# Patient Record
Sex: Female | Born: 1980 | Race: Black or African American | Hispanic: No | Marital: Married | Smoking: Current every day smoker
Health system: Southern US, Community
[De-identification: ages and names within clinical notes are randomized; demographics above are authoritative.]

## PROBLEM LIST (undated history)

## (undated) ENCOUNTER — Inpatient Hospital Stay (HOSPITAL_COMMUNITY): Payer: Self-pay

## (undated) DIAGNOSIS — Z5189 Encounter for other specified aftercare: Secondary | ICD-10-CM

## (undated) DIAGNOSIS — D563 Thalassemia minor: Secondary | ICD-10-CM

## (undated) DIAGNOSIS — O09529 Supervision of elderly multigravida, unspecified trimester: Secondary | ICD-10-CM

## (undated) DIAGNOSIS — J069 Acute upper respiratory infection, unspecified: Secondary | ICD-10-CM

## (undated) DIAGNOSIS — Z789 Other specified health status: Secondary | ICD-10-CM

## (undated) DIAGNOSIS — R002 Palpitations: Secondary | ICD-10-CM

## (undated) DIAGNOSIS — N390 Urinary tract infection, site not specified: Secondary | ICD-10-CM

## (undated) DIAGNOSIS — R000 Tachycardia, unspecified: Secondary | ICD-10-CM

## (undated) DIAGNOSIS — D649 Anemia, unspecified: Secondary | ICD-10-CM

## (undated) HISTORY — DX: Tachycardia, unspecified: R00.0

## (undated) HISTORY — DX: Palpitations: R00.2

## (undated) HISTORY — PX: FOOT SURGERY: SHX648

## (undated) HISTORY — DX: Urinary tract infection, site not specified: N39.0

---
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
## 1998-02-04 ENCOUNTER — Encounter: Admission: RE | Admit: 1998-02-04 | Discharge: 1998-02-04 | Payer: Self-pay | Admitting: Family Medicine

## 1998-02-05 ENCOUNTER — Encounter: Admission: RE | Admit: 1998-02-05 | Discharge: 1998-02-05 | Payer: Self-pay | Admitting: Family Medicine

## 1998-02-09 ENCOUNTER — Encounter: Admission: RE | Admit: 1998-02-09 | Discharge: 1998-02-09 | Payer: Self-pay | Admitting: Family Medicine

## 1998-02-18 ENCOUNTER — Inpatient Hospital Stay (HOSPITAL_COMMUNITY): Admission: AD | Admit: 1998-02-18 | Discharge: 1998-02-27 | Payer: Self-pay | Admitting: Obstetrics

## 1998-02-18 ENCOUNTER — Encounter: Admission: RE | Admit: 1998-02-18 | Discharge: 1998-02-18 | Payer: Self-pay | Admitting: Family Medicine

## 1998-03-04 ENCOUNTER — Encounter: Admission: RE | Admit: 1998-03-04 | Discharge: 1998-03-04 | Payer: Self-pay | Admitting: Family Medicine

## 1998-03-04 ENCOUNTER — Encounter: Admission: RE | Admit: 1998-03-04 | Discharge: 1998-06-02 | Payer: Self-pay | Admitting: Obstetrics & Gynecology

## 1998-04-17 ENCOUNTER — Encounter: Admission: RE | Admit: 1998-04-17 | Discharge: 1998-04-17 | Payer: Self-pay | Admitting: Family Medicine

## 1998-05-13 ENCOUNTER — Encounter: Admission: RE | Admit: 1998-05-13 | Discharge: 1998-05-13 | Payer: Self-pay | Admitting: Family Medicine

## 1998-06-03 ENCOUNTER — Encounter: Admission: RE | Admit: 1998-06-03 | Discharge: 1998-06-03 | Payer: Self-pay | Admitting: Family Medicine

## 1998-06-18 ENCOUNTER — Encounter: Admission: RE | Admit: 1998-06-18 | Discharge: 1998-06-18 | Payer: Self-pay | Admitting: Family Medicine

## 1998-07-20 ENCOUNTER — Encounter: Admission: RE | Admit: 1998-07-20 | Discharge: 1998-07-20 | Payer: Self-pay | Admitting: Family Medicine

## 1998-07-27 ENCOUNTER — Encounter: Admission: RE | Admit: 1998-07-27 | Discharge: 1998-07-27 | Payer: Self-pay | Admitting: Family Medicine

## 1998-07-29 ENCOUNTER — Encounter: Admission: RE | Admit: 1998-07-29 | Discharge: 1998-07-29 | Payer: Self-pay | Admitting: Family Medicine

## 1998-10-12 ENCOUNTER — Encounter: Admission: RE | Admit: 1998-10-12 | Discharge: 1998-10-12 | Payer: Self-pay | Admitting: Family Medicine

## 1998-12-01 ENCOUNTER — Encounter: Admission: RE | Admit: 1998-12-01 | Discharge: 1998-12-01 | Payer: Self-pay | Admitting: Family Medicine

## 1998-12-14 ENCOUNTER — Encounter: Admission: RE | Admit: 1998-12-14 | Discharge: 1998-12-14 | Payer: Self-pay | Admitting: Sports Medicine

## 1998-12-31 ENCOUNTER — Encounter: Admission: RE | Admit: 1998-12-31 | Discharge: 1998-12-31 | Payer: Self-pay | Admitting: Family Medicine

## 1999-01-07 ENCOUNTER — Encounter: Admission: RE | Admit: 1999-01-07 | Discharge: 1999-01-07 | Payer: Self-pay | Admitting: Family Medicine

## 1999-03-12 ENCOUNTER — Encounter: Admission: RE | Admit: 1999-03-12 | Discharge: 1999-03-12 | Payer: Self-pay | Admitting: Family Medicine

## 1999-03-22 ENCOUNTER — Encounter: Admission: RE | Admit: 1999-03-22 | Discharge: 1999-03-22 | Payer: Self-pay | Admitting: Family Medicine

## 1999-06-01 ENCOUNTER — Encounter: Admission: RE | Admit: 1999-06-01 | Discharge: 1999-06-01 | Payer: Self-pay | Admitting: Family Medicine

## 1999-06-11 ENCOUNTER — Encounter: Admission: RE | Admit: 1999-06-11 | Discharge: 1999-06-11 | Payer: Self-pay | Admitting: Family Medicine

## 1999-11-09 ENCOUNTER — Encounter: Admission: RE | Admit: 1999-11-09 | Discharge: 1999-11-09 | Payer: Self-pay | Admitting: Sports Medicine

## 1999-11-17 ENCOUNTER — Emergency Department (HOSPITAL_COMMUNITY): Admission: EM | Admit: 1999-11-17 | Discharge: 1999-11-17 | Payer: Self-pay | Admitting: Emergency Medicine

## 1999-12-06 ENCOUNTER — Emergency Department (HOSPITAL_COMMUNITY): Admission: EM | Admit: 1999-12-06 | Discharge: 1999-12-06 | Payer: Self-pay | Admitting: Emergency Medicine

## 2000-12-24 ENCOUNTER — Emergency Department (HOSPITAL_COMMUNITY): Admission: EM | Admit: 2000-12-24 | Discharge: 2000-12-24 | Payer: Self-pay | Admitting: Emergency Medicine

## 2000-12-25 ENCOUNTER — Encounter: Admission: RE | Admit: 2000-12-25 | Discharge: 2000-12-25 | Payer: Self-pay | Admitting: Family Medicine

## 2003-07-28 ENCOUNTER — Emergency Department (HOSPITAL_COMMUNITY): Admission: EM | Admit: 2003-07-28 | Discharge: 2003-07-28 | Payer: Self-pay | Admitting: Emergency Medicine

## 2004-04-04 ENCOUNTER — Inpatient Hospital Stay (HOSPITAL_COMMUNITY): Admission: AD | Admit: 2004-04-04 | Discharge: 2004-04-04 | Payer: Self-pay | Admitting: *Deleted

## 2004-04-09 ENCOUNTER — Encounter: Admission: RE | Admit: 2004-04-09 | Discharge: 2004-04-09 | Payer: Self-pay | Admitting: Sports Medicine

## 2004-04-12 ENCOUNTER — Encounter: Admission: RE | Admit: 2004-04-12 | Discharge: 2004-04-12 | Payer: Self-pay | Admitting: Family Medicine

## 2004-04-15 ENCOUNTER — Inpatient Hospital Stay (HOSPITAL_COMMUNITY): Admission: AD | Admit: 2004-04-15 | Discharge: 2004-04-15 | Payer: Self-pay | Admitting: Obstetrics & Gynecology

## 2004-04-26 ENCOUNTER — Inpatient Hospital Stay (HOSPITAL_COMMUNITY): Admission: AD | Admit: 2004-04-26 | Discharge: 2004-04-26 | Payer: Self-pay | Admitting: Obstetrics and Gynecology

## 2004-04-27 ENCOUNTER — Encounter: Admission: RE | Admit: 2004-04-27 | Discharge: 2004-04-27 | Payer: Self-pay | Admitting: Family Medicine

## 2004-05-04 ENCOUNTER — Encounter: Admission: RE | Admit: 2004-05-04 | Discharge: 2004-05-04 | Payer: Self-pay | Admitting: Sports Medicine

## 2004-05-04 ENCOUNTER — Encounter (INDEPENDENT_AMBULATORY_CARE_PROVIDER_SITE_OTHER): Payer: Self-pay | Admitting: Specialist

## 2004-06-02 ENCOUNTER — Encounter: Admission: RE | Admit: 2004-06-02 | Discharge: 2004-06-02 | Payer: Self-pay | Admitting: Family Medicine

## 2004-06-16 ENCOUNTER — Ambulatory Visit (HOSPITAL_COMMUNITY): Admission: RE | Admit: 2004-06-16 | Discharge: 2004-06-16 | Payer: Self-pay | Admitting: Family Medicine

## 2004-06-17 ENCOUNTER — Encounter: Admission: RE | Admit: 2004-06-17 | Discharge: 2004-06-17 | Payer: Self-pay | Admitting: Sports Medicine

## 2004-07-10 ENCOUNTER — Observation Stay (HOSPITAL_COMMUNITY): Admission: AD | Admit: 2004-07-10 | Discharge: 2004-07-11 | Payer: Self-pay | Admitting: Obstetrics and Gynecology

## 2004-07-10 ENCOUNTER — Ambulatory Visit: Payer: Self-pay | Admitting: Obstetrics and Gynecology

## 2004-07-14 ENCOUNTER — Ambulatory Visit: Payer: Self-pay | Admitting: Family Medicine

## 2004-08-03 ENCOUNTER — Inpatient Hospital Stay (HOSPITAL_COMMUNITY): Admission: AD | Admit: 2004-08-03 | Discharge: 2004-08-06 | Payer: Self-pay | Admitting: *Deleted

## 2004-08-03 ENCOUNTER — Encounter (INDEPENDENT_AMBULATORY_CARE_PROVIDER_SITE_OTHER): Payer: Self-pay | Admitting: Specialist

## 2004-09-15 ENCOUNTER — Ambulatory Visit: Payer: Self-pay | Admitting: Family Medicine

## 2004-09-16 ENCOUNTER — Ambulatory Visit (HOSPITAL_COMMUNITY): Admission: RE | Admit: 2004-09-16 | Discharge: 2004-09-16 | Payer: Self-pay | Admitting: Family Medicine

## 2004-10-08 ENCOUNTER — Ambulatory Visit: Payer: Self-pay | Admitting: Sports Medicine

## 2005-04-06 ENCOUNTER — Emergency Department (HOSPITAL_COMMUNITY): Admission: EM | Admit: 2005-04-06 | Discharge: 2005-04-06 | Payer: Self-pay | Admitting: Emergency Medicine

## 2005-04-29 ENCOUNTER — Ambulatory Visit: Payer: Self-pay | Admitting: Family Medicine

## 2005-05-10 ENCOUNTER — Emergency Department (HOSPITAL_COMMUNITY): Admission: EM | Admit: 2005-05-10 | Discharge: 2005-05-10 | Payer: Self-pay | Admitting: Emergency Medicine

## 2005-06-03 ENCOUNTER — Ambulatory Visit: Payer: Self-pay | Admitting: Family Medicine

## 2005-08-12 ENCOUNTER — Emergency Department (HOSPITAL_COMMUNITY): Admission: EM | Admit: 2005-08-12 | Discharge: 2005-08-12 | Payer: Self-pay | Admitting: Emergency Medicine

## 2005-08-16 ENCOUNTER — Ambulatory Visit: Payer: Self-pay | Admitting: Family Medicine

## 2006-01-10 ENCOUNTER — Emergency Department (HOSPITAL_COMMUNITY): Admission: EM | Admit: 2006-01-10 | Discharge: 2006-01-10 | Payer: Self-pay | Admitting: Family Medicine

## 2006-04-25 ENCOUNTER — Ambulatory Visit: Payer: Self-pay | Admitting: Family Medicine

## 2006-05-11 ENCOUNTER — Ambulatory Visit: Payer: Self-pay | Admitting: Family Medicine

## 2006-05-11 ENCOUNTER — Other Ambulatory Visit: Admission: RE | Admit: 2006-05-11 | Discharge: 2006-05-11 | Payer: Self-pay | Admitting: Family Medicine

## 2006-09-01 ENCOUNTER — Emergency Department (HOSPITAL_COMMUNITY): Admission: EM | Admit: 2006-09-01 | Discharge: 2006-09-01 | Payer: Self-pay | Admitting: Family Medicine

## 2006-10-27 ENCOUNTER — Emergency Department (HOSPITAL_COMMUNITY): Admission: EM | Admit: 2006-10-27 | Discharge: 2006-10-27 | Payer: Self-pay | Admitting: Emergency Medicine

## 2007-01-29 ENCOUNTER — Telehealth: Payer: Self-pay | Admitting: *Deleted

## 2007-01-29 ENCOUNTER — Emergency Department (HOSPITAL_COMMUNITY): Admission: EM | Admit: 2007-01-29 | Discharge: 2007-01-29 | Payer: Self-pay | Admitting: Emergency Medicine

## 2007-01-31 ENCOUNTER — Telehealth: Payer: Self-pay | Admitting: *Deleted

## 2007-02-04 ENCOUNTER — Emergency Department (HOSPITAL_COMMUNITY): Admission: EM | Admit: 2007-02-04 | Discharge: 2007-02-04 | Payer: Self-pay | Admitting: Family Medicine

## 2007-04-23 ENCOUNTER — Emergency Department (HOSPITAL_COMMUNITY): Admission: EM | Admit: 2007-04-23 | Discharge: 2007-04-23 | Payer: Self-pay | Admitting: Emergency Medicine

## 2007-07-05 ENCOUNTER — Telehealth: Payer: Self-pay | Admitting: *Deleted

## 2007-09-26 ENCOUNTER — Emergency Department (HOSPITAL_COMMUNITY): Admission: EM | Admit: 2007-09-26 | Discharge: 2007-09-27 | Payer: Self-pay | Admitting: Emergency Medicine

## 2007-10-04 ENCOUNTER — Encounter (INDEPENDENT_AMBULATORY_CARE_PROVIDER_SITE_OTHER): Payer: Self-pay | Admitting: Family Medicine

## 2007-10-04 ENCOUNTER — Ambulatory Visit: Payer: Self-pay | Admitting: Family Medicine

## 2007-10-04 LAB — CONVERTED CEMR LAB
Chlamydia, DNA Probe: NEGATIVE
GC Probe Amp, Genital: NEGATIVE
TSH: 0.406 microintl units/mL (ref 0.350–5.50)

## 2007-10-08 ENCOUNTER — Emergency Department (HOSPITAL_COMMUNITY): Admission: EM | Admit: 2007-10-08 | Discharge: 2007-10-08 | Payer: Self-pay | Admitting: Family Medicine

## 2007-10-10 ENCOUNTER — Ambulatory Visit: Payer: Self-pay | Admitting: Family Medicine

## 2007-10-10 ENCOUNTER — Encounter (INDEPENDENT_AMBULATORY_CARE_PROVIDER_SITE_OTHER): Payer: Self-pay | Admitting: Family Medicine

## 2007-10-10 LAB — CONVERTED CEMR LAB: Rapid Strep: NEGATIVE

## 2007-10-11 LAB — CONVERTED CEMR LAB
Cholesterol: 124 mg/dL (ref 0–200)
VLDL: 7 mg/dL (ref 0–40)

## 2007-10-13 ENCOUNTER — Encounter (INDEPENDENT_AMBULATORY_CARE_PROVIDER_SITE_OTHER): Payer: Self-pay | Admitting: Family Medicine

## 2007-10-14 ENCOUNTER — Emergency Department (HOSPITAL_COMMUNITY): Admission: EM | Admit: 2007-10-14 | Discharge: 2007-10-14 | Payer: Self-pay | Admitting: Emergency Medicine

## 2007-10-18 ENCOUNTER — Telehealth: Payer: Self-pay | Admitting: *Deleted

## 2007-10-19 ENCOUNTER — Ambulatory Visit: Payer: Self-pay | Admitting: Family Medicine

## 2007-10-19 DIAGNOSIS — J069 Acute upper respiratory infection, unspecified: Secondary | ICD-10-CM | POA: Insufficient documentation

## 2007-11-05 ENCOUNTER — Ambulatory Visit: Payer: Self-pay | Admitting: Sports Medicine

## 2007-11-05 LAB — CONVERTED CEMR LAB: Beta hcg, urine, semiquantitative: NEGATIVE

## 2008-03-17 ENCOUNTER — Telehealth: Payer: Self-pay | Admitting: *Deleted

## 2008-03-18 ENCOUNTER — Encounter (INDEPENDENT_AMBULATORY_CARE_PROVIDER_SITE_OTHER): Payer: Self-pay | Admitting: *Deleted

## 2008-03-18 ENCOUNTER — Ambulatory Visit: Payer: Self-pay | Admitting: Family Medicine

## 2008-03-18 DIAGNOSIS — R300 Dysuria: Secondary | ICD-10-CM | POA: Insufficient documentation

## 2008-03-18 LAB — CONVERTED CEMR LAB
Bilirubin Urine: NEGATIVE
Blood in Urine, dipstick: NEGATIVE
Glucose, Urine, Semiquant: NEGATIVE
Ketones, urine, test strip: NEGATIVE
Nitrite: NEGATIVE
Protein, U semiquant: NEGATIVE
Specific Gravity, Urine: 1.02
Urobilinogen, UA: 0.2
WBC Urine, dipstick: NEGATIVE
pH: 7

## 2008-03-27 ENCOUNTER — Emergency Department (HOSPITAL_COMMUNITY): Admission: EM | Admit: 2008-03-27 | Discharge: 2008-03-27 | Payer: Self-pay | Admitting: Family Medicine

## 2008-10-03 ENCOUNTER — Telehealth (INDEPENDENT_AMBULATORY_CARE_PROVIDER_SITE_OTHER): Payer: Self-pay | Admitting: *Deleted

## 2008-10-03 ENCOUNTER — Ambulatory Visit: Payer: Self-pay | Admitting: Family Medicine

## 2008-10-03 DIAGNOSIS — N939 Abnormal uterine and vaginal bleeding, unspecified: Secondary | ICD-10-CM

## 2008-10-03 DIAGNOSIS — R053 Chronic cough: Secondary | ICD-10-CM | POA: Insufficient documentation

## 2008-10-03 DIAGNOSIS — M545 Low back pain, unspecified: Secondary | ICD-10-CM | POA: Insufficient documentation

## 2008-10-03 DIAGNOSIS — R05 Cough: Secondary | ICD-10-CM | POA: Insufficient documentation

## 2008-10-03 DIAGNOSIS — N926 Irregular menstruation, unspecified: Secondary | ICD-10-CM | POA: Insufficient documentation

## 2008-10-03 LAB — CONVERTED CEMR LAB
Beta hcg, urine, semiquantitative: NEGATIVE
Bilirubin Urine: NEGATIVE
Blood in Urine, dipstick: NEGATIVE
Glucose, Urine, Semiquant: NEGATIVE
Ketones, urine, test strip: NEGATIVE
Nitrite: NEGATIVE
Protein, U semiquant: NEGATIVE
Specific Gravity, Urine: 1.01
Urobilinogen, UA: 0.2
WBC Urine, dipstick: NEGATIVE
pH: 7.5

## 2008-12-04 ENCOUNTER — Telehealth (INDEPENDENT_AMBULATORY_CARE_PROVIDER_SITE_OTHER): Payer: Self-pay | Admitting: Family Medicine

## 2008-12-08 ENCOUNTER — Ambulatory Visit: Payer: Self-pay | Admitting: Family Medicine

## 2008-12-08 DIAGNOSIS — R0982 Postnasal drip: Secondary | ICD-10-CM | POA: Insufficient documentation

## 2008-12-25 ENCOUNTER — Other Ambulatory Visit: Admission: RE | Admit: 2008-12-25 | Discharge: 2008-12-25 | Payer: Self-pay | Admitting: Family Medicine

## 2008-12-25 ENCOUNTER — Encounter (INDEPENDENT_AMBULATORY_CARE_PROVIDER_SITE_OTHER): Payer: Self-pay | Admitting: Family Medicine

## 2008-12-25 ENCOUNTER — Ambulatory Visit: Payer: Self-pay | Admitting: Family Medicine

## 2008-12-26 LAB — CONVERTED CEMR LAB
BUN: 11 mg/dL (ref 6–23)
CO2: 24 meq/L (ref 19–32)
Calcium: 9.6 mg/dL (ref 8.4–10.5)
Chlamydia, DNA Probe: NEGATIVE
Chloride: 105 meq/L (ref 96–112)
Creatinine, Ser: 0.81 mg/dL (ref 0.40–1.20)
GC Probe Amp, Genital: NEGATIVE
Glucose, Bld: 84 mg/dL (ref 70–99)
HCT: 40 % (ref 36.0–46.0)
Hemoglobin: 13.5 g/dL (ref 12.0–15.0)
MCHC: 33.8 g/dL (ref 30.0–36.0)
MCV: 83.7 fL (ref 78.0–100.0)
Platelets: 214 10*3/uL (ref 150–400)
Potassium: 4 meq/L (ref 3.5–5.3)
RBC: 4.78 M/uL (ref 3.87–5.11)
RDW: 13.6 % (ref 11.5–15.5)
Sodium: 139 meq/L (ref 135–145)
WBC: 4.7 10*3/uL (ref 4.0–10.5)

## 2008-12-29 ENCOUNTER — Encounter (INDEPENDENT_AMBULATORY_CARE_PROVIDER_SITE_OTHER): Payer: Self-pay | Admitting: Family Medicine

## 2009-04-27 ENCOUNTER — Telehealth: Payer: Self-pay | Admitting: Family Medicine

## 2009-04-28 ENCOUNTER — Ambulatory Visit: Payer: Self-pay | Admitting: Family Medicine

## 2009-04-28 DIAGNOSIS — L255 Unspecified contact dermatitis due to plants, except food: Secondary | ICD-10-CM | POA: Insufficient documentation

## 2009-07-27 ENCOUNTER — Ambulatory Visit: Payer: Self-pay | Admitting: Family Medicine

## 2009-07-27 DIAGNOSIS — G43809 Other migraine, not intractable, without status migrainosus: Secondary | ICD-10-CM | POA: Insufficient documentation

## 2009-11-02 ENCOUNTER — Encounter (INDEPENDENT_AMBULATORY_CARE_PROVIDER_SITE_OTHER): Payer: Self-pay | Admitting: *Deleted

## 2009-11-02 DIAGNOSIS — F17200 Nicotine dependence, unspecified, uncomplicated: Secondary | ICD-10-CM

## 2010-03-30 ENCOUNTER — Ambulatory Visit: Payer: Self-pay | Admitting: Family Medicine

## 2010-03-30 DIAGNOSIS — J029 Acute pharyngitis, unspecified: Secondary | ICD-10-CM | POA: Insufficient documentation

## 2010-03-30 LAB — CONVERTED CEMR LAB: Rapid Strep: NEGATIVE

## 2010-11-30 NOTE — Assessment & Plan Note (Signed)
Summary: sore throat/headache,df   Vital Signs:  Patient profile:   30 year old female Height:      64 inches Weight:      110.6 pounds BMI:     19.05 Temp:     98.2 degrees F oral Pulse rate:   83 / minute BP sitting:   102 / 70  (left arm) Cuff size:   regular  Vitals Entered By: Gladstone Pih (Mar 30, 2010 2:25 PM) CC: C/O sore throat and HA X 1 day Is Patient Diabetic? No Pain Assessment Patient in pain? no        Primary Care Provider:  Asher Muir MD  CC:  C/O sore throat and HA X 1 day.  History of Present Illness: here with sore throat, HA, sneezing for 2 days.  no myalgias.  no fever.  no cough  Habits & Providers  Alcohol-Tobacco-Diet     Tobacco Status: current     Tobacco Counseling: to quit use of tobacco products     Cigarette Packs/Day: 0.25  Current Medications (verified): 1)  Triamcinolone Acetonide 0.1 % Oint (Triamcinolone Acetonide) .... Apply To Rash Two Times A Day Until It Clears  Allergies (verified): No Known Drug Allergies  Social History: Packs/Day:  0.25  Review of Systems       see HPI  Physical Exam  General:  Well-developed,well-nourished,in no acute distress; alert,appropriate and cooperative throughout examination Ears:  TMs dull Nose:  clear nasal discharge Lungs:  Normal respiratory effort, chest expands symmetrically. Lungs are clear to auscultation, no crackles or wheezes. Heart:  Normal rate and regular rhythm. S1 and S2 normal without gallop, murmur, click, rub or other extra sounds. Abdomen:  Bowel sounds positive,abdomen soft and non-tender without masses, organomegaly or hernias noted.   Impression & Recommendations:  Problem # 1:  VIRAL URI (ICD-465.9) think this is likely a virus.  recommended OTC cough and cold products, return if not improved, fever develops Orders: [facility]- Est Level  3 (16109)  Complete Medication List: 1)  Triamcinolone Acetonide 0.1 % Oint (Triamcinolone acetonide) .... Apply to  rash two times a day until it clears  Other Orders: Rapid Strep-[facility] (60454)  Patient Instructions: 1)  It was nice to meet you today 2)  I think that you have a virus, which is not able to be treated with antibiotics.  Try over the counter cough and cold medicines to help with this.  Try a nighttime medicine to help with throat symptoms at night.  Be careful not to take extra tylenol (acetominophen) with medicines that already have this in it.   Laboratory Results  Date/Time Received: Mar 30, 2010 2:52 PM  Date/Time Reported: Mar 30, 2010 3:01 PM   Other Tests  Rapid Strep: negative Comments: ...........test performed by...........Marland KitchenTerese Door, CMA

## 2010-11-30 NOTE — Miscellaneous (Signed)
Summary: Tobacco Rochelle Larue  Clinical Lists Changes  Problems: Added new problem of TOBACCO Gavan Nordby (ICD-305.1) 

## 2011-01-07 ENCOUNTER — Encounter: Payer: Self-pay | Admitting: Family Medicine

## 2011-01-07 ENCOUNTER — Ambulatory Visit (INDEPENDENT_AMBULATORY_CARE_PROVIDER_SITE_OTHER): Payer: Medicaid Other | Admitting: Family Medicine

## 2011-01-07 VITALS — BP 109/72 | HR 86 | Temp 97.8°F | Wt 113.4 lb

## 2011-01-07 DIAGNOSIS — R21 Rash and other nonspecific skin eruption: Secondary | ICD-10-CM
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
## 2011-01-16 ENCOUNTER — Encounter: Payer: Self-pay | Admitting: Family Medicine
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
## 2011-03-15 ENCOUNTER — Ambulatory Visit (INDEPENDENT_AMBULATORY_CARE_PROVIDER_SITE_OTHER): Payer: Medicaid Other | Admitting: Family Medicine

## 2011-03-15 ENCOUNTER — Encounter: Payer: Self-pay | Admitting: Family Medicine

## 2011-03-15 VITALS — BP 100/66 | HR 80 | Temp 97.9°F | Ht 64.0 in | Wt 111.0 lb

## 2011-03-15 DIAGNOSIS — Z209 Contact with and (suspected) exposure to unspecified communicable disease: Secondary | ICD-10-CM | POA: Insufficient documentation

## 2011-03-15 DIAGNOSIS — Z01419 Encounter for gynecological examination (general) (routine) without abnormal findings: Secondary | ICD-10-CM

## 2011-03-15 DIAGNOSIS — F17200 Nicotine dependence, unspecified, uncomplicated: Secondary | ICD-10-CM

## 2011-03-15 DIAGNOSIS — Z0000 Encounter for general adult medical examination without abnormal findings: Secondary | ICD-10-CM

## 2011-03-15 DIAGNOSIS — R5381 Other malaise: Secondary | ICD-10-CM

## 2011-03-15 DIAGNOSIS — R5383 Other fatigue: Secondary | ICD-10-CM | POA: Insufficient documentation

## 2011-03-15 LAB — CBC
HCT: 39.6 % (ref 36.0–46.0)
MCH: 27.5 pg (ref 26.0–34.0)
MCHC: 32.8 g/dL (ref 30.0–36.0)
MCV: 83.7 fL (ref 78.0–100.0)
Platelets: 224 10*3/uL (ref 150–400)
RBC: 4.73 MIL/uL (ref 3.87–5.11)
RDW: 13.6 % (ref 11.5–15.5)
WBC: 4.8 10*3/uL (ref 4.0–10.5)

## 2011-03-15 LAB — TSH: TSH: 0.301 u[IU]/mL — ABNORMAL LOW (ref 0.350–4.500)

## 2011-03-15 NOTE — Assessment & Plan Note (Signed)
Patient not willing to discuss cessation today. Will f/u when ready.

## 2011-03-15 NOTE — Patient Instructions (Addendum)
Nice to meet you. I will call you if your labs are not normal. You can try taking ibuprofen up to 600mg  for your back pain.  Heating pads may also help as this is likely muscle pain. Make an appointment for pap in next one year. You may also think about mirena and call if you are interested.  Contraceptive Devices (IUDs) IUD stands for intrauterine device. Intrauterine means inside the womb (uterus). The purpose of the IUD is to prevent pregnancy. IUDs make it more difficult for your partner's sperm to get into your womb and into your fallopian tubes, where the eggs are fertilized. IUDs also alter the secretions of your cervix, which make it a stronger sperm barrier. They also affect the lining of the womb, so it is harder for an egg to implant. The IUD does not cause an abortion. There are 2 types of IUDs available:  Copper IUD gives off a small amount of copper inside the uterus. This prevents the sperm from going through the uterus, up into the fallopian tube, where the egg is fertilized. The copper IUD can also damage or prevent the fertilized egg from attaching on the inside lining of the uterus. It can stay in place for 10 years. The copper IUD can be used as an emergency contraceptive, if inserted within 5 days after having unprotected sexual intercourse.   Hormone IUD contains progestin (synthetic progesterone), and it releases this hormone into the uterus. The hormone thickens the mucus on the cervix and prevents sperm from entering the uterus. It also weakens the sperm that get into the uterus, so that the sperm and egg cannot live in the fallopian tube. It also makes the inside lining of the uterus thinner, which makes it difficult for a fertilized egg to attach to the uterus. The hormone progestin in the IUD decreases the amount of bleeding during a menstrual period and can be helpful in women who have heavy menstrual periods. The hormone IUD can stay in place for 5 years.  SIDE EFFECTS OF  THE IUD  There may be more cramping or pain with periods.   It may cause heavier, longer periods, which can cause lack of red blood cells (anemia) and can interfere with your daily and sexual activities.  This method of birth control is not usually the best choice for a woman with heavy or prolonged periods. The birth control pill may be a better choice. IUDs work best for women who have already had a pregnancy, because the cervix is more open, making the insertion of the device easier and less painful. However, many women without children use the IUD. One of the main goals of patient selection is to prevent unintentionally inserting an IUD into a patient who has an STD (sexually transmitted disease), who is at high risk of exposure to an STD, or who is already pregnant. That is why the IUD is inserted during, or right after, a menstrual period. REASONS NOT TO USE AN IUD  The womb or cervix is not shaped normally.   You have or have had a pelvic infection, such as an STD, in the past 3 months.   You have or suspect cancer in the female organs.   You have an abnormal Pap smear.   You have certain liver diseases.   There is severe infection or inflammation of the cervix (cervicitis).   You have unexplained vaginal bleeding.   You have heart valve problems (unless a heart specialist advises otherwise).  You are allergic to copper (rare).   You previously had a pregnancy outside the uterus (ectopic).   You are pregnant or suspect you are pregnant.   You have prolonged or heavy periods, or heavy pain or cramping with periods (except for the hormone IUD).   You have or suspect pelvic cancer.   You have an STD.   The cervix or uterus has problems (cervical stenosis, fibroids in the uterus) making it difficult to insert the IUD.  IUDs should be removed when a woman becomes menopausal or pregnant. BENEFITS  You are always ready to have sexual intercourse.   The copper IUD does not  interfere with your female hormones.   The copper IUD can be used as emergency contraception.   An IUD can be used while nursing.   It works immediately after insertion, and there is no problem getting pregnant when it is removed.   It does not interfere with foreplay.   The progesterone IUD can make heavy menstrual periods lighter.   The progesterone IUD can be used for 5 years.   The copper IUD can be used for 10 years.  RISKS  Putting the IUD through the uterus, into the pelvis or abdomen (perforation of the uterus).   Losing the IUD (expulsion). This is more common in women who never had children.   When pregnant with an IUD, there is an increased chance of an infection and loss of the pregnancy.   Pregnancy in the fallopian tube (ectopic).   STD, in women who have more than 1 sex partner. The IUD does not protect against STDs.   Other minor side effects may include:   Headaches.   Feeling sick to your stomach (nausea).   Breast tenderness.   Depression.  PROCEDURE  The IUD is inserted during or right after a menstrual period, to make sure you are not pregnant.   You will be given a consent form to read about the IUD, how it works, how it is inserted, and the side effects.   You will lie on an exam table, naked from the waist down.   Your caregiver will do an exam to determine the size and position of your uterus.   Usually, an anesthetic is not needed.   Your caregiver may give you a pain pill to take, 1 or 2 hours before the procedure.   Sometimes, a paracervical block may be used to block and control any discomfort with insertion.   A tool (speculum) is then placed in your vagina (birth canal) so your caregiver can see the cervix.   A sound is sent into the uterus to check the depth of the uterus.   A slim instrument (IUD inserter), which is shaped like a drinking straw, is inserted through the small opening in your cervix and into your uterus.   Then,  the IUD is pushed in with a plunger, much like a syringe, and the inserter is removed. There may be some cramping and pain during the insertion.   Relaxing helps to lessen the discomfort.   Following the procedure, you will usually spot blood. Have some pads with you. Avoid using tampons for 2 weeks. Bleeding after the procedure is normal. It varies from light spotting for a few days to menstrual-like bleeding for up to 3 weeks. It may be like a period if it is near the time for your normal period.   You may also have mild cramping. Only take over-the-counter or prescription medicines  for pain, discomfort, or fever as directed by your caregiver. Do not use aspirin, as this may increase bleeding.   Practice checking the string coming out of the cervix, to make sure the IUD is always in the uterus.  HOME CARE INSTRUCTIONS  Do not drive for 24 hours.   Only take over-the-counter or prescription medicines for pain, discomfort, or fever as directed by your caregiver.   No tampons, douching, or sexual intercourse for 2 weeks, or until your caregiver approves.   Rest at home for 24 hours. You may then resume normal activities, unless told otherwise by your caregiver.   Check your IUD prior to resuming sexual activity, to make sure it is in place. Make sure that you can feel the strings. An IUD can be pushed out and lost without the user even knowing it is gone. Also, if the strings are getting longer, it may mean that the IUD is being forced out of the uterus. This means it is no longer offering full protection from pregnancy.   Take any medications your caregiver has ordered, as directed.   Make sure to keep your recheck appointment, so your caregiver can make sure your IUD has remained in place. After that exam, yearly exams are advised, unless you cannot feel the strings of your IUD.   Check that the IUD is still in place by feeling for the strings after every menstrual period.  SEEK MEDICAL  CARE IF:  Bleeding is heavier than a normal menstrual cycle.   You have an oral temperature above 101.   You have increasing cramps or pains, not relieved with medicine. Or you develop belly (abdominal) pain that does not seem to be related to the same area of earlier cramping and pain.   You are lightheaded, unusually weak, or have fainting episodes.   You develop pain in the tops of your shoulders (shoulder strap areas).   You are having problems or questions, which have not been answered well enough by your caregiver.   You develop abdominal pain.   You have pain during sexual intercourse.   You cannot feel the IUD strings.   You have abnormal vaginal discharge.   You feel the IUD at the opening of the cervix in the vagina.   You think you are pregnant.   You miss your menstrual period.   The IUD string is hurting your sex partner.   The IUD string has gotten longer.  Document Released: 09/20/2004 Document Re-Released: 01/11/2010 [facility] Patient Information 2011 [place], [place].

## 2011-03-15 NOTE — Assessment & Plan Note (Signed)
Normal PAP in 2010. Will start screening q2-3 years. Patient began menses today, will defer until next CPE as patient is low risk. Check GC, Chl, and HIV tests today. Discussed contraception including IUD which may have extra benefit of decreasing menstrual blood loss. Pt to make f/u appt if interested.

## 2011-03-15 NOTE — Progress Notes (Signed)
  Subjective:    Patient ID: Pam Vargas, female    DOB: 07-01-1981, 30 y.o.   MRN: [id number]  HPI 1. Health maintenance. Last had normal pap in 2010, denies ever having abnormal result. Regular menstrual periods but bleeding has been a little heavier lately, lasts 4-5 days. Not using BC. Requests testing for STDs today.   2. Pain in shoulder/neck. Recently started working as Advertising copywriter. Intermittent pain worsened by certain upper ext movements. Worst in right shoulder blade. Aleve does not help. Not currently in pain.  3. Fatigue. Patient states this is new since her new job started. This problem was also noted in 2010 after review of records. Does not interfere with her work of level of functioning.   Review of Systems Denies fever, weight changes, menstrual irregularities.     Objective:   Physical Exam  Vitals reviewed. Constitutional: She is oriented to person, place, and time. She appears well-developed and well-nourished. No distress.  HENT:  Head: Normocephalic and atraumatic.  Eyes: EOM are normal. Pupils are equal, round, and reactive to light.  Cardiovascular: Normal rate, regular rhythm and normal heart sounds.  Exam reveals no gallop.   No murmur heard. Pulmonary/Chest: Effort normal and breath sounds normal. No respiratory distress. She has no wheezes. She has no rales.  Genitourinary:       Breasts have diffuse glandular tissue, no masses palpated. (Note: Menses began today)  Musculoskeletal: She exhibits no edema.       Mild TTP over right rhomboid distribution. No vertebral TTP.   Neurological: She is alert and oriented to person, place, and time.  Psychiatric: She has a normal mood and affect.          Assessment & Plan:

## 2011-03-15 NOTE — Assessment & Plan Note (Signed)
Will check screening CBC and TSH today. Possibly related to increase in menstrual bleeding vs working more hours.

## 2011-03-16 ENCOUNTER — Encounter: Payer: Self-pay | Admitting: Family Medicine

## 2011-03-16 LAB — HIV ANTIBODY (ROUTINE TESTING W REFLEX): HIV: NONREACTIVE

## 2011-03-16 LAB — GC/CHLAMYDIA PROBE AMP, URINE
Chlamydia, Swab/Urine, PCR: NEGATIVE
GC Probe Amp, Urine: NEGATIVE

## 2011-03-18 NOTE — Consult Note (Signed)
NAME:  Pam Vargas, Pam Vargas                         ACCOUNT NO.:  [id number]   MEDICAL RECORD NO.:  [id number]                   PATIENT TYPE:  OBV   LOCATION:  9372                                 FACILITY:  [facility]   PHYSICIAN:  Vida Roller, M.D.                DATE OF BIRTH:  1980-11-24   DATE OF CONSULTATION:  DATE OF DISCHARGE:                                   CONSULTATION   HISTORY OF PRESENT ILLNESS:  The patient is a 30 year old African American  female with a complaint of palpitations this morning at around 9 a.m.  The  patient is a Advertising copywriter at [employer].  She was doing her usual  work when she experienced palpitations associated with shortness of breath,  dizziness and diaphoresis.  The patient felt that she was going to pass out,  but sat down and felt a little better.  She went to [facility].  She  was having episodes of palpitations off and on.  She was transferred to  [facility] for further evaluation.  The patient is [redacted] weeks pregnant with no  complications.  This is her second pregnancy.   ALLERGIES:  No known allergies.   PAST MEDICAL HISTORY:  No significant history.   MEDICATIONS:  The patient is on prenatal vitamins.   FAMILY HISTORY:  Her mother has diabetes mellitus diagnosed recently.  Her  father and sister have heart murmur never worked up.   SOCIAL HISTORY:  The patient smokes three to six cigarettes a day for eight  years.  Quit two months ago.   OBSTETRICAL HISTORY:  Gravida 2, para 1.  The first delivery was a C-section  six years ago, a healthy son.  This is her second pregnancy.   PHYSICAL EXAMINATION:  VITAL SIGNS:  Temperature 98.3 degrees, pulse 111,  respiratory rate 18, blood pressure 94/46, saturating 98% on 2 L.  GENERAL APPEARANCE:  The patient is lying comfortably in bed in no distress.  HEENT:  Normal.  No bruits.  No JVD.  No thyromegaly.  RESPIRATORY:  Clear.  No rales or rhonchi.  CARDIOVASCULAR:  S1 and S2 normal.  No  murmur.  No gallops.  Tachycardia.  ABDOMEN:  Soft and nontender.  Uterus 20 weeks size.  Bowel sounds present.  Fetal heart rate present.  EXTREMITIES:  No edema.  Pulses +2.   LABORATORIES ON ADMISSION:  Hemoglobin 10.9, hematocrit 32.5, WBC 8.4, MCV  77.8.  Sodium 139, potassium 3.3, chloride 105, bicarbonate 24.5, BUN 4,  creatinine 0.7, glucose 19.  EKG showed atrial fibrillation with no P waves  seen in V1, 2 and 3.  The rate was between 140-150.  On presentation, the  patient spontaneously converted to sinus rhythm.  This was thought to be  supraventricular tachycardia mostly AV nodal reentrant or AVV  entrant.  No  definite cause found.  The patient is being investigated with TSH for  thyroid problems, 2-D echocardiogram to rule out any valvular heart disease  and serial cardiac enzymes to rule out any ischemic injury to the heart.  Her potassium is 3.3, which is being repleted.  The hemoglobin is 10.9.   ASSESSMENT:  Mostly seems to be iron deficiency anemia in the picture of  pregnancy.  The patient is being admitted for 24-hour observation at [facility].  If no episode of palpitation, the patient can be discharged  tomorrow morning.  Will get an echocardiogram as an outpatient at [facility] on Monday.     Adrian Blackwater, MD                  Vida Roller, M.D.    IM/MEDQ  D:  07/10/2004  T:  07/11/2004  Job:  [id number]

## 2011-06-02 ENCOUNTER — Encounter: Payer: Self-pay | Admitting: Family Medicine

## 2011-06-02 ENCOUNTER — Other Ambulatory Visit (HOSPITAL_COMMUNITY)
Admission: RE | Admit: 2011-06-02 | Discharge: 2011-06-02 | Disposition: A | Discharge status: Home / Self Care | Payer: Medicaid Other | Source: Ambulatory Visit | Attending: Family Medicine | Admitting: Family Medicine

## 2011-06-02 ENCOUNTER — Ambulatory Visit (INDEPENDENT_AMBULATORY_CARE_PROVIDER_SITE_OTHER): Payer: Medicaid Other | Admitting: Family Medicine

## 2011-06-02 VITALS — BP 104/73 | HR 79 | Temp 97.0°F | Ht 64.0 in

## 2011-06-02 DIAGNOSIS — Z309 Encounter for contraceptive management, unspecified: Secondary | ICD-10-CM

## 2011-06-02 DIAGNOSIS — Z124 Encounter for screening for malignant neoplasm of cervix: Secondary | ICD-10-CM

## 2011-06-02 DIAGNOSIS — Z01419 Encounter for gynecological examination (general) (routine) without abnormal findings: Secondary | ICD-10-CM | POA: Insufficient documentation

## 2011-06-02 DIAGNOSIS — F17200 Nicotine dependence, unspecified, uncomplicated: Secondary | ICD-10-CM

## 2011-06-02 DIAGNOSIS — Z202 Contact with and (suspected) exposure to infections with a predominantly sexual mode of transmission: Secondary | ICD-10-CM

## 2011-06-02 LAB — POCT URINE PREGNANCY: Preg Test, Ur: NEGATIVE

## 2011-06-02 MED ORDER — MEDROXYPROGESTERONE ACETATE 150 MG/ML IM SUSP
150.0000 mg | Freq: Once | INTRAMUSCULAR | Status: AC
  Administered 2011-06-02: 150 mg via INTRAMUSCULAR

## 2011-06-02 NOTE — Patient Instructions (Addendum)
Please come back in November for a lab appt to check your thyroid. You might want to think about the Mirena IUD.   You might want to call the QUITLINE 1800 QUITNOW for help with the smoking.

## 2011-06-03 DIAGNOSIS — Z309 Encounter for contraceptive management, unspecified: Secondary | ICD-10-CM | POA: Insufficient documentation

## 2011-06-03 DIAGNOSIS — Z113 Encounter for screening for infections with a predominantly sexual mode of transmission: Secondary | ICD-10-CM | POA: Insufficient documentation

## 2011-06-03 LAB — GC/CHLAMYDIA PROBE AMP, GENITAL
Chlamydia, DNA Probe: NEGATIVE
GC Probe Amp, Genital: NEGATIVE

## 2011-06-03 NOTE — Assessment & Plan Note (Signed)
Cessation discussed.  Quitline phone number given.  Follow up to discuss further.

## 2011-06-03 NOTE — Assessment & Plan Note (Signed)
Reviewed options.  She is considering the IUD, but is currently sexually active without contraception.  Last intercourse > 5 days ago.  Interested in Depo today and will consider IUD further.

## 2011-06-03 NOTE — Progress Notes (Signed)
  Subjective:    Patient ID: Pam Vargas, female    DOB: Nov 05, 1980, 30 y.o.   MRN: [id number]  HPI  30 you here for just pap.  All paps normal - last pap was in 2010.  Pt unaware of possibility of spacing to q 3 years at this point.  She also requests STI testing.  She is monogamous with her female partner, but just likes to be sure.  No contraception.  Does not desire pregnancy at this time, perhaps in the future. No discharge or pain.  Normal menses. Down to 4-5 cigs per day (from 7 cigs/day) .  Feels stress is trigger for smoking.  Would like to quit if stress can be managed.    Review of Systems See HPI     Objective:   Physical Exam  Nursing note and vitals reviewed. Constitutional: She appears well-developed and well-nourished. No distress.  Genitourinary: Vagina normal and uterus normal. No vaginal discharge found.       Nl external female genitalia without lesions.  Nl vagina, well rugated, no discharge.  No cervical discharge or lesions.  Bimanual: small anterior uterus. No CMT.  No adnexal mass or TPP.  Skin: She is not diaphoretic.          Assessment & Plan:

## 2011-06-27 ENCOUNTER — Encounter: Payer: Self-pay | Admitting: Family Medicine

## 2011-08-08 LAB — POCT URINALYSIS DIP (DEVICE)
Glucose, UA: NEGATIVE
Ketones, ur: 15 — AB
Nitrite: NEGATIVE
Operator id: 235561
Protein, ur: 100 — AB
Specific Gravity, Urine: 1.025
Urobilinogen, UA: 1
pH: 6

## 2011-08-08 LAB — POCT PREGNANCY, URINE
Operator id: 235561
Preg Test, Ur: NEGATIVE

## 2011-08-08 LAB — URINE CULTURE: Colony Count: 60000

## 2011-08-08 LAB — GC/CHLAMYDIA PROBE AMP, GENITAL
Chlamydia, DNA Probe: NEGATIVE
GC Probe Amp, Genital: NEGATIVE

## 2011-08-08 LAB — WET PREP, GENITAL
Clue Cells Wet Prep HPF POC: NONE SEEN
Trich, Wet Prep: NONE SEEN
Yeast Wet Prep HPF POC: NONE SEEN

## 2011-08-08 LAB — POCT RAPID STREP A: Streptococcus, Group A Screen (Direct): NEGATIVE

## 2011-08-09 LAB — RAPID STREP SCREEN (MED CTR MEBANE ONLY): Streptococcus, Group A Screen (Direct): NEGATIVE

## 2011-08-17 LAB — POCT PREGNANCY, URINE
Operator id: 235561
Preg Test, Ur: NEGATIVE

## 2011-08-17 LAB — POCT URINALYSIS DIP (DEVICE)
Hgb urine dipstick: NEGATIVE
Ketones, ur: NEGATIVE
Operator id: 235561
Protein, ur: NEGATIVE
Specific Gravity, Urine: 1.025
pH: 7.5

## 2011-08-17 LAB — WET PREP, GENITAL
Clue Cells Wet Prep HPF POC: NONE SEEN
Trich, Wet Prep: NONE SEEN

## 2011-08-17 LAB — GC/CHLAMYDIA PROBE AMP, GENITAL: GC Probe Amp, Genital: NEGATIVE

## 2011-09-27 ENCOUNTER — Emergency Department (INDEPENDENT_AMBULATORY_CARE_PROVIDER_SITE_OTHER)
Admission: EM | Admit: 2011-09-27 | Discharge: 2011-09-27 | Disposition: A | Discharge status: Home / Self Care | Payer: Medicaid Other | Source: Home / Self Care | Attending: Emergency Medicine | Admitting: Emergency Medicine

## 2011-09-27 ENCOUNTER — Encounter (HOSPITAL_COMMUNITY): Payer: Self-pay | Admitting: Emergency Medicine

## 2011-09-27 DIAGNOSIS — IMO0002 Reserved for concepts with insufficient information to code with codable children: Secondary | ICD-10-CM

## 2011-09-27 MED ORDER — MELOXICAM 7.5 MG PO TABS: 7.5000 mg | ORAL_TABLET | Freq: Every day | ORAL | Status: AC

## 2011-09-27 MED ORDER — CYCLOBENZAPRINE HCL 10 MG PO TABS: 10.0000 mg | ORAL_TABLET | Freq: Three times a day (TID) | ORAL | Status: AC | PRN

## 2011-09-27 NOTE — ED Provider Notes (Signed)
History     CSN: [id number] Arrival date & time: 09/27/2011  1:16 PM   First MD Initiated Contact with Patient 09/27/11 1306      Chief Complaint  Patient presents with  . 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  Patient is a 30 y.o. female presenting with back pain. The history is provided by the patient.  Back Pain  This is a new problem. The problem occurs constantly. The problem has not changed since onset.The pain is associated with twisting. The pain is present in the lumbar spine. The quality of the pain is described as aching. The pain does not radiate. The pain is at a severity of 5/10. Pertinent negatives include no fever, no numbness, no abdominal pain, no abdominal swelling, no bowel incontinence, no bladder incontinence, no dysuria, no pelvic pain, no paresthesias, no paresis, no tingling and no weakness. The treatment provided no relief.    History reviewed. No pertinent past medical history.  Past Surgical History  Procedure Date  . Foot surgery   . Cesarean section   . Foot surgery     Family History  Problem Relation Age of Onset  . Diabetes Mother   . Anemia Mother   . Anemia Sister     History  Substance Use Topics  . Smoking status: Current Everyday Smoker -- 0.3 packs/day    Types: Cigarettes  . Smokeless tobacco: Not on file  . Alcohol Use: Yes     rare    OB History    Grav Para Term Preterm Abortions TAB SAB Ect Mult Living                  Review of Systems  Constitutional: Negative.  Negative for fever and fatigue.  Gastrointestinal: Negative for abdominal pain and bowel incontinence.  Genitourinary:  Negative.  Negative for bladder incontinence, dysuria and pelvic pain.  Musculoskeletal: Positive for back pain.  Neurological: Negative for tingling, weakness, numbness and paresthesias.    Allergies  Review of patient's allergies indicates no known allergies.  Home Medications   Current Outpatient Rx  Name Route Sig Dispense Refill  . ASPIRIN 500 MG PO TABS Oral Take 500 mg by mouth every 6 (six) hours as needed.        BP 111/60  Pulse 79  Temp(Src) 98.6 F (37 C) (Oral)  Resp 16  SpO2 100%  Physical Exam  Nursing note and vitals reviewed. Constitutional: She appears well-developed and well-nourished.  Abdominal: Soft.  Musculoskeletal:       Lumbar back: She exhibits decreased range of motion and tenderness. She exhibits no bony tenderness, no swelling, no edema, no deformity, no laceration, no pain, no spasm and normal pulse.       Back:  Neurological: She is alert. No cranial nerve deficit. She exhibits normal muscle tone. Coordination normal.  Skin: Skin is warm. No rash noted.    ED Course  Procedures (including critical care time)  Labs Reviewed - No data to display No results found.   No diagnosis found.    MDM  Lumbar strain, No neurological symptoms and no direct trauma.        Jimmie Molly, MD 09/27/11 [phone]

## 2011-10-01 ENCOUNTER — Encounter (HOSPITAL_COMMUNITY): Payer: Self-pay | Admitting: Emergency Medicine

## 2011-12-17 ENCOUNTER — Telehealth: Payer: Self-pay | Admitting: Family Medicine

## 2011-12-17 NOTE — Telephone Encounter (Signed)
Patient complains of HA, runny nose, sore throat.  No cough or fever.  Started this morning - both kids are sick with colds.  Advised patient to take Tylenol for cold, OTC decongestants and/or nasal spray, and cough drops to relieve symptoms.  Recommended plenty of sleep and fluids, chamomille tea with honey at bedtime.  If she develops fever, vomiting, decreased appetite, advised patient to call back or go to Urgent Care.

## 2011-12-18 ENCOUNTER — Emergency Department (HOSPITAL_COMMUNITY)
Admission: EM | Admit: 2011-12-18 | Discharge: 2011-12-18 | Disposition: A | Discharge status: Home / Self Care | Payer: Medicaid Other | Source: Home / Self Care

## 2011-12-18 ENCOUNTER — Encounter (HOSPITAL_COMMUNITY): Payer: Self-pay | Admitting: *Deleted

## 2011-12-18 DIAGNOSIS — T7840XA Allergy, unspecified, initial encounter: Secondary | ICD-10-CM

## 2011-12-18 DIAGNOSIS — J329 Chronic sinusitis, unspecified: Secondary | ICD-10-CM

## 2011-12-18 MED ORDER — DEXAMETHASONE SODIUM PHOSPHATE 10 MG/ML IJ SOLN
10.0000 mg | Freq: Once | INTRAMUSCULAR | Status: AC
  Administered 2011-12-18: 10 mg via INTRAMUSCULAR

## 2011-12-18 MED ORDER — PREDNISONE 20 MG PO TABS: 60.0000 mg | ORAL_TABLET | Freq: Every day | ORAL | Status: AC

## 2011-12-18 MED ORDER — DEXAMETHASONE SODIUM PHOSPHATE 10 MG/ML IJ SOLN
INTRAMUSCULAR | Status: AC
  Filled 2011-12-18: qty 1

## 2011-12-18 MED ORDER — FLUTICASONE PROPIONATE 50 MCG/ACT NA SUSP: 2.0000 | Freq: Every day | NASAL | Status: DC

## 2011-12-18 NOTE — ED Notes (Addendum)
Pt with onset of cold symptoms Thursday - took tylenol cold and flu last night woke this morning with bilateral ears swollen and rash face - headache/cough/congestion/facial swelling and rash - pt had been taking amoxicillin 875/125tid finished on the 13th

## 2011-12-18 NOTE — ED Provider Notes (Signed)
Medical screening examination/treatment/procedure(s) were performed by non-physician practitioner and as supervising physician I was immediately available for consultation/collaboration.  Corrie Mckusick, MD 12/18/11 2015

## 2011-12-18 NOTE — Discharge Instructions (Signed)
Please take benadryl for your allergic reaction. Return for worsening symptoms such as difficult breathing or throat swelling.   How to clear your sinuses:  Use Afrin (and over-the-counter nasal decongestant spray): 1-2 squirts in each nostril. Wait 10 minutes (while using either warm compresses to face, a humidifier, or taking a hot shower) Then do 2 squirts of fluticasone (prescription)  in each nostril. Do this for 1-2 weeks and that should help significantly with sinus congestion.   Allergic Reaction, Mild to Moderate Allergies may happen from anything your body is sensitive to. This may be food, medications, pollens, chemicals, and nearly anything around you in everyday life that produces allergens. An allergen is anything that causes an allergy producing substance. Allergens cause your body to release allergic antibodies. Through a chain of events, they cause a release of histamine into the blood stream. Histamines are meant to protect you, but they also cause your discomfort. This is why antihistamines are often used for allergies. Heredity is often a factor in causing allergic reactions. This means you may have some of the same allergies as your parents. Allergies happen in all age groups. You may have some idea of what caused your reaction. There are many allergens around [place]. It may be difficult to know what caused your reaction. If this is a first time event, it may never happen again. Allergies cannot be cured but can be controlled with medications. SYMPTOMS  You may get some or all of the following problems from allergies.  Swelling and itching in and around the mouth.   Tearing, itchy eyes.   Nasal congestion and runny nose.   Sneezing and coughing.   An itchy red rash or hives.   Vomiting or diarrhea.   Difficulty breathing.  Seasonal allergies occur in all age groups. They are seasonal because they usually occur during the same season every year. They may be a reaction to  molds, grass pollens, or tree pollens. Other causes of allergies are house dust mite allergens, pet dander and mold spores. These are just a common few of the thousands of allergens around [place]. All of the symptoms listed above happen when you come in contact with pollens and other allergens. Seasonal allergies are usually not life threatening. They are generally more of a nuisance that can often be handled using medications. Hay fever is a combination of all or some of the above listed allergy problems. It may often be treated with simple over-the-counter medications such as diphenhydramine. Take medication as directed. Check with your caregiver or package insert for child dosages. TREATMENT AND HOME CARE INSTRUCTIONS If hives or rash are present:  Take medications as directed.   You may use an over-the-counter antihistamine (diphenhydramine) for hives and itching as needed. Do not drive or drink alcohol until medications used to treat the reaction have worn off. Antihistamines tend to make people sleepy.   Apply cold cloths (compresses) to the skin or take baths in cool water. This will help itching. Avoid hot baths or showers. Heat will make a rash and itching worse.   If your allergies persist and become more severe, and over the counter medications are not effective, there are many new medications your caretaker can prescribe. Immunotherapy or desensitizing injections can be used if all else fails. Follow up with your caregiver if problems continue.  SEEK MEDICAL CARE IF:   Your allergies are becoming progressively more troublesome.   You suspect a food allergy. Symptoms generally happen within 30 minutes of  eating a food.   Your symptoms have not gone away within 2 days or are getting worse.   You develop new symptoms.   You want to retest yourself or your child with a food or drink you think causes an allergic reaction. Never test yourself or your child of a suspected allergy without being  under the watchful eye of your caregivers. A second exposure to an allergen may be life-threatening.  SEEK IMMEDIATE MEDICAL CARE IF:  You develop difficulty breathing or wheezing, or have a tight feeling in your chest or throat.   You develop a swollen mouth, hives, swelling, or itching all over your body.  A severe reaction with any of the above problems should be considered life-threatening. If you suddenly develop difficulty breathing call for local emergency medical help. THIS IS AN EMERGENCY. MAKE SURE YOU:   Understand these instructions.   Will watch your condition.   Will get help right away if you are not doing well or get worse.  Document Released: 08/14/2007 Document Revised: 06/29/2011 Document Reviewed: 08/14/2007 [facility] Patient Information 2012 [place], [place].  Sinusitis Sinuses are air pockets within the bones of your face. The growth of bacteria within a sinus leads to infection. The infection prevents the sinuses from draining. This infection is called sinusitis. SYMPTOMS  There will be different areas of pain depending on which sinuses have become infected.  The maxillary sinuses often produce pain beneath the eyes.   Frontal sinusitis may cause pain in the middle of the forehead and above the eyes.  Other problems (symptoms) include:  Toothaches.   Colored, pus-like (purulent) drainage from the nose.   Swelling, warmth, and tenderness over the sinus areas may be signs of infection.  TREATMENT  Sinusitis is most often determined by an exam.X-rays may be taken. If x-rays have been taken, make sure you obtain your results or find out how you are to obtain them. Your caregiver may give you medications (antibiotics). These are medications that will help kill the bacteria causing the infection. You may also be given a medication (decongestant) that helps to reduce sinus swelling.  HOME CARE INSTRUCTIONS   Only take over-the-counter or prescription medicines for  pain, discomfort, or fever as directed by your caregiver.   Drink extra fluids. Fluids help thin the mucus so your sinuses can drain more easily.   Applying either moist heat or ice packs to the sinus areas may help relieve discomfort.   Use saline nasal sprays to help moisten your sinuses. The sprays can be found at your local drugstore.  SEEK IMMEDIATE MEDICAL CARE IF:  You have a fever.   You have increasing pain, severe headaches, or toothache.   You have nausea, vomiting, or drowsiness.   You develop unusual swelling around the face or trouble seeing.  MAKE SURE YOU:   Understand these instructions.   Will watch your condition.   Will get help right away if you are not doing well or get worse.  Document Released: 10/17/2005 Document Revised: 06/29/2011 Document Reviewed: 05/16/2007 [facility] Patient Information 2012 [place], [place].

## 2011-12-18 NOTE — ED Provider Notes (Signed)
History     CSN: [id number]  Arrival date & time 12/18/11  0940  10:34 AM HPI Pt reports URI symptoms for 2 days. Symptoms included nasal congestion, rhinorrhea, sinus headache, sore throat, mild cough. Reports she called her primary care provider yesterday and was advised to take Tylenol cold and flu. States that this morning she woke up with bilateral ear swelling in generalized maculopapular rash. Reports her ears are pruritic. Denies shortness of breath, wheezing, throat irritation, tongue swelling, angioedema. Denies fever, neck pain, chest pain, shortness of breath.  Patient is a 31 y.o. female presenting with allergic reaction and URI. The history is provided by the patient.  Allergic Reaction The primary symptoms are  cough and rash. The primary symptoms do not include wheezing, shortness of breath, abdominal pain, nausea, vomiting, angioedema or urticaria. The current episode started 3 to 5 hours ago. The problem has not changed since onset.This is a new problem.  The rash began today. The rash appears on the abdomen, torso, back, left arm, right arm and face. The pain associated with the rash is mild. The rash is associated with itching.  The onset of the reaction was associated with a new medication. Significant symptoms also include rhinorrhea and itching.  URI The primary symptoms include headaches, sore throat, cough and rash. Primary symptoms do not include fever, wheezing, abdominal pain, nausea, vomiting or myalgias. The current episode started 2 days ago. This is a new problem. The problem has not changed since onset. The headache began 2 days ago. The headache developed gradually. Headache is a new problem. The headache is not associated with photophobia, double vision, eye pain, stiff neck or weakness.  The rash is associated with itching.  Symptoms associated with the illness include facial pain, sinus pressure, congestion and rhinorrhea. The illness is not associated with  chills.    History reviewed. No pertinent past medical history.  Past Surgical History  Procedure Date  . Foot surgery   . Cesarean section   . Foot surgery     Family History  Problem Relation Age of Onset  . Diabetes Mother   . Anemia Mother   . Anemia Sister     History  Substance Use Topics  . Smoking status: Current Everyday Smoker -- 0.3 packs/day    Types: Cigarettes  . Smokeless tobacco: Not on file  . Alcohol Use: Yes     rare    OB History    Grav Para Term Preterm Abortions TAB SAB Ect Mult Living                  Review of Systems  Constitutional: Negative for fever and chills.  HENT: Positive for congestion, sore throat, rhinorrhea and sinus pressure. Negative for neck pain.        Ear swelling  Eyes: Negative for double vision, photophobia and pain.  Respiratory: Positive for cough. Negative for shortness of breath and wheezing.   Gastrointestinal: Negative for nausea, vomiting and abdominal pain.  Musculoskeletal: Negative for myalgias.  Skin: Positive for itching and rash.  Neurological: Positive for headaches. Negative for weakness.  All other systems reviewed and are negative.    Allergies  Review of patient's allergies indicates no known allergies.  Home Medications   Current Outpatient Rx  Name Route Sig Dispense Refill  . TYLENOL COLD/FLU SEVERE PO Oral Take by mouth.    . ASPIRIN 500 MG PO TABS Oral Take 500 mg by mouth every 6 (six) hours as needed.      Marland Kitchen  MELOXICAM 7.5 MG PO TABS Oral Take 1 tablet (7.5 mg total) by mouth daily. 14 tablet 0    BP 109/70  Pulse 94  Temp(Src) 98.6 F (37 C) (Oral)  Resp 20  SpO2 100%  LMP 12/16/2011  Physical Exam  Vitals reviewed. Constitutional: She appears well-developed and well-nourished.  HENT:  Head: Normocephalic and atraumatic.  Right Ear: Tympanic membrane and ear canal normal. There is swelling (pinna). No tenderness.  Left Ear: Tympanic membrane and ear canal normal. There is  swelling (pinna). No tenderness.  Nose: Rhinorrhea present. Right sinus exhibits maxillary sinus tenderness. Right sinus exhibits no frontal sinus tenderness. Left sinus exhibits maxillary sinus tenderness. Left sinus exhibits no frontal sinus tenderness.  Mouth/Throat: Uvula is midline and mucous membranes are normal.       Post nasal drip  Eyes: Conjunctivae and EOM are normal. Pupils are equal, round, and reactive to light.  Neck: Normal range of motion. Neck supple. No spinous process tenderness and no muscular tenderness present. No edema, no erythema and normal range of motion present.  Cardiovascular: Normal rate, regular rhythm and normal heart sounds.  Exam reveals no friction rub.   No murmur heard. Pulmonary/Chest: Effort normal and breath sounds normal. She has no wheezes. She has no rales. She exhibits no tenderness.  Abdominal: Soft. Bowel sounds are normal.  Musculoskeletal: Normal range of motion.  Lymphadenopathy:    She has no cervical adenopathy.  Neurological: She has normal strength.  Skin: Skin is warm and dry. No rash noted. No erythema. No pallor.    ED Course  Procedures   MDM          Thomasene Lot, PA-C 12/18/11 1102

## 2013-08-22 ENCOUNTER — Telehealth: Payer: Self-pay | Admitting: Sports Medicine

## 2013-08-22 NOTE — Telephone Encounter (Signed)
Has been using "pink solution" for rash.  Alkaline bathroom cleaner/disinfectant.  C/o right hand is "dry and itchy."  Has few bumps on it.  Denies any blisters, open skin, and oozing.  Informed patient that she can try Benadryl cream.  Keep area dry, avoid scratching, and do not expose to irritants.  Will call back if site not better.  Gaylene Brooks, RN

## 2013-08-22 NOTE — Telephone Encounter (Signed)
Right hand has broke out with rash and bumps from a chemical at work. She wanted to know what she can put on it. She only has family planning medicaid and this would not be covered if she can come in. [place]

## 2014-07-31 ENCOUNTER — Encounter (HOSPITAL_COMMUNITY): Payer: Self-pay | Admitting: Emergency Medicine

## 2014-07-31 ENCOUNTER — Emergency Department (INDEPENDENT_AMBULATORY_CARE_PROVIDER_SITE_OTHER)
Admission: EM | Admit: 2014-07-31 | Discharge: 2014-07-31 | Disposition: A | Discharge status: Home / Self Care | Payer: Self-pay | Source: Home / Self Care | Attending: Family Medicine | Admitting: Family Medicine

## 2014-07-31 DIAGNOSIS — R509 Fever, unspecified: Secondary | ICD-10-CM

## 2014-07-31 DIAGNOSIS — R1011 Right upper quadrant pain: Secondary | ICD-10-CM

## 2014-07-31 DIAGNOSIS — J029 Acute pharyngitis, unspecified: Secondary | ICD-10-CM

## 2014-07-31 LAB — COMPREHENSIVE METABOLIC PANEL
ALBUMIN: 4.2 g/dL (ref 3.5–5.2)
ALK PHOS: 53 U/L (ref 39–117)
ALT: 8 U/L (ref 0–35)
ANION GAP: 15 (ref 5–15)
AST: 17 U/L (ref 0–37)
BILIRUBIN TOTAL: 0.5 mg/dL (ref 0.3–1.2)
BUN: 6 mg/dL (ref 6–23)
CHLORIDE: 101 meq/L (ref 96–112)
CO2: 20 mEq/L (ref 19–32)
CREATININE: 0.76 mg/dL (ref 0.50–1.10)
Calcium: 9.5 mg/dL (ref 8.4–10.5)
GLUCOSE: 76 mg/dL (ref 70–99)
POTASSIUM: 3.6 meq/L — AB (ref 3.7–5.3)
Sodium: 136 mEq/L — ABNORMAL LOW (ref 137–147)
Total Protein: 7.6 g/dL (ref 6.0–8.3)

## 2014-07-31 LAB — CBC WITH DIFFERENTIAL/PLATELET
BASOS PCT: 0 % (ref 0–1)
Basophils Absolute: 0 10*3/uL (ref 0.0–0.1)
Eosinophils Absolute: 0 10*3/uL (ref 0.0–0.7)
Eosinophils Relative: 0 % (ref 0–5)
HEMATOCRIT: 38.6 % (ref 36.0–46.0)
HEMOGLOBIN: 13.2 g/dL (ref 12.0–15.0)
LYMPHS ABS: 1.8 10*3/uL (ref 0.7–4.0)
Lymphocytes Relative: 16 % (ref 12–46)
MCH: 27.6 pg (ref 26.0–34.0)
MCHC: 34.2 g/dL (ref 30.0–36.0)
MCV: 80.6 fL (ref 78.0–100.0)
MONO ABS: 1.1 10*3/uL — AB (ref 0.1–1.0)
MONOS PCT: 10 % (ref 3–12)
NEUTROS ABS: 8.3 10*3/uL — AB (ref 1.7–7.7)
NEUTROS PCT: 74 % (ref 43–77)
Platelets: 198 10*3/uL (ref 150–400)
RBC: 4.79 MIL/uL (ref 3.87–5.11)
RDW: 12.5 % (ref 11.5–15.5)
WBC: 11.1 10*3/uL — ABNORMAL HIGH (ref 4.0–10.5)

## 2014-07-31 LAB — POCT URINALYSIS DIP (DEVICE)
Bilirubin Urine: NEGATIVE
Glucose, UA: NEGATIVE mg/dL
Hgb urine dipstick: NEGATIVE
Ketones, ur: NEGATIVE mg/dL
LEUKOCYTES UA: NEGATIVE
NITRITE: NEGATIVE
PH: 8.5 — AB (ref 5.0–8.0)
PROTEIN: NEGATIVE mg/dL
Specific Gravity, Urine: 1.015 (ref 1.005–1.030)
UROBILINOGEN UA: 0.2 mg/dL (ref 0.0–1.0)

## 2014-07-31 LAB — POCT PREGNANCY, URINE: PREG TEST UR: NEGATIVE

## 2014-07-31 LAB — POCT RAPID STREP A: STREPTOCOCCUS, GROUP A SCREEN (DIRECT): NEGATIVE

## 2014-07-31 LAB — LIPASE, BLOOD: LIPASE: 28 U/L (ref 11–59)

## 2014-07-31 MED ORDER — ACETAMINOPHEN 325 MG PO TABS
650.0000 mg | ORAL_TABLET | Freq: Once | ORAL | Status: AC
  Administered 2014-07-31: 650 mg via ORAL

## 2014-07-31 MED ORDER — HYDROCODONE-ACETAMINOPHEN 5-325 MG PO TABS: 1.0000 | ORAL_TABLET | Freq: Four times a day (QID) | ORAL | Status: DC | PRN

## 2014-07-31 MED ORDER — ACETAMINOPHEN 325 MG PO TABS
ORAL_TABLET | ORAL | Status: AC
  Filled 2014-07-31: qty 2

## 2014-07-31 MED ORDER — IBUPROFEN 800 MG PO TABS: 800.0000 mg | ORAL_TABLET | Freq: Three times a day (TID) | ORAL | Status: DC | PRN

## 2014-07-31 NOTE — ED Provider Notes (Signed)
Medical screening examination/treatment/procedure(s) were performed by resident physician or non-physician practitioner and as supervising physician I was immediately available for consultation/collaboration.   Solon Alban DOUGLAS MD.   Suha Schoenbeck D Jaquavious Mercer, MD 07/31/14 2110 

## 2014-07-31 NOTE — ED Provider Notes (Signed)
CSN: [id number]     Arrival date & time 07/31/14  1657 History   None    Chief Complaint  Patient presents with  . Fever   (Consider location/radiation/quality/duration/timing/severity/associated sxs/prior Treatment) HPI   33 year old female presents complaining of sore throat, congestion, myalgias, arthralgias, chills, abdominal pain. Her symptoms began this morning. She woke up with sore throat and the rest of the symptoms have been getting worse since then. The abdominal pain started to get better in the past hour. She has nausea but no vomiting. Her son has a cold right now, no other sick contacts. No medications taken for treatment.  History reviewed. No pertinent past medical history. Past Surgical History  Procedure Laterality Date  . Foot surgery    . Cesarean section    . Foot surgery     Family History  Problem Relation Age of Onset  . Diabetes Mother   . Anemia Mother   . Anemia Sister    History  Substance Use Topics  . Smoking status: Current Every Day Smoker -- 0.30 packs/day    Types: Cigarettes  . Smokeless tobacco: Not on file  . Alcohol Use: Yes     Comment: rare   OB History   Grav Para Term Preterm Abortions TAB SAB Ect Mult Living                 Review of Systems  Constitutional: Positive for fever and chills.  HENT: Positive for congestion, rhinorrhea and sore throat. Negative for ear pain, postnasal drip and sinus pressure.   Respiratory: Negative for chest tightness and shortness of breath.   Cardiovascular: Negative for chest pain.  Gastrointestinal: Positive for nausea and abdominal pain. Negative for vomiting and diarrhea.  Musculoskeletal: Positive for arthralgias and myalgias.  All other systems reviewed and are negative.   Allergies  Review of patient's allergies indicates no known allergies.  Home Medications   Prior to Admission medications   Medication Sig Start Date End Date Taking? Authorizing Provider  aspirin 500 MG tablet  Take 500 mg by mouth every 6 (six) hours as needed.      Historical Provider, MD  fluticasone (FLONASE) 50 MCG/ACT nasal spray Place 2 sprays into the nose daily. 12/18/11 12/17/12  Sheliah Mends, PA-C  HYDROcodone-acetaminophen (NORCO) 5-325 MG per tablet Take 1 tablet by mouth every 6 (six) hours as needed for moderate pain. 07/31/14   Liam Graham, PA-C  ibuprofen (ADVIL,MOTRIN) 800 MG tablet Take 1 tablet (800 mg total) by mouth every 8 (eight) hours as needed for fever or moderate pain. 07/31/14   Freeman Caldron Jeffie Widdowson, PA-C  Phenylephrine-DM-GG-APAP (TYLENOL COLD/FLU SEVERE PO) Take by mouth.    Historical Provider, MD   BP 111/69  Pulse 112  Temp(Src) 100.2 F (37.9 C) (Oral)  Resp 20  SpO2 100%  LMP 07/14/2014 Physical Exam  Nursing note and vitals reviewed. Constitutional: She is oriented to person, place, and time. Vital signs are normal. She appears well-developed and well-nourished. No distress.  HENT:  Head: Normocephalic and atraumatic.  Right Ear: Tympanic membrane, external ear and ear canal normal.  Left Ear: Tympanic membrane, external ear and ear canal normal.  Nose: Nose normal.  Mouth/Throat: Uvula is midline and mucous membranes are normal. No oropharyngeal exudate or posterior oropharyngeal erythema.  Eyes: Conjunctivae are normal. Right eye exhibits no discharge. Left eye exhibits no discharge.  Neck: Normal range of motion. Neck supple.  Cardiovascular: Regular rhythm and normal heart sounds.  Tachycardia present.  Pulmonary/Chest: Effort normal and breath sounds normal. No accessory muscle usage. Not tachypneic. No respiratory distress. She has no wheezes. She has no rhonchi. She has no rales.  Abdominal: Normal appearance and bowel sounds are normal. She exhibits no ascites and no mass. There is no hepatosplenomegaly. There is tenderness in the right upper quadrant. There is no rigidity, no rebound, no guarding, no CVA tenderness, no tenderness at McBurney's point and  negative Murphy's sign.  Lymphadenopathy:    She has no cervical adenopathy.  Neurological: She is alert and oriented to person, place, and time. She has normal strength. Coordination normal.  Skin: Skin is warm and dry. No rash noted. She is not diaphoretic.  Psychiatric: She has a normal mood and affect. Judgment normal.    ED Course  Procedures (including critical care time) Labs Review Labs Reviewed  CBC WITH DIFFERENTIAL - Abnormal; Notable for the following:    WBC 11.1 (*)    Neutro Abs 8.3 (*)    Monocytes Absolute 1.1 (*)    All other components within normal limits  COMPREHENSIVE METABOLIC PANEL - Abnormal; Notable for the following:    Sodium 136 (*)    Potassium 3.6 (*)    All other components within normal limits  POCT URINALYSIS DIP (DEVICE) - Abnormal; Notable for the following:    pH 8.5 (*)    All other components within normal limits  CULTURE, GROUP A STREP  LIPASE, BLOOD  POCT RAPID STREP A ([facility] CARE ONLY)  POCT PREGNANCY, URINE    Imaging Review No results found.   MDM   1. Other specified fever   2. Pharyngitis   3. Abdominal pain, RUQ    High fever with normal exam, minimal leukocytosis.  Most likely viral infection.  Treat symptomatically.  F/U if worsening     Meds ordered this encounter  Medications  . acetaminophen (TYLENOL) tablet 650 mg    Sig:   . HYDROcodone-acetaminophen (NORCO) 5-325 MG per tablet    Sig: Take 1 tablet by mouth every 6 (six) hours as needed for moderate pain.    Dispense:  15 tablet    Refill:  0    Order Specific Question:  Supervising Provider    Answer:  Jake Michaelis, DAVID C [id number]  . ibuprofen (ADVIL,MOTRIN) 800 MG tablet    Sig: Take 1 tablet (800 mg total) by mouth every 8 (eight) hours as needed for fever or moderate pain.    Dispense:  30 tablet    Refill:  0    Order Specific Question:  Supervising Provider    Answer:  Jake Michaelis, DAVID C [[id number]]       Liam Graham, PA-C 07/31/14 [phone]

## 2014-07-31 NOTE — Discharge Instructions (Signed)
Fever, Adult °A fever is a higher than normal body temperature. In an adult, an oral temperature around 98.6° F (37° C) is considered normal. A temperature of 100.4° F (38° C) or higher is generally considered a fever. Mild or moderate fevers generally have no long-term effects and often do not require treatment. Extreme fever (greater than or equal to 106° F or 41.1° C) can cause seizures. The sweating that may occur with repeated or prolonged fever may cause dehydration. Elderly people can develop confusion during a fever. °A measured temperature can vary with: °· Age. °· Time of day. °· Method of measurement (mouth, underarm, rectal, or ear). °The fever is confirmed by taking a temperature with a thermometer. Temperatures can be taken different ways. Some methods are accurate and some are not. °· An oral temperature is used most commonly. Electronic thermometers are fast and accurate. °· An ear temperature will only be accurate if the thermometer is positioned as recommended by the manufacturer. °· A rectal temperature is accurate and done for those adults who have a condition where an oral temperature cannot be taken. °· An underarm (axillary) temperature is not accurate and not recommended. °Fever is a symptom, not a disease.  °CAUSES  °· Infections commonly cause fever. °· Some noninfectious causes for fever include: °¨ Some arthritis conditions. °¨ Some thyroid or adrenal gland conditions. °¨ Some immune system conditions. °¨ Some types of cancer. °¨ A medicine reaction. °¨ High doses of certain street drugs such as methamphetamine. °¨ Dehydration. °¨ Exposure to high outside or room temperatures. °· Occasionally, the source of a fever cannot be determined. This is sometimes called a "fever of unknown origin" (FUO). °· Some situations may lead to a temporary rise in body temperature that may go away on its own. Examples are: °¨ Childbirth. °¨ Surgery. °¨ Intense exercise. °HOME CARE INSTRUCTIONS  °· Take  appropriate medicines for fever. Follow dosing instructions carefully. If you use acetaminophen to reduce the fever, be careful to avoid taking other medicines that also contain acetaminophen. Do not take aspirin for a fever if you are younger than age 19. There is an association with Reye's syndrome. Reye's syndrome is a rare but potentially deadly disease. °· If an infection is present and antibiotics have been prescribed, take them as directed. Finish them even if you start to feel better. °· Rest as needed. °· Maintain an adequate fluid intake. To prevent dehydration during an illness with prolonged or recurrent fever, you may need to drink extra fluid. Drink enough fluids to keep your urine clear or pale yellow. °· Sponging or bathing with room temperature water may help reduce body temperature. Do not use ice water or alcohol sponge baths. °· Dress comfortably, but do not over-bundle. °SEEK MEDICAL CARE IF:  °· You are unable to keep fluids down. °· You develop vomiting or diarrhea. °· You are not feeling at least partly better after 3 days. °· You develop new symptoms or problems. °SEEK IMMEDIATE MEDICAL CARE IF:  °· You have shortness of breath or trouble breathing. °· You develop excessive weakness. °· You are dizzy or you faint. °· You are extremely thirsty or you are making little or no urine. °· You develop new pain that was not there before (such as in the head, neck, chest, back, or abdomen). °· You have persistent vomiting and diarrhea for more than 1 to 2 days. °· You develop a stiff neck or your eyes become sensitive to light. °· You develop a   skin rash.  You have a fever or persistent symptoms for more than 2 to 3 days.  You have a fever and your symptoms suddenly get worse. MAKE SURE YOU:   Understand these instructions.  Will watch your condition.  Will get help right away if you are not doing well or get worse. Document Released: 04/12/2001 Document Revised: 03/03/2014 Document  Reviewed: 08/18/2011 [facility] Patient Information 2015 [place], [place]. This information is not intended to replace advice given to you by your health care provider. Make sure you discuss any questions you have with your health care provider.  Pharyngitis Pharyngitis is a sore throat (pharynx). There is redness, pain, and swelling of your throat. HOME CARE   Drink enough fluids to keep your pee (urine) clear or pale yellow.  Only take medicine as told by your doctor.  You may get sick again if you do not take medicine as told. Finish your medicines, even if you start to feel better.  Do not take aspirin.  Rest.  Rinse your mouth (gargle) with salt water ( tsp of salt per 1 qt of water) every 1-2 hours. This will help the pain.  If you are not at risk for choking, you can suck on hard candy or sore throat lozenges. GET HELP IF:  You have large, tender lumps on your neck.  You have a rash.  You cough up green, yellow-brown, or bloody spit. GET HELP RIGHT AWAY IF:   You have a stiff neck.  You drool or cannot swallow liquids.  You throw up (vomit) or are not able to keep medicine or liquids down.  You have very bad pain that does not go away with medicine.  You have problems breathing (not from a stuffy nose). MAKE SURE YOU:   Understand these instructions.  Will watch your condition.  Will get help right away if you are not doing well or get worse. Document Released: 04/04/2008 Document Revised: 08/07/2013 Document Reviewed: 06/24/2013 [facility] Patient Information 2015 [place], [place]. This information is not intended to replace advice given to you by your health care provider. Make sure you discuss any questions you have with your health care provider.  Abdominal Pain, Women Abdominal (stomach, pelvic, or belly) pain can be caused by many things. It is important to tell your doctor:  The location of the pain.  Does it come and go or is it present all the  time?  Are there things that start the pain (eating certain foods, exercise)?  Are there other symptoms associated with the pain (fever, nausea, vomiting, diarrhea)? All of this is helpful to know when trying to find the cause of the pain. CAUSES   Stomach: virus or bacteria infection, or ulcer.  Intestine: appendicitis (inflamed appendix), regional ileitis (Crohn's disease), ulcerative colitis (inflamed colon), irritable bowel syndrome, diverticulitis (inflamed diverticulum of the colon), or cancer of the stomach or intestine.  Gallbladder disease or stones in the gallbladder.  Kidney disease, kidney stones, or infection.  Pancreas infection or cancer.  Fibromyalgia (pain disorder).  Diseases of the female organs:  Uterus: fibroid (non-cancerous) tumors or infection.  Fallopian tubes: infection or tubal pregnancy.  Ovary: cysts or tumors.  Pelvic adhesions (scar tissue).  Endometriosis (uterus lining tissue growing in the pelvis and on the pelvic organs).  Pelvic congestion syndrome (female organs filling up with blood just before the menstrual period).  Pain with the menstrual period.  Pain with ovulation (producing an egg).  Pain with an IUD (intrauterine device, birth  control) in the uterus.  Cancer of the female organs.  Functional pain (pain not caused by a disease, may improve without treatment).  Psychological pain.  Depression. DIAGNOSIS  Your doctor will decide the seriousness of your pain by doing an examination.  Blood tests.  X-rays.  Ultrasound.  CT scan (computed tomography, special type of X-ray).  MRI (magnetic resonance imaging).  Cultures, for infection.  Barium enema (dye inserted in the large intestine, to better view it with X-rays).  Colonoscopy (looking in intestine with a lighted tube).  Laparoscopy (minor surgery, looking in abdomen with a lighted tube).  Major abdominal exploratory surgery (looking in abdomen with a large  incision). TREATMENT  The treatment will depend on the cause of the pain.   Many cases can be observed and treated at home.  Over-the-counter medicines recommended by your caregiver.  Prescription medicine.  Antibiotics, for infection.  Birth control pills, for painful periods or for ovulation pain.  Hormone treatment, for endometriosis.  Nerve blocking injections.  Physical therapy.  Antidepressants.  Counseling with a psychologist or psychiatrist.  Minor or major surgery. HOME CARE INSTRUCTIONS   Do not take laxatives, unless directed by your caregiver.  Take over-the-counter pain medicine only if ordered by your caregiver. Do not take aspirin because it can cause an upset stomach or bleeding.  Try a clear liquid diet (broth or water) as ordered by your caregiver. Slowly move to a bland diet, as tolerated, if the pain is related to the stomach or intestine.  Have a thermometer and take your temperature several times a day, and record it.  Bed rest and sleep, if it helps the pain.  Avoid sexual intercourse, if it causes pain.  Avoid stressful situations.  Keep your follow-up appointments and tests, as your caregiver orders.  If the pain does not go away with medicine or surgery, you may try:  Acupuncture.  Relaxation exercises (yoga, meditation).  Group therapy.  Counseling. SEEK MEDICAL CARE IF:   You notice certain foods cause stomach pain.  Your home care treatment is not helping your pain.  You need stronger pain medicine.  You want your IUD removed.  You feel faint or lightheaded.  You develop nausea and vomiting.  You develop a rash.  You are having side effects or an allergy to your medicine. SEEK IMMEDIATE MEDICAL CARE IF:   Your pain does not go away or gets worse.  You have a fever.  Your pain is felt only in portions of the abdomen. The right side could possibly be appendicitis. The left lower portion of the abdomen could be  colitis or diverticulitis.  You are passing blood in your stools (bright red or black tarry stools, with or without vomiting).  You have blood in your urine.  You develop chills, with or without a fever.  You pass out. MAKE SURE YOU:   Understand these instructions.  Will watch your condition.  Will get help right away if you are not doing well or get worse. Document Released: 08/14/2007 Document Revised: 03/03/2014 Document Reviewed: 09/03/2009 [facility] Patient Information 2015 [place], [place]. This information is not intended to replace advice given to you by your health care provider. Make sure you discuss any questions you have with your health care provider.

## 2014-07-31 NOTE — ED Notes (Signed)
Patient is unable to void at this time 

## 2014-07-31 NOTE — ED Notes (Signed)
Pt  Reports  Symptoms   Of  sorethroat         Body   Aches   Chills          And  Fever  With  Onset    Today     -      No     antipretics

## 2014-08-01 LAB — CULTURE, GROUP A STREP

## 2014-08-02 ENCOUNTER — Telehealth (HOSPITAL_COMMUNITY): Payer: Self-pay | Admitting: *Deleted

## 2014-08-02 ENCOUNTER — Telehealth (HOSPITAL_COMMUNITY): Payer: Self-pay | Admitting: Emergency Medicine

## 2014-08-02 MED ORDER — AMOXICILLIN 500 MG PO CAPS: 500.0000 mg | ORAL_CAPSULE | Freq: Three times a day (TID) | ORAL | Status: DC

## 2014-08-02 NOTE — ED Notes (Signed)
Culture came back positive for group A strep and she was not treated. I will order a prescription for amoxicillin 500 mg, #30, one 3 times a day for 10 days for her. She does not have pharmacy selected, so we will need to call her, find out what pharmacy she uses, then call the prescription in to her drugstore.  Harden Mo, MD 08/02/14 [phone]

## 2014-08-02 NOTE — ED Notes (Signed)
I called pt.  Pt. verified x 2 and given results.  Pt. told she needs Amoxicillin for Strep.  Pt. wants Rx. called to the [facility] on [address].  Pt. told to finish all of medication and get rechecked if not better.  If anyone she exposed gets the same symptoms, should get checked for strep as well.  Pt. voiced understanding.  Rx. called to pharmacist at [phone]. Roselyn Meier 08/02/2014

## 2016-05-31 ENCOUNTER — Emergency Department (HOSPITAL_COMMUNITY): Payer: No Typology Code available for payment source

## 2016-05-31 ENCOUNTER — Emergency Department (HOSPITAL_COMMUNITY)
Admission: EM | Admit: 2016-05-31 | Discharge: 2016-05-31 | Disposition: A | Discharge status: Home / Self Care | Payer: No Typology Code available for payment source | Attending: Emergency Medicine | Admitting: Emergency Medicine

## 2016-05-31 ENCOUNTER — Encounter (HOSPITAL_COMMUNITY): Payer: Self-pay | Admitting: *Deleted

## 2016-05-31 DIAGNOSIS — M545 Low back pain, unspecified: Secondary | ICD-10-CM

## 2016-05-31 DIAGNOSIS — Z791 Long term (current) use of non-steroidal anti-inflammatories (NSAID): Secondary | ICD-10-CM | POA: Diagnosis not present

## 2016-05-31 DIAGNOSIS — R519 Headache, unspecified: Secondary | ICD-10-CM

## 2016-05-31 DIAGNOSIS — R51 Headache: Secondary | ICD-10-CM | POA: Diagnosis not present

## 2016-05-31 DIAGNOSIS — Y999 Unspecified external cause status: Secondary | ICD-10-CM | POA: Diagnosis not present

## 2016-05-31 DIAGNOSIS — Y939 Activity, unspecified: Secondary | ICD-10-CM | POA: Insufficient documentation

## 2016-05-31 DIAGNOSIS — M542 Cervicalgia: Secondary | ICD-10-CM | POA: Diagnosis present

## 2016-05-31 DIAGNOSIS — Z7982 Long term (current) use of aspirin: Secondary | ICD-10-CM | POA: Diagnosis not present

## 2016-05-31 DIAGNOSIS — S161XXA Strain of muscle, fascia and tendon at neck level, initial encounter: Secondary | ICD-10-CM | POA: Diagnosis not present

## 2016-05-31 DIAGNOSIS — F17210 Nicotine dependence, cigarettes, uncomplicated: Secondary | ICD-10-CM | POA: Insufficient documentation

## 2016-05-31 DIAGNOSIS — Y92410 Unspecified street and highway as the place of occurrence of the external cause: Secondary | ICD-10-CM | POA: Diagnosis not present

## 2016-05-31 LAB — POC URINE PREG, ED: PREG TEST UR: NEGATIVE

## 2016-05-31 MED ORDER — CYCLOBENZAPRINE HCL 10 MG PO TABS: 10.0000 mg | ORAL_TABLET | Freq: Two times a day (BID) | ORAL | 0 refills | Status: DC | PRN

## 2016-05-31 MED ORDER — NAPROXEN 500 MG PO TABS: 500.0000 mg | ORAL_TABLET | Freq: Two times a day (BID) | ORAL | 0 refills | Status: DC

## 2016-05-31 MED ORDER — NAPROXEN 500 MG PO TABS
500.0000 mg | ORAL_TABLET | Freq: Once | ORAL | Status: AC
  Administered 2016-05-31: 500 mg via ORAL
  Filled 2016-05-31: qty 1

## 2016-05-31 MED ORDER — ACETAMINOPHEN 325 MG PO TABS
650.0000 mg | ORAL_TABLET | Freq: Once | ORAL | Status: AC
  Administered 2016-05-31: 650 mg via ORAL
  Filled 2016-05-31: qty 2
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
## 2016-08-15 ENCOUNTER — Inpatient Hospital Stay (HOSPITAL_COMMUNITY): Payer: Medicaid Other

## 2016-08-15 ENCOUNTER — Encounter (HOSPITAL_COMMUNITY): Payer: Self-pay

## 2016-08-15 ENCOUNTER — Inpatient Hospital Stay (HOSPITAL_COMMUNITY)
Admission: AD | Admit: 2016-08-15 | Discharge: 2016-08-15 | Disposition: A | Discharge status: Home / Self Care | Payer: Medicaid Other | Source: Ambulatory Visit | Attending: Family Medicine | Admitting: Family Medicine

## 2016-08-15 DIAGNOSIS — R102 Pelvic and perineal pain: Secondary | ICD-10-CM

## 2016-08-15 DIAGNOSIS — Z3A01 Less than 8 weeks gestation of pregnancy: Secondary | ICD-10-CM

## 2016-08-15 DIAGNOSIS — O26891 Other specified pregnancy related conditions, first trimester: Secondary | ICD-10-CM | POA: Diagnosis not present

## 2016-08-15 DIAGNOSIS — F17210 Nicotine dependence, cigarettes, uncomplicated: Secondary | ICD-10-CM | POA: Insufficient documentation

## 2016-08-15 DIAGNOSIS — O99331 Smoking (tobacco) complicating pregnancy, first trimester: Secondary | ICD-10-CM | POA: Diagnosis not present

## 2016-08-15 LAB — URINALYSIS, ROUTINE W REFLEX MICROSCOPIC
Bilirubin Urine: NEGATIVE
GLUCOSE, UA: NEGATIVE mg/dL
Hgb urine dipstick: NEGATIVE
Ketones, ur: 15 mg/dL — AB
LEUKOCYTES UA: NEGATIVE
Nitrite: NEGATIVE
PH: 7.5 (ref 5.0–8.0)
Protein, ur: NEGATIVE mg/dL
Specific Gravity, Urine: 1.015 (ref 1.005–1.030)

## 2016-08-15 LAB — CBC
HEMATOCRIT: 36.4 % (ref 36.0–46.0)
Hemoglobin: 12.7 g/dL (ref 12.0–15.0)
MCH: 28.7 pg (ref 26.0–34.0)
MCHC: 34.9 g/dL (ref 30.0–36.0)
MCV: 82.2 fL (ref 78.0–100.0)
Platelets: 212 10*3/uL (ref 150–400)
RBC: 4.43 MIL/uL (ref 3.87–5.11)
RDW: 13.1 % (ref 11.5–15.5)
WBC: 7.5 10*3/uL (ref 4.0–10.5)

## 2016-08-15 LAB — WET PREP, GENITAL
SPERM: NONE SEEN
TRICH WET PREP: NONE SEEN
Yeast Wet Prep HPF POC: NONE SEEN

## 2016-08-15 LAB — POCT PREGNANCY, URINE: Preg Test, Ur: POSITIVE — AB

## 2016-08-15 LAB — HCG, QUANTITATIVE, PREGNANCY: HCG, BETA CHAIN, QUANT, S: 102047 m[IU]/mL — AB (ref <5)

## 2016-08-15 NOTE — Discharge Instructions (Signed)
Prenatal Care Providers °[facility]    [facility] ° Phone- [phone]     Phone: [phone] °         °[facility]                      [facility] ° @[place]     Phone: [phone] ° Phone: [phone] °        [facility] °[facility]     Phone: [phone] ° Phone: [phone]   °        [facility] °[facility] [place]                hone: [phone] ° Phone: [phone] °        [facility] °Dr. Bernard Marshall      Phone: [phone] ° Phone: [phone] °        [facility] °[facility]                Phone: [phone] ° Women’s Health  ° Phone:[phone]    [facility] °         Phone: [phone] °[facility] °  Phone: [phone] °Safe Medications in Pregnancy  ° °Acne: °Benzoyl Peroxide °Salicylic Acid ° °Backache/Headache: °Tylenol: 2 regular strength every 4 hours OR °             2 Extra strength every 6 hours ° °Colds/Coughs/Allergies: °Benadryl (alcohol free) 25 mg every 6 hours as needed °Breath right strips °Claritin °Cepacol throat lozenges °Chloraseptic throat spray °Cold-Eeze- up to three times per day °Cough drops, alcohol free °Flonase (by prescription only) °Guaifenesin °Mucinex °Robitussin DM (plain only, alcohol free) °Saline nasal spray/drops °Sudafed (pseudoephedrine) & Actifed ** use only after [redacted] weeks gestation and if you do not have high blood pressure °Tylenol °Vicks Vaporub °Zinc lozenges °Zyrtec  ° °Constipation: °Colace °Ducolax suppositories °Fleet enema °Glycerin suppositories °Metamucil °Milk of magnesia °Miralax °Senokot °Smooth move tea ° °Diarrhea: °Kaopectate °Imodium A-D ° °*NO pepto Bismol ° °Hemorrhoids: °Anusol °Anusol HC °Preparation H °Tucks ° °Indigestion: °Tums °Maalox °Mylanta °Zantac  °Pepcid ° °Insomnia: °Benadryl (alcohol free) 25mg every 6 hours as needed °Tylenol  PM °Unisom, no Gelcaps ° °Leg Cramps: °Tums °MagGel ° °Nausea/Vomiting:  °Bonine °Dramamine °Emetrol °Ginger extract °Sea bands °Meclizine  °Nausea medication to take during pregnancy:  °Unisom (doxylamine succinate 25 mg tablets) Take one tablet daily at bedtime. If symptoms are not adequately controlled, the dose can be increased to a maximum recommended dose of two tablets daily (1/2 tablet in the morning, 1/2 tablet mid-afternoon and one at bedtime). °Vitamin B6 100mg tablets. Take one tablet twice a day (up to 200 mg per day). ° °Skin Rashes: °Aveeno products °Benadryl cream or 25mg every 6 hours as needed °Calamine Lotion °1% cortisone cream ° °Yeast infection: °Gyne-lotrimin 7 °Monistat 7 ° ° °**If taking multiple medications, please check labels to avoid duplicating the same active ingredients °**take medication as directed on the label °** Do not exceed 4000 mg of tylenol in 24 hours °**Do not take medications that contain aspirin or ibuprofen ° ° °First Trimester of Pregnancy °The first trimester of pregnancy is from week 1 until the end of week 12 (months 1 through 3). A week after a sperm fertilizes an egg, the egg will implant on the wall of the uterus. This embryo will   begin to develop into a baby. Genes from you and your partner are forming the baby. The female genes determine whether the baby is a boy or a girl. At 6-8 weeks, the eyes and face are formed, and the heartbeat can be seen on ultrasound. At the end of 12 weeks, all the baby's organs are formed.  Now that you are pregnant, you will want to do everything you can to have a healthy baby. Two of the most important things are to get good prenatal care and to follow your health care provider's instructions. Prenatal care is all the medical care you receive before the baby's birth. This care will help prevent, find, and treat any problems during the pregnancy and childbirth. BODY CHANGES Your body goes through many changes during pregnancy. The  changes vary from woman to woman.   You may gain or lose a couple of pounds at first.  You may feel sick to your stomach (nauseous) and throw up (vomit). If the vomiting is uncontrollable, call your health care provider.  You may tire easily.  You may develop headaches that can be relieved by medicines approved by your health care provider.  You may urinate more often. Painful urination may mean you have a bladder infection.  You may develop heartburn as a result of your pregnancy.  You may develop constipation because certain hormones are causing the muscles that push waste through your intestines to slow down.  You may develop hemorrhoids or swollen, bulging veins (varicose veins).  Your breasts may begin to grow larger and become tender. Your nipples may stick out more, and the tissue that surrounds them (areola) may become darker.  Your gums may bleed and may be sensitive to brushing and flossing.  Dark spots or blotches (chloasma, mask of pregnancy) may develop on your face. This will likely fade after the baby is born.  Your menstrual periods will stop.  You may have a loss of appetite.  You may develop cravings for certain kinds of food.  You may have changes in your emotions from day to day, such as being excited to be pregnant or being concerned that something may go wrong with the pregnancy and baby.  You may have more vivid and strange dreams.  You may have changes in your hair. These can include thickening of your hair, rapid growth, and changes in texture. Some women also have hair loss during or after pregnancy, or hair that feels dry or thin. Your hair will most likely return to normal after your baby is born. WHAT TO EXPECT AT YOUR PRENATAL VISITS During a routine prenatal visit:  You will be weighed to make sure you and the baby are growing normally.  Your blood pressure will be taken.  Your abdomen will be measured to track your baby's growth.  The fetal  heartbeat will be listened to starting around week 10 or 12 of your pregnancy.  Test results from any previous visits will be discussed. Your health care provider may ask you:  How you are feeling.  If you are feeling the baby move.  If you have had any abnormal symptoms, such as leaking fluid, bleeding, severe headaches, or abdominal cramping.  If you are using any tobacco products, including cigarettes, chewing tobacco, and electronic cigarettes.  If you have any questions. Other tests that may be performed during your first trimester include:  Blood tests to find your blood type and to check for the presence of any previous infections. They  will also be used to check for low iron levels (anemia) and Rh antibodies. Later in the pregnancy, blood tests for diabetes will be done along with other tests if problems develop.  Urine tests to check for infections, diabetes, or protein in the urine.  An ultrasound to confirm the proper growth and development of the baby.  An amniocentesis to check for possible genetic problems.  Fetal screens for spina bifida and Down syndrome.  You may need other tests to make sure you and the baby are doing well.  HIV (human immunodeficiency virus) testing. Routine prenatal testing includes screening for HIV, unless you choose not to have this test. HOME CARE INSTRUCTIONS  Medicines  Follow your health care provider's instructions regarding medicine use. Specific medicines may be either safe or unsafe to take during pregnancy.  Take your prenatal vitamins as directed.  If you develop constipation, try taking a stool softener if your health care provider approves. Diet  Eat regular, well-balanced meals. Choose a variety of foods, such as meat or vegetable-based protein, fish, milk and low-fat dairy products, vegetables, fruits, and whole grain breads and cereals. Your health care provider will help you determine the amount of weight gain that is  right for you.  Avoid raw meat and uncooked cheese. These carry germs that can cause birth defects in the baby.  Eating four or five small meals rather than three large meals a day may help relieve nausea and vomiting. If you start to feel nauseous, eating a few soda crackers can be helpful. Drinking liquids between meals instead of during meals also seems to help nausea and vomiting.  If you develop constipation, eat more high-fiber foods, such as fresh vegetables or fruit and whole grains. Drink enough fluids to keep your urine clear or pale yellow. Activity and Exercise  Exercise only as directed by your health care provider. Exercising will help you:  Control your weight.  Stay in shape.  Be prepared for labor and delivery.  Experiencing pain or cramping in the lower abdomen or low back is a good sign that you should stop exercising. Check with your health care provider before continuing normal exercises.  Try to avoid standing for long periods of time. Move your legs often if you must stand in one place for a long time.  Avoid heavy lifting.  Wear low-heeled shoes, and practice good posture.  You may continue to have sex unless your health care provider directs you otherwise. Relief of Pain or Discomfort  Wear a good support bra for breast tenderness.   Take warm sitz baths to soothe any pain or discomfort caused by hemorrhoids. Use hemorrhoid cream if your health care provider approves.   Rest with your legs elevated if you have leg cramps or low back pain.  If you develop varicose veins in your legs, wear support hose. Elevate your feet for 15 minutes, 3-4 times a day. Limit salt in your diet. Prenatal Care  Schedule your prenatal visits by the twelfth week of pregnancy. They are usually scheduled monthly at first, then more often in the last 2 months before delivery.  Write down your questions. Take them to your prenatal visits.  Keep all your prenatal visits as  directed by your health care provider. Safety  Wear your seat belt at all times when driving.  Make a list of emergency phone numbers, including numbers for family, friends, the hospital, and police and fire departments. General Tips  Ask your health care provider for  a referral to a local prenatal education class. Begin classes no later than at the beginning of month 6 of your pregnancy.  Ask for help if you have counseling or nutritional needs during pregnancy. Your health care provider can offer advice or refer you to specialists for help with various needs.  Do not use hot tubs, steam rooms, or saunas.  Do not douche or use tampons or scented sanitary pads.  Do not cross your legs for long periods of time.  Avoid cat litter boxes and soil used by cats. These carry germs that can cause birth defects in the baby and possibly loss of the fetus by miscarriage or stillbirth.  Avoid all smoking, herbs, alcohol, and medicines not prescribed by your health care provider. Chemicals in these affect the formation and growth of the baby.  Do not use any tobacco products, including cigarettes, chewing tobacco, and electronic cigarettes. If you need help quitting, ask your health care provider. You may receive counseling support and other resources to help you quit.  Schedule a dentist appointment. At home, brush your teeth with a soft toothbrush and be gentle when you floss. SEEK MEDICAL CARE IF:   You have dizziness.  You have mild pelvic cramps, pelvic pressure, or nagging pain in the abdominal area.  You have persistent nausea, vomiting, or diarrhea.  You have a bad smelling vaginal discharge.  You have pain with urination.  You notice increased swelling in your face, hands, legs, or ankles. SEEK IMMEDIATE MEDICAL CARE IF:   You have a fever.  You are leaking fluid from your vagina.  You have spotting or bleeding from your vagina.  You have severe abdominal cramping or  pain.  You have rapid weight gain or loss.  You vomit blood or material that looks like coffee grounds.  You are exposed to [place] measles and have never had them.  You are exposed to fifth disease or chickenpox.  You develop a severe headache.  You have shortness of breath.  You have any kind of trauma, such as from a fall or a car accident.   This information is not intended to replace advice given to you by your health care provider. Make sure you discuss any questions you have with your health care provider.   Document Released: 10/11/2001 Document Revised: 11/07/2014 Document Reviewed: 08/27/2013 Elsevier Interactive Patient Education [employer].

## 2016-08-15 NOTE — MAU Provider Note (Signed)
History     CSN: [id number]  Arrival date and time: 08/15/16 2104   First Provider Initiated Contact with Patient 08/15/16 2151      Chief Complaint  Patient presents with  . Pelvic Pain   Pelvic Pain  The patient's primary symptoms include pelvic pain. This is a new problem. The current episode started yesterday. The problem occurs intermittently. The problem has been unchanged. Pain severity now: 5/10  The problem affects both sides. She is pregnant. Associated symptoms include abdominal pain. Pertinent negatives include no chills, constipation, diarrhea, dysuria, fever, frequency, nausea, urgency or vomiting. The vaginal bleeding is spotting. She has not been passing clots. She has not been passing tissue. Nothing aggravates the symptoms. She has tried nothing for the symptoms. She uses nothing for contraception. Her menstrual history has been regular (LMP 07/01/16 ).    History reviewed. No pertinent past medical history.  Past Surgical History:  Procedure Laterality Date  . CESAREAN SECTION    . FOOT SURGERY    . FOOT SURGERY      Family History  Problem Relation Age of Onset  . Diabetes Mother   . Anemia Mother   . Anemia Sister     Social History  Substance Use Topics  . Smoking status: Current Every Day Smoker    Packs/day: 0.30    Types: Cigarettes  . Smokeless tobacco: Never Used  . Alcohol use Yes     Comment: rare    Allergies: No Known Allergies  Prescriptions Prior to Admission  Medication Sig Dispense Refill Last Dose  . Multiple Vitamin (MULTIVITAMIN WITH MINERALS) TABS tablet Take 1 tablet by mouth daily.   08/15/2016 at Unknown time  . Multiple Vitamins-Minerals (MULTIVITAMIN GUMMIES ADULT PO) Take 2 each by mouth daily.   08/15/2016 at Unknown time    Review of Systems  Constitutional: Negative for chills and fever.  Gastrointestinal: Positive for abdominal pain. Negative for constipation, diarrhea, nausea and vomiting.  Genitourinary: Positive  for pelvic pain. Negative for dysuria, frequency and urgency.   Physical Exam   Last menstrual period 07/01/2016.  Physical Exam  Nursing note and vitals reviewed. Constitutional: She is oriented to person, place, and time. She appears well-developed and well-nourished. No distress.  HENT:  Head: Normocephalic.  Cardiovascular: Normal rate.   Respiratory: Effort normal.  GI: Soft. There is no tenderness. There is no rebound.  Genitourinary:  Genitourinary Comments:  External: no lesion Vagina: small amount of brown discharge Cervix: pink, smooth, no CMT Uterus: about 7 weeks size  Adnexa: NT   Neurological: She is alert and oriented to person, place, and time.  Skin: Skin is warm and dry.  Psychiatric: She has a normal mood and affect.    Results for orders placed or performed during the hospital encounter of 08/15/16 (from the past 24 hour(s))  Urinalysis, Routine w reflex microscopic (not at [facility])     Status: Abnormal   Collection Time: 08/15/16  9:20 PM  Result Value Ref Range   Color, Urine YELLOW YELLOW   APPearance CLEAR CLEAR   Specific Gravity, Urine 1.015 1.005 - 1.030   pH 7.5 5.0 - 8.0   Glucose, UA NEGATIVE NEGATIVE mg/dL   Hgb urine dipstick NEGATIVE NEGATIVE   Bilirubin Urine NEGATIVE NEGATIVE   Ketones, ur 15 (A) NEGATIVE mg/dL   Protein, ur NEGATIVE NEGATIVE mg/dL   Nitrite NEGATIVE NEGATIVE   Leukocytes, UA NEGATIVE NEGATIVE  Pregnancy, urine POC     Status: Abnormal   Collection  Time: 08/15/16  9:35 PM  Result Value Ref Range   Preg Test, Ur POSITIVE (A) NEGATIVE  Wet prep, genital     Status: Abnormal   Collection Time: 08/15/16 10:00 PM  Result Value Ref Range   Yeast Wet Prep HPF POC NONE SEEN NONE SEEN   Trich, Wet Prep NONE SEEN NONE SEEN   Clue Cells Wet Prep HPF POC PRESENT (A) NONE SEEN   WBC, Wet Prep HPF POC MODERATE (A) NONE SEEN   Sperm NONE SEEN   ABO/Rh     Status: None (Preliminary result)   Collection Time: 08/15/16 10:05 PM   Result Value Ref Range   ABO/RH(D) O POS   CBC     Status: None   Collection Time: 08/15/16 10:05 PM  Result Value Ref Range   WBC 7.5 4.0 - 10.5 K/uL   RBC 4.43 3.87 - 5.11 MIL/uL   Hemoglobin 12.7 12.0 - 15.0 g/dL   HCT 36.4 36.0 - 46.0 %   MCV 82.2 78.0 - 100.0 fL   MCH 28.7 26.0 - 34.0 pg   MCHC 34.9 30.0 - 36.0 g/dL   RDW 13.1 11.5 - 15.5 %   Platelets 212 150 - 400 K/uL  hCG, quantitative, pregnancy     Status: Abnormal   Collection Time: 08/15/16 10:05 PM  Result Value Ref Range   hCG, Beta Chain, Quant, S 102,047 (H) <5 mIU/mL   US Ob Comp Less 14 Wks  Result Date: 08/15/2016 CLINICAL DATA:  Acute onset of pelvic pain.  Initial encounter. EXAM: OBSTETRIC <14 WK [place] AND TRANSVAGINAL OB US TECHNIQUE: Both transabdominal and transvaginal ultrasound examinations were performed for complete evaluation of the gestation as well as the maternal uterus, adnexal regions, and pelvic cul-de-sac. Transvaginal technique was performed to assess early pregnancy. COMPARISON:  Pelvic ultrasound performed 09/16/2004 FINDINGS: Intrauterine gestational sac: Single; visualized and normal in shape. Yolk sac:  Yes Embryo:  Yes Cardiac Activity: Yes Heart Rate: 112  bpm CRL:  7.9  mm   6 w   4 d                  [place] EDC: 04/06/2017 Subchorionic hemorrhage:  None visualized. Maternal uterus/adnexae: The uterus is otherwise unremarkable. The ovaries are within normal limits. The right ovary measures 2.8 x 1.9 x 2.7 cm, while the left ovary measures 2.7 x 1.8 x 1.1 cm. No suspicious adnexal masses are seen; there is no evidence for ovarian torsion. No free fluid is seen within the pelvic cul-de-sac. IMPRESSION: Single live intrauterine pregnancy noted, with a crown-rump length of 8 mm, corresponding to a gestational age of [redacted] weeks 4 days. This matches the gestational age of [redacted] weeks 3 days by LMP, reflecting an estimated date of delivery of April 07, 2017. Electronically Signed   By: Garald Balding M.D.   On:  08/15/2016 23:03   US Ob Transvaginal  Result Date: 08/15/2016 CLINICAL DATA:  Acute onset of pelvic pain.  Initial encounter. EXAM: OBSTETRIC <14 WK [place] AND TRANSVAGINAL OB US TECHNIQUE: Both transabdominal and transvaginal ultrasound examinations were performed for complete evaluation of the gestation as well as the maternal uterus, adnexal regions, and pelvic cul-de-sac. Transvaginal technique was performed to assess early pregnancy. COMPARISON:  Pelvic ultrasound performed 09/16/2004 FINDINGS: Intrauterine gestational sac: Single; visualized and normal in shape. Yolk sac:  Yes Embryo:  Yes Cardiac Activity: Yes Heart Rate: 112  bpm CRL:  7.9  mm   6 w   4  d                  [place] EDC: 04/06/2017 Subchorionic hemorrhage:  None visualized. Maternal uterus/adnexae: The uterus is otherwise unremarkable. The ovaries are within normal limits. The right ovary measures 2.8 x 1.9 x 2.7 cm, while the left ovary measures 2.7 x 1.8 x 1.1 cm. No suspicious adnexal masses are seen; there is no evidence for ovarian torsion. No free fluid is seen within the pelvic cul-de-sac. IMPRESSION: Single live intrauterine pregnancy noted, with a crown-rump length of 8 mm, corresponding to a gestational age of [redacted] weeks 4 days. This matches the gestational age of [redacted] weeks 3 days by LMP, reflecting an estimated date of delivery of April 07, 2017. Electronically Signed   By: Garald Balding M.D.   On: 08/15/2016 23:03    MAU Course  Procedures  MDM   Assessment and Plan   1. [redacted] weeks gestation of pregnancy   2. Pelvic pain affecting pregnancy in first trimester, antepartum    DC home Comfort measures reviewed  1st Trimester precautions  RX: none  Return to MAU as needed [facility] as soon as possible   Follow-up Information    [facility] .   Contact information: [place] [address] [phone]            Mathis Bud 08/15/2016, 11:17 PM

## 2016-08-15 NOTE — MAU Note (Signed)
Pt presents complaining of lower abdominal pain and spotting when she wipes. States she had 3 positive pregnancy tests at home this week. LMP 07/01/16. Denies other abnormal discharge.

## 2016-08-16 LAB — HIV ANTIBODY (ROUTINE TESTING W REFLEX): HIV Screen 4th Generation wRfx: NONREACTIVE

## 2016-08-16 LAB — GC/CHLAMYDIA PROBE AMP (~~LOC~~) NOT AT ARMC
CHLAMYDIA, DNA PROBE: NEGATIVE
NEISSERIA GONORRHEA: NEGATIVE

## 2016-08-16 LAB — ABO/RH: ABO/RH(D): O POS

## 2016-08-16 LAB — RPR: RPR Ser Ql: NONREACTIVE

## 2016-08-28 ENCOUNTER — Encounter (HOSPITAL_COMMUNITY): Payer: Self-pay | Admitting: *Deleted

## 2016-08-28 ENCOUNTER — Inpatient Hospital Stay (HOSPITAL_COMMUNITY)
Admission: AD | Admit: 2016-08-28 | Discharge: 2016-08-28 | Disposition: A | Discharge status: Home / Self Care | Payer: Medicaid Other | Source: Ambulatory Visit | Attending: Obstetrics & Gynecology | Admitting: Obstetrics & Gynecology

## 2016-08-28 DIAGNOSIS — O99331 Smoking (tobacco) complicating pregnancy, first trimester: Secondary | ICD-10-CM | POA: Diagnosis not present

## 2016-08-28 DIAGNOSIS — Z3A08 8 weeks gestation of pregnancy: Secondary | ICD-10-CM | POA: Insufficient documentation

## 2016-08-28 DIAGNOSIS — O209 Hemorrhage in early pregnancy, unspecified: Secondary | ICD-10-CM | POA: Diagnosis not present

## 2016-08-28 DIAGNOSIS — R1084 Generalized abdominal pain: Secondary | ICD-10-CM

## 2016-08-28 DIAGNOSIS — F17210 Nicotine dependence, cigarettes, uncomplicated: Secondary | ICD-10-CM | POA: Diagnosis not present

## 2016-08-28 DIAGNOSIS — R109 Unspecified abdominal pain: Secondary | ICD-10-CM | POA: Diagnosis present

## 2016-08-28 HISTORY — DX: Other specified health status: Z78.9

## 2016-08-28 LAB — URINE MICROSCOPIC-ADD ON

## 2016-08-28 LAB — URINALYSIS, ROUTINE W REFLEX MICROSCOPIC
BILIRUBIN URINE: NEGATIVE
Glucose, UA: NEGATIVE mg/dL
Ketones, ur: NEGATIVE mg/dL
LEUKOCYTES UA: NEGATIVE
NITRITE: NEGATIVE
PH: 6 (ref 5.0–8.0)
Protein, ur: NEGATIVE mg/dL
SPECIFIC GRAVITY, URINE: 1.01 (ref 1.005–1.030)

## 2016-08-28 NOTE — MAU Provider Note (Signed)
Chief Complaint: Vaginal Bleeding   First Provider Initiated Contact with Patient 08/28/16 1920        SUBJECTIVE HPI: Pam Vargas is a 35 y.o. [id number] at [redacted]w[redacted]d by LMP who presents to maternity admissions reporting Sudden onset of middle abdominal pain.  Has been bleeding for a month with no etiology found. Had an US showing a viable SIUP with no [place].  States pain has now mostly subsided and she is passing gas. .She denies vaginal itching/burning, urinary symptoms, h/a, dizziness, n/v, or fever/chills.     Abdominal Pain  This is a new problem. The current episode started today. The onset quality is sudden. The problem occurs intermittently. The problem has been resolved. The pain is located in the periumbilical region. The quality of the pain is cramping. The abdominal pain does not radiate. Pertinent negatives include no constipation, diarrhea, fever, frequency, nausea or vomiting. Nothing aggravates the pain. The pain is relieved by passing flatus.   RN Note: Pt reports she started having pain in the bottom of her stomach today around 5:30 pm. Pt still having spotting. Has been spotting for about a month  Past Medical History:  Diagnosis Date  . Medical history non-contributory    Past Surgical History:  Procedure Laterality Date  . CESAREAN SECTION    . FOOT SURGERY    . FOOT SURGERY     Social History   Social History  . Marital status: Single    Spouse name: N/A  . Number of children: N/A  . Years of education: N/A   Occupational History  . Not on file.   Social History Main Topics  . Smoking status: Current Every Day Smoker    Packs/day: 0.30    Types: Cigarettes  . Smokeless tobacco: Never Used  . Alcohol use Yes     Comment: rare  . Drug use: No  . Sexual activity: Yes    Birth control/ protection: None   Other Topics Concern  . Not on file   Social History Narrative  . No narrative on file   No current facility-administered medications on file prior  to encounter.    Current Outpatient Prescriptions on File Prior to Encounter  Medication Sig Dispense Refill  . Multiple Vitamin (MULTIVITAMIN WITH MINERALS) TABS tablet Take 1 tablet by mouth daily.    . Multiple Vitamins-Minerals (MULTIVITAMIN GUMMIES ADULT PO) Take 2 each by mouth daily.     No Known Allergies  I have reviewed patient's Past Medical Hx, Surgical Hx, Family Hx, Social Hx, medications and allergies.   ROS:  Review of Systems  Constitutional: Negative for fever.  Gastrointestinal: Positive for abdominal pain. Negative for constipation, diarrhea, nausea and vomiting.  Genitourinary: Negative for frequency, pelvic pain, vaginal bleeding, vaginal discharge and vaginal pain.  Neurological: Negative for dizziness.   Review of Systems  Other systems negative   Physical Exam  Patient Vitals for the past 24 hrs:  BP Temp Temp src Pulse Resp  08/28/16 1904 (!) 108/43 99 F (37.2 C) Oral 90 18   Physical Exam  Constitutional: Well-developed, well-nourished female in no acute distress.  Cardiovascular: normal rate Respiratory: normal effort GI: Abd soft, non-tender. Pos BS x 4   No guarding or rebound.  States pain has resolved MS: Extremities nontender, no edema, normal ROM Neurologic: Alert and oriented x 4.  GU: Neg CVAT.  PELVIC EXAM: Cervix long and closed  I did a bedside US to determine fetal viability Normal appearing gestational sac Fetal  pole with CRL c/w [redacted]w[redacted]d FHR 160s Normal yolk sac  LAB RESULTS Results for orders placed or performed during the hospital encounter of 08/28/16 (from the past 24 hour(s))  Urinalysis, Routine w reflex microscopic (not at [facility])     Status: Abnormal   Collection Time: 08/28/16  6:55 PM  Result Value Ref Range   Color, Urine YELLOW YELLOW   APPearance CLEAR CLEAR   Specific Gravity, Urine 1.010 1.005 - 1.030   pH 6.0 5.0 - 8.0   Glucose, UA NEGATIVE NEGATIVE mg/dL   Hgb urine dipstick MODERATE (A) NEGATIVE    Bilirubin Urine NEGATIVE NEGATIVE   Ketones, ur NEGATIVE NEGATIVE mg/dL   Protein, ur NEGATIVE NEGATIVE mg/dL   Nitrite NEGATIVE NEGATIVE   Leukocytes, UA NEGATIVE NEGATIVE  Urine microscopic-add on     Status: Abnormal   Collection Time: 08/28/16  6:55 PM  Result Value Ref Range   Squamous Epithelial / LPF 0-5 (A) NONE SEEN   WBC, UA 0-5 0 - 5 WBC/hpf   RBC / HPF 0-5 0 - 5 RBC/hpf   Bacteria, UA RARE (A) NONE SEEN    --/--/O POS (10/16 2205)  IMAGING US Ob Comp Less 14 Wks  Result Date: 08/15/2016 CLINICAL DATA:  Acute onset of pelvic pain.  Initial encounter. EXAM: OBSTETRIC <14 WK [place] AND TRANSVAGINAL OB US TECHNIQUE: Both transabdominal and transvaginal ultrasound examinations were performed for complete evaluation of the gestation as well as the maternal uterus, adnexal regions, and pelvic cul-de-sac. Transvaginal technique was performed to assess early pregnancy. COMPARISON:  Pelvic ultrasound performed 09/16/2004 FINDINGS: Intrauterine gestational sac: Single; visualized and normal in shape. Yolk sac:  Yes Embryo:  Yes Cardiac Activity: Yes Heart Rate: 112  bpm CRL:  7.9  mm   6 w   4 d                  [place] EDC: 04/06/2017 Subchorionic hemorrhage:  None visualized. Maternal uterus/adnexae: The uterus is otherwise unremarkable. The ovaries are within normal limits. The right ovary measures 2.8 x 1.9 x 2.7 cm, while the left ovary measures 2.7 x 1.8 x 1.1 cm. No suspicious adnexal masses are seen; there is no evidence for ovarian torsion. No free fluid is seen within the pelvic cul-de-sac. IMPRESSION: Single live intrauterine pregnancy noted, with a crown-rump length of 8 mm, corresponding to a gestational age of [redacted] weeks 4 days. This matches the gestational age of [redacted] weeks 3 days by LMP, reflecting an estimated date of delivery of April 07, 2017. Electronically Signed   By: Garald Balding M.D.   On: 08/15/2016 23:03   US Ob Transvaginal  Result Date: 08/15/2016 CLINICAL DATA:  Acute onset of  pelvic pain.  Initial encounter. EXAM: OBSTETRIC <14 WK [place] AND TRANSVAGINAL OB US TECHNIQUE: Both transabdominal and transvaginal ultrasound examinations were performed for complete evaluation of the gestation as well as the maternal uterus, adnexal regions, and pelvic cul-de-sac. Transvaginal technique was performed to assess early pregnancy. COMPARISON:  Pelvic ultrasound performed 09/16/2004 FINDINGS: Intrauterine gestational sac: Single; visualized and normal in shape. Yolk sac:  Yes Embryo:  Yes Cardiac Activity: Yes Heart Rate: 112  bpm CRL:  7.9  mm   6 w   4 d                  [place] EDC: 04/06/2017 Subchorionic hemorrhage:  None visualized. Maternal uterus/adnexae: The uterus is otherwise unremarkable. The ovaries are within normal limits. The right ovary measures 2.8  x 1.9 x 2.7 cm, while the left ovary measures 2.7 x 1.8 x 1.1 cm. No suspicious adnexal masses are seen; there is no evidence for ovarian torsion. No free fluid is seen within the pelvic cul-de-sac. IMPRESSION: Single live intrauterine pregnancy noted, with a crown-rump length of 8 mm, corresponding to a gestational age of [redacted] weeks 4 days. This matches the gestational age of [redacted] weeks 3 days by LMP, reflecting an estimated date of delivery of April 07, 2017. Electronically Signed   By: Garald Balding M.D.   On: 08/15/2016 23:03    MAU Management/MDM: Has already had a full workup and single IUP was confirmed Will do a bedside US to confirm viability  >> normal live fetus  This bleeding/pain can represent a normal pregnancy with bleeding, spontaneous abortion or even an ectopic which can be life-threatening.  The process as listed above helps to determine which of these is present.    ASSESSMENT Single intrauterine pregnancy at [redacted]w[redacted]d, live Abdominal pain, now resolved with passing flatus No evidence of cervical shortening First trimester bleeding of unknown etiology  PLAN Discharge home Reassured this was probably GI gas pain Fetus is  healthy and has a good heartbeat Plan follow up next week for new ob appt  Pt stable at time of discharge. Encouraged to return here or to other Urgent Care/ED if she develops worsening of symptoms, increase in pain, fever, or other concerning symptoms.    Hansel Feinstein CNM, MSN Certified Nurse-Midwife 08/28/2016  7:51 PM

## 2016-08-28 NOTE — Discharge Instructions (Signed)

## 2016-08-28 NOTE — MAU Note (Signed)
Pt reports she started having pain in the bottom of her stomach today around 5:30 pm. Pt still having spotting. Has been spotting for about a month.

## 2016-09-02 ENCOUNTER — Inpatient Hospital Stay (HOSPITAL_COMMUNITY)
Admission: AD | Admit: 2016-09-02 | Discharge: 2016-09-02 | Disposition: A | Discharge status: Home / Self Care | Payer: Medicaid Other | Source: Ambulatory Visit | Attending: Obstetrics and Gynecology | Admitting: Obstetrics and Gynecology

## 2016-09-02 ENCOUNTER — Encounter (HOSPITAL_COMMUNITY): Payer: Self-pay | Admitting: *Deleted

## 2016-09-02 ENCOUNTER — Inpatient Hospital Stay (HOSPITAL_COMMUNITY): Payer: Medicaid Other

## 2016-09-02 DIAGNOSIS — F17210 Nicotine dependence, cigarettes, uncomplicated: Secondary | ICD-10-CM | POA: Diagnosis not present

## 2016-09-02 DIAGNOSIS — O99331 Smoking (tobacco) complicating pregnancy, first trimester: Secondary | ICD-10-CM | POA: Insufficient documentation

## 2016-09-02 DIAGNOSIS — O208 Other hemorrhage in early pregnancy: Secondary | ICD-10-CM | POA: Diagnosis not present

## 2016-09-02 DIAGNOSIS — Z3A09 9 weeks gestation of pregnancy: Secondary | ICD-10-CM | POA: Insufficient documentation

## 2016-09-02 DIAGNOSIS — O3680X0 Pregnancy with inconclusive fetal viability, not applicable or unspecified: Secondary | ICD-10-CM

## 2016-09-02 DIAGNOSIS — O209 Hemorrhage in early pregnancy, unspecified: Secondary | ICD-10-CM | POA: Diagnosis present

## 2016-09-02 LAB — URINALYSIS, ROUTINE W REFLEX MICROSCOPIC
Bilirubin Urine: NEGATIVE
Glucose, UA: NEGATIVE mg/dL
Ketones, ur: NEGATIVE mg/dL
LEUKOCYTES UA: NEGATIVE
NITRITE: NEGATIVE
PROTEIN: NEGATIVE mg/dL
pH: 6 (ref 5.0–8.0)

## 2016-09-02 LAB — URINE MICROSCOPIC-ADD ON
BACTERIA UA: NONE SEEN
WBC UA: NONE SEEN WBC/hpf (ref 0–5)

## 2016-09-02 NOTE — MAU Note (Signed)
Patient was at work, passed a lot of blood, passed a big clot, no pain.

## 2016-09-02 NOTE — Discharge Instructions (Signed)

## 2016-09-02 NOTE — MAU Provider Note (Signed)
  History     CSN: [id number]  Arrival date and time: 09/02/16 1726   None     Chief Complaint  Patient presents with  . Vaginal Bleeding   HPI  [name] is a 35 yo [id number] IUP 9 0/7 weeks by LMP and confirmed by first trimester U/S. Presents to MAU with sudden onset of heavy bleeding around 5. 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 She denies any vaginal pressure or cramps.  She has been seen in MAU for cramps during this pregnancy but fetal surveillance has been reassuring. She also reports some degree of VB off/on with this pregnancy. This evenings bleeding has been the heaviest.  No recent Ic. No vaginal d/c. Has not started [facility] but has appt at [facility] Has started PNV. She does smoke but has decreased to 1/2 ppd.      Past Medical History:  Diagnosis Date  . Medical history non-contributory     Past Surgical History:  Procedure Laterality Date  . CESAREAN SECTION    . FOOT SURGERY    . FOOT SURGERY      Family History  Problem Relation Age of Onset  . Diabetes Mother   . Anemia Mother   . Anemia Sister     Social History  Substance Use Topics  . Smoking status: Current Every Day Smoker    Packs/day: 0.30    Types: Cigarettes  . Smokeless tobacco: Never Used  . Alcohol use Yes     Comment: rare    Allergies: No Known Allergies  Prescriptions Prior to Admission  Medication Sig Dispense Refill Last Dose  . calcium carbonate (TUMS - DOSED IN MG ELEMENTAL CALCIUM) 500 MG chewable tablet Chew 2 tablets by mouth as needed for indigestion or heartburn.   Past Week at Unknown time  . Prenatal MV-Min-FA-Omega-3 (PRENATAL GUMMIES/DHA & FA) 0.4-32.5 MG CHEW Chew 2 each by mouth daily.   09/02/2016 at Unknown time    Review of Systems  Constitutional: Negative.   Respiratory: Negative.   Cardiovascular: Negative.   Gastrointestinal:  Negative.   Genitourinary: Negative.    Physical Exam   Blood pressure 110/60, pulse 89, temperature 98.5 F (36.9 C), temperature source Oral, resp. rate 18, last menstrual period 07/01/2016, SpO2 100 %.  Physical Exam  Constitutional: She appears well-developed and well-nourished.  Cardiovascular: Normal rate and regular rhythm.   Respiratory: Effort normal and breath sounds normal.  GI: Soft. Bowel sounds are normal.  Genitourinary:  Genitourinary Comments: Nl EGBUS, blood in vaginal vault, small clot at cervical os, os ft, uterus < 10 week size, no CMT and no adnexal masses or tenderness    MAU Course  Procedures Blood type O +. U/S ordered MDM U/S + Cardiac activity [facility] noted  Assessment and Plan  IUP 9 wks First trimester bleeding secondary to [place]  Pt reassured. Recommended pelvic rest. Continue with PNV. Keep OB appt on 09/12/16  Chancy Milroy 09/02/2016, 6:11 PM

## 2016-09-12 ENCOUNTER — Encounter: Payer: Self-pay | Admitting: Obstetrics and Gynecology

## 2016-09-12 ENCOUNTER — Ambulatory Visit (INDEPENDENT_AMBULATORY_CARE_PROVIDER_SITE_OTHER): Payer: Medicaid Other | Admitting: Obstetrics and Gynecology

## 2016-09-12 ENCOUNTER — Other Ambulatory Visit (HOSPITAL_COMMUNITY)
Admission: RE | Admit: 2016-09-12 | Discharge: 2016-09-12 | Disposition: A | Discharge status: Home / Self Care | Payer: Medicaid Other | Source: Ambulatory Visit | Attending: Obstetrics and Gynecology | Admitting: Obstetrics and Gynecology

## 2016-09-12 DIAGNOSIS — Z124 Encounter for screening for malignant neoplasm of cervix: Secondary | ICD-10-CM

## 2016-09-12 DIAGNOSIS — Z23 Encounter for immunization: Secondary | ICD-10-CM | POA: Diagnosis not present

## 2016-09-12 DIAGNOSIS — O99331 Smoking (tobacco) complicating pregnancy, first trimester: Secondary | ICD-10-CM

## 2016-09-12 DIAGNOSIS — O34219 Maternal care for unspecified type scar from previous cesarean delivery: Secondary | ICD-10-CM

## 2016-09-12 DIAGNOSIS — O09219 Supervision of pregnancy with history of pre-term labor, unspecified trimester: Secondary | ICD-10-CM

## 2016-09-12 DIAGNOSIS — Z8751 Personal history of pre-term labor: Secondary | ICD-10-CM | POA: Insufficient documentation

## 2016-09-12 DIAGNOSIS — Z1151 Encounter for screening for human papillomavirus (HPV): Secondary | ICD-10-CM

## 2016-09-12 DIAGNOSIS — Z01419 Encounter for gynecological examination (general) (routine) without abnormal findings: Secondary | ICD-10-CM | POA: Diagnosis present

## 2016-09-12 DIAGNOSIS — Z98891 History of uterine scar from previous surgery: Secondary | ICD-10-CM | POA: Insufficient documentation

## 2016-09-12 DIAGNOSIS — O09521 Supervision of elderly multigravida, first trimester: Secondary | ICD-10-CM | POA: Diagnosis not present

## 2016-09-12 DIAGNOSIS — Z3490 Encounter for supervision of normal pregnancy, unspecified, unspecified trimester: Secondary | ICD-10-CM | POA: Insufficient documentation

## 2016-09-12 DIAGNOSIS — Z113 Encounter for screening for infections with a predominantly sexual mode of transmission: Secondary | ICD-10-CM | POA: Diagnosis present

## 2016-09-12 DIAGNOSIS — O99330 Smoking (tobacco) complicating pregnancy, unspecified trimester: Secondary | ICD-10-CM | POA: Insufficient documentation

## 2016-09-12 DIAGNOSIS — O09529 Supervision of elderly multigravida, unspecified trimester: Secondary | ICD-10-CM | POA: Insufficient documentation

## 2016-09-12 DIAGNOSIS — O09899 Supervision of other high risk pregnancies, unspecified trimester: Secondary | ICD-10-CM

## 2016-09-12 NOTE — Patient Instructions (Addendum)
Smoking Cessation, Tips for Success If you are ready to quit smoking, congratulations! You have chosen to help yourself be healthier. Cigarettes bring nicotine, tar, carbon monoxide, and other irritants into your body. Your lungs, heart, and blood vessels will be able to work better without these poisons. There are many different ways to quit smoking. Nicotine gum, nicotine patches, a nicotine inhaler, or nicotine nasal spray can help with physical craving. Hypnosis, support groups, and medicines help break the habit of smoking. WHAT THINGS CAN I DO TO MAKE QUITTING EASIER?  Here are some tips to help you quit for good:  Pick a date when you will quit smoking completely. Tell all of your friends and family about your plan to quit on that date.  Do not try to slowly cut down on the number of cigarettes you are smoking. Pick a quit date and quit smoking completely starting on that day.  Throw away all cigarettes.   Clean and remove all ashtrays from your home, work, and car.  On a card, write down your reasons for quitting. Carry the card with you and read it when you get the urge to smoke.  Cleanse your body of nicotine. Drink enough water and fluids to keep your urine clear or pale yellow. Do this after quitting to flush the nicotine from your body.  Learn to predict your moods. Do not let a bad situation be your excuse to have a cigarette. Some situations in your life might tempt you into wanting a cigarette.  Never have "just one" cigarette. It leads to wanting another and another. Remind yourself of your decision to quit.  Change habits associated with smoking. If you smoked while driving or when feeling stressed, try other activities to replace smoking. Stand up when drinking your coffee. Brush your teeth after eating. Sit in a different chair when you read the paper. Avoid alcohol while trying to quit, and try to drink fewer caffeinated beverages. Alcohol and caffeine may urge you to  smoke.  Avoid foods and drinks that can trigger a desire to smoke, such as sugary or spicy foods and alcohol.  Ask people who smoke not to smoke around you.  Have something planned to do right after eating or having a cup of coffee. For example, plan to take a walk or exercise.  Try a relaxation exercise to calm you down and decrease your stress. Remember, you may be tense and nervous for the first 2 weeks after you quit, but this will pass.  Find new activities to keep your hands busy. Play with a pen, coin, or rubber band. Doodle or draw things on paper.  Brush your teeth right after eating. This will help cut down on the craving for the taste of tobacco after meals. You can also try mouthwash.   Use oral substitutes in place of cigarettes. Try using lemon drops, carrots, cinnamon sticks, or chewing gum. Keep them handy so they are available when you have the urge to smoke.  When you have the urge to smoke, try deep breathing.  Designate your home as a nonsmoking area.  If you are a heavy smoker, ask your health care provider about a prescription for nicotine chewing gum. It can ease your withdrawal from nicotine.  Reward yourself. Set aside the cigarette money you save and buy yourself something nice.  Look for support from others. Join a support group or smoking cessation program. Ask someone at home or at work to help you with your plan   to quit smoking.  Always ask yourself, "Do I need this cigarette or is this just a reflex?" Tell yourself, "Today, I choose not to smoke," or "I do not want to smoke." You are reminding yourself of your decision to quit.  Do not replace cigarette smoking with electronic cigarettes (commonly called e-cigarettes). The safety of e-cigarettes is unknown, and some may contain harmful chemicals.  If you relapse, do not give up! Plan ahead and think about what you will do the next time you get the urge to smoke. HOW WILL I FEEL WHEN I QUIT SMOKING? You  may have symptoms of withdrawal because your body is used to nicotine (the addictive substance in cigarettes). You may crave cigarettes, be irritable, feel very hungry, cough often, get headaches, or have difficulty concentrating. The withdrawal symptoms are only temporary. They are strongest when you first quit but will go away within 10-14 days. When withdrawal symptoms occur, stay in control. Think about your reasons for quitting. Remind yourself that these are signs that your body is healing and getting used to being without cigarettes. Remember that withdrawal symptoms are easier to treat than the major diseases that smoking can cause.  Even after the withdrawal is over, expect periodic urges to smoke. However, these cravings are generally short lived and will go away whether you smoke or not. Do not smoke! WHAT RESOURCES ARE AVAILABLE TO HELP ME QUIT SMOKING? Your health care provider can direct you to community resources or hospitals for support, which may include:  Group support.  Education.  Hypnosis.  Therapy.   This information is not intended to replace advice given to you by your health care provider. Make sure you discuss any questions you have with your health care provider.   Document Released: 07/15/2004 Document Revised: 11/07/2014 Document Reviewed: 04/04/2013 Elsevier Interactive Patient Education 2016 [place] of Pregnancy The first trimester of pregnancy is from week 1 until the end of week 12 (months 1 through 3). A week after a sperm fertilizes an egg, the egg will implant on the wall of the uterus. This embryo will begin to develop into a baby. Genes from you and your partner are forming the baby. The female genes determine whether the baby is a boy or a girl. At 6-8 weeks, the eyes and face are formed, and the heartbeat can be seen on ultrasound. At the end of 12 weeks, all the baby's organs are formed.  Now that you are pregnant, you will want to  do everything you can to have a healthy baby. Two of the most important things are to get good prenatal care and to follow your health care provider's instructions. Prenatal care is all the medical care you receive before the baby's birth. This care will help prevent, find, and treat any problems during the pregnancy and childbirth. BODY CHANGES Your body goes through many changes during pregnancy. The changes vary from woman to woman.   You may gain or lose a couple of pounds at first.  You may feel sick to your stomach (nauseous) and throw up (vomit). If the vomiting is uncontrollable, call your health care provider.  You may tire easily.  You may develop headaches that can be relieved by medicines approved by your health care provider.  You may urinate more often. Painful urination may mean you have a bladder infection.  You may develop heartburn as a result of your pregnancy.  You may develop constipation because certain hormones are  causing the muscles that push waste through your intestines to slow down.  You may develop hemorrhoids or swollen, bulging veins (varicose veins).  Your breasts may begin to grow larger and become tender. Your nipples may stick out more, and the tissue that surrounds them (areola) may become darker.  Your gums may bleed and may be sensitive to brushing and flossing.  Dark spots or blotches (chloasma, mask of pregnancy) may develop on your face. This will likely fade after the baby is born.  Your menstrual periods will stop.  You may have a loss of appetite.  You may develop cravings for certain kinds of food.  You may have changes in your emotions from day to day, such as being excited to be pregnant or being concerned that something may go wrong with the pregnancy and baby.  You may have more vivid and strange dreams.  You may have changes in your hair. These can include thickening of your hair, rapid growth, and changes in texture. Some women  also have hair loss during or after pregnancy, or hair that feels dry or thin. Your hair will most likely return to normal after your baby is born. WHAT TO EXPECT AT YOUR PRENATAL VISITS During a routine prenatal visit:  You will be weighed to make sure you and the baby are growing normally.  Your blood pressure will be taken.  Your abdomen will be measured to track your baby's growth.  The fetal heartbeat will be listened to starting around week 10 or 12 of your pregnancy.  Test results from any previous visits will be discussed. Your health care provider may ask you:  How you are feeling.  If you are feeling the baby move.  If you have had any abnormal symptoms, such as leaking fluid, bleeding, severe headaches, or abdominal cramping.  If you are using any tobacco products, including cigarettes, chewing tobacco, and electronic cigarettes.  If you have any questions. Other tests that may be performed during your first trimester include:  Blood tests to find your blood type and to check for the presence of any previous infections. They will also be used to check for low iron levels (anemia) and Rh antibodies. Later in the pregnancy, blood tests for diabetes will be done along with other tests if problems develop.  Urine tests to check for infections, diabetes, or protein in the urine.  An ultrasound to confirm the proper growth and development of the baby.  An amniocentesis to check for possible genetic problems.  Fetal screens for spina bifida and Down syndrome.  You may need other tests to make sure you and the baby are doing well.  HIV (human immunodeficiency virus) testing. Routine prenatal testing includes screening for HIV, unless you choose not to have this test. HOME CARE INSTRUCTIONS  Medicines  Follow your health care provider's instructions regarding medicine use. Specific medicines may be either safe or unsafe to take during pregnancy.  Take your prenatal  vitamins as directed.  If you develop constipation, try taking a stool softener if your health care provider approves. Diet  Eat regular, well-balanced meals. Choose a variety of foods, such as meat or vegetable-based protein, fish, milk and low-fat dairy products, vegetables, fruits, and whole grain breads and cereals. Your health care provider will help you determine the amount of weight gain that is right for you.  Avoid raw meat and uncooked cheese. These carry germs that can cause birth defects in the baby.  Eating four or five  small meals rather than three large meals a day may help relieve nausea and vomiting. If you start to feel nauseous, eating a few soda crackers can be helpful. Drinking liquids between meals instead of during meals also seems to help nausea and vomiting.  If you develop constipation, eat more high-fiber foods, such as fresh vegetables or fruit and whole grains. Drink enough fluids to keep your urine clear or pale yellow. Activity and Exercise  Exercise only as directed by your health care provider. Exercising will help you:  Control your weight.  Stay in shape.  Be prepared for labor and delivery.  Experiencing pain or cramping in the lower abdomen or low back is a good sign that you should stop exercising. Check with your health care provider before continuing normal exercises.  Try to avoid standing for long periods of time. Move your legs often if you must stand in one place for a long time.  Avoid heavy lifting.  Wear low-heeled shoes, and practice good posture.  You may continue to have sex unless your health care provider directs you otherwise. Relief of Pain or Discomfort  Wear a good support bra for breast tenderness.   Take warm sitz baths to soothe any pain or discomfort caused by hemorrhoids. Use hemorrhoid cream if your health care provider approves.   Rest with your legs elevated if you have leg cramps or low back pain.  If you  develop varicose veins in your legs, wear support hose. Elevate your feet for 15 minutes, 3-4 times a day. Limit salt in your diet. Prenatal Care  Schedule your prenatal visits by the twelfth week of pregnancy. They are usually scheduled monthly at first, then more often in the last 2 months before delivery.  Write down your questions. Take them to your prenatal visits.  Keep all your prenatal visits as directed by your health care provider. Safety  Wear your seat belt at all times when driving.  Make a list of emergency phone numbers, including numbers for family, friends, the hospital, and police and fire departments. General Tips  Ask your health care provider for a referral to a local prenatal education class. Begin classes no later than at the beginning of month 6 of your pregnancy.  Ask for help if you have counseling or nutritional needs during pregnancy. Your health care provider can offer advice or refer you to specialists for help with various needs.  Do not use hot tubs, steam rooms, or saunas.  Do not douche or use tampons or scented sanitary pads.  Do not cross your legs for long periods of time.  Avoid cat litter boxes and soil used by cats. These carry germs that can cause birth defects in the baby and possibly loss of the fetus by miscarriage or stillbirth.  Avoid all smoking, herbs, alcohol, and medicines not prescribed by your health care provider. Chemicals in these affect the formation and growth of the baby.  Do not use any tobacco products, including cigarettes, chewing tobacco, and electronic cigarettes. If you need help quitting, ask your health care provider. You may receive counseling support and other resources to help you quit.  Schedule a dentist appointment. At home, brush your teeth with a soft toothbrush and be gentle when you floss. SEEK MEDICAL CARE IF:   You have dizziness.  You have mild pelvic cramps, pelvic pressure, or nagging pain in the  abdominal area.  You have persistent nausea, vomiting, or diarrhea.  You have a bad smelling  vaginal discharge.  You have pain with urination.  You notice increased swelling in your face, hands, legs, or ankles. SEEK IMMEDIATE MEDICAL CARE IF:   You have a fever.  You are leaking fluid from your vagina.  You have spotting or bleeding from your vagina.  You have severe abdominal cramping or pain.  You have rapid weight gain or loss.  You vomit blood or material that looks like coffee grounds.  You are exposed to [place] measles and have never had them.  You are exposed to fifth disease or chickenpox.  You develop a severe headache.  You have shortness of breath.  You have any kind of trauma, such as from a fall or a car accident.   This information is not intended to replace advice given to you by your health care provider. Make sure you discuss any questions you have with your health care provider.   Document Released: 10/11/2001 Document Revised: 11/07/2014 Document Reviewed: 08/27/2013 Elsevier Interactive Patient Education [employer].  Contraception Choices Contraception (birth control) is the use of any methods or devices to prevent pregnancy. Below are some methods to help avoid pregnancy. HORMONAL METHODS   Contraceptive implant. This is a thin, plastic tube containing progesterone hormone. It does not contain estrogen hormone. Your health care provider inserts the tube in the inner part of the upper arm. The tube can remain in place for up to 3 years. After 3 years, the implant must be removed. The implant prevents the ovaries from releasing an egg (ovulation), thickens the cervical mucus to prevent sperm from entering the uterus, and thins the lining of the inside of the uterus.  Progesterone-only injections. These injections are given every 3 months by your health care provider to prevent pregnancy. This synthetic progesterone hormone stops the ovaries from  releasing eggs. It also thickens cervical mucus and changes the uterine lining. This makes it harder for sperm to survive in the uterus.  Birth control pills. These pills contain estrogen and progesterone hormone. They work by preventing the ovaries from releasing eggs (ovulation). They also cause the cervical mucus to thicken, preventing the sperm from entering the uterus. Birth control pills are prescribed by a health care provider.Birth control pills can also be used to treat heavy periods.  Minipill. This type of birth control pill contains only the progesterone hormone. They are taken every day of each month and must be prescribed by your health care provider.  Birth control patch. The patch contains hormones similar to those in birth control pills. It must be changed once a week and is prescribed by a health care provider.  Vaginal ring. The ring contains hormones similar to those in birth control pills. It is left in the vagina for 3 weeks, removed for 1 week, and then a new one is put back in place. The patient must be comfortable inserting and removing the ring from the vagina.A health care provider's prescription is necessary.  Emergency contraception. Emergency contraceptives prevent pregnancy after unprotected sexual intercourse. This pill can be taken right after sex or up to 5 days after unprotected sex. It is most effective the sooner you take the pills after having sexual intercourse. Most emergency contraceptive pills are available without a prescription. Check with your pharmacist. Do not use emergency contraception as your only form of birth control. BARRIER METHODS   Female condom. This is a thin sheath (latex or rubber) that is worn over the penis during sexual intercourse. It can be used with  spermicide to increase effectiveness.  Female condom. This is a soft, loose-fitting sheath that is put into the vagina before sexual intercourse.  Diaphragm. This is a soft, latex,  dome-shaped barrier that must be fitted by a health care provider. It is inserted into the vagina, along with a spermicidal jelly. It is inserted before intercourse. The diaphragm should be left in the vagina for 6 to 8 hours after intercourse.  Cervical cap. This is a round, soft, latex or plastic cup that fits over the cervix and must be fitted by a health care provider. The cap can be left in place for up to 48 hours after intercourse.  Sponge. This is a soft, circular piece of polyurethane foam. The sponge has spermicide in it. It is inserted into the vagina after wetting it and before sexual intercourse.  Spermicides. These are chemicals that kill or block sperm from entering the cervix and uterus. They come in the form of creams, jellies, suppositories, foam, or tablets. They do not require a prescription. They are inserted into the vagina with an applicator before having sexual intercourse. The process must be repeated every time you have sexual intercourse. INTRAUTERINE CONTRACEPTION  Intrauterine device (IUD). This is a T-shaped device that is put in a woman's uterus during a menstrual period to prevent pregnancy. There are 2 types:  Copper IUD. This type of IUD is wrapped in copper wire and is placed inside the uterus. Copper makes the uterus and fallopian tubes produce a fluid that kills sperm. It can stay in place for 10 years.  Hormone IUD. This type of IUD contains the hormone progestin (synthetic progesterone). The hormone thickens the cervical mucus and prevents sperm from entering the uterus, and it also thins the uterine lining to prevent implantation of a fertilized egg. The hormone can weaken or kill the sperm that get into the uterus. It can stay in place for 3-5 years, depending on which type of IUD is used. PERMANENT METHODS OF CONTRACEPTION  Female tubal ligation. This is when the woman's fallopian tubes are surgically sealed, tied, or blocked to prevent the egg from traveling  to the uterus.  Hysteroscopic sterilization. This involves placing a small coil or insert into each fallopian tube. Your doctor uses a technique called hysteroscopy to do the procedure. The device causes scar tissue to form. This results in permanent blockage of the fallopian tubes, so the sperm cannot fertilize the egg. It takes about 3 months after the procedure for the tubes to become blocked. You must use another form of birth control for these 3 months.  Female sterilization. This is when the female has the tubes that carry sperm tied off (vasectomy).This blocks sperm from entering the vagina during sexual intercourse. After the procedure, the man can still ejaculate fluid (semen). NATURAL PLANNING METHODS  Natural family planning. This is not having sexual intercourse or using a barrier method (condom, diaphragm, cervical cap) on days the woman could become pregnant.  Calendar method. This is keeping track of the length of each menstrual cycle and identifying when you are fertile.  Ovulation method. This is avoiding sexual intercourse during ovulation.  Symptothermal method. This is avoiding sexual intercourse during ovulation, using a thermometer and ovulation symptoms.  Post-ovulation method. This is timing sexual intercourse after you have ovulated. Regardless of which type or method of contraception you choose, it is important that you use condoms to protect against the transmission of sexually transmitted infections (STIs). Talk with your health care provider about  which form of contraception is most appropriate for you.   This information is not intended to replace advice given to you by your health care provider. Make sure you discuss any questions you have with your health care provider.   Document Released: 10/17/2005 Document Revised: 10/22/2013 Document Reviewed: 04/11/2013 Elsevier Interactive Patient Education [employer].   Breastfeeding Deciding to breastfeed is one  of the best choices you can make for you and your baby. A change in hormones during pregnancy causes your breast tissue to grow and increases the number and size of your milk ducts. These hormones also allow proteins, sugars, and fats from your blood supply to make breast milk in your milk-producing glands. Hormones prevent breast milk from being released before your baby is born as well as prompt milk flow after birth. Once breastfeeding has begun, thoughts of your baby, as well as his or her sucking or crying, can stimulate the release of milk from your milk-producing glands.  BENEFITS OF BREASTFEEDING For Your Baby  Your first milk (colostrum) helps your baby's digestive system function better.  There are antibodies in your milk that help your baby fight off infections.  Your baby has a lower incidence of asthma, allergies, and sudden infant death syndrome.  The nutrients in breast milk are better for your baby than infant formulas and are designed uniquely for your baby's needs.  Breast milk improves your baby's brain development.  Your baby is less likely to develop other conditions, such as childhood obesity, asthma, or type 2 diabetes mellitus. For You  Breastfeeding helps to create a very special bond between you and your baby.  Breastfeeding is convenient. Breast milk is always available at the correct temperature and costs nothing.  Breastfeeding helps to burn calories and helps you lose the weight gained during pregnancy.  Breastfeeding makes your uterus contract to its prepregnancy size faster and slows bleeding (lochia) after you give birth.   Breastfeeding helps to lower your risk of developing type 2 diabetes mellitus, osteoporosis, and breast or ovarian cancer later in life. SIGNS THAT YOUR BABY IS HUNGRY Early Signs of Hunger  Increased alertness or activity.  Stretching.  Movement of the head from side to side.  Movement of the head and opening of the mouth when  the corner of the mouth or cheek is stroked (rooting).  Increased sucking sounds, smacking lips, cooing, sighing, or squeaking.  Hand-to-mouth movements.  Increased sucking of fingers or hands. Late Signs of Hunger  Fussing.  Intermittent crying. Extreme Signs of Hunger Signs of extreme hunger will require calming and consoling before your baby will be able to breastfeed successfully. Do not wait for the following signs of extreme hunger to occur before you initiate breastfeeding:  Restlessness.  A loud, strong cry.  Screaming. BREASTFEEDING BASICS Breastfeeding Initiation  Find a comfortable place to sit or lie down, with your neck and back well supported.  Place a pillow or rolled up blanket under your baby to bring him or her to the level of your breast (if you are seated). Nursing pillows are specially designed to help support your arms and your baby while you breastfeed.  Make sure that your baby's abdomen is facing your abdomen.  Gently massage your breast. With your fingertips, massage from your chest wall toward your nipple in a circular motion. This encourages milk flow. You may need to continue this action during the feeding if your milk flows slowly.  Support your breast with 4 fingers underneath  and your thumb above your nipple. Make sure your fingers are well away from your nipple and your baby's mouth.  Stroke your baby's lips gently with your finger or nipple.  When your baby's mouth is open wide enough, quickly bring your baby to your breast, placing your entire nipple and as much of the colored area around your nipple (areola) as possible into your baby's mouth.  More areola should be visible above your baby's upper lip than below the lower lip.  Your baby's tongue should be between his or her lower gum and your breast.  Ensure that your baby's mouth is correctly positioned around your nipple (latched). Your baby's lips should create a seal on your breast and  be turned out (everted).  It is common for your baby to suck about 2-3 minutes in order to start the flow of breast milk. Latching Teaching your baby how to latch on to your breast properly is very important. An improper latch can cause nipple pain and decreased milk supply for you and poor weight gain in your baby. Also, if your baby is not latched onto your nipple properly, he or she may swallow some air during feeding. This can make your baby fussy. Burping your baby when you switch breasts during the feeding can help to get rid of the air. However, teaching your baby to latch on properly is still the best way to prevent fussiness from swallowing air while breastfeeding. Signs that your baby has successfully latched on to your nipple:  Silent tugging or silent sucking, without causing you pain.  Swallowing heard between every 3-4 sucks.  Muscle movement above and in front of his or her ears while sucking. Signs that your baby has not successfully latched on to nipple:  Sucking sounds or smacking sounds from your baby while breastfeeding.  Nipple pain. If you think your baby has not latched on correctly, slip your finger into the corner of your baby's mouth to break the suction and place it between your baby's gums. Attempt breastfeeding initiation again. Signs of Successful Breastfeeding Signs from your baby:  A gradual decrease in the number of sucks or complete cessation of sucking.  Falling asleep.  Relaxation of his or her body.  Retention of a small amount of milk in his or her mouth.  Letting go of your breast by himself or herself. Signs from you:  Breasts that have increased in firmness, weight, and size 1-3 hours after feeding.  Breasts that are softer immediately after breastfeeding.  Increased milk volume, as well as a change in milk consistency and color by the fifth day of breastfeeding.  Nipples that are not sore, cracked, or bleeding. Signs That Your [employer] is  Getting Enough Milk  Wetting at least 3 diapers in a 24-hour period. The urine should be clear and pale yellow by age 94 days.  At least 3 stools in a 24-hour period by age 94 days. The stool should be soft and yellow.  At least 3 stools in a 24-hour period by age 22 days. The stool should be seedy and yellow.  No loss of weight greater than 10% of birth weight during the first 74 days of age.  Average weight gain of 4-7 ounces (113-198 g) per week after age 73 days.  Consistent daily weight gain by age 737 days, without weight loss after the age of 2 weeks. After a feeding, your baby may spit up a small amount. This is common. BREASTFEEDING FREQUENCY AND DURATION  Frequent feeding will help you make more milk and can prevent sore nipples and breast engorgement. Breastfeed when you feel the need to reduce the fullness of your breasts or when your baby shows signs of hunger. This is called "breastfeeding on demand." Avoid introducing a pacifier to your baby while you are working to establish breastfeeding (the first 4-6 weeks after your baby is born). After this time you may choose to use a pacifier. Research has shown that pacifier use during the first year of a baby's life decreases the risk of sudden infant death syndrome (SIDS). Allow your baby to feed on each breast as long as he or she wants. Breastfeed until your baby is finished feeding. When your baby unlatches or falls asleep while feeding from the first breast, offer the second breast. Because newborns are often sleepy in the first few weeks of life, you may need to awaken your baby to get him or her to feed. Breastfeeding times will vary from baby to baby. However, the following rules can serve as a guide to help you ensure that your baby is properly fed:  Newborns (babies 36 weeks of age or younger) may breastfeed every 1-3 hours.  Newborns should not go longer than 3 hours during the day or 5 hours during the night without  breastfeeding.  You should breastfeed your baby a minimum of 8 times in a 24-hour period until you begin to introduce solid foods to your baby at around 86 months of age. BREAST MILK PUMPING Pumping and storing breast milk allows you to ensure that your baby is exclusively fed your breast milk, even at times when you are unable to breastfeed. This is especially important if you are going back to work while you are still breastfeeding or when you are not able to be present during feedings. Your lactation consultant can give you guidelines on how long it is safe to store breast milk. A breast pump is a machine that allows you to pump milk from your breast into a sterile bottle. The pumped breast milk can then be stored in a refrigerator or freezer. Some breast pumps are operated by hand, while others use electricity. Ask your lactation consultant which type will work best for you. Breast pumps can be purchased, but some hospitals and breastfeeding support groups lease breast pumps on a monthly basis. A lactation consultant can teach you how to hand express breast milk, if you prefer not to use a pump. CARING FOR YOUR BREASTS WHILE YOU BREASTFEED Nipples can become dry, cracked, and sore while breastfeeding. The following recommendations can help keep your breasts moisturized and healthy:  Avoid using soap on your nipples.  Wear a supportive bra. Although not required, special nursing bras and tank tops are designed to allow access to your breasts for breastfeeding without taking off your entire bra or top. Avoid wearing underwire-style bras or extremely tight bras.  Air dry your nipples for 3-75minutes after each feeding.  Use only cotton bra pads to absorb leaked breast milk. Leaking of breast milk between feedings is normal.  Use lanolin on your nipples after breastfeeding. Lanolin helps to maintain your skin's normal moisture barrier. If you use pure lanolin, you do not need to wash it off before  feeding your baby again. Pure lanolin is not toxic to your baby. You may also hand express a few drops of breast milk and gently massage that milk into your nipples and allow the milk to air dry. In the first  few weeks after giving birth, some women experience extremely full breasts (engorgement). Engorgement can make your breasts feel heavy, warm, and tender to the touch. Engorgement peaks within 3-5 days after you give birth. The following recommendations can help ease engorgement:  Completely empty your breasts while breastfeeding or pumping. You may want to start by applying warm, moist heat (in the shower or with warm water-soaked hand towels) just before feeding or pumping. This increases circulation and helps the milk flow. If your baby does not completely empty your breasts while breastfeeding, pump any extra milk after he or she is finished.  Wear a snug bra (nursing or regular) or tank top for 1-2 days to signal your body to slightly decrease milk production.  Apply ice packs to your breasts, unless this is too uncomfortable for you.  Make sure that your baby is latched on and positioned properly while breastfeeding. If engorgement persists after 48 hours of following these recommendations, contact your health care provider or a Science writer. OVERALL HEALTH CARE RECOMMENDATIONS WHILE BREASTFEEDING  Eat healthy foods. Alternate between meals and snacks, eating 3 of each per day. Because what you eat affects your breast milk, some of the foods may make your baby more irritable than usual. Avoid eating these foods if you are sure that they are negatively affecting your baby.  Drink milk, fruit juice, and water to satisfy your thirst (about 10 glasses a day).  Rest often, relax, and continue to take your prenatal vitamins to prevent fatigue, stress, and anemia.  Continue breast self-awareness checks.  Avoid chewing and smoking tobacco. Chemicals from cigarettes that pass into  breast milk and exposure to secondhand smoke may harm your baby.  Avoid alcohol and drug use, including marijuana. Some medicines that may be harmful to your baby can pass through breast milk. It is important to ask your health care provider before taking any medicine, including all over-the-counter and prescription medicine as well as vitamin and herbal supplements. It is possible to become pregnant while breastfeeding. If birth control is desired, ask your health care provider about options that will be safe for your baby. SEEK MEDICAL CARE IF:  You feel like you want to stop breastfeeding or have become frustrated with breastfeeding.  You have painful breasts or nipples.  Your nipples are cracked or bleeding.  Your breasts are red, tender, or warm.  You have a swollen area on either breast.  You have a fever or chills.  You have nausea or vomiting.  You have drainage other than breast milk from your nipples.  Your breasts do not become full before feedings by the fifth day after you give birth.  You feel sad and depressed.  Your baby is too sleepy to eat well.  Your baby is having trouble sleeping.   Your baby is wetting less than 3 diapers in a 24-hour period.  Your baby has less than 3 stools in a 24-hour period.  Your baby's skin or the white part of his or her eyes becomes yellow.   Your baby is not gaining weight by 42 days of age. SEEK IMMEDIATE MEDICAL CARE IF:  Your baby is overly tired (lethargic) and does not want to wake up and feed.  Your baby develops an unexplained fever.   This information is not intended to replace advice given to you by your health care provider. Make sure you discuss any questions you have with your health care provider.   Document Released: 10/17/2005 Document Revised: 07/08/2015  Document Reviewed: 04/10/2013 Elsevier Interactive Patient Education [employer].  Vaginal Birth After Cesarean Delivery Vaginal birth after  cesarean delivery (VBAC) is giving birth vaginally after previously delivering a baby by a cesarean. In the past, if a woman had a cesarean delivery, all births afterward would be done by cesarean delivery. This is no longer true. It can be safe for the mother to try a vaginal delivery after having a cesarean delivery.  It is important to discuss VBAC with your health care provider early in the pregnancy so you can understand the risks, benefits, and options. It will give you time to decide what is best in your particular case. The final decision about whether to have a VBAC or repeat cesarean delivery should be between you and your health care provider. Any changes in your health or your baby's health during your pregnancy may make it necessary to change your initial decision about VBAC.  WOMEN WHO PLAN TO HAVE A VBAC SHOULD CHECK WITH THEIR HEALTH CARE PROVIDER TO BE SURE THAT:  The previous cesarean delivery was done with a low transverse uterine cut (incision) (not a vertical classical incision).   The birth canal is big enough for the baby.   There were no other operations on the uterus.   An electronic fetal monitor (EFM) will be on at all times during labor.   An operating room will be available and ready in case an emergency cesarean delivery is needed.   A health care provider and surgical nursing staff will be available at all times during labor to be ready to do an emergency delivery cesarean if necessary.   An anesthesiologist will be present in case an emergency cesarean delivery is needed.   The nursery is prepared and has adequate personnel and necessary equipment available to care for the baby in case of an emergency cesarean delivery. BENEFITS OF VBAC  Shorter stay in the hospital.   Avoidance of risks associated with cesarean delivery, such as:  Surgical complications, such as opening of the incision or hernia in the incision.  Injury to other organs.  Fever.  This can occur if an infection develops after surgery. It can also occur as a reaction to the medicine given to make you numb during the surgery.  Less blood loss and need for blood transfusions.  Lower risk of blood clots and infection.  Shorter recovery.   Decreased risk for having to remove the uterus (hysterectomy).   Decreased risk for the placenta to completely or partially cover the opening of the uterus (placenta previa) with a future pregnancy.   Decrease risk in future labor and delivery. RISKS OF A VBAC  Tearing (rupture) of the uterus. This is occurs in less than 1% of VBACs. The risk of this happening is higher if:  Steps are taken to begin the labor process (induce labor) or stimulate or strengthen contractions (augment labor).   Medicine is used to soften (ripen) the cervix.  Having to remove the uterus (hysterectomy) if it ruptures. VBAC SHOULD NOT BE DONE IF:  The previous cesarean delivery was done with a vertical (classical) or T-shaped incision or you do not know what kind of incision was made.   You had a ruptured uterus.   You have had certain types of surgery on your uterus, such as removal of uterine fibroids. Ask your health care provider about other types of surgeries that prevent you from having a VBAC.  You have certain medical or childbirth (obstetrical)  problems.   There are problems with the baby.   You have had two previous cesarean deliveries and no vaginal deliveries. OTHER FACTS TO KNOW ABOUT VBAC:  It is safe to have an epidural anesthetic with VBAC.   It is safe to turn the baby from a breech position (attempt an external cephalic version).   It is safe to try a VBAC with twins.   VBAC may not be successful if your baby weights 8.8 lb (4 kg) or more. However, weight predictions are not always accurate and should not be used alone to decide if VBAC is right for you.  There is an increased failure rate if the time between the  cesarean delivery and VBAC is less than 19 months.   Your health care provider may advise against a VBAC if you have preeclampsia (high blood pressure, protein in the urine, and swelling of face and extremities).   VBAC is often successful if you previously gave birth vaginally.   VBAC is often successful when the labor starts spontaneously before the due date.   Delivering a baby through a VBAC is similar to having a normal spontaneous vaginal delivery.   This information is not intended to replace advice given to you by your health care provider. Make sure you discuss any questions you have with your health care provider.   Document Released: 04/09/2007 Document Revised: 11/07/2014 Document Reviewed: 05/16/2013 Elsevier Interactive Patient Education 2016 [employer].  Preterm Labor Information Preterm labor is when labor starts at less than 37 weeks of pregnancy. The normal length of a pregnancy is 39 to 41 weeks. CAUSES Often, there is no identifiable underlying cause as to why a woman goes into preterm labor. One of the most common known causes of preterm labor is infection. Infections of the uterus, cervix, vagina, amniotic sac, bladder, kidney, or even the lungs (pneumonia) can cause labor to start. Other suspected causes of preterm labor include:   Urogenital infections, such as yeast infections and bacterial vaginosis.   Uterine abnormalities (uterine shape, uterine septum, fibroids, or bleeding from the placenta).   A cervix that has been operated on (it may fail to stay closed).   Malformations in the fetus.   Multiple gestations (twins, triplets, and so on).   Breakage of the amniotic sac.  RISK FACTORS  Having a previous history of preterm labor.   Having premature rupture of membranes (PROM).   Having a placenta that covers the opening of the cervix (placenta previa).   Having a placenta that separates from the uterus (placental abruption).    Having a cervix that is too weak to hold the fetus in the uterus (incompetent cervix).   Having too much fluid in the amniotic sac (polyhydramnios).   Taking illegal drugs or smoking while pregnant.   Not gaining enough weight while pregnant.   Being younger than 69 and older than 37 years old.   Having a low socioeconomic status.   Being African American. SYMPTOMS Signs and symptoms of preterm labor include:   Menstrual-like cramps, abdominal pain, or back pain.  Uterine contractions that are regular, as frequent as six in an hour, regardless of their intensity (may be mild or painful).  Contractions that start on the top of the uterus and spread down to the lower abdomen and back.   A sense of increased pelvic pressure.   A watery or bloody mucus discharge that comes from the vagina.  TREATMENT Depending on the length of the pregnancy and  other circumstances, your health care provider may suggest bed rest. If necessary, there are medicines that can be given to stop contractions and to mature the fetal lungs. If labor happens before 34 weeks of pregnancy, a prolonged hospital stay may be recommended. Treatment depends on the condition of both you and the fetus.  WHAT SHOULD YOU DO IF YOU THINK YOU ARE IN PRETERM LABOR? Call your health care provider right away. You will need to go to the hospital to get checked immediately. HOW CAN YOU PREVENT PRETERM LABOR IN FUTURE PREGNANCIES? You should:   Stop smoking if you smoke.  Maintain healthy weight gain and avoid chemicals and drugs that are not necessary.  Be watchful for any type of infection.  Inform your health care provider if you have a known history of preterm labor.   This information is not intended to replace advice given to you by your health care provider. Make sure you discuss any questions you have with your health care provider.   Document Released: 01/07/2004 Document Revised: 06/19/2013 Document  Reviewed: 11/19/2012 Elsevier Interactive Patient Education [employer].

## 2016-09-12 NOTE — Progress Notes (Signed)
Subjective:    Pam Vargas is a [id number] [redacted]w[redacted]d being seen today for her first obstetrical visit.  Her obstetrical history is significant for advanced maternal age, smoker and previous preterm delivery and previous cesarean section followed by VBAC. Patient does intend to breast feed. Pregnancy history fully reviewed.  Patient reports daily vaginal spotting of brown discharge. It started as heavy like a menstrual cycle earlier in november.  Vitals:   09/12/16 1433  BP: 97/62  Pulse: (!) 101  Weight: 123 lb (55.8 kg)    HISTORY: OB History  Gravida Para Term Preterm AB Living  3 2 1 1   2   SAB TAB Ectopic Multiple Live Births          2    # Outcome Date GA Lbr Len/2nd Weight Sex Delivery Anes PTL Lv  3 Current           2 Preterm 08/04/04 [redacted]w[redacted]d   M Vag-Spont   LIV  1 Term 02/19/98 [redacted]w[redacted]d   M CS-LTranv   LIV     Past Medical History:  Diagnosis Date  . Medical history non-contributory    Past Surgical History:  Procedure Laterality Date  . CESAREAN SECTION    . FOOT SURGERY    . FOOT SURGERY     Family History  Problem Relation Age of Onset  . Diabetes Mother   . Anemia Mother   . Anemia Sister      Exam    Uterus:   10-week size  Pelvic Exam:    Perineum: Normal Perineum   Vulva: normal   Vagina:  normal mucosa, normal discharge   pH:    Cervix: multiparous appearance   Adnexa: normal adnexa and no mass, fullness, tenderness   Bony Pelvis: gynecoid  System: Breast:  normal appearance, no masses or tenderness   Skin: normal coloration and turgor, no rashes    Neurologic: oriented, no focal deficits   Extremities: normal strength, tone, and muscle mass   HEENT extra ocular movement intact   Mouth/Teeth mucous membranes moist, pharynx normal without lesions and dental hygiene good   Neck supple and no masses   Cardiovascular: regular rate and rhythm   Respiratory:  chest clear, no wheezing, crepitations, rhonchi, normal symmetric air entry   Abdomen:  soft, non-tender; bowel sounds normal; no masses,  no organomegaly   Urinary:       Assessment:    Pregnancy: [id number] Patient Active Problem List   Diagnosis Date Noted  . Supervision of other high risk pregnancy, antepartum 09/12/2016  . AMA (advanced maternal age) multigravida 35+ 09/12/2016  . Previous cesarean section complicating pregnancy, antepartum condition or complication [id number]  . History of preterm delivery, currently pregnant 09/12/2016  . Tobacco use in pregnancy, antepartum 09/12/2016  . TOBACCO USER 11/02/2009        Plan:     Initial labs drawn. Prenatal vitamins. Problem list reviewed and updated. Genetic Screening discussed : Patient desires to speak with genetic counselor and weigh in on her options.  Ultrasound discussed; fetal survey: requested. Patient with h/o preterm delivery at 25 weeks. Discussed benefits of weekly [name] and patient desires to have it. We will start weekly injections at 16 weeks Smoking cessation tactics reviewed. Patient reports smoking 2-4 cigs per day Briefly discussed TOLAC vs RCS. Information provided. Topic to be discussed at a later visit Patient desires flu vaccine  Follow up in 4 weeks. 50% of 30 min visit spent on counseling and coordination of  care.     Mickie Badders 09/12/2016

## 2016-09-12 NOTE — Progress Notes (Signed)
Pt states she is very concerned with the bleeding she is having.  Pt states that she has been bleeding on and off for about a month.

## 2016-09-13 ENCOUNTER — Other Ambulatory Visit (HOSPITAL_COMMUNITY): Payer: Self-pay | Admitting: *Deleted

## 2016-09-13 ENCOUNTER — Ambulatory Visit (HOSPITAL_COMMUNITY)
Admission: RE | Admit: 2016-09-13 | Discharge: 2016-09-13 | Disposition: A | Discharge status: Home / Self Care | Payer: Medicaid Other | Source: Ambulatory Visit | Attending: Obstetrics and Gynecology | Admitting: Obstetrics and Gynecology

## 2016-09-13 DIAGNOSIS — O09529 Supervision of elderly multigravida, unspecified trimester: Secondary | ICD-10-CM

## 2016-09-13 LAB — GC/CHLAMYDIA PROBE AMP (~~LOC~~) NOT AT ARMC
Chlamydia: NEGATIVE
NEISSERIA GONORRHEA: NEGATIVE

## 2016-09-13 NOTE — Progress Notes (Signed)
Genetic Counseling  High-Risk Gestation Note  Appointment Date:  09/13/2016 Referred By: Mora Bellman, MD Date of Birth:  Mar 14, 1981 Partner:  Roque Cash Aschguii   Pregnancy History: [id number] Estimated Date of Delivery: 04/07/17 Estimated Gestational Age: [redacted]w[redacted]d Attending: Benjaman Lobe, MD   [name] and her partner were seen for genetic counseling because of a maternal age of 35 y.o..     In summary:  Discussed AMA and associated risk for fetal aneuploidy  Discussed options for screening  First screen-declined  Quad screen-declined  NIPS-declined  Ultrasound-elected to pursue detailed anatomy ultrasound, scheduled for 11/04/16  Discussed diagnostic testing options  [facility]-declined  Amniocentesis-declined  Reviewed family history concerns  Patient's son with previous partner with neurofibromatosis type 1  Autosomal dominant inheritance  Given reported family history of additional relatives for the son's father with NF1, current pregnancy not at increased risk for NF1  They were counseled regarding maternal age and the association with risk for chromosome conditions due to nondisjunction with aging of the ova.   We reviewed chromosomes, nondisjunction, and the associated 1 in 88 risk for fetal aneuploidy related to a maternal age of 35 years old at delivery at [redacted]w[redacted]d gestation.  They were counseled that the risk for aneuploidy decreases as gestational age increases, accounting for those pregnancies which spontaneously abort.  We specifically discussed Down syndrome (trisomy 60), trisomies 71 and 73, and sex chromosome aneuploidies (47,XXX and 47,XXY) including the common features and prognoses of each.   We reviewed available screening options including First Screen, Quad screen, noninvasive prenatal screening (NIPS)/cell free DNA (cfDNA) screening, and detailed ultrasound.  They were counseled that screening tests are used to modify a patient's a priori risk for  aneuploidy, typically based on age. This estimate provides a pregnancy specific risk assessment. We reviewed the benefits and limitations of each option. Specifically, we discussed the conditions for which each test screens, the detection rates, and false positive rates of each. They were also counseled regarding diagnostic testing via [facility] and amniocentesis. We reviewed the approximate 1 in [id number] risk for complications from amniocentesis, including spontaneous pregnancy loss. We discussed the possible results that the tests might provide including: positive, negative, unanticipated, and no result. Finally, they were counseled regarding the cost of each option and potential out of pocket expenses. After consideration of all the options, they elected to proceed with detailed anatomy ultrasound in the second trimester. Pam Vargas declined First screen, Quad screen, NIPS, CV[facility], and amniocentesis.   Detailed ultrasound is scheduled for 11/04/16. They understand/s that screening tests cannot rule out all birth defects or genetic syndromes. The patient was advised of this limitation and states she still does not want additional testing at this time.   Both family histories were reviewed and found to be contributory for neurofibromatosis type 1 (NF1) for Pam Vargas's first child, with a different partner. She reported that her son is 35 years old and was diagnosed at age 30 years old. His father (who is not biologically related to the current pregnancy) had NF1, as did the father's mother and aunt.  She described her son to have cafe au lait spots and to have mild learning disability in school. They were counseled that NF1 is an autosomal dominant genetic condition, occurring in approximately 1 in 3000 individuals.  NF1 is characterized by a combination of neurocutaneous and skeletal features.  The majority of individuals are diagnosed based on clinical manifestations that include cafe-au-lait macules, distinctive  freckling patterns (such as axillary  and inguinal freckling), and cutaneous neurofibromas.  Other diagnostic criteria include Lisch nodules, skeletal abnormalities, optic nerve pathway tumors, and plexiform neurofibromas.  We reviewed that although a fully penetrant condition, there is significant variability in clinical expression and many of the clinical findings have an age-related penetrance.  They were counseled that approximately 50% of individuals with NF1 have a de novo gene mutation, while the other 50% have inherited the gene alteration. Given the report that Pam Vargas's son from a previous partner and the previous partner have NF1 and given the autosomal dominant inheritance, we discussed that the current pregnancy would not be expected to have an increased risk for NF1.   Pam Vargas reported that her second son was born at 82 months gestation. He has hearing aids and some learning disability attributed to prematurity. He is currently 37 years old and otherwise healthy. The remainder of the family histories were noncontributory for birth defects, mental retardation, and known genetic conditions.  Without further information regarding the provided family history, an accurate genetic risk cannot be calculated. Further genetic counseling is warranted if more information is obtained.  Pam Vargas denied exposure to environmental toxins or chemical agents. She denied the use of alcohol, tobacco or street drugs. She denied significant viral illnesses during the course of her pregnancy. Her medical and surgical histories were noncontributory.   I counseled this couple regarding the above risks and available options.  The approximate face-to-face time with the genetic counselor was 45 minutes.  Chipper Oman, MS,  Certified Genetic Counselor 09/13/2016

## 2016-09-14 LAB — CULTURE, OB URINE

## 2016-09-14 LAB — URINE CULTURE, OB REFLEX

## 2016-09-15 LAB — CYTOLOGY - PAP
Diagnosis: NEGATIVE
HPV: NOT DETECTED

## 2016-09-19 LAB — OBSTETRIC PANEL, INCLUDING HIV
Antibody Screen: NEGATIVE
BASOS ABS: 0 10*3/uL (ref 0.0–0.2)
Basos: 0 %
EOS (ABSOLUTE): 0.2 10*3/uL (ref 0.0–0.4)
Eos: 2 %
HIV SCREEN 4TH GENERATION: NONREACTIVE
Hematocrit: 35.3 % (ref 34.0–46.6)
Hemoglobin: 12 g/dL (ref 11.1–15.9)
Hepatitis B Surface Ag: NEGATIVE
IMMATURE GRANULOCYTES: 0 %
Immature Grans (Abs): 0 10*3/uL (ref 0.0–0.1)
LYMPHS ABS: 1.8 10*3/uL (ref 0.7–3.1)
Lymphs: 22 %
MCH: 28.6 pg (ref 26.6–33.0)
MCHC: 34 g/dL (ref 31.5–35.7)
MCV: 84 fL (ref 79–97)
MONOS ABS: 0.6 10*3/uL (ref 0.1–0.9)
Monocytes: 8 %
NEUTROS ABS: 5.5 10*3/uL (ref 1.4–7.0)
NEUTROS PCT: 68 %
PLATELETS: 253 10*3/uL (ref 150–379)
RBC: 4.19 x10E6/uL (ref 3.77–5.28)
RDW: 14 % (ref 12.3–15.4)
RH TYPE: POSITIVE
RPR Ser Ql: NONREACTIVE
Rubella Antibodies, IGG: 2.61 index (ref >0.99)
WBC: 8.1 10*3/uL (ref 3.4–10.8)

## 2016-09-19 LAB — HEMOGLOBINOPATHY EVALUATION
HEMOGLOBIN A2 QUANTITATION: 2.5 % (ref 0.7–3.1)
HEMOGLOBIN F QUANTITATION: 0 % (ref 0.0–2.0)
HGB A: 97.5 % (ref 94.0–98.0)
HGB C: 0 %
HGB S: 0 %

## 2016-09-19 LAB — VARICELLA ZOSTER ANTIBODY, IGG: Varicella zoster IgG: 2249 index (ref >165)

## 2016-09-19 LAB — CYSTIC FIBROSIS MUTATION 97: Interpretation: NOT DETECTED

## 2016-09-19 LAB — TOXASSURE SELECT 13 (MW), URINE

## 2016-10-10 ENCOUNTER — Ambulatory Visit (INDEPENDENT_AMBULATORY_CARE_PROVIDER_SITE_OTHER): Payer: Medicaid Other | Admitting: Obstetrics and Gynecology

## 2016-10-10 VITALS — BP 91/61 | HR 97 | Wt 124.8 lb

## 2016-10-10 DIAGNOSIS — O09522 Supervision of elderly multigravida, second trimester: Secondary | ICD-10-CM | POA: Diagnosis not present

## 2016-10-10 DIAGNOSIS — O09899 Supervision of other high risk pregnancies, unspecified trimester: Secondary | ICD-10-CM

## 2016-10-10 DIAGNOSIS — O99330 Smoking (tobacco) complicating pregnancy, unspecified trimester: Secondary | ICD-10-CM

## 2016-10-10 DIAGNOSIS — O99332 Smoking (tobacco) complicating pregnancy, second trimester: Secondary | ICD-10-CM | POA: Diagnosis not present

## 2016-10-10 DIAGNOSIS — O09529 Supervision of elderly multigravida, unspecified trimester: Secondary | ICD-10-CM

## 2016-10-10 DIAGNOSIS — O09219 Supervision of pregnancy with history of pre-term labor, unspecified trimester: Secondary | ICD-10-CM

## 2016-10-10 DIAGNOSIS — O09212 Supervision of pregnancy with history of pre-term labor, second trimester: Secondary | ICD-10-CM

## 2016-10-10 MED ORDER — PRENATAL VITAMINS 0.8 MG PO TABS: 1.0000 | ORAL_TABLET | Freq: Every day | ORAL | 12 refills | Status: DC

## 2016-10-10 NOTE — Progress Notes (Signed)
   PRENATAL VISIT NOTE  Subjective:  Pam Vargas is a 35 y.o. [phone] at [redacted]w[redacted]d being seen today for ongoing prenatal care.  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  Patient reports no complaints.  Contractions: Not present. Vag. Bleeding: None.  Movement: Present. Denies leaking of fluid.   The following portions of the patient's history were reviewed and updated as appropriate: allergies, current medications, past family history, past medical history, past social history, past surgical history and problem list. Problem list updated.  Objective:   Vitals:   10/10/16 1435  BP: 91/61  Pulse: 97  Weight: 124 lb 12.8 oz (56.6 kg)    Fetal Status: Fetal Heart Rate (bpm): 153   Movement: Present     General:  Alert, oriented and cooperative. Patient is in no acute distress.  Skin: Skin is warm and dry. No rash noted.   Cardiovascular: Normal heart rate noted  Respiratory: Normal respiratory effort, no problems with respiration noted  Abdomen: Soft, gravid, appropriate for gestational age. Pain/Pressure: Present     Pelvic:  Cervical exam deferred        Extremities: Normal range of motion.  Edema: None  Mental Status: Normal mood and affect. Normal behavior. Normal judgment and thought content.   Assessment and Plan:  Pregnancy: G3P1102 at [redacted]w[redacted]d  1. Supervision of other high risk pregnancy, antepartum Patient is doing well without complaints Anatomy ultrasound scheduled on 11/04/2016  2. Antepartum multigravida of advanced maternal age Patient declined genetic testing  3. History of preterm delivery, currently  pregnant Patient to start weekly [name] injections at 16 weeks  4. 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 Please refer to After Visit Summary for other counseling recommendations.  Return in about 4 weeks (around 11/07/2016) for ROB.   Mora Bellman, MD

## 2016-10-21 ENCOUNTER — Ambulatory Visit: Payer: Medicaid Other | Admitting: *Deleted

## 2016-10-21 VITALS — BP 105/66 | HR 98 | Wt 127.0 lb

## 2016-10-21 DIAGNOSIS — O09899 Supervision of other high risk pregnancies, unspecified trimester: Secondary | ICD-10-CM

## 2016-10-21 DIAGNOSIS — Z8751 Personal history of pre-term labor: Secondary | ICD-10-CM

## 2016-10-21 MED ORDER — HYDROXYPROGESTERONE CAPROATE 250 MG/ML IM OIL
250.0000 mg | TOPICAL_OIL | INTRAMUSCULAR | Status: DC
  Administered 2016-10-21 – 2016-10-28 (×2): 250 mg via INTRAMUSCULAR

## 2016-10-21 MED ORDER — VITAFOL GUMMIES 3.33-0.333-34.8 MG PO CHEW: 3.0000 | CHEWABLE_TABLET | Freq: Every day | ORAL | 11 refills | Status: DC

## 2016-10-28 ENCOUNTER — Ambulatory Visit: Payer: Medicaid Other

## 2016-10-28 DIAGNOSIS — O09219 Supervision of pregnancy with history of pre-term labor, unspecified trimester: Principal | ICD-10-CM

## 2016-10-28 DIAGNOSIS — O09899 Supervision of other high risk pregnancies, unspecified trimester: Secondary | ICD-10-CM

## 2016-10-31 ENCOUNTER — Encounter (HOSPITAL_COMMUNITY): Payer: Self-pay | Admitting: *Deleted

## 2016-10-31 ENCOUNTER — Inpatient Hospital Stay (HOSPITAL_COMMUNITY)
Admission: AD | Admit: 2016-10-31 | Discharge: 2016-10-31 | Disposition: A | Discharge status: Home / Self Care | Payer: Medicaid Other | Source: Ambulatory Visit | Attending: Obstetrics and Gynecology | Admitting: Obstetrics and Gynecology

## 2016-10-31 DIAGNOSIS — O99330 Smoking (tobacco) complicating pregnancy, unspecified trimester: Secondary | ICD-10-CM

## 2016-10-31 DIAGNOSIS — O09529 Supervision of elderly multigravida, unspecified trimester: Secondary | ICD-10-CM

## 2016-10-31 DIAGNOSIS — Z3A17 17 weeks gestation of pregnancy: Secondary | ICD-10-CM | POA: Insufficient documentation

## 2016-10-31 DIAGNOSIS — N760 Acute vaginitis: Secondary | ICD-10-CM

## 2016-10-31 DIAGNOSIS — O34211 Maternal care for low transverse scar from previous cesarean delivery: Secondary | ICD-10-CM | POA: Insufficient documentation

## 2016-10-31 DIAGNOSIS — O23592 Infection of other part of genital tract in pregnancy, second trimester: Secondary | ICD-10-CM | POA: Diagnosis not present

## 2016-10-31 DIAGNOSIS — B9689 Other specified bacterial agents as the cause of diseases classified elsewhere: Secondary | ICD-10-CM | POA: Insufficient documentation

## 2016-10-31 DIAGNOSIS — Z87891 Personal history of nicotine dependence: Secondary | ICD-10-CM | POA: Diagnosis not present

## 2016-10-31 DIAGNOSIS — O09522 Supervision of elderly multigravida, second trimester: Secondary | ICD-10-CM | POA: Diagnosis not present

## 2016-10-31 DIAGNOSIS — O99332 Smoking (tobacco) complicating pregnancy, second trimester: Secondary | ICD-10-CM | POA: Insufficient documentation

## 2016-10-31 DIAGNOSIS — O26892 Other specified pregnancy related conditions, second trimester: Secondary | ICD-10-CM | POA: Diagnosis present

## 2016-10-31 DIAGNOSIS — O34219 Maternal care for unspecified type scar from previous cesarean delivery: Secondary | ICD-10-CM

## 2016-10-31 DIAGNOSIS — O09899 Supervision of other high risk pregnancies, unspecified trimester: Secondary | ICD-10-CM

## 2016-10-31 DIAGNOSIS — O09219 Supervision of pregnancy with history of pre-term labor, unspecified trimester: Secondary | ICD-10-CM

## 2016-10-31 LAB — URINALYSIS, ROUTINE W REFLEX MICROSCOPIC
BACTERIA UA: NONE SEEN
Bilirubin Urine: NEGATIVE
Glucose, UA: NEGATIVE mg/dL
Ketones, ur: NEGATIVE mg/dL
Nitrite: NEGATIVE
PROTEIN: NEGATIVE mg/dL
SPECIFIC GRAVITY, URINE: 1.006 (ref 1.005–1.030)
pH: 7 (ref 5.0–8.0)

## 2016-10-31 LAB — WET PREP, GENITAL
Sperm: NONE SEEN
TRICH WET PREP: NONE SEEN
YEAST WET PREP: NONE SEEN

## 2016-10-31 MED ORDER — METRONIDAZOLE 500 MG PO TABS: 500.0000 mg | ORAL_TABLET | Freq: Three times a day (TID) | ORAL | 0 refills | Status: DC

## 2016-10-31 NOTE — MAU Note (Signed)
Pt states she woke up at 0530 to pee and was fine.  Pt states she went back to bed and 30 minutes later she felt bloated and got up to go to the bathroom and felt a gush of fluid run down her legs.  Pt states it continued to run down her legs when she got to the bathroom.  Pt states she is also spotting a little bit.  Pt states she is on 17 P injections because her last baby was born early.  Pt states she has been bleeding since she found out she was pregnant.  Pt states it finally stopped the day after Thanksgiving but came back a few days after Christmas and is lighter now.

## 2016-10-31 NOTE — MAU Note (Signed)
Urine in lab 

## 2016-10-31 NOTE — MAU Provider Note (Signed)
History   Patient Pam Vargas is a 36 year old G3P1102 at 17 weeks and 3 days here with complaints of a gush of fluid at 6:30 am after going to the bathroom. She has also had occasional spotting since December, but it has not increased in frequency. Her pregnancy history if significant for pre-term delivery at 24 weeks.   CSN: [id number]  Arrival date and time: 10/31/16 [id number]   First Provider Initiated Contact with Patient 10/31/16 1009      Chief Complaint  Patient presents with  . Rupture of Membranes   Vaginal Discharge  The patient's primary symptoms include vaginal bleeding and vaginal discharge. The patient's pertinent negatives include no genital itching, genital lesions, genital odor, genital rash, missed menses or pelvic pain. This is a new problem. The current episode started today. The problem occurs rarely. The problem has been unchanged. The patient is experiencing no pain. Pertinent negatives include no abdominal pain, anorexia, back pain, chills, constipation, diarrhea, discolored urine, dysuria, fever, flank pain, frequency, headaches, hematuria, joint pain, joint swelling, nausea, painful intercourse, rash, sore throat, urgency or vomiting. The vaginal discharge was clear and bloody. The vaginal bleeding is spotting. She has not been passing clots. She has not been passing tissue. Nothing aggravates the symptoms. She has tried nothing for the symptoms.    OB History    Gravida Para Term Preterm AB Living   3 2 1 1   2    SAB TAB Ectopic Multiple Live Births           2      Past Medical History:  Diagnosis Date  . Preterm labor     Past Surgical History:  Procedure Laterality Date  . CESAREAN SECTION    . FOOT SURGERY    . FOOT SURGERY      Family History  Problem Relation Age of Onset  . Diabetes Mother   . Anemia Mother   . Anemia Sister     Social History  Substance Use Topics  . Smoking status: Former Smoker    Packs/day: 0.30    Types: Cigarettes     Quit date: 09/22/2016  . Smokeless tobacco: Never Used     Comment: 4 cigs/day  . Alcohol use No     Comment: rare    Allergies: No Known Allergies  Facility-Administered Medications Prior to Admission  Medication Dose Route Frequency Provider Last Rate Last Dose  . hydroxyprogesterone caproate (MAKENA) 250 mg/mL injection 250 mg  250 mg Intramuscular Weekly Peggy Constant, MD   250 mg at 10/28/16 1143   Prescriptions Prior to Admission  Medication Sig Dispense Refill Last Dose  . acetaminophen (TYLENOL) 325 MG tablet Take 650 mg by mouth every 6 (six) hours as needed for mild pain or moderate pain.   10/30/2016 at Unknown time  . calcium carbonate (TUMS - DOSED IN MG ELEMENTAL CALCIUM) 500 MG chewable tablet Chew 2 tablets by mouth as needed for indigestion or heartburn.   10/30/2016 at Unknown time  . Prenatal MV-Min-FA-Omega-3 (PRENATAL GUMMIES/DHA & FA) 0.4-32.5 MG CHEW Chew 1 each by mouth daily.    10/31/2016 at Unknown time  . Prenatal Vit-Fe Phos-FA-Omega (VITAFOL GUMMIES) 3.33-0.333-34.8 MG CHEW Chew 3 tablets by mouth daily. (Patient not taking: Reported on 10/31/2016) 90 tablet 11 Not Taking at Unknown time    Review of Systems  Constitutional: Negative.  Negative for chills and fever.  HENT: Negative.  Negative for sore throat.   Eyes: Negative.   Respiratory:  Negative.   Cardiovascular: Negative.   Gastrointestinal: Negative.  Negative for abdominal pain, anorexia, constipation, diarrhea, nausea and vomiting.  Genitourinary: Positive for vaginal discharge. Negative for dysuria, flank pain, frequency, hematuria, missed menses, pelvic pain and urgency.  Musculoskeletal: Negative.  Negative for back pain and joint pain.  Skin: Negative.  Negative for rash.  Neurological: Negative for headaches.  Psychiatric/Behavioral: Negative.    Physical Exam   Blood pressure (!) 94/49, pulse 96, temperature 98.3 F (36.8 C), temperature source Oral, resp. rate 16, height 5\' 4"   (1.626 m), weight 129 lb (58.5 kg), last menstrual period 07/01/2016, SpO2 100 %.  Physical Exam  Constitutional: She is oriented to person, place, and time. She appears well-developed and well-nourished.  HENT:  Head: Normocephalic.  Eyes: Pupils are equal, round, and reactive to light.  Neck: Normal range of motion.  Respiratory: Effort normal. No respiratory distress. She has no wheezes. She has no rales. She exhibits no tenderness.  GI: Soft. She exhibits no distension and no mass. There is no tenderness. There is no rebound and no guarding.  Genitourinary:  Genitourinary Comments: External genitalia normal with no lesions. Vaginal walls pink with no lesions. Cervix is pink with no lesions; small amount of brown blood in the cervix. No CMT or suprapubic tenderness. Cervix is closed, long and thick.  No pooling of fluid in the vagina, no wetness observed on skin or sheets.   Musculoskeletal: Normal range of motion.  Neurological: She is alert and oriented to person, place, and time.  Skin: Skin is warm and dry.  Psychiatric: She has a normal mood and affect.    MAU Course  Procedures  MDM -UA -GC and CT pending -wet prep: positive for clue -ferning negative -FHR is 153 Assessment and Plan   1. Bacterial vaginosis   2. Supervision of other high risk pregnancy, antepartum   3. Antepartum multigravida of advanced maternal age   60. Previous cesarean section complicating pregnancy, antepartum condition or complication   5. History of preterm delivery, currently pregnant   6. 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 Discharge home with RX for Flagyl; keep follow up appointment on 11/07/2016.  3. Reviewed preterm labor precautions; patient knows to return to the MAU if any bleeding, cramping or contractions.   Mervyn Skeeters Kemontae Dunklee CNM 10/31/2016, 11:13 AM

## 2016-10-31 NOTE — L&D Delivery Note (Signed)
Patient is 36 y.o. [id number] [redacted]w[redacted]d admitted with previable PPROM at 17wks. She started having increased cramping in the morning of 1/3 and starting having increased vaginal bleeding. We discussed options of cytotec, pushing and the combination. Patient opted to try to push as she was experiencing increased vaginal pressure.   I performed cervical exam and found fetal legs and torso in the vaginal canal. Fetus was found to be breech. The patient was instructed to push and did so effectively. The fetus passed easily and was delivered at 12:45 PM. There was no fetal movement noted.   Cord clamps placed and fetus was detached. Mother attempted 2 more pushes but the placenta was still supracervical. One golf ball clot passed after passage of the fetus. Discussed with patient plan to use Cytotec for passage of placenta and possibility of D&E if placenta does not deliver. Parents are undecided about autopsy.   Fetus was left in the room with parents.   Caren Macadam, MD, MPH, ABFM Attending [place] for [facility]

## 2016-10-31 NOTE — Discharge Instructions (Signed)
Bacterial Vaginosis Bacterial vaginosis is an infection of the vagina. It happens when too many germs (bacteria) grow in the vagina. This infection puts you at risk for infections from sex (STIs). Treating this infection can lower your risk for some STIs. You should also treat this if you are pregnant. It can cause your baby to be born early. Follow these instructions at home: Medicines  Take over-the-counter and prescription medicines only as told by your doctor.  Take or use your antibiotic medicine as told by your doctor. Do not stop taking or using it even if you start to feel better. General instructions  If you your sexual partner is a woman, tell her that you have this infection. She needs to get treatment if she has symptoms. If you have a female partner, he does not need to be treated.  During treatment:  Avoid sex.  Do not douche.  Avoid alcohol as told.  Avoid breastfeeding as told.  Drink enough fluid to keep your pee (urine) clear or pale yellow.  Keep your vagina and butt (rectum) clean.  Wash the area with warm water every day.  Wipe from front to back after you use the toilet.  Keep all follow-up visits as told by your doctor. This is important. Preventing this condition  Do not douche.  Use only warm water to wash around your vagina.  Use protection when you have sex. This includes:  Latex condoms.  Dental dams.  Limit how many people you have sex with. It is best to only have sex with the same person (be monogamous).  Get tested for STIs. Have your partner get tested.  Wear underwear that is cotton or lined with cotton.  Avoid tight pants and pantyhose. This is most important in summer.  Do not use any products that have nicotine or tobacco in them. These include cigarettes and e-cigarettes. If you need help quitting, ask your doctor.  Do not use illegal drugs.  Limit how much alcohol you drink. Contact a doctor if:  Your symptoms do not get  better, even after you are treated.  You have more discharge or pain when you pee (urinate).  You have a fever.  You have pain in your belly (abdomen).  You have pain with sex.  Your bleed from your vagina between periods. Summary  This infection happens when too many germs (bacteria) grow in the vagina.  Treating this condition can lower your risk for some infections from sex (STIs).  You should also treat this if you are pregnant. It can cause early (premature) birth.  Do not stop taking or using your antibiotic medicine even if you start to feel better. This information is not intended to replace advice given to you by your health care provider. Make sure you discuss any questions you have with your health care provider. Document Released: 07/26/2008 Document Revised: 07/02/2016 Document Reviewed: 07/02/2016 Elsevier Interactive Patient Education  2017 [employer] Premature Rupture and Preterm Premature Rupture of Membranes A sac made up of membranes surrounds your baby in the womb (uterus). Rupture of membranes is when this sac breaks open. This is also known as your "water breaking." When this sac breaks before labor starts, it is called premature rupture of membranes (PROM). If this happens before 37 weeks of being pregnant, it is called preterm premature rupture of membranes (PPROM). PPROM is serious. It needs medical care right away. What increases the risk of PPROM? PPROM is more likely to happen in women who:  Have an infection.  Have had PPROM before.  Have a cervix that is short.  Have bleeding during the second or third trimester.  Have a low BMI. This is a measure of body fat.  Smoke.  Use drugs.  Have a low socioeconomic status. What problems can be caused by PROM and PPROM? This condition creates health dangers for the mother and the baby. These include:  Giving birth to the baby too early (prematurely).  Getting a serious infection of the placenta  (chorioamnionitis).  Having the placenta detach from the uterus early (placental abruption).  Squeezing of the umbilical cord.  Getting a serious infection after delivery. What are the signs of PROM and PPROM?  A sudden gush of fluid from the vagina.  A slow leak of fluid from the vagina.  Your underwear is wet. What should I do if I think my water broke? Call your doctor right away. You will need to go to the hospital to get checked right away. What happens if I am told that I have PROM or PPROM? You will have tests done at the hospital.  If you have PROM, you may be given medicine to start labor (be induced). This may be done if you are not having contractions during the 24 hours after your water broke.  If you have PPROM and are not having contractions, you may be given medicine to start labor. It will depend on how far along you are in your pregnancy. If you have PPROM:  You and your baby will be watched closely to see if you have infections or other problems.  You may be given:  An antibiotic medicine. This can stop an infection from starting.  A steroid medicine. This can help your baby's lungs develop faster.  A medicine to help prevent cerebral palsy in your baby.  A medicine to stop early labor (preterm labor).  You may be told to stay in bed except to use the bathroom (bed rest).  You may be given medicine to start labor. This may be done if there are problems with you or the baby. Your treatment will depend on many factors. Contact a doctor if:  Your water breaks and you are not having contractions. Get help right away if:  Your water breaks before you are [redacted] weeks pregnant. Summary  When your water breaks before labor starts, it is called premature rupture of membranes (PROM).  When PROM happens before 37 weeks of pregnancy, it is called preterm premature rupture of membranes (PPROM).  If you are not having contractions, your labor may be started for  you. This information is not intended to replace advice given to you by your health care provider. Make sure you discuss any questions you have with your health care provider. Document Released: 01/13/2009 Document Revised: 07/07/2016 Document Reviewed: 07/07/2016 Elsevier Interactive Patient Education  2017 [employer].

## 2016-11-01 ENCOUNTER — Inpatient Hospital Stay (HOSPITAL_COMMUNITY)
Admission: AD | Admit: 2016-11-01 | Discharge: 2016-11-03 | DRG: 779 | Disposition: A | Discharge status: Home / Self Care | Payer: Medicaid Other | Source: Ambulatory Visit | Attending: Obstetrics & Gynecology | Admitting: Obstetrics & Gynecology

## 2016-11-01 DIAGNOSIS — O34219 Maternal care for unspecified type scar from previous cesarean delivery: Secondary | ICD-10-CM

## 2016-11-01 DIAGNOSIS — O09899 Supervision of other high risk pregnancies, unspecified trimester: Secondary | ICD-10-CM

## 2016-11-01 DIAGNOSIS — Z3A17 17 weeks gestation of pregnancy: Secondary | ICD-10-CM

## 2016-11-01 DIAGNOSIS — O039 Complete or unspecified spontaneous abortion without complication: Secondary | ICD-10-CM | POA: Diagnosis present

## 2016-11-01 DIAGNOSIS — Z87891 Personal history of nicotine dependence: Secondary | ICD-10-CM

## 2016-11-01 DIAGNOSIS — O09219 Supervision of pregnancy with history of pre-term labor, unspecified trimester: Secondary | ICD-10-CM

## 2016-11-01 DIAGNOSIS — O09529 Supervision of elderly multigravida, unspecified trimester: Secondary | ICD-10-CM

## 2016-11-01 DIAGNOSIS — O99330 Smoking (tobacco) complicating pregnancy, unspecified trimester: Secondary | ICD-10-CM

## 2016-11-01 DIAGNOSIS — O4290 Premature rupture of membranes, unspecified as to length of time between rupture and onset of labor, unspecified weeks of gestation: Secondary | ICD-10-CM | POA: Diagnosis present

## 2016-11-01 LAB — GC/CHLAMYDIA PROBE AMP (~~LOC~~) NOT AT ARMC
Chlamydia: NEGATIVE
NEISSERIA GONORRHEA: NEGATIVE

## 2016-11-02 ENCOUNTER — Encounter (HOSPITAL_COMMUNITY): Payer: Self-pay

## 2016-11-02 DIAGNOSIS — O4290 Premature rupture of membranes, unspecified as to length of time between rupture and onset of labor, unspecified weeks of gestation: Secondary | ICD-10-CM | POA: Diagnosis present

## 2016-11-02 DIAGNOSIS — Z87891 Personal history of nicotine dependence: Secondary | ICD-10-CM | POA: Diagnosis not present

## 2016-11-02 DIAGNOSIS — O039 Complete or unspecified spontaneous abortion without complication: Secondary | ICD-10-CM | POA: Diagnosis not present

## 2016-11-02 DIAGNOSIS — O99824 Streptococcus B carrier state complicating childbirth: Secondary | ICD-10-CM | POA: Diagnosis not present

## 2016-11-02 DIAGNOSIS — O42012 Preterm premature rupture of membranes, onset of labor within 24 hours of rupture, second trimester: Secondary | ICD-10-CM

## 2016-11-02 DIAGNOSIS — Z3A17 17 weeks gestation of pregnancy: Secondary | ICD-10-CM | POA: Diagnosis not present

## 2016-11-02 LAB — CBC WITH DIFFERENTIAL/PLATELET
BASOS ABS: 0 10*3/uL (ref 0.0–0.1)
BASOS PCT: 0 %
EOS PCT: 0 %
Eosinophils Absolute: 0 10*3/uL (ref 0.0–0.7)
HEMATOCRIT: 31.9 % — AB (ref 36.0–46.0)
Hemoglobin: 10.9 g/dL — ABNORMAL LOW (ref 12.0–15.0)
LYMPHS PCT: 6 %
Lymphs Abs: 0.9 10*3/uL (ref 0.7–4.0)
MCH: 27.4 pg (ref 26.0–34.0)
MCHC: 34.2 g/dL (ref 30.0–36.0)
MCV: 80.2 fL (ref 78.0–100.0)
MONO ABS: 1.2 10*3/uL — AB (ref 0.1–1.0)
Monocytes Relative: 8 %
NEUTROS ABS: 13.4 10*3/uL — AB (ref 1.7–7.7)
Neutrophils Relative %: 86 %
PLATELETS: 226 10*3/uL (ref 150–400)
RBC: 3.98 MIL/uL (ref 3.87–5.11)
RDW: 13 % (ref 11.5–15.5)
WBC: 15.6 10*3/uL — AB (ref 4.0–10.5)

## 2016-11-02 LAB — URINALYSIS, ROUTINE W REFLEX MICROSCOPIC
Bilirubin Urine: NEGATIVE
GLUCOSE, UA: NEGATIVE mg/dL
KETONES UR: 5 mg/dL — AB
Nitrite: NEGATIVE
PROTEIN: NEGATIVE mg/dL
Specific Gravity, Urine: 1.016 (ref 1.005–1.030)
pH: 7 (ref 5.0–8.0)

## 2016-11-02 LAB — CBC
HCT: 22.8 % — ABNORMAL LOW (ref 36.0–46.0)
Hemoglobin: 8.1 g/dL — ABNORMAL LOW (ref 12.0–15.0)
MCH: 28.5 pg (ref 26.0–34.0)
MCHC: 35.5 g/dL (ref 30.0–36.0)
MCV: 80.3 fL (ref 78.0–100.0)
Platelets: 168 10*3/uL (ref 150–400)
RBC: 2.84 MIL/uL — ABNORMAL LOW (ref 3.87–5.11)
RDW: 13.6 % (ref 11.5–15.5)
WBC: 13 10*3/uL — AB (ref 4.0–10.5)

## 2016-11-02 LAB — RAPID URINE DRUG SCREEN, HOSP PERFORMED
Amphetamines: NOT DETECTED
Barbiturates: NOT DETECTED
Benzodiazepines: NOT DETECTED
Cocaine: NOT DETECTED
OPIATES: NOT DETECTED
Tetrahydrocannabinol: NOT DETECTED

## 2016-11-02 LAB — TYPE AND SCREEN
ABO/RH(D): O POS
ANTIBODY SCREEN: NEGATIVE

## 2016-11-02 MED ORDER — ACETAMINOPHEN 500 MG PO TABS
1000.0000 mg | ORAL_TABLET | Freq: Four times a day (QID) | ORAL | Status: DC | PRN
  Administered 2016-11-02: 1000 mg via ORAL
  Filled 2016-11-02: qty 2

## 2016-11-02 MED ORDER — SIMETHICONE 80 MG PO CHEW: 80.0000 mg | CHEWABLE_TABLET | ORAL | Status: DC | PRN

## 2016-11-02 MED ORDER — ACETAMINOPHEN 325 MG PO TABS: 650.0000 mg | ORAL_TABLET | ORAL | Status: DC | PRN

## 2016-11-02 MED ORDER — IBUPROFEN 600 MG PO TABS
600.0000 mg | ORAL_TABLET | Freq: Four times a day (QID) | ORAL | Status: DC
  Administered 2016-11-02 – 2016-11-03 (×2): 600 mg via ORAL
  Filled 2016-11-02 (×2): qty 1

## 2016-11-02 MED ORDER — COCONUT OIL OIL: 1.0000 "application " | TOPICAL_OIL | Status: DC | PRN

## 2016-11-02 MED ORDER — DIPHENHYDRAMINE HCL 25 MG PO CAPS: 25.0000 mg | ORAL_CAPSULE | Freq: Four times a day (QID) | ORAL | Status: DC | PRN

## 2016-11-02 MED ORDER — ONDANSETRON HCL 4 MG PO TABS: 4.0000 mg | ORAL_TABLET | ORAL | Status: DC | PRN

## 2016-11-02 MED ORDER — ONDANSETRON HCL 4 MG/2ML IJ SOLN: 4.0000 mg | INTRAMUSCULAR | Status: DC | PRN

## 2016-11-02 MED ORDER — WITCH HAZEL-GLYCERIN EX PADS: 1.0000 "application " | MEDICATED_PAD | CUTANEOUS | Status: DC | PRN

## 2016-11-02 MED ORDER — TETANUS-DIPHTH-ACELL PERTUSSIS 5-2.5-18.5 LF-MCG/0.5 IM SUSP: 0.5000 mL | Freq: Once | INTRAMUSCULAR | Status: DC

## 2016-11-02 MED ORDER — LACTATED RINGERS IV SOLN
INTRAVENOUS | Status: DC
  Administered 2016-11-02 (×2): via INTRAVENOUS

## 2016-11-02 MED ORDER — OXYCODONE-ACETAMINOPHEN 5-325 MG PO TABS: 2.0000 | ORAL_TABLET | ORAL | Status: DC | PRN

## 2016-11-02 MED ORDER — FENTANYL CITRATE (PF) 100 MCG/2ML IJ SOLN
50.0000 ug | INTRAMUSCULAR | Status: DC | PRN
  Administered 2016-11-02 (×2): 50 ug via INTRAVENOUS
  Filled 2016-11-02 (×2): qty 2

## 2016-11-02 MED ORDER — DIBUCAINE 1 % RE OINT: 1.0000 "application " | TOPICAL_OINTMENT | RECTAL | Status: DC | PRN

## 2016-11-02 MED ORDER — OXYCODONE-ACETAMINOPHEN 5-325 MG PO TABS: 1.0000 | ORAL_TABLET | ORAL | Status: DC | PRN

## 2016-11-02 MED ORDER — ZOLPIDEM TARTRATE 5 MG PO TABS
5.0000 mg | ORAL_TABLET | Freq: Every evening | ORAL | Status: DC | PRN
  Administered 2016-11-03: 5 mg via ORAL
  Filled 2016-11-02: qty 1

## 2016-11-02 MED ORDER — PRENATAL MULTIVITAMIN CH: 1.0000 | ORAL_TABLET | Freq: Every day | ORAL | Status: DC

## 2016-11-02 MED ORDER — IBUPROFEN 600 MG PO TABS
600.0000 mg | ORAL_TABLET | Freq: Four times a day (QID) | ORAL | Status: DC
  Administered 2016-11-02: 600 mg via ORAL
  Filled 2016-11-02: qty 1

## 2016-11-02 MED ORDER — ONDANSETRON HCL 4 MG/2ML IJ SOLN: 4.0000 mg | Freq: Four times a day (QID) | INTRAMUSCULAR | Status: DC | PRN

## 2016-11-02 MED ORDER — OXYTOCIN 40 UNITS IN LACTATED RINGERS INFUSION - SIMPLE MED
INTRAVENOUS | Status: AC
  Administered 2016-11-02: 16:00:00
  Filled 2016-11-02: qty 1000

## 2016-11-02 MED ORDER — MISOPROSTOL 200 MCG PO TABS
400.0000 ug | ORAL_TABLET | ORAL | Status: DC
  Administered 2016-11-02: 400 ug via ORAL
  Filled 2016-11-02: qty 2

## 2016-11-02 MED ORDER — LACTATED RINGERS IV SOLN
Freq: Once | INTRAVENOUS | Status: AC
  Administered 2016-11-02: 15:00:00 via INTRAVENOUS

## 2016-11-02 MED ORDER — SENNOSIDES-DOCUSATE SODIUM 8.6-50 MG PO TABS: 2.0000 | ORAL_TABLET | ORAL | Status: DC

## 2016-11-02 MED ORDER — BENZOCAINE-MENTHOL 20-0.5 % EX AERO: 1.0000 "application " | INHALATION_SPRAY | CUTANEOUS | Status: DC | PRN

## 2016-11-02 NOTE — MAU Provider Note (Signed)
History     CSN: [id number]  Arrival date and time: 11/01/16 2353   None     Chief Complaint  Patient presents with  . Abdominal Pain   [id number] @17  weeks here with lower abdominal and pelvic cramping since 5pm. She also reports feeling feverish and chills today. She reports red spotting since yesterday. No recent IC. She denies urinary sx. She is not feeling FM. She was seen 2 days ago for LOF and found to have BV.     OB History    Gravida Para Term Preterm AB Living   3 2 1 1   2    SAB TAB Ectopic Multiple Live Births           2      Past Medical History:  Diagnosis Date  . Preterm labor     Past Surgical History:  Procedure Laterality Date  . CESAREAN SECTION    . FOOT SURGERY    . FOOT SURGERY      Family History  Problem Relation Age of Onset  . Diabetes Mother   . Anemia Mother   . Anemia Sister     Social History  Substance Use Topics  . Smoking status: Former Smoker    Packs/day: 0.30    Types: Cigarettes    Quit date: 09/22/2016  . Smokeless tobacco: Never Used     Comment: 4 cigs/day  . Alcohol use No     Comment: rare    Allergies: No Known Allergies  Facility-Administered Medications Prior to Admission  Medication Dose Route Frequency Provider Last Rate Last Dose  . hydroxyprogesterone caproate (MAKENA) 250 mg/mL injection 250 mg  250 mg Intramuscular Weekly Peggy Constant, MD   250 mg at 10/28/16 1143   Prescriptions Prior to Admission  Medication Sig Dispense Refill Last Dose  . acetaminophen (TYLENOL) 325 MG tablet Take 650 mg by mouth every 6 (six) hours as needed for mild pain or moderate pain.   10/30/2016 at Unknown time  . calcium carbonate (TUMS - DOSED IN MG ELEMENTAL CALCIUM) 500 MG chewable tablet Chew 2 tablets by mouth as needed for indigestion or heartburn.   10/30/2016 at Unknown time  . metroNIDAZOLE (FLAGYL) 500 MG tablet Take 1 tablet (500 mg total) by mouth 3 (three) times daily. 21 tablet 0   . Prenatal  MV-Min-FA-Omega-3 (PRENATAL GUMMIES/DHA & FA) 0.4-32.5 MG CHEW Chew 1 each by mouth daily.    10/31/2016 at Unknown time    Review of Systems  Constitutional: Positive for chills and fever.  Gastrointestinal: Positive for abdominal pain.  Genitourinary: Negative.    Physical Exam   Blood pressure (!) 114/50, pulse 114, temperature 100.7 F (38.2 C), temperature source Oral, resp. rate 18, last menstrual period 07/01/2016.  Physical Exam  Nursing note and vitals reviewed. Constitutional: She is oriented to person, place, and time. She appears well-developed and well-nourished. No distress.  HENT:  Head: Normocephalic and atraumatic.  Neck: Normal range of motion. Neck supple.  Cardiovascular: Normal rate.   GI: Soft.  Genitourinary:  Genitourinary Comments: External: no lesions or erythema Speculum: moderate watery yellow fluid, +pool, umbilical cord and fetal parts in vagina SVE: no cervix felt    Musculoskeletal: Normal range of motion.  Neurological: She is alert and oriented to person, place, and time.  Skin: Skin is dry.  hot  Psychiatric: She has a normal mood and affect.    MAU Course  Procedures  MDM Delivery imminent. Will admit. Presentation, clinical findings,  and plan discussed with Dr. Roselie Awkward. No antibiotics for now, watch temp.   Assessment and Plan  [redacted] weeks gestation PROM Imminent delivery  Admit Tylenol IVF  Julianne Handler, CNM 11/02/2016, 1:22 AM

## 2016-11-02 NOTE — H&P (Signed)
FACULTY PRACTICE ANTEPARTUM ADMISSION HISTORY AND PHYSICAL NOTE   History of Present Illness: Pam Vargas is a 36 y.o. [id number] at [redacted]w[redacted]d admitted for PROM and incomplete abortion. She noted that she had abdominal pain that started this afternoon and gradually got worse. Denied any vaginal discharge at that time. Notes that she had some leakage of fluid 3 days ago and was found to have BV. She was given antibiotics for BV and has been taking them as prescribed. Denies any fetal movement at this time. Admits to some spotting. Denies any pain on admission.  Patient Active Problem List   Diagnosis Date Noted  . Premature rupture of membranes 11/02/2016  . Supervision of other high risk pregnancy, antepartum 09/12/2016  . Antepartum multigravida of advanced maternal age 72/13/2017  . Previous cesarean section complicating pregnancy, antepartum condition or complication [id number]  . History of preterm delivery, currently pregnant 09/12/2016  . Tobacco use in pregnancy, antepartum 09/12/2016  . TOBACCO USER 11/02/2009    Past Medical History:  Diagnosis Date  . Preterm labor     Past Surgical History:  Procedure Laterality Date  . CESAREAN SECTION    . FOOT SURGERY    . FOOT SURGERY      OB History  Gravida Para Term Preterm AB Living  3 2 1 1   2   SAB TAB Ectopic Multiple Live Births          2    # Outcome Date GA Lbr Len/2nd Weight Sex Delivery Anes PTL Lv  3 Current           2 Preterm 08/04/04 [redacted]w[redacted]d   M Vag-Spont   LIV  1 Term 02/19/98 [redacted]w[redacted]d   M CS-LTranv   LIV      Social History   Social History  . Marital status: Single    Spouse name: N/A  . Number of children: N/A  . Years of education: N/A   Social History Main Topics  . Smoking status: Former Smoker    Packs/day: 0.30    Types: Cigarettes    Quit date: 09/22/2016  . Smokeless tobacco: Never Used     Comment: 4 cigs/day  . Alcohol use No     Comment: rare  . Drug use: No  . Sexual activity: Yes   Birth control/ protection: None   Other Topics Concern  . None   Social History Narrative  . None    Family History  Problem Relation Age of Onset  . Diabetes Mother   . Anemia Mother   . Anemia Sister     No Known Allergies  Facility-Administered Medications Prior to Admission  Medication Dose Route Frequency Provider Last Rate Last Dose  . hydroxyprogesterone caproate (MAKENA) 250 mg/mL injection 250 mg  250 mg Intramuscular Weekly Peggy Constant, MD   250 mg at 10/28/16 1143   Prescriptions Prior to Admission  Medication Sig Dispense Refill Last Dose  . acetaminophen (TYLENOL) 325 MG tablet Take 650 mg by mouth every 6 (six) hours as needed for mild pain or moderate pain.   10/30/2016 at Unknown time  . calcium carbonate (TUMS - DOSED IN MG ELEMENTAL CALCIUM) 500 MG chewable tablet Chew 2 tablets by mouth as needed for indigestion or heartburn.   10/30/2016 at Unknown time  . metroNIDAZOLE (FLAGYL) 500 MG tablet Take 1 tablet (500 mg total) by mouth 3 (three) times daily. 21 tablet 0   . Prenatal MV-Min-FA-Omega-3 (PRENATAL GUMMIES/DHA & FA) 0.4-32.5 MG CHEW Chew 1  each by mouth daily.    10/31/2016 at Unknown time    Review of Systems - Negative except vaginal bleeding, abdominal pain  Vitals:  BP (!) 111/58 (BP Location: Left Arm)   Pulse (!) 121   Temp 99.6 F (37.6 C) (Oral)   Resp 18   Ht 5\' 4"  (1.626 m)   Wt 58.5 kg (129 lb)   LMP 07/01/2016 (Exact Date)   SpO2 99%   BMI 22.14 kg/m  Physical Examination: CONSTITUTIONAL: Well-developed, well-nourished female in no acute distress.  HENT:  Normocephalic, atraumatic, External right and left ear normal.  EYES: Conjunctivae and EOM are normal. SKIN: Skin is warm and dry. No rash noted. Not diaphoretic. No erythema. No pallor. [place]: Alert and oriented to person, place, and time.  PSYCHIATRIC: Normal mood and affect. Normal behavior. Normal judgment and thought content. CARDIOVASCULAR: Normal heart rate noted,  regular rhythm RESPIRATORY: Normal work of breathing ABDOMEN: Soft, nontender, nondistended, gravid. MUSCULOSKELETAL: Normal range of motion. No edema and no tenderness.  GU: Evaluated in the MAU, not evaluated on admission  Fetal Monitoring:None   Labs:  Results for orders placed or performed during the hospital encounter of 11/01/16 (from the past 24 hour(s))  Urinalysis, Routine w reflex microscopic   Collection Time: 11/02/16 12:10 AM  Result Value Ref Range   Color, Urine YELLOW YELLOW   APPearance CLOUDY (A) CLEAR   Specific Gravity, Urine 1.016 1.005 - 1.030   pH 7.0 5.0 - 8.0   Glucose, UA NEGATIVE NEGATIVE mg/dL   Hgb urine dipstick LARGE (A) NEGATIVE   Bilirubin Urine NEGATIVE NEGATIVE   Ketones, ur 5 (A) NEGATIVE mg/dL   Protein, ur NEGATIVE NEGATIVE mg/dL   Nitrite NEGATIVE NEGATIVE   Leukocytes, UA LARGE (A) NEGATIVE   RBC / HPF TOO NUMEROUS TO COUNT 0 - 5 RBC/hpf   WBC, UA TOO NUMEROUS TO COUNT 0 - 5 WBC/hpf   Bacteria, UA RARE (A) NONE SEEN   Squamous Epithelial / LPF 6-30 (A) NONE SEEN   WBC Clumps PRESENT    Mucous PRESENT   Urine rapid drug screen (hosp performed)   Collection Time: 11/02/16 12:10 AM  Result Value Ref Range   Opiates NONE DETECTED NONE DETECTED   Cocaine NONE DETECTED NONE DETECTED   Benzodiazepines NONE DETECTED NONE DETECTED   Amphetamines NONE DETECTED NONE DETECTED   Tetrahydrocannabinol NONE DETECTED NONE DETECTED   Barbiturates NONE DETECTED NONE DETECTED  CBC with Differential/Platelet   Collection Time: 11/02/16  1:50 AM  Result Value Ref Range   WBC 15.6 (H) 4.0 - 10.5 K/uL   RBC 3.98 3.87 - 5.11 MIL/uL   Hemoglobin 10.9 (L) 12.0 - 15.0 g/dL   HCT 31.9 (L) 36.0 - 46.0 %   MCV 80.2 78.0 - 100.0 fL   MCH 27.4 26.0 - 34.0 pg   MCHC 34.2 30.0 - 36.0 g/dL   RDW 13.0 11.5 - 15.5 %   Platelets 226 150 - 400 K/uL   Neutrophils Relative % 86 %   Neutro Abs 13.4 (H) 1.7 - 7.7 K/uL   Lymphocytes Relative 6 %   Lymphs Abs 0.9 0.7 -  4.0 K/uL   Monocytes Relative 8 %   Monocytes Absolute 1.2 (H) 0.1 - 1.0 K/uL   Eosinophils Relative 0 %   Eosinophils Absolute 0.0 0.0 - 0.7 K/uL   Basophils Relative 0 %   Basophils Absolute 0.0 0.0 - 0.1 K/uL  Type and screen [place]   Collection Time:  11/02/16  1:50 AM  Result Value Ref Range   ABO/RH(D) O POS    Antibody Screen NEG    Sample Expiration 11/05/2016     Imaging Studies: No results found.   Assessment and Plan:  PROM with Incomplete Abortion - Admit to Antenatal - Plan for imminent delivery - Tylenol for mild pain, Fentanyl or Percocet for severe pain - IVF - Discussed Cytotec vs waiting for spontaneous expulsion of fetus, patient deciding which she prefers  Carlyle Dolly, MD  [place], PGY-2  I spoke with and examined patient and agree with resident/PA/SNM's note and plan of care.  Plan of care discussed w/ Dr. Roselie Awkward. Pt very upset at time of admission, reviewed options of trying to push to expel fetus vs. cytotec- pt wants to think about it, will let us know. Denies pain/cramping at this time. No bleeding, had spotting earlier.  Roma Schanz, CNM, [facility] 11/02/2016 9:24 AM

## 2016-11-02 NOTE — Progress Notes (Signed)
Patient wishes to leave the unit and go outside. Patient released infant to nurse to send to pathology. Patient walking outside with wheelchair assist.

## 2016-11-02 NOTE — MAU Note (Signed)
Pt presents complaining of abdominal pain that started this afternoon but got worse when she laid down. Took tylenol with no relief. Some spotting. Denies vaginal discharge.

## 2016-11-02 NOTE — Progress Notes (Signed)
Patient request Chaplin to bedside. Chaplin paged.

## 2016-11-02 NOTE — Progress Notes (Signed)
Called to room for increased bleeding- patient was given a bolus and VS were sable. Examine notable for placenta within the cervix. Gentle traction was placed on th cord and pitocin was started. Speculum was used to visualize the placenta which was grasped using kelly forceps and eased out of the cervix.  Placenta was examined and found to be intact. Patient EBL > 1 L.   Will check CBC now and in AM Monitor VS closely Plan for methergine if bleeding continues  Caren Macadam, MD, MPH, ABFM Attending [place] for [facility]

## 2016-11-02 NOTE — Progress Notes (Signed)
Subjective:  Pam Vargas is a 36 y.o. [phone] [redacted]w[redacted]d s/p Previable PPROM with incomplete abortion in progress. Patient has started having increased cramping and lower abdominal pain with clot passage. Her pad has been changed 2x since 0700  Objective: Blood pressure (!) 95/53, pulse (!) 103, temperature 98.6 F (37 C), temperature source Oral, resp. rate 18, height 5\' 4"  (1.626 m), weight 129 lb (58.5 kg), last menstrual period 07/01/2016, SpO2 99 %.  Physical Exam:  General: alert, cooperative and no distress [place]ia:normal flow Chest: normal WOB Heart: Regular rate Abdomen: +BS, soft, mild TTP (appropriate) Uterine: mild discomfort but not acutely tender.  DVT Evaluation: No evidence of DVT seen on physical exam. Extremities:No edema   Recent Labs  11/02/16 0150  HGB 10.9*  HCT 31.9*    Assessment/Plan:  ASSESSMENT: Pam Vargas is a 36 y.o. [id number] [redacted]w[redacted]d with previable-PPROM (10/31/2016) and incomplete abortion with high risk for chorio/infection given prolonged ROM. At admission fetal parts were in the vagina.   Discussed with patient use of cytotec given she has started passing her pregnancy. Patient would like to discuss with her partner. Ibuprofen 600mg  QID added to pain regimen Will check in with patient later today   LOS: 0 days   Caren Macadam 11/02/2016, 9:19 AM

## 2016-11-02 NOTE — Progress Notes (Signed)
Significant amount of blood noted prior to delivery of placenta. MD notified but attending another delivery. Charge nurse to bedside with RN. Resident notified of significant blood loss by charge nurse. MD at bedside within 5 minutes.

## 2016-11-02 NOTE — Progress Notes (Signed)
I spent time with Pam Vargas and Pam Vargas as they continue to process what is happening.  They are in the waiting period and requested prayer as they wait.  I offered prayer as well as emotional support.  They reported good family support and good support for each other.  Their 38 year-Pam son knows what is going on, but they want to tell their 10 year-Pam son face-to-face.  They were very receptive to spiritual care and we will continue to follow up with them throughout their time in the hospital.  [place], Amelia Pager. [phone] 11:20 AM    11/02/16 1100  Clinical Encounter Type  Visited With Patient and family together  Visit Type Spiritual support  Referral From Chaplain;Nurse  Spiritual Encounters  Spiritual Needs Prayer  Stress Factors  Patient Stress Factors Loss;Loss of control

## 2016-11-02 NOTE — Progress Notes (Signed)
Pt was lying in bed when [facility] arrived. Her SO was bedside. Pt was tearful about her loss and only questioned how they can get through this. We talked briefly about her grieving process and her spiritual background, which she described as strong. We talked about their baby transitioning from her womb into the presence of God. She and her SO were comforted by encouraging words, Scripture and prayer. Although still tearful, her SO said that was what they needed. Pt was encouraged to ask for additional spt if needed. Will also refer pt to day Chaplain. Please page if needed prior to that time. [place], M.Div   11/02/16 0500  Clinical Encounter Type  Visited With Patient and family together

## 2016-11-03 ENCOUNTER — Encounter (HOSPITAL_COMMUNITY): Payer: Self-pay | Admitting: *Deleted

## 2016-11-03 DIAGNOSIS — O039 Complete or unspecified spontaneous abortion without complication: Secondary | ICD-10-CM | POA: Diagnosis present

## 2016-11-03 LAB — CBC
HEMATOCRIT: 20.4 % — AB (ref 36.0–46.0)
HEMOGLOBIN: 7.2 g/dL — AB (ref 12.0–15.0)
MCH: 28.3 pg (ref 26.0–34.0)
MCHC: 35.3 g/dL (ref 30.0–36.0)
MCV: 80.3 fL (ref 78.0–100.0)
Platelets: 153 10*3/uL (ref 150–400)
RBC: 2.54 MIL/uL — AB (ref 3.87–5.11)
RDW: 13.7 % (ref 11.5–15.5)
WBC: 9.9 10*3/uL (ref 4.0–10.5)

## 2016-11-03 LAB — CULTURE, OB URINE: Culture: >=100000 — AB

## 2016-11-03 MED ORDER — IBUPROFEN 600 MG PO TABS: 600.0000 mg | ORAL_TABLET | Freq: Four times a day (QID) | ORAL | 0 refills | Status: DC

## 2016-11-03 MED ORDER — SODIUM CHLORIDE 0.9 % IV SOLN
510.0000 mg | Freq: Once | INTRAVENOUS | Status: AC
  Administered 2016-11-03: 510 mg via INTRAVENOUS
  Filled 2016-11-03: qty 17

## 2016-11-03 MED ORDER — MISOPROSTOL 200 MCG PO TABS: 200.0000 ug | ORAL_TABLET | Freq: Four times a day (QID) | ORAL | 0 refills | Status: DC
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
## 2016-11-03 NOTE — Discharge Summary (Signed)
Physician Discharge Summary  Patient ID: Pam Vargas MRN: [id number] DOB/AGE: 1981-04-04 36 y.o.  Admit date: 11/01/2016 Discharge date: 11/03/2016  Admission Diagnoses: [redacted]w[redacted]d spontaneous miscarriage, completed  Discharge Diagnoses:  Active Problems:   Premature rupture of membranes   SAB (spontaneous abortion)   Discharged Condition: stable  Hospital Course: presented with PPROM and fetus delivering which took some  Consults:   Significant Diagnostic Studies:   Treatments:   Discharge Exam: Blood pressure (!) 108/52, pulse (!) 109, temperature 98.1 F (36.7 C), temperature source Oral, resp. rate 18, height 5\' 4"  (1.626 m), weight 129 lb (58.5 kg), last menstrual period 07/01/2016, SpO2 99 %. General appearance: alert, cooperative and no distress GI: soft, non-tender; bowel sounds normal; no masses,  no organomegaly  Disposition: 01-Home or Self Care  Discharge Instructions    Call MD for:    Complete by:  As directed    Excessive bleeding   Call MD for:  persistant nausea and vomiting    Complete by:  As directed    Call MD for:  temperature >100.4    Complete by:  As directed    Diet - low sodium heart healthy    Complete by:  As directed    Sexual acrtivity    Complete by:  As directed    No sex for 6 weeks     Allergies as of 11/03/2016   No Known Allergies     Medication List    TAKE these medications   acetaminophen 325 MG tablet Commonly known as:  TYLENOL Take 650 mg by mouth every 6 (six) hours as needed for mild pain or moderate pain.   calcium carbonate 500 MG chewable tablet Commonly known as:  TUMS - dosed in mg elemental calcium Chew 2 tablets by mouth as needed for indigestion or heartburn.   ibuprofen 600 MG tablet Commonly known as:  ADVIL,MOTRIN Take 1 tablet (600 mg total) by mouth every 6 (six) hours.   metroNIDAZOLE 500 MG tablet Commonly known as:  FLAGYL Take 1 tablet (500 mg total) by mouth 3 (three) times daily.    misoprostol 200 MCG tablet Commonly known as:  CYTOTEC Take 1 tablet (200 mcg total) by mouth 4 (four) times daily.   PRENATAL GUMMIES/DHA & FA 0.4-32.5 MG Chew Chew 1 each by mouth daily.      Follow-up Information    [facility] Follow up in 2 week(s).   Specialty:  Obstetrics and Gynecology Contact information: [address], [place] [phone]          Signed: Florian Buff 11/03/2016, 8:47 AM

## 2016-11-03 NOTE — Discharge Instructions (Signed)

## 2016-11-03 NOTE — Progress Notes (Signed)
Pt discharged with written instructions. Pt verbalized an understanding. No concerns noted. Kellyann Ordway L Kam Rahimi, RN 

## 2016-11-04 ENCOUNTER — Ambulatory Visit (HOSPITAL_COMMUNITY): Payer: No Typology Code available for payment source

## 2016-11-04 ENCOUNTER — Other Ambulatory Visit: Payer: Self-pay | Admitting: Obstetrics

## 2016-11-04 ENCOUNTER — Encounter (HOSPITAL_COMMUNITY): Payer: Self-pay

## 2016-11-04 DIAGNOSIS — O2340 Unspecified infection of urinary tract in pregnancy, unspecified trimester: Principal | ICD-10-CM

## 2016-11-04 DIAGNOSIS — B951 Streptococcus, group B, as the cause of diseases classified elsewhere: Secondary | ICD-10-CM

## 2016-11-04 MED ORDER — AMOXICILLIN-POT CLAVULANATE 875-125 MG PO TABS: 1.0000 | ORAL_TABLET | Freq: Two times a day (BID) | ORAL | 0 refills | Status: DC

## 2016-11-06 ENCOUNTER — Inpatient Hospital Stay (HOSPITAL_COMMUNITY)
Admission: AD | Admit: 2016-11-06 | Discharge: 2016-11-06 | Disposition: A | Discharge status: Home / Self Care | Payer: Medicaid Other | Source: Ambulatory Visit | Attending: Obstetrics & Gynecology | Admitting: Obstetrics & Gynecology

## 2016-11-06 ENCOUNTER — Encounter (HOSPITAL_COMMUNITY): Payer: Self-pay | Admitting: *Deleted

## 2016-11-06 DIAGNOSIS — O039 Complete or unspecified spontaneous abortion without complication: Secondary | ICD-10-CM | POA: Insufficient documentation

## 2016-11-06 DIAGNOSIS — O99330 Smoking (tobacco) complicating pregnancy, unspecified trimester: Secondary | ICD-10-CM

## 2016-11-06 DIAGNOSIS — O09219 Supervision of pregnancy with history of pre-term labor, unspecified trimester: Secondary | ICD-10-CM

## 2016-11-06 DIAGNOSIS — R1030 Lower abdominal pain, unspecified: Secondary | ICD-10-CM

## 2016-11-06 DIAGNOSIS — Z3A17 17 weeks gestation of pregnancy: Secondary | ICD-10-CM | POA: Insufficient documentation

## 2016-11-06 DIAGNOSIS — Z87891 Personal history of nicotine dependence: Secondary | ICD-10-CM | POA: Diagnosis not present

## 2016-11-06 DIAGNOSIS — O09529 Supervision of elderly multigravida, unspecified trimester: Secondary | ICD-10-CM

## 2016-11-06 DIAGNOSIS — O09899 Supervision of other high risk pregnancies, unspecified trimester: Secondary | ICD-10-CM

## 2016-11-06 DIAGNOSIS — O34219 Maternal care for unspecified type scar from previous cesarean delivery: Secondary | ICD-10-CM

## 2016-11-06 LAB — CBC
HEMATOCRIT: 23.6 % — AB (ref 36.0–46.0)
HEMOGLOBIN: 8 g/dL — AB (ref 12.0–15.0)
MCH: 28 pg (ref 26.0–34.0)
MCHC: 33.9 g/dL (ref 30.0–36.0)
MCV: 82.5 fL (ref 78.0–100.0)
PLATELETS: 272 10*3/uL (ref 150–400)
RBC: 2.86 MIL/uL — AB (ref 3.87–5.11)
RDW: 13.8 % (ref 11.5–15.5)
WBC: 8.9 10*3/uL (ref 4.0–10.5)

## 2016-11-06 LAB — URINALYSIS, ROUTINE W REFLEX MICROSCOPIC
Bilirubin Urine: NEGATIVE
Glucose, UA: NEGATIVE mg/dL
Ketones, ur: 5 mg/dL — AB
Nitrite: NEGATIVE
Protein, ur: NEGATIVE mg/dL
SPECIFIC GRAVITY, URINE: 1.013 (ref 1.005–1.030)
pH: 7 (ref 5.0–8.0)

## 2016-11-06 MED ORDER — ACETAMINOPHEN 500 MG PO TABS
1000.0000 mg | ORAL_TABLET | Freq: Once | ORAL | Status: AC
  Administered 2016-11-06: 1000 mg via ORAL
  Filled 2016-11-06: qty 2

## 2016-11-06 MED ORDER — KETOROLAC TROMETHAMINE 60 MG/2ML IM SOLN
30.0000 mg | Freq: Once | INTRAMUSCULAR | Status: AC
  Administered 2016-11-06: 30 mg via INTRAMUSCULAR
  Filled 2016-11-06: qty 2

## 2016-11-06 NOTE — MAU Provider Note (Signed)
History     CSN: [id number]  Arrival date and time: 11/06/16 1644   First Provider Initiated Contact with Patient 11/06/16 1720      No chief complaint on file.  HPI Pam Vargas 36 y.o. Comes to MAU with lower abdominal pain.  Hx of miscarriage on 11-02-16, pregnancy was 17 weeks.  Was discharged on 11-03-16.    Continues to have vaginal bleeding - 4 pads per day and cramping.  Took ibuprofen 600 mg 3 times yesterday and then again at 8 am this morning.  Has not taken any additional ibuprofen today.  Also is on augmentin as she was GBS positive.  No fever or chills.  Still wonders why the baby died.  OB History    Gravida Para Term Preterm AB Living   3 3 1 1   2    SAB TAB Ectopic Multiple Live Births         0 2      Past Medical History:  Diagnosis Date  . Preterm labor     Past Surgical History:  Procedure Laterality Date  . CESAREAN SECTION    . FOOT SURGERY    . FOOT SURGERY      Family History  Problem Relation Age of Onset  . Diabetes Mother   . Anemia Mother   . Anemia Sister     Social History  Substance Use Topics  . Smoking status: Former Smoker    Packs/day: 0.30    Types: Cigarettes    Quit date: 09/22/2016  . Smokeless tobacco: Never Used     Comment: 4 cigs/day  . Alcohol use No     Comment: rare    Allergies: No Known Allergies  Prescriptions Prior to Admission  Medication Sig Dispense Refill Last Dose  . acetaminophen (TYLENOL) 325 MG tablet Take 650 mg by mouth every 6 (six) hours as needed for mild pain or moderate pain.   Past Week at Unknown time  . amoxicillin-clavulanate (AUGMENTIN) 875-125 MG tablet Take 1 tablet by mouth 2 (two) times daily. 14 tablet 0   . calcium carbonate (TUMS - DOSED IN MG ELEMENTAL CALCIUM) 500 MG chewable tablet Chew 2 tablets by mouth as needed for indigestion or heartburn.   Past Week at Unknown time  . ibuprofen (ADVIL,MOTRIN) 600 MG tablet Take 1 tablet (600 mg total) by mouth every 6 (six) hours. 30  tablet 0   . metroNIDAZOLE (FLAGYL) 500 MG tablet Take 1 tablet (500 mg total) by mouth 3 (three) times daily. 21 tablet 0 11/01/2016 at Unknown time  . misoprostol (CYTOTEC) 200 MCG tablet Take 1 tablet (200 mcg total) by mouth 4 (four) times daily. 4 tablet 0   . Prenatal MV-Min-FA-Omega-3 (PRENATAL GUMMIES/DHA & FA) 0.4-32.5 MG CHEW Chew 1 each by mouth daily.    Past Week at Unknown time    Review of Systems  Constitutional: Negative for fever.  Gastrointestinal: Positive for abdominal pain. Negative for nausea and vomiting.  Genitourinary: Positive for vaginal bleeding.   Physical Exam   Blood pressure 122/59, pulse 108, temperature 98.8 F (37.1 C), temperature source Oral, resp. rate 16, height 5\' 4"  (1.626 m), weight 125 lb 6.4 oz (56.9 kg), last menstrual period 07/01/2016, unknown if currently breastfeeding.  Physical Exam  Nursing note and vitals reviewed. Constitutional: She is oriented to person, place, and time. She appears well-developed and well-nourished.  HENT:  Head: Normocephalic.  Eyes: EOM are normal.  Neck: Neck supple.  Respiratory: Effort normal.  GI: Soft. There is tenderness. There is no rebound and no guarding.  Mild tenderness on LLQ and in midline.  Fundus palpated just above the pubic bone.  Musculoskeletal: Normal range of motion.  Neurological: She is alert and oriented to person, place, and time.  Skin: Skin is warm and dry.  Psychiatric: She has a normal mood and affect.    MAU Course  Procedures Results for orders placed or performed during the hospital encounter of 11/06/16 (from the past 24 hour(s))  Urinalysis, Routine w reflex microscopic     Status: Abnormal   Collection Time: 11/06/16  4:50 PM  Result Value Ref Range   Color, Urine YELLOW YELLOW   APPearance CLOUDY (A) CLEAR   Specific Gravity, Urine 1.013 1.005 - 1.030   pH 7.0 5.0 - 8.0   Glucose, UA NEGATIVE NEGATIVE mg/dL   Hgb urine dipstick LARGE (A) NEGATIVE   Bilirubin Urine  NEGATIVE NEGATIVE   Ketones, ur 5 (A) NEGATIVE mg/dL   Protein, ur NEGATIVE NEGATIVE mg/dL   Nitrite NEGATIVE NEGATIVE   Leukocytes, UA MODERATE (A) NEGATIVE   RBC / HPF TOO NUMEROUS TO COUNT 0 - 5 RBC/hpf   WBC, UA 6-30 0 - 5 WBC/hpf   Bacteria, UA RARE (A) NONE SEEN   Squamous Epithelial / LPF 0-5 (A) NONE SEEN  CBC     Status: Abnormal   Collection Time: 11/06/16  5:54 PM  Result Value Ref Range   WBC 8.9 4.0 - 10.5 K/uL   RBC 2.86 (L) 3.87 - 5.11 MIL/uL   Hemoglobin 8.0 (L) 12.0 - 15.0 g/dL   HCT 23.6 (L) 36.0 - 46.0 %   MCV 82.5 78.0 - 100.0 fL   MCH 28.0 26.0 - 34.0 pg   MCHC 33.9 30.0 - 36.0 g/dL   RDW 13.8 11.5 - 15.5 %   Platelets 272 150 - 400 K/uL    MDM Will give Toradol 30 mg IM and tylenol 1000 mg PO Previous records/notes from admission reviewed. Currently taking Augmentin which will cover infection. With HGB of 7 on 11-03-16, will check CBC again as she has had vaginal bleeding.  Assessment and Plan  Pain after miscarriage of 17 week pregnancy  Plan Keep the appointment you have a [facility] in the morning. Continue to eat a healthy diet. Drink at least 8 8-oz glasses of water every day. Continue to take the ibuprofen and tylenol to help you with the pain. Return if you have severe abdominal pain or severe vaginal bleeding.  Nyjah Schwake L Gatlin Kittell 11/06/2016, 5:21 PM

## 2016-11-06 NOTE — MAU Note (Signed)
Vaginal delivery 11/03/15 of 17 weeker.  Today cramping went from 5 to 7/ 10 (pain scale).  Took 1 600 mg Ibuprofen this morning at 0800.  Bleeding normal going through about 4 pads per day.

## 2016-11-06 NOTE — Discharge Instructions (Signed)
Keep the appointment you have a [facility] in the morning. Continue to eat a healthy diet. Drink at least 8 8-oz glasses of water every day. Continue to take the ibuprofen and tylenol to help you with the pain. Return if you have severe abdominal pain or severe vaginal bleeding.

## 2016-11-07 ENCOUNTER — Encounter: Payer: Medicaid Other | Admitting: Obstetrics and Gynecology

## 2016-11-29 NOTE — Progress Notes (Signed)
Pt states that she was treated by [facility].

## 2016-11-30 ENCOUNTER — Ambulatory Visit (INDEPENDENT_AMBULATORY_CARE_PROVIDER_SITE_OTHER): Payer: Medicaid Other | Admitting: Obstetrics and Gynecology

## 2016-11-30 ENCOUNTER — Encounter: Payer: Self-pay | Admitting: Obstetrics and Gynecology

## 2016-11-30 NOTE — Progress Notes (Signed)
Subjective:     Pam Vargas is a 36 y.o. female who presents for a postpartum visit. She is 4 weeks postpartum following a spontaneous vaginal delivery. I have fully reviewed the prenatal and intrapartum course. The delivery was at 52 gestational weeks, secondary to PPROM. Outcome: spontaneous vaginal delivery. Anesthesia: none. Postpartum course has been uncomplicated. Bleeding no bleeding. Bowel function is normal. Bladder function is normal. Patient is not sexually active. Contraception method is abstinence. Postpartum depression screening: negative.     Review of Systems Pertinent items are noted in HPI.   Objective:    BP (!) 97/59   Pulse 87   Ht 5\' 4"  (1.626 m)   Wt 122 lb 1.6 oz (55.4 kg)   BMI 20.96 kg/m   General:  alert, cooperative and no distress   Breasts:  inspection negative, no nipple discharge or bleeding, no masses or nodularity palpable  Lungs: clear to auscultation bilaterally  Heart:  regular rate and rhythm  Abdomen: soft, non-tender; bowel sounds normal; no masses,  no organomegaly   Vulva:  normal  Vagina: normal vagina, no discharge, exudate, lesion, or erythema  Cervix:  multiparous appearance  Corpus: normal size, contour, position, consistency, mobility, non-tender  Adnexa:  normal adnexa and no mass, fullness, tenderness  Rectal Exam:         Assessment:     Normal postpartum exam. Pap smear not done at today's visit.   Plan:    1. Contraception: none 2. Patient has returned to work already. She states that she is coping well. She declined speaking to a therapist or Rx Zoloft. She is considering conception again. Advised to wait at least 3 normal cycles. Continue prenatal vitamins.  Patient is medically cleared to resume all activities of daily living 3. Follow up in:  as needed.

## 2016-11-30 NOTE — Progress Notes (Signed)
Pt presents for pp follow up after fetal demise SVD 11/02/16. EPDS 1. Pt does not desire BC at thiis time. No LMP since delivery, still lightly spotting. Tg, cma

## 2017-03-31 ENCOUNTER — Encounter (HOSPITAL_COMMUNITY): Payer: Self-pay | Admitting: Emergency Medicine

## 2017-03-31 ENCOUNTER — Emergency Department (HOSPITAL_COMMUNITY)
Admission: EM | Admit: 2017-03-31 | Discharge: 2017-03-31 | Disposition: A | Discharge status: Home / Self Care | Payer: Medicaid Other | Attending: Emergency Medicine | Admitting: Emergency Medicine

## 2017-03-31 DIAGNOSIS — M545 Low back pain, unspecified: Secondary | ICD-10-CM

## 2017-03-31 DIAGNOSIS — Z79899 Other long term (current) drug therapy: Secondary | ICD-10-CM | POA: Insufficient documentation

## 2017-03-31 DIAGNOSIS — Y999 Unspecified external cause status: Secondary | ICD-10-CM | POA: Insufficient documentation

## 2017-03-31 DIAGNOSIS — X509XXA Other and unspecified overexertion or strenuous movements or postures, initial encounter: Secondary | ICD-10-CM | POA: Insufficient documentation

## 2017-03-31 DIAGNOSIS — Z87891 Personal history of nicotine dependence: Secondary | ICD-10-CM | POA: Diagnosis not present

## 2017-03-31 DIAGNOSIS — M62830 Muscle spasm of back: Secondary | ICD-10-CM | POA: Diagnosis not present

## 2017-03-31 DIAGNOSIS — S4991XA Unspecified injury of right shoulder and upper arm, initial encounter: Secondary | ICD-10-CM | POA: Diagnosis present

## 2017-03-31 DIAGNOSIS — Y929 Unspecified place or not applicable: Secondary | ICD-10-CM | POA: Insufficient documentation

## 2017-03-31 DIAGNOSIS — Y9389 Activity, other specified: Secondary | ICD-10-CM | POA: Diagnosis not present

## 2017-03-31 MED ORDER — CYCLOBENZAPRINE HCL 10 MG PO TABS
10.0000 mg | ORAL_TABLET | Freq: Every day | ORAL | 0 refills | Status: DC
Start: 2017-03-31 — End: 2017-05-15

## 2017-03-31 NOTE — ED Provider Notes (Signed)
[facility] DEPT Provider Note   CSN: [id number] Arrival date & time: 03/31/17  1757     History   Chief Complaint Chief Complaint  Patient presents with  . Shoulder Pain    HPI Pam Vargas is a 36 y.o. female who presents today with chief complaint acute onset, constant progressively worsening right-sided subscapular posterior thoracic pain for 3 days. She states symptoms developed after helping her family move and lifting several boxes. She has associated radiation down to the right lumbar region and at times up to the superior portion of the back. She describes her pain as a sharp twisting pain, worsened with bending and laying flat on her back. Pain in the low back is intermittent and does not radiate. She has tried Excedrin and Tylenol which have been somewhat helpful temporarily. 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 She states she has had symptoms like this in the past which were diagnosed as muscle spasm. She denies tingling, numbness, weakness, loss of consciousness or head injury. She has not had any bowel or bladder incontinence, fever or chills, IV drug use or history of cancer. She denies dysuria, hematuria, frequency, melena, abdominal pain, nausea, vomiting, diarrhea. She denies chest pain or shortness of breath.   The history is provided by the patient.    Past Medical History:  Diagnosis Date  . Preterm labor     Patient Active Problem List   Diagnosis Date Noted  . SAB (spontaneous abortion) 11/03/2016  . Premature rupture of membranes 11/02/2016  . Supervision of other high risk pregnancy, antepartum 09/12/2016  . Antepartum multigravida of advanced maternal age 09/12/2016  . Previous cesarean section complicating pregnancy, antepartum condition or complication 37/16/9678  . History of preterm delivery, currently pregnant 09/12/2016  . Tobacco use in pregnancy, antepartum 09/12/2016  . TOBACCO USER 11/02/2009    Past Surgical  History:  Procedure Laterality Date  . CESAREAN SECTION    . FOOT SURGERY    . FOOT SURGERY      OB History    Gravida Para Term Preterm AB Living   3 3 1 1   2    SAB TAB Ectopic Multiple Live Births         0 2       Home Medications    Prior to Admission medications   Medication Sig Start Date End Date Taking? Authorizing Provider  acetaminophen (TYLENOL) 325 MG tablet Take 650 mg by mouth every 6 (six) hours as needed for mild pain or moderate pain.    [provider]  amoxicillin-clavulanate (AUGMENTIN) 875-125 MG tablet Take 1 tablet by mouth 2 (two) times daily. Patient not taking: Reported on 11/30/2016 11/04/16   Shelly Bombard, MD  calcium carbonate (TUMS - DOSED IN MG ELEMENTAL CALCIUM) 500 MG chewable tablet Chew 2 tablets by mouth as needed for indigestion or heartburn.    [provider]  cyclobenzaprine (FLEXERIL) 10 MG tablet Take 1 tablet (10 mg total) by mouth at bedtime. 03/31/17   Fransico Meadow, PA-C  ibuprofen (ADVIL,MOTRIN) 600 MG tablet Take 1 tablet (600 mg total) by mouth every 6 (six) hours. Patient not taking: Reported on 11/30/2016 11/03/16   Florian Buff, MD  metroNIDAZOLE (FLAGYL) 500 MG tablet Take 1 tablet (500 mg total) by mouth 3 (three) times daily. Patient not taking: Reported on 11/30/2016 10/31/16   Starr Lake, CNM  misoprostol (CYTOTEC) 200 MCG tablet Take 1 tablet (200 mcg total) by mouth  4 (four) times daily. Patient not taking: Reported on 11/30/2016 11/03/16   Florian Buff, MD  Prenatal MV-Min-FA-Omega-3 (PRENATAL GUMMIES/DHA & FA) 0.4-32.5 MG CHEW Chew 1 each by mouth daily.     [provider]    Family History Family History  Problem Relation Age of Onset  . Diabetes Mother   . Anemia Mother   . Anemia Sister     Social History Social History  Substance Use Topics  . Smoking status: Former Smoker    Packs/day: 0.30    Types: Cigarettes    Quit date: 09/22/2016  . Smokeless tobacco:  Never Used     Comment: 4 cigs/day  . Alcohol use No     Comment: rare     Allergies   Patient has no known allergies.   Review of Systems Review of Systems  Constitutional: Negative for chills and fever.  Respiratory: Negative for shortness of breath.   Cardiovascular: Negative for chest pain.  Gastrointestinal: Negative for abdominal pain, blood in stool, diarrhea, nausea and vomiting.  Genitourinary: Negative for dysuria, frequency and hematuria.  Musculoskeletal: Positive for back pain and myalgias. Negative for neck pain.  Neurological: Negative for syncope, weakness and numbness.  All other systems reviewed and are negative.    Physical Exam Updated Vital Signs BP 114/60   Pulse 70   Temp 98 F (36.7 C) (Oral)   Resp 16   Ht 5\' 4"  (1.626 m)   Wt 55.3 kg (122 lb)   LMP 03/16/2017   SpO2 99%   BMI 20.94 kg/m   Physical Exam  Constitutional: She appears well-developed and well-nourished. No distress.  HENT:  Head: Normocephalic and atraumatic.  Eyes: Conjunctivae are normal. Pupils are equal, round, and reactive to light. Right eye exhibits no discharge. Left eye exhibits no discharge.  Neck: Normal range of motion. Neck supple. No JVD present. No tracheal deviation present.  No midline CSP TTP. No paraspinal muscle tenderness.  Cardiovascular: Normal rate, regular rhythm, normal heart sounds and intact distal pulses.   2+ radial and DP/PT pulses bl, negative Homan's bl   Pulmonary/Chest: Effort normal and breath sounds normal. She exhibits no tenderness.  Abdominal: She exhibits no distension.  Genitourinary:  Genitourinary Comments: No CVA tenderness  Musculoskeletal: Normal range of motion.  No midline thoracic or lumbar spine tenderness to palpation. Right-sided thoracic muscle spasm noted just inferior to the right scapula, this is tender to palpation. Pain elicited with upward motion and internal rotation/adduction of the right upper extremity. Negative  empty can sign, negative Hawkins sign, negative crossover test. No deformity, crepitus, or step-off noted. Right-sided paraspinal muscle tenderness noted. There is no SI joint tenderness. Negative straight leg raise bilaterally. Full range of motion of BUE and BLE with 5/5 strength of all extremities.   Neurological: She is alert. No sensory deficit.  Fluent speech, no facial droop, sensation intact globally, normal gait, and patient able to heel walk and toe walk without difficulty.   Skin: Skin is warm and dry. Capillary refill takes less than 2 seconds. She is not diaphoretic.  Psychiatric: She has a normal mood and affect. Her behavior is normal.     ED Treatments / Results  Labs (all labs ordered are listed, but only abnormal results are displayed) Labs Reviewed - No data to display  EKG  EKG Interpretation None       Radiology No results found.  Procedures Procedures (including critical care time)  Medications Ordered in ED Medications -  No data to display   Initial Impression / Assessment and Plan / ED Course  I have reviewed the triage vital signs and the nursing notes.  Pertinent labs & imaging results that were available during my care of the patient were reviewed by me and considered in my medical decision making (see chart for details).      Patient with right sided thoracic muscle spasm with associated right sided lumbago. Afebrile, VSS, neurovascularly intact. No red flag signs or symptoms, low suspicion of CES or spinal injury. Low suspicion of fracture or dislocation. Pain is likely musculoskeletal in nature with a component of spasm. Discussed treatment with NSAIDs, Tylenol, ice, heat, gentle stretching. She has had relief of symptoms with Flexeril in the past, we'll send home with small prescription to take at night to help with sleep. Advised of proper use and side effects. She will follow-up with primary care for reevaluation, or orthopedics for possible  referral to physical therapy and further workup. Discussed indications for return to the ED. Pt was given opportunity to ask questions, verbalized understanding of and agreement with plan, and is safe for discharge home at this time.   Final Clinical Impressions(s) / ED Diagnoses   Final diagnoses:  Spasm of thoracic back muscle  Acute right-sided low back pain without sciatica    New Prescriptions Discharge Medication List as of 03/31/2017  7:31 PM    START taking these medications   Details  cyclobenzaprine (FLEXERIL) 10 MG tablet Take 1 tablet (10 mg total) by mouth at bedtime., Starting Fri 03/31/2017, Print         Nils Flack, [place], PA-C 03/31/17 2001    Francine Graven, DO 03/31/17 2331

## 2017-03-31 NOTE — ED Triage Notes (Signed)
Pt verbalizes worsening acute chronic right shoulder pain post lifting boxes while moving over past 3 days.

## 2017-03-31 NOTE — Discharge Instructions (Signed)
Alternate 600mg  ibuprofen and 500mg  tylenol every 3 hours. Apply ice or heat to the effected areas. Gentle stretching exercises in the shower. You may take flexeril at night for spasm but do not drink alcohol, drive, or operate heavy machinery if it makes you too drowsy. Follow up with primary care or orthopedics for re-evaluation. Return to the ED if any concerning symptoms develop.

## 2017-04-04 ENCOUNTER — Emergency Department (HOSPITAL_COMMUNITY)
Admission: EM | Admit: 2017-04-04 | Discharge: 2017-04-04 | Disposition: A | Discharge status: Home / Self Care | Payer: Medicaid Other | Attending: Emergency Medicine | Admitting: Emergency Medicine

## 2017-04-04 ENCOUNTER — Encounter (HOSPITAL_COMMUNITY): Payer: Self-pay | Admitting: Emergency Medicine

## 2017-04-04 ENCOUNTER — Emergency Department (HOSPITAL_COMMUNITY): Payer: Medicaid Other

## 2017-04-04 DIAGNOSIS — M62838 Other muscle spasm: Secondary | ICD-10-CM

## 2017-04-04 DIAGNOSIS — Y999 Unspecified external cause status: Secondary | ICD-10-CM | POA: Insufficient documentation

## 2017-04-04 DIAGNOSIS — Z79899 Other long term (current) drug therapy: Secondary | ICD-10-CM | POA: Diagnosis not present

## 2017-04-04 DIAGNOSIS — Y929 Unspecified place or not applicable: Secondary | ICD-10-CM | POA: Diagnosis not present

## 2017-04-04 DIAGNOSIS — X58XXXA Exposure to other specified factors, initial encounter: Secondary | ICD-10-CM | POA: Diagnosis not present

## 2017-04-04 DIAGNOSIS — Y939 Activity, unspecified: Secondary | ICD-10-CM | POA: Diagnosis not present

## 2017-04-04 DIAGNOSIS — S46911A Strain of unspecified muscle, fascia and tendon at shoulder and upper arm level, right arm, initial encounter: Secondary | ICD-10-CM | POA: Diagnosis not present

## 2017-04-04 DIAGNOSIS — S4991XA Unspecified injury of right shoulder and upper arm, initial encounter: Secondary | ICD-10-CM | POA: Diagnosis present

## 2017-04-04 DIAGNOSIS — F17210 Nicotine dependence, cigarettes, uncomplicated: Secondary | ICD-10-CM | POA: Diagnosis not present

## 2017-04-04 MED ORDER — BUPIVACAINE HCL (PF) 0.5 % IJ SOLN
10.0000 mL | Freq: Once | INTRAMUSCULAR | Status: AC
Start: 2017-04-04 — End: 2017-04-04
  Administered 2017-04-04: 10 mL
  Filled 2017-04-04: qty 30

## 2017-04-04 NOTE — ED Triage Notes (Signed)
Pt states she is having burning around her right shoulder blade  Pt states she was seen here last week and was given flexeril but is not getting any relief

## 2017-04-04 NOTE — ED Provider Notes (Signed)
[facility] DEPT Provider Note   CSN: [id number] Arrival date & time: 04/04/17  1924     History   Chief Complaint Chief Complaint  Patient presents with  . Shoulder Pain    HPI Pam Vargas is a 36 y.o. female.  The history is provided by the patient and medical records. No language interpreter was used.  Shoulder Pain   Pertinent negatives include no numbness.   Pam Vargas is a 36 y.o. female  who presents to the Emergency Department complaining of constant, waxing-waning aching right posterior shoulder pain for the last week. Seen on 6/01 for similar where she is given Flexeril and told to take ibuprofen as needed for pain. She has been taking this with little relief. 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 Over the weekend, her symptoms slightly improved, but when she returned to work, pain returns and worse than before. Better with rest and worse with certain movements. No low back pain. No neck pain. No numbness or tingling.  Past Medical History:  Diagnosis Date  . Preterm labor     Patient Active Problem List   Diagnosis Date Noted  . SAB (spontaneous abortion) 11/03/2016  . Premature rupture of membranes 11/02/2016  . Supervision of other high risk pregnancy, antepartum 09/12/2016  . Antepartum multigravida of advanced maternal age 13/13/2017  . Previous cesarean section complicating pregnancy, antepartum condition or complication 78/24/2353  . History of preterm delivery, currently pregnant 09/12/2016  . Tobacco use in pregnancy, antepartum 09/12/2016  . TOBACCO USER 11/02/2009    Past Surgical History:  Procedure Laterality Date  . CESAREAN SECTION    . FOOT SURGERY    . FOOT SURGERY      OB History    Gravida Para Term Preterm AB Living   3 3 1 1   2    SAB TAB Ectopic Multiple Live Births         0 2       Home Medications    Prior to Admission medications   Medication Sig Start Date End Date Taking? Authorizing Provider    acetaminophen (TYLENOL) 325 MG tablet Take 650 mg by mouth every 6 (six) hours as needed for mild pain or moderate pain.    [provider]  amoxicillin-clavulanate (AUGMENTIN) 875-125 MG tablet Take 1 tablet by mouth 2 (two) times daily. Patient not taking: Reported on 11/30/2016 11/04/16   Shelly Bombard, MD  calcium carbonate (TUMS - DOSED IN MG ELEMENTAL CALCIUM) 500 MG chewable tablet Chew 2 tablets by mouth as needed for indigestion or heartburn.    [provider]  cyclobenzaprine (FLEXERIL) 10 MG tablet Take 1 tablet (10 mg total) by mouth at bedtime. 03/31/17   Fransico Meadow, PA-C  ibuprofen (ADVIL,MOTRIN) 600 MG tablet Take 1 tablet (600 mg total) by mouth every 6 (six) hours. Patient not taking: Reported on 11/30/2016 11/03/16   Florian Buff, MD  metroNIDAZOLE (FLAGYL) 500 MG tablet Take 1 tablet (500 mg total) by mouth 3 (three) times daily. Patient not taking: Reported on 11/30/2016 10/31/16   Starr Lake, CNM  misoprostol (CYTOTEC) 200 MCG tablet Take 1 tablet (200 mcg total) by mouth 4 (four) times daily. Patient not taking: Reported on 11/30/2016 11/03/16   Florian Buff, MD  Prenatal MV-Min-FA-Omega-3 (PRENATAL GUMMIES/DHA & FA) 0.4-32.5 MG CHEW Chew 1 each by mouth daily.     [provider]    Family History Family History  Problem Relation Age  of Onset  . Diabetes Mother   . Anemia Mother   . Anemia Sister     Social History Social History  Substance Use Topics  . Smoking status: Current Every Day Smoker    Packs/day: 0.30    Types: Cigarettes    Last attempt to quit: 09/22/2016  . Smokeless tobacco: Never Used     Comment: 4 cigs/day  . Alcohol use No     Comment: rare     Allergies   Patient has no known allergies.   Review of Systems Review of Systems  Musculoskeletal: Positive for arthralgias and myalgias.  Neurological: Negative for weakness and numbness.     Physical Exam Updated Vital Signs BP 121/79 (BP  Location: Left Arm)   Pulse 70   Temp 98.2 F (36.8 C) (Oral)   Resp 16   Ht 5\' 4"  (1.626 m)   Wt 55.3 kg (122 lb)   LMP 03/16/2017 (Exact Date)   SpO2 100%   BMI 20.94 kg/m   Physical Exam  Constitutional: She appears well-developed and well-nourished. No distress.  HENT:  Head: Normocephalic and atraumatic.  Neck: Neck supple.  Cardiovascular: Normal rate, regular rhythm and normal heart sounds.   No murmur heard. Pulmonary/Chest: Effort normal and breath sounds normal. No respiratory distress. She has no wheezes. She has no rales.  Musculoskeletal:       Arms: Full ROM of the shoulder although with pain. Negative Neer's. 2+ radial pulse. Sensation intact. 5/5 muscle strength and good grip strength.  Neurological: She is alert.  Skin: Skin is warm and dry.  Nursing note and vitals reviewed.    ED Treatments / Results  Labs (all labs ordered are listed, but only abnormal results are displayed) Labs Reviewed - No data to display  EKG  EKG Interpretation None       Radiology Dg Shoulder Right  Result Date: 04/04/2017 CLINICAL DATA:  Right shoulder pain x1 week. EXAM: RIGHT SHOULDER - 2+ VIEW COMPARISON:  None. FINDINGS: There is no evidence of fracture or dislocation. There is no evidence of arthropathy or other focal bone abnormality. Soft tissues are unremarkable. IMPRESSION: Negative. Electronically Signed   By: Ashley Royalty M.D.   On: 04/04/2017 22:40    Procedures Procedures (including critical care time)  TRIGGER POINT INJECTION PROCEDURE NOTE Procedure authorized and performed by: Ozella Almond Liddy Deam  Patient given 0.5% Marcaine injection in rhomboid trigger points. Patient consents verbally to this.   Procedure: With alcohol prep of the posterior shoulder musculature.  Palpable firm area of tenderness consistent with trigger point is localized. It was approached with 25-gauge needle and 3 mL of 0.5% Marcaine was injected. Tolerated this well with immediate  pain relief and improved range of motion.    Medications Ordered in ED Medications  bupivacaine (MARCAINE) 0.5 % injection 10 mL (10 mLs Infiltration Given by Other 04/04/17 2231)     Initial Impression / Assessment and Plan / ED Course  I have reviewed the triage vital signs and the nursing notes.  Pertinent labs & imaging results that were available during my care of the patient were reviewed by me and considered in my medical decision making (see chart for details).    Pam Vargas is a 36 y.o. female who presents to ED for right posterior shoulder pain x 1 week. Seen in ED on 6/01 and chart reviewed from this encounter. Discharged home with flexeril and encouraged to take ibuprofen PRN pain. Patient did have improvement over  the weekend, but pain worsened again when she returned to work [employer]). X-ray negative. Patient does have visible muscle spasm on exam. Trigger point injection performed and patient with immediate improvement in pain and mobility. Ortho follow up if symptoms persist. Continue ibuprofen / flexeril as needed. Ice/heat discussed as well. All questions answered.   Final Clinical Impressions(s) / ED Diagnoses   Final diagnoses:  Strain of right shoulder, initial encounter  Muscle spasm    New Prescriptions New Prescriptions   No medications on file     Jeray Shugart, Ozella Almond, Hershal Coria 04/04/17 2307    Lacretia Leigh, MD 04/07/17 [phone]

## 2017-04-04 NOTE — Discharge Instructions (Signed)
It was my pleasure taking care of you today!  Continue ibuprofen / tylenol as needed for pain. You can continue taking muscle relaxer as well, however heat or ice to the shoulder throughout the day will help even more! (instructions below).   Call the orthopedist listed if symptoms are not improved in one week to schedule a follow up appointment.   Return to the ER for new or worsening symptoms, any additional concerns.  COLD THERAPY DIRECTIONS:  Ice or gel packs can be used to reduce both pain and swelling. Ice is the most helpful within the first 24 to 48 hours after an injury or flareup from overusing a muscle or joint.  Ice is effective, has very few side effects, and is safe for most people to use.   If you expose your skin to cold temperatures for too long or without the proper protection, you can damage your skin or nerves. Watch for signs of skin damage due to cold.   HOME CARE INSTRUCTIONS  Follow these tips to use ice and cold packs safely.  Place a dry or damp towel between the ice and skin. A damp towel will cool the skin more quickly, so you may need to shorten the time that the ice is used.  For a more rapid response, add gentle compression to the ice.  Ice for no more than 10 to 20 minutes at a time. The bonier the area you are icing, the less time it will take to get the benefits of ice.  Check your skin after 5 minutes to make sure there are no signs of a poor response to cold or skin damage.  Rest 20 minutes or more in between uses.  Once your skin is numb, you can end your treatment. You can test numbness by very lightly touching your skin. The touch should be so light that you do not see the skin dimple from the pressure of your fingertip. When using ice, most people will feel these normal sensations in this order: cold, burning, aching, and numbness.

## 2017-05-15 ENCOUNTER — Inpatient Hospital Stay (HOSPITAL_COMMUNITY): Payer: Medicaid Other

## 2017-05-15 ENCOUNTER — Encounter (HOSPITAL_COMMUNITY): Payer: Self-pay | Admitting: *Deleted

## 2017-05-15 ENCOUNTER — Inpatient Hospital Stay (HOSPITAL_COMMUNITY)
Admission: AD | Admit: 2017-05-15 | Discharge: 2017-05-15 | Disposition: A | Discharge status: Home / Self Care | Payer: Medicaid Other | Source: Ambulatory Visit | Attending: Obstetrics & Gynecology | Admitting: Obstetrics & Gynecology

## 2017-05-15 DIAGNOSIS — Z3A01 Less than 8 weeks gestation of pregnancy: Secondary | ICD-10-CM | POA: Diagnosis not present

## 2017-05-15 DIAGNOSIS — Z3201 Encounter for pregnancy test, result positive: Secondary | ICD-10-CM

## 2017-05-15 DIAGNOSIS — O9989 Other specified diseases and conditions complicating pregnancy, childbirth and the puerperium: Secondary | ICD-10-CM

## 2017-05-15 DIAGNOSIS — F17210 Nicotine dependence, cigarettes, uncomplicated: Secondary | ICD-10-CM | POA: Diagnosis not present

## 2017-05-15 DIAGNOSIS — O209 Hemorrhage in early pregnancy, unspecified: Secondary | ICD-10-CM | POA: Diagnosis not present

## 2017-05-15 DIAGNOSIS — O26891 Other specified pregnancy related conditions, first trimester: Secondary | ICD-10-CM | POA: Insufficient documentation

## 2017-05-15 DIAGNOSIS — O99331 Smoking (tobacco) complicating pregnancy, first trimester: Secondary | ICD-10-CM | POA: Diagnosis not present

## 2017-05-15 DIAGNOSIS — R109 Unspecified abdominal pain: Secondary | ICD-10-CM | POA: Insufficient documentation

## 2017-05-15 LAB — URINALYSIS, ROUTINE W REFLEX MICROSCOPIC
Bilirubin Urine: NEGATIVE
GLUCOSE, UA: NEGATIVE mg/dL
Hgb urine dipstick: NEGATIVE
Ketones, ur: NEGATIVE mg/dL
LEUKOCYTES UA: NEGATIVE
Nitrite: NEGATIVE
PH: 5 (ref 5.0–8.0)
Protein, ur: NEGATIVE mg/dL
SPECIFIC GRAVITY, URINE: 1.018 (ref 1.005–1.030)

## 2017-05-15 LAB — HCG, QUANTITATIVE, PREGNANCY: hCG, Beta Chain, Quant, S: 10524 m[IU]/mL — ABNORMAL HIGH (ref <5)

## 2017-05-15 LAB — WET PREP, GENITAL
Clue Cells Wet Prep HPF POC: NONE SEEN
SPERM: NONE SEEN
Trich, Wet Prep: NONE SEEN
YEAST WET PREP: NONE SEEN

## 2017-05-15 LAB — CBC
HEMATOCRIT: 37 % (ref 36.0–46.0)
HEMOGLOBIN: 12.2 g/dL (ref 12.0–15.0)
MCH: 26.8 pg (ref 26.0–34.0)
MCHC: 33 g/dL (ref 30.0–36.0)
MCV: 81.3 fL (ref 78.0–100.0)
Platelets: 205 10*3/uL (ref 150–400)
RBC: 4.55 MIL/uL (ref 3.87–5.11)
RDW: 14.1 % (ref 11.5–15.5)
WBC: 5.9 10*3/uL (ref 4.0–10.5)

## 2017-05-15 LAB — POCT PREGNANCY, URINE: Preg Test, Ur: POSITIVE — AB

## 2017-05-15 NOTE — MAU Note (Signed)
Pt reports cramping and shoulder pain for 2 days. + HPT at that time. Pt denies bleeding, nausea, vomiting.

## 2017-05-15 NOTE — MAU Provider Note (Signed)
History     CSN: [id number] Arrival date and time: 05/15/17 1711  First Provider Initiated Contact with Patient 05/15/17 1805      Chief Complaint  Patient presents with  . Abdominal Pain  . Possible Pregnancy    HPI: Pam Vargas is a 36 y.o. [phone] with IUP at Unknown who presents to maternity admissions reporting abdominal cramping and positive UPT at home. She reports LMP on 04/11/17. Started having abdominal cramping 2-3 days ago. Did home UPT which was positive. Denies vaginal bleeding, vaginal discharge, fever, chills, sweats, dysuria, nausea, vomiting, other GI or GU symptoms or other general symptoms.   Past obstetric history: OB History  Gravida Para Term Preterm AB Living  3 3 1 1   2   SAB TAB Ectopic Multiple Live Births        0 2    # Outcome Date GA Lbr Len/2nd Weight Sex Delivery Anes PTL Lv  3 Para 11/02/16 [redacted]w[redacted]d   M Vag-Spont None  FD  2 Preterm 08/04/04 [redacted]w[redacted]d   M Vag-Spont   LIV  1 Term 02/19/98 [redacted]w[redacted]d   M CS-LTranv   LIV      Past Medical History:  Diagnosis Date  . Preterm labor     Past Surgical History:  Procedure Laterality Date  . CESAREAN SECTION    . FOOT SURGERY    . FOOT SURGERY      Family History  Problem Relation Age of Onset  . Diabetes Mother   . Anemia Mother   . Anemia Sister     Social History  Substance Use Topics  . Smoking status: Current Every Day Smoker    Packs/day: 0.30    Types: Cigarettes    Last attempt to quit: 09/22/2016  . Smokeless tobacco: Never Used     Comment: 4 cigs/day  . Alcohol use No     Comment: rare    Allergies: No Known Allergies  Home meds: PNV, tylenol prn  Review of Systems - Negative except for what is mentioned in HPI.  Physical Exam   Blood pressure 108/63, pulse (!) 114, temperature 98.4 F (36.9 C), temperature source Oral, resp. rate 16, weight 125 lb 8 oz (56.9 kg), last menstrual period 04/11/2017, SpO2 100 %, unknown if currently breastfeeding.  Constitutional:  Well-developed, well-nourished female in no acute distress. HEENT: [place]/AT. Normal oral mucosa  Cardiovascular: normal rate Respiratory: normal effort, lungs CTAB.  GI: Abd soft, non-tender to palpation.  Nondistended. +BS GU: Neg CVAT.  Pelvic: normal appearing vaginal mucosa, scant physiologic discharge, no blood, cervix clean. No CMT MSK: Extremities nontender, no edema Skin: warm, dry, not diaphoretic Neurologic: Alert and oriented x 4.  Psych: normal mood and affect    MAU Course  Procedures  MDM 6:33 PM -- MSE completed. Cultures collected. Labs and U/S ordered. 7:49 PM -- Labs and U/S reviewed: Results for orders placed or performed during the hospital encounter of 05/15/17  Wet prep, genital  Result Value Ref Range   Yeast Wet Prep HPF POC NONE SEEN NONE SEEN   Trich, Wet Prep NONE SEEN NONE SEEN   Clue Cells Wet Prep HPF POC NONE SEEN NONE SEEN   WBC, Wet Prep HPF POC FEW (A) NONE SEEN   Sperm NONE SEEN   Urinalysis, Routine w reflex microscopic  Result Value Ref Range   Color, Urine YELLOW YELLOW   APPearance CLEAR CLEAR   Specific Gravity, Urine 1.018 1.005 - 1.030   pH 5.0 5.0 -  8.0   Glucose, UA NEGATIVE NEGATIVE mg/dL   Hgb urine dipstick NEGATIVE NEGATIVE   Bilirubin Urine NEGATIVE NEGATIVE   Ketones, ur NEGATIVE NEGATIVE mg/dL   Protein, ur NEGATIVE NEGATIVE mg/dL   Nitrite NEGATIVE NEGATIVE   Leukocytes, UA NEGATIVE NEGATIVE  CBC  Result Value Ref Range   WBC 5.9 4.0 - 10.5 K/uL   RBC 4.55 3.87 - 5.11 MIL/uL   Hemoglobin 12.2 12.0 - 15.0 g/dL   HCT 37.0 36.0 - 46.0 %   MCV 81.3 78.0 - 100.0 fL   MCH 26.8 26.0 - 34.0 pg   MCHC 33.0 30.0 - 36.0 g/dL   RDW 14.1 11.5 - 15.5 %   Platelets 205 150 - 400 K/uL  hCG, quantitative, pregnancy  Result Value Ref Range   hCG, Beta Chain, Quant, S 10,524 (H) <5 mIU/mL  Pregnancy, urine POC  Result Value Ref Range   Preg Test, Ur POSITIVE (A) NEGATIVE   U/S shows intrauterine gestational sac,  [redacted]w[redacted]d.  Assessment and Plan  Assessment: 1. Abdominal cramping   2. Positive pregnancy test    U/S shows IUP.   Plan: --Discharge home in stable condition.  --List of OB providers given, instructed to establish prenatal care --Discussed return precautions.     Bracy Pepper, Jenne Pane, MD 05/15/2017 8:01 PM

## 2017-05-15 NOTE — MAU Note (Signed)
Thinks she is preg. Having a little cramping, like menstrual cramps, but not as bad.  Having some pain below right shoulder blade.  Pain started about 2 days ago. +HPT yesterday.

## 2017-05-15 NOTE — Discharge Instructions (Signed)
[facility] Providers  ° ° °[facility]       Phone: [phone] ° °[facility] [place] Phone: [phone] ° °[facility] [place]  Phone: [phone] ° °[facility] [place]  Phone: [phone] ° °[facility] [place]  Phone: [phone] ° °[facility]       Phone: [phone] ° °[facility]    Phone: [phone]  ° °[facility]    Phone: [phone] ° °[facility]    Phone: [phone] ° °[facility]    Phone: [phone] ° °[facility]    Phone: [phone] ° °[facility]       Phone: [phone]  ° °[facility]  Phone: [phone] ° °[facility]    Phone: [phone] ° °[facility]   Phone: [phone] ° °[facility]      Phone: [phone] ° °[facility]    Phone: [phone] ° °

## 2017-05-16 LAB — GC/CHLAMYDIA PROBE AMP (~~LOC~~) NOT AT ARMC
CHLAMYDIA, DNA PROBE: NEGATIVE
Neisseria Gonorrhea: NEGATIVE

## 2017-05-24 ENCOUNTER — Inpatient Hospital Stay (HOSPITAL_COMMUNITY): Payer: Medicaid Other

## 2017-05-24 ENCOUNTER — Inpatient Hospital Stay (HOSPITAL_COMMUNITY)
Admission: AD | Admit: 2017-05-24 | Discharge: 2017-05-24 | Disposition: A | Discharge status: Home / Self Care | Payer: Medicaid Other | Source: Ambulatory Visit | Attending: Obstetrics & Gynecology | Admitting: Obstetrics & Gynecology

## 2017-05-24 ENCOUNTER — Encounter (HOSPITAL_COMMUNITY): Payer: Self-pay | Admitting: *Deleted

## 2017-05-24 DIAGNOSIS — O4691 Antepartum hemorrhage, unspecified, first trimester: Secondary | ICD-10-CM | POA: Diagnosis not present

## 2017-05-24 DIAGNOSIS — F17210 Nicotine dependence, cigarettes, uncomplicated: Secondary | ICD-10-CM | POA: Insufficient documentation

## 2017-05-24 DIAGNOSIS — Z6790 Unspecified blood type, Rh positive: Secondary | ICD-10-CM

## 2017-05-24 DIAGNOSIS — O4690 Antepartum hemorrhage, unspecified, unspecified trimester: Secondary | ICD-10-CM

## 2017-05-24 DIAGNOSIS — Z3A01 Less than 8 weeks gestation of pregnancy: Secondary | ICD-10-CM | POA: Insufficient documentation

## 2017-05-24 DIAGNOSIS — O99331 Smoking (tobacco) complicating pregnancy, first trimester: Secondary | ICD-10-CM | POA: Insufficient documentation

## 2017-05-24 NOTE — MAU Note (Signed)
Pt presents to MAU with complaints of vaginal bleeding brown in color that started last night but today it has gotten bright red. Mild abdominal cramping

## 2017-05-24 NOTE — Discharge Instructions (Signed)

## 2017-05-24 NOTE — MAU Provider Note (Signed)
History     CSN: [id number]  Arrival date and time: 05/24/17 1600   First Provider Initiated Contact with Patient 05/24/17 1707      Chief Complaint  Patient presents with  . Vaginal Bleeding  . Abdominal Pain   [id number] @[redacted]w[redacted]d  here with VB. She noticed a large amt of bright red blood on the toilet tissue around noon today. No bleeding since. No recent IC or anything pv. Mild lower abdominal cramping but not new sx. Was seen 9 days ago in MAU for cramping and US showed IUGS and YS but no FP.     OB History    Gravida Para Term Preterm AB Living   4 3 1 1   2    SAB TAB Ectopic Multiple Live Births         0 2      Past Medical History:  Diagnosis Date  . Preterm labor     Past Surgical History:  Procedure Laterality Date  . CESAREAN SECTION    . FOOT SURGERY    . FOOT SURGERY      Family History  Problem Relation Age of Onset  . Diabetes Mother   . Anemia Mother   . Anemia Sister     Social History  Substance Use Topics  . Smoking status: Current Every Day Smoker    Packs/day: 0.30    Types: Cigarettes    Last attempt to quit: 09/22/2016  . Smokeless tobacco: Never Used     Comment: 4 cigs/day  . Alcohol use No     Comment: rare    Allergies: No Known Allergies  Prescriptions Prior to Admission  Medication Sig Dispense Refill Last Dose  . Prenatal MV-Min-FA-Omega-3 (PRENATAL GUMMIES/DHA & FA) 0.4-32.5 MG CHEW Chew 3 each by mouth daily.    05/24/2017 at Unknown time  . acetaminophen (TYLENOL) 325 MG tablet Take 650 mg by mouth every 6 (six) hours as needed for mild pain or moderate pain.   05/22/2017    Review of Systems  Gastrointestinal: Positive for abdominal pain.  Genitourinary: Positive for vaginal bleeding.   Physical Exam   Blood pressure 116/67, pulse 95, temperature 98.1 F (36.7 C), resp. rate 18, weight 125 lb (56.7 kg), last menstrual period 04/11/2017, unknown if currently breastfeeding.  Physical Exam  Constitutional: She is  oriented to person, place, and time. She appears well-developed and well-nourished. No distress.  HENT:  Head: Normocephalic and atraumatic.  Neck: Normal range of motion.  Respiratory: Effort normal. No respiratory distress.  Genitourinary:  Genitourinary Comments: External: no lesions or erythema Vagina: rugated, pink, moist, small drk brown discharge, no active bleeding, cervix closed/long Uterus: non enlarged, anteverted, non tender, no CMT Adnexae: no masses, no tenderness left, no tenderness right   Musculoskeletal: Normal range of motion.  Neurological: She is alert and oriented to person, place, and time.  Skin: Skin is warm and dry.  Psychiatric: She has a normal mood and affect.   US Ob Transvaginal  Result Date: 05/24/2017 CLINICAL DATA:  Vaginal bleeding in the first trimester of pregnancy. EXAM: TRANSVAGINAL OB ULTRASOUND TECHNIQUE: Transvaginal ultrasound was performed for complete evaluation of the gestation as well as the maternal uterus, adnexal regions, and pelvic cul-de-sac. COMPARISON:  Ultrasound of May 15, 2017. FINDINGS: Intrauterine gestational sac: Visualized. Yolk sac:  Visualized. Embryo:  Visualized. Cardiac Activity: Visualized. Heart Rate:  Too small to measure. CRL:   2.6  mm   5 w 5 d  [place] EDC: January 19, 2018. Subchorionic hemorrhage:  None visualized. Maternal uterus/adnexae: Corpus luteum cyst seen in right ovary. Left ovary appears normal. IMPRESSION: Single live intrauterine gestation of 5 weeks 5 days. Electronically Signed   By: Marijo Conception, M.D.   On: 05/24/2017 18:04   MAU Course  Procedures  MDM [place] ordered and reviewed. Live IUP seen on [place], no [place]. Explained to pt that pregnancy looks normal today but cannot predict risk for miscarriage. Stable for discharge home.   Assessment and Plan   1. Blood type, Rh positive   2. Vaginal bleeding in pregnancy    Discharge home SAB/bleeding return precautions Follow up with [facility] in 4 weeks to start care  Allergies as of 05/24/2017   No Known Allergies     Medication List    TAKE these medications   acetaminophen 325 MG tablet Commonly known as:  TYLENOL Take 650 mg by mouth every 6 (six) hours as needed for mild pain or moderate pain.   PRENATAL GUMMIES/DHA & FA 0.4-32.5 MG Chew Chew 3 each by mouth daily.      Julianne Handler, CNM 05/24/2017, 5:10 PM

## 2017-06-05 ENCOUNTER — Ambulatory Visit (INDEPENDENT_AMBULATORY_CARE_PROVIDER_SITE_OTHER): Payer: Medicaid Other | Admitting: Internal Medicine

## 2017-06-05 ENCOUNTER — Encounter: Payer: Self-pay | Admitting: Internal Medicine

## 2017-06-05 VITALS — BP 90/68 | HR 75 | Temp 98.2°F | Ht 62.5 in | Wt 123.0 lb

## 2017-06-05 DIAGNOSIS — N6009 Solitary cyst of unspecified breast: Secondary | ICD-10-CM | POA: Insufficient documentation

## 2017-06-05 DIAGNOSIS — Z3A01 Less than 8 weeks gestation of pregnancy: Secondary | ICD-10-CM

## 2017-06-05 DIAGNOSIS — N63 Unspecified lump in unspecified breast: Secondary | ICD-10-CM | POA: Diagnosis not present

## 2017-06-05 NOTE — Patient Instructions (Signed)
I have placed a referral for you to get a mammogram and ultrasound of your breasts. I have also placed a referral for [facility]

## 2017-06-05 NOTE — Progress Notes (Signed)
   [facility] Kerrin Mo, MD Phone: [phone]  Reason For Visit: Establish for Prenatal Care     # Lump on Right Breast  - Patient has had lump on breast for about 1 year - Increased in size as patient breast have grown during pregnancy  - No skin change, no nipple discharge  - No pain associated with it   # Pregnancy  - Started prenatal vitamins, has been trying to get pregnant - Preterm labor and delivery of second child at 24 weeks  - Lost baby at 38 weeks last year, received 28- progesterone with that pregnancy  - Patient with preterm labor with second child,  Past Medical History Reviewed problem list.  Medications- reviewed and updated No additions to family history Social history- patient is a recently stopped smoking   Objective: BP 90/68 (BP Location: Right Arm, Patient Position: Sitting, Cuff Size: Normal)   Pulse 75   Temp 98.2 F (36.8 C) (Oral)   Ht 5' 2.5" (1.588 m)   Wt 123 lb (55.8 kg)   LMP 04/11/2017   SpO2 94%   BMI 22.14 kg/m  Gen: NAD, alert, cooperative with exam HEENT: Normal    Neck: No masses palpated. No lymphadenopathy    Ears: Tympanic membranes intact, normal light reflex, no erythema, no bulging    Eyes: PERRLA, EOMI    Nose: nasal turbinates moist    Throat: moist mucus membranes, no erythema Cardio: regular rate and rhythm, S1S2 heard, no murmurs appreciated Pulm: clear to auscultation bilaterally, no wheezes, rhonchi or rales Breast: Left breast without specific lesion, noted to lumpy, no axillary lymphadenopathy, Right breast with possibly mass over the right nipple, per patient singifcant change, no right axillary lymphadenopathy  GI: soft, non-tender, non-distended, bowel sounds present, no hepatomegaly, no splenomegaly Extremities: warm, well perfused, No edema, cyanosis or clubbing;  MSK: Normal gait and station Skin: dry, intact, no rashes or lesions Neuro: Strength and sensation grossly  intact  Patient reports no  vision/ hearing changes,anorexia, weight change, fever ,adenopathy, persistant / recurrent hoarseness, swallowing issues, chest pain, edema,persistant / recurrent cough, hemoptysis, dyspnea(rest, exertional, paroxysmal nocturnal), gastrointestinal  bleeding (melena, rectal bleeding), abdominal pain, excessive heart burn, GU symptoms(dysuria, hematuria, pyuria, voiding/incontinence  Issues) syncope, focal weakness, severe memory loss, concerning skin lesions, depression, anxiety, abnormal bruising/bleeding, major joint swelling, breast masses or abnormal vaginal bleeding.    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 Recently increased in size since pregnancy.  - MM Digital Diagnostic Bilat; Future - US BREAST LTD UNI RIGHT INC AXILLA; Future

## 2017-06-06 NOTE — Assessment & Plan Note (Signed)
7 weeks and 4 days via recently ultrasound obtained at the MAU  - Hx of preterm labor, SAB during 2nd trimester - therefore high risk pregnancy  - Refer to [facility]

## 2017-06-06 NOTE — Assessment & Plan Note (Signed)
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 Recently increased in size since pregnancy.  - MM Digital Diagnostic Bilat; Future - US BREAST LTD UNI RIGHT INC AXILLA; Future

## 2017-06-15 ENCOUNTER — Other Ambulatory Visit: Payer: Self-pay | Admitting: Internal Medicine

## 2017-06-15 ENCOUNTER — Telehealth: Payer: Self-pay | Admitting: *Deleted

## 2017-06-15 ENCOUNTER — Ambulatory Visit
Admission: RE | Admit: 2017-06-15 | Discharge: 2017-06-15 | Disposition: A | Discharge status: Home / Self Care | Payer: Medicaid Other | Source: Ambulatory Visit | Attending: Family Medicine | Admitting: Family Medicine

## 2017-06-15 DIAGNOSIS — N63 Unspecified lump in unspecified breast: Secondary | ICD-10-CM

## 2017-06-15 NOTE — Telephone Encounter (Signed)
Received message on nurse line from Janett Billow at [facility] [phone]) requesting order for left breast US in addition to right. VO received from Dr. Emmaline Life and Janett Billow notified. Hubbard Hartshorn, RN, BSN  '

## 2017-06-20 ENCOUNTER — Encounter: Payer: Self-pay | Admitting: Internal Medicine

## 2017-06-20 DIAGNOSIS — N6002 Solitary cyst of left breast: Secondary | ICD-10-CM | POA: Insufficient documentation

## 2017-06-20 DIAGNOSIS — N6001 Solitary cyst of right breast: Secondary | ICD-10-CM | POA: Insufficient documentation

## 2017-06-25 ENCOUNTER — Encounter (HOSPITAL_COMMUNITY): Payer: Self-pay

## 2017-06-25 ENCOUNTER — Inpatient Hospital Stay (HOSPITAL_COMMUNITY): Payer: Medicaid Other

## 2017-06-25 ENCOUNTER — Inpatient Hospital Stay (HOSPITAL_COMMUNITY)
Admission: AD | Admit: 2017-06-25 | Discharge: 2017-06-25 | Disposition: A | Discharge status: Home / Self Care | Payer: Medicaid Other | Source: Ambulatory Visit | Attending: Obstetrics and Gynecology | Admitting: Obstetrics and Gynecology

## 2017-06-25 DIAGNOSIS — O034 Incomplete spontaneous abortion without complication: Secondary | ICD-10-CM | POA: Insufficient documentation

## 2017-06-25 DIAGNOSIS — O209 Hemorrhage in early pregnancy, unspecified: Secondary | ICD-10-CM

## 2017-06-25 DIAGNOSIS — IMO0002 Reserved for concepts with insufficient information to code with codable children: Secondary | ICD-10-CM

## 2017-06-25 DIAGNOSIS — O039 Complete or unspecified spontaneous abortion without complication: Secondary | ICD-10-CM | POA: Diagnosis not present

## 2017-06-25 LAB — CBC WITH DIFFERENTIAL/PLATELET
BASOS ABS: 0 10*3/uL (ref 0.0–0.1)
BASOS PCT: 0 %
EOS PCT: 4 %
Eosinophils Absolute: 0.3 10*3/uL (ref 0.0–0.7)
HCT: 35.1 % — ABNORMAL LOW (ref 36.0–46.0)
Hemoglobin: 11.8 g/dL — ABNORMAL LOW (ref 12.0–15.0)
Lymphocytes Relative: 43 %
Lymphs Abs: 2.9 10*3/uL (ref 0.7–4.0)
MCH: 26.9 pg (ref 26.0–34.0)
MCHC: 33.6 g/dL (ref 30.0–36.0)
MCV: 80 fL (ref 78.0–100.0)
MONO ABS: 0.4 10*3/uL (ref 0.1–1.0)
Monocytes Relative: 6 %
Neutro Abs: 3.2 10*3/uL (ref 1.7–7.7)
Neutrophils Relative %: 47 %
PLATELETS: 197 10*3/uL (ref 150–400)
RBC: 4.39 MIL/uL (ref 3.87–5.11)
RDW: 13.6 % (ref 11.5–15.5)
WBC: 6.8 10*3/uL (ref 4.0–10.5)

## 2017-06-25 LAB — WET PREP, GENITAL
SPERM: NONE SEEN
TRICH WET PREP: NONE SEEN
YEAST WET PREP: NONE SEEN

## 2017-06-25 LAB — URINALYSIS, ROUTINE W REFLEX MICROSCOPIC
BACTERIA UA: NONE SEEN
Bilirubin Urine: NEGATIVE
GLUCOSE, UA: NEGATIVE mg/dL
KETONES UR: NEGATIVE mg/dL
Nitrite: NEGATIVE
PROTEIN: NEGATIVE mg/dL
Specific Gravity, Urine: 1.01 (ref 1.005–1.030)
pH: 5 (ref 5.0–8.0)

## 2017-06-25 MED ORDER — IBUPROFEN 600 MG PO TABS: 600.0000 mg | ORAL_TABLET | Freq: Three times a day (TID) | ORAL | 0 refills | Status: DC | PRN

## 2017-06-25 MED ORDER — MISOPROSTOL 200 MCG PO TABS: 600.0000 ug | ORAL_TABLET | Freq: Once | ORAL | 1 refills | Status: DC

## 2017-06-25 MED ORDER — PROMETHAZINE HCL 12.5 MG PO TABS: 12.5000 mg | ORAL_TABLET | Freq: Four times a day (QID) | ORAL | 0 refills | Status: DC | PRN

## 2017-06-25 NOTE — Discharge Instructions (Signed)

## 2017-06-25 NOTE — MAU Provider Note (Signed)
History    First Provider Initiated Contact with Patient 06/25/17 1916      Chief Complaint:  Vaginal Bleeding   Pam Vargas is  36 y.o. [id number] Patient's last menstrual period was 04/11/2017.Marland Kitchen Patient is here for increased vaginal bleeding.  She is [redacted]w[redacted]d weeks gestation by early ultrasound.    Since her last visit, the patient is with new complaint.     ROS Abdominal Pain: Mild cramping this am. None now. Vaginal bleeding: lighter than period.Dark red.  Passage of clots or tissue: Few, small clots Dizziness: denies  O POS   Physical Exam   Patient Vitals for the past 24 hrs:  BP Temp Temp src Pulse Resp SpO2 Weight  06/25/17 1826 - 98.5 F (36.9 C) - - - - -  06/25/17 1824 118/67 - Oral 85 16 100 % 125 lb (56.7 kg)   Constitutional: Well-nourished female in no apparent distress. No pallor Neuro: Alert and oriented 4 Cardiovascular: Normal rate Respiratory: Normal effort and rate Abdomen: Soft, nontender Gynecological Exam: normal external genitalia, vulva, vagina, cervix, uterus and adnexa  Labs: Results for orders placed or performed during the hospital encounter of 06/25/17 (from the past 24 hour(s))  Urinalysis, Routine w reflex microscopic   Collection Time: 06/25/17  6:29 PM  Result Value Ref Range   Color, Urine YELLOW YELLOW   APPearance CLEAR CLEAR   Specific Gravity, Urine 1.010 1.005 - 1.030   pH 5.0 5.0 - 8.0   Glucose, UA NEGATIVE NEGATIVE mg/dL   Hgb urine dipstick LARGE (A) NEGATIVE   Bilirubin Urine NEGATIVE NEGATIVE   Ketones, ur NEGATIVE NEGATIVE mg/dL   Protein, ur NEGATIVE NEGATIVE mg/dL   Nitrite NEGATIVE NEGATIVE   Leukocytes, UA TRACE (A) NEGATIVE   RBC / HPF 0-5 0 - 5 RBC/hpf   WBC, UA 0-5 0 - 5 WBC/hpf   Bacteria, UA NONE SEEN NONE SEEN   Squamous Epithelial / LPF 0-5 (A) NONE SEEN   Mucus PRESENT   Wet prep, genital   Collection Time: 06/25/17  7:34 PM  Result Value Ref Range   Yeast Wet Prep HPF POC NONE SEEN NONE SEEN   Trich, Wet Prep NONE SEEN NONE SEEN   Clue Cells Wet Prep HPF POC PRESENT (A) NONE SEEN   WBC, Wet Prep HPF POC MANY (A) NONE SEEN   Sperm NONE SEEN   CBC with Differential/Platelet   Collection Time: 06/25/17  7:44 PM  Result Value Ref Range   WBC 6.8 4.0 - 10.5 K/uL   RBC 4.39 3.87 - 5.11 MIL/uL   Hemoglobin 11.8 (L) 12.0 - 15.0 g/dL   HCT 35.1 (L) 36.0 - 46.0 %   MCV 80.0 78.0 - 100.0 fL   MCH 26.9 26.0 - 34.0 pg   MCHC 33.6 30.0 - 36.0 g/dL   RDW 13.6 11.5 - 15.5 %   Platelets 197 150 - 400 K/uL   Neutrophils Relative % 47 %   Neutro Abs 3.2 1.7 - 7.7 K/uL   Lymphocytes Relative 43 %   Lymphs Abs 2.9 0.7 - 4.0 K/uL   Monocytes Relative 6 %   Monocytes Absolute 0.4 0.1 - 1.0 K/uL   Eosinophils Relative 4 %   Eosinophils Absolute 0.3 0.0 - 0.7 K/uL   Basophils Relative 0 %   Basophils Absolute 0.0 0.0 - 0.1 K/uL    MAU course/MDM: Orders Placed This Encounter  Procedures  . Wet prep, genital  . US OB Transvaginal  . Urinalysis, Routine w  reflex microscopic  . CBC with Differential/Platelet   Attempted visualization of fetal heart rate with informal bedside ultrasound. Gestational sac seen w/ hyperechoic material inside. No FHR seen. Formal ultrasound ordered.  Care of patient turned over to Crissie Figures, M.D. at [place] [facility]. Patient awaiting ultrasound.  Tamala Julian, [place], [place] 06/25/2017, 8:16 PM  U/S results reviewed: IMPRESSION: Single intrauterine pregnancy. Average crown-rump length of 10 mm and absent fetal cardiac activity. Findings meet definitive criteria for failed pregnancy. This follows SRU consensus guidelines: Diagnostic Criteria for Nonviable Pregnancy Early in the First Trimester. Alison Stalling J Med [phone].  Discussed results above of absent fetal cardiac activity with prior U/S on 05/24/17 showing fetal cardiac activity, c/w SAB.   A/P: Incomplete SAB -Discussed medical management with patient with cytotec 600 mg po once. Repeat dose if no passage  of POC in 24 hours.  -Discussed return precautions in length -Follow up in clinic in 2 weeks  Almyra Free P. Yanna Leaks, MD OB Fellow

## 2017-06-25 NOTE — MAU Note (Signed)
+  vaginal bleeding Started on the 18th--on and off from then to now States more today than the other days Having to wear a pad; changing every 4-5 hours  +lower abdominal pain this am--but none now

## 2017-06-26 LAB — GC/CHLAMYDIA PROBE AMP (~~LOC~~) NOT AT ARMC
Chlamydia: NEGATIVE
Neisseria Gonorrhea: NEGATIVE

## 2017-06-28 ENCOUNTER — Inpatient Hospital Stay (HOSPITAL_COMMUNITY): Payer: Medicaid Other

## 2017-06-28 ENCOUNTER — Inpatient Hospital Stay (HOSPITAL_COMMUNITY)
Admission: AD | Admit: 2017-06-28 | Discharge: 2017-06-28 | Disposition: A | Discharge status: Home / Self Care | Payer: Medicaid Other | Source: Ambulatory Visit | Attending: Obstetrics and Gynecology | Admitting: Obstetrics and Gynecology

## 2017-06-28 ENCOUNTER — Encounter (HOSPITAL_COMMUNITY): Payer: Self-pay | Admitting: *Deleted

## 2017-06-28 DIAGNOSIS — O4690 Antepartum hemorrhage, unspecified, unspecified trimester: Secondary | ICD-10-CM | POA: Diagnosis present

## 2017-06-28 DIAGNOSIS — Z87891 Personal history of nicotine dependence: Secondary | ICD-10-CM | POA: Diagnosis not present

## 2017-06-28 DIAGNOSIS — O209 Hemorrhage in early pregnancy, unspecified: Secondary | ICD-10-CM

## 2017-06-28 DIAGNOSIS — O039 Complete or unspecified spontaneous abortion without complication: Secondary | ICD-10-CM

## 2017-06-28 DIAGNOSIS — Z79899 Other long term (current) drug therapy: Secondary | ICD-10-CM | POA: Insufficient documentation

## 2017-06-28 LAB — CBC
HCT: 33.5 % — ABNORMAL LOW (ref 36.0–46.0)
HEMOGLOBIN: 11.2 g/dL — AB (ref 12.0–15.0)
MCH: 27.1 pg (ref 26.0–34.0)
MCHC: 33.4 g/dL (ref 30.0–36.0)
MCV: 81.1 fL (ref 78.0–100.0)
Platelets: 199 10*3/uL (ref 150–400)
RBC: 4.13 MIL/uL (ref 3.87–5.11)
RDW: 13.9 % (ref 11.5–15.5)
WBC: 4.9 10*3/uL (ref 4.0–10.5)

## 2017-06-28 NOTE — Discharge Instructions (Signed)
Return to care   If you have heavier bleeding that soaks through more that 2 pads per hour for an hour or more  If you bleed so much that you feel like you might pass out or you do pass out  If you have significant abdominal pain that is not improved with Tylenol   If you develop a fever > 100.5   Pelvic Rest Pelvic rest may be recommended if:  Your placenta is partially or completely covering the opening of your cervix (placenta previa).  There is bleeding between the wall of the uterus and the amniotic sac in the first trimester of pregnancy (subchorionic hemorrhage).  You went into labor too early (preterm labor).  Based on your overall health and the health of your baby, your health care provider will decide if pelvic rest is right for you. How do I rest my pelvis? For as long as told by your health care provider:  Do not have sex, sexual stimulation, or an orgasm.  Do not use tampons. Do not douche. Do not put anything in your vagina.  Do not lift anything that is heavier than 10 lb (4.5 kg).  Avoid activities that take a lot of effort (are strenuous).  Avoid any activity in which your pelvic muscles could become strained.  When should I seek medical care? Seek medical care if you have:  Cramping pain in your lower abdomen.  Vaginal discharge.  A low, dull backache.  Regular contractions.  Uterine tightening.  When should I seek immediate medical care? Seek immediate medical care if:  You have vaginal bleeding and you are pregnant.  This information is not intended to replace advice given to you by your health care provider. Make sure you discuss any questions you have with your health care provider. Document Released: 02/11/2011 Document Revised: 03/24/2016 Document Reviewed: 04/20/2015 Elsevier Interactive Patient Education  [employer].

## 2017-06-28 NOTE — MAU Note (Signed)
Pt states she had a SAB on 06/25/2017. States she had a "big blob" of blood/tissue with a "cord". States it had a foul odor and had some heavy bleeding. States the bleeding did slow down. Went to bathroom later and passed another small clot. Pt states she did have some lower abdominal cramping, but took ibuprofen and now she has no pain.

## 2017-06-28 NOTE — MAU Provider Note (Signed)
History     CSN: [id number]  Arrival date and time: 06/28/17 2107  First Provider Initiated Contact with Patient 06/28/17 2225      Chief Complaint  Patient presents with  . Vaginal Bleeding  . Vaginal Discharge   HPI  Pam Vargas is a 36 y.o. [phone] female who presents with vaginal bleeding. Seen in MAU on Sunday & diagnosed with a 7w MAB. Was discharged home with cytotec which she took Sunday evening. Reports heavy amount bleeding Sunday night that improved on Monday & Tuesday. This afternoon had large amount of bleeding & passed what looked like "tissue with a tail hanging off of it" followed by heavy bleeding for 2 hours & abdominal cramping. Took ibuprofen for cramping which has improved symptoms & currently reports no pain. States bleeding is now like "light period" and has not saturated pads.  Denies fever/chills.   OB History    Gravida Para Term Preterm AB Living   4 3 1 1  0 2   SAB TAB Ectopic Multiple Live Births   0     0 2      Past Medical History:  Diagnosis Date  . Preterm labor     Past Surgical History:  Procedure Laterality Date  . CESAREAN SECTION    . FOOT SURGERY    . FOOT SURGERY      Family History  Problem Relation Age of Onset  . Diabetes Mother   . Hyperlipidemia Mother   . Hypertension Mother   . Heart disease Father     Social History  Substance Use Topics  . Smoking status: Former Smoker    Packs/day: 0.30    Types: Cigarettes    Quit date: 05/30/2017  . Smokeless tobacco: Never Used     Comment: 4 cigs/day  . Alcohol use No     Comment: rare    Allergies: No Known Allergies  Prescriptions Prior to Admission  Medication Sig Dispense Refill Last Dose  . acetaminophen (TYLENOL) 325 MG tablet Take 650 mg by mouth every 6 (six) hours as needed for mild pain or moderate pain.   05/22/2017  . ibuprofen (ADVIL,MOTRIN) 600 MG tablet Take 1 tablet (600 mg total) by mouth every 8 (eight) hours as needed. 30 tablet 0   . misoprostol  (CYTOTEC) 200 MCG tablet Take 3 tablets (600 mcg total) by mouth once. 1 tablet 1   . Prenatal MV-Min-FA-Omega-3 (PRENATAL GUMMIES/DHA & FA) 0.4-32.5 MG CHEW Chew 3 each by mouth daily.    05/24/2017 at Unknown time  . promethazine (PHENERGAN) 12.5 MG tablet Take 1 tablet (12.5 mg total) by mouth every 6 (six) hours as needed for nausea or vomiting. 10 tablet 0     Review of Systems  Constitutional: Negative.   Gastrointestinal: Positive for abdominal pain (none currently). Negative for diarrhea, nausea and vomiting.  Genitourinary: Positive for vaginal bleeding. Negative for dysuria and vaginal discharge.   Physical Exam   Blood pressure 108/67, pulse 81, temperature 98.2 F (36.8 C), temperature source Oral, resp. rate 18, height 5\' 4"  (1.626 m), weight 125 lb (56.7 kg), last menstrual period 04/11/2017, SpO2 100 %, unknown if currently breastfeeding.  Physical Exam  Nursing note and vitals reviewed. Constitutional: She is oriented to person, place, and time. She appears well-developed and well-nourished. No distress.  HENT:  Head: Normocephalic and atraumatic.  Eyes: Conjunctivae are normal. Right eye exhibits no discharge. Left eye exhibits no discharge. No scleral icterus.  Neck: Normal range of motion.  Respiratory: Effort normal. No respiratory distress.  GI: Soft. There is no tenderness.  Genitourinary: Cervix exhibits no motion tenderness and no friability. There is bleeding (small amount of dark red mucoid blood slowly oozing from os; no tissue at os) in the vagina.  Genitourinary Comments: Cervix closed  Neurological: She is alert and oriented to person, place, and time.  Skin: Skin is warm and dry. She is not diaphoretic.  Psychiatric: She has a normal mood and affect. Her behavior is normal. Judgment and thought content normal.    MAU Course  Procedures Results for orders placed or performed during the hospital encounter of 06/28/17 (from the past 24 hour(s))  CBC      Status: Abnormal   Collection Time: 06/28/17 10:35 PM  Result Value Ref Range   WBC 4.9 4.0 - 10.5 K/uL   RBC 4.13 3.87 - 5.11 MIL/uL   Hemoglobin 11.2 (L) 12.0 - 15.0 g/dL   HCT 33.5 (L) 36.0 - 46.0 %   MCV 81.1 78.0 - 100.0 fL   MCH 27.1 26.0 - 34.0 pg   MCHC 33.4 30.0 - 36.0 g/dL   RDW 13.9 11.5 - 15.5 %   Platelets 199 150 - 400 K/uL   US Ob Transvaginal  Result Date: 06/28/2017 CLINICAL DATA:  Post administration of Cytotec. EXAM: TRANSVAGINAL OB ULTRASOUND TECHNIQUE: Transvaginal ultrasound was performed for complete evaluation of the gestation as well as the maternal uterus, adnexal regions, and pelvic cul-de-sac. COMPARISON:  None. FINDINGS: Intrauterine gestational sac: None visualized. Endometrial stripe measures 8 mm. Normal appearance of the right ovary which measures 3.5 x 1.6 by 1.6 cm. The left ovary is not seen. No evidence of free pelvic fluid. IMPRESSION: Fetal pole or intrauterine gestational sac are no longer seen, consistent with completed abortion. No evidence of retained products of conception. Nonvisualization of the left ovary. Electronically Signed   By: Fidela Salisbury M.D.   On: 06/28/2017 23:24    MDM O positive CBC -- hemoglobin stable VSS, NAD Ultrasound shows no retained POCs & normal endometrial thickness  Assessment and Plan  A: 1. Complete miscarriage   2. Miscarriage   3. Vaginal bleeding in pregnancy, first trimester    P: Discharge home Pelvic rest Discussed reasons to return to MAU Patient has miscarriage follow up scheduled  Jorje Guild 06/28/2017, 10:24 PM

## 2017-06-28 NOTE — MAU Note (Signed)
Pt presents with c/o VB and foul smelling discharge  Pt reports had miscarriage 06/25/17.  States @ 1530 this afternoon passed blood clot or tissue that had foul odor, after clot passed had heavy bright VB like a period.  Reports bleeding has tapered down since earlier this afternoon.

## 2017-07-04 ENCOUNTER — Encounter: Payer: Medicaid Other | Admitting: Obstetrics and Gynecology

## 2017-07-10 ENCOUNTER — Encounter: Payer: Self-pay | Admitting: Medical

## 2017-07-10 ENCOUNTER — Ambulatory Visit (INDEPENDENT_AMBULATORY_CARE_PROVIDER_SITE_OTHER): Payer: Medicaid Other | Admitting: Medical

## 2017-07-10 VITALS — BP 98/60 | HR 68 | Ht 64.0 in | Wt 124.1 lb

## 2017-07-10 DIAGNOSIS — O039 Complete or unspecified spontaneous abortion without complication: Secondary | ICD-10-CM | POA: Diagnosis present

## 2017-07-10 MED ORDER — PRENATAL ADULT GUMMY/DHA/FA 0.4-25 MG PO CHEW: 2.0000 | CHEWABLE_TABLET | Freq: Every day | ORAL | 6 refills | Status: DC

## 2017-07-10 NOTE — Patient Instructions (Signed)

## 2017-07-10 NOTE — Progress Notes (Signed)
History:  [name] is a 36 y.o. [id number] who presents to clinic today for follow-up after SAB. Patient returned to MAU on 06/28/17 with increased vaginal bleeding and US showed complete AB at that time. The patient states bleeding stopped on 07/06/17. She denies pain or fever. She and her partner would like to try to conceive again soon. They plan to try for one more pregnancy.    The following portions of the patient's history were reviewed and updated as appropriate: allergies, current medications, family history, past medical history, social history, past surgical history and problem list.  Review of Systems:  Review of Systems  Constitutional: Negative for fever.  Gastrointestinal: Negative for abdominal pain.  Genitourinary:       Neg - vaginal bleeding      Objective:  Physical Exam BP 98/60   Pulse 68   Ht 5\' 4"  (1.626 m)   Wt 124 lb 1.6 oz (56.3 kg)   LMP  (LMP Unknown)   BMI 21.30 kg/m  Physical Exam  Constitutional: She is oriented to person, place, and time. She appears well-developed and well-nourished. No distress.  HENT:  Head: Normocephalic.  Cardiovascular: Normal rate, regular rhythm and normal heart sounds.   No murmur heard. Pulmonary/Chest: Effort normal and breath sounds normal. No respiratory distress.  Abdominal: Soft. She exhibits no distension. There is no tenderness. There is no guarding.  Neurological: She is alert and oriented to person, place, and time.  Skin: Skin is warm and dry. No erythema.  Psychiatric: She has a normal mood and affect.  Vitals reviewed.  Labs and Imaging Quant hCG and CBC today    Assessment & Plan:  Complete AB - Quant hCG and CBC today  - Rx for prenatal vitamins sent to patient's pharmacy - Discussed when it would be safe to start trying again  - Follow-up with [facility] as needed   Danielle Rankin 07/10/2017 3:37 PM

## 2017-07-11 LAB — CBC
Hematocrit: 40.5 % (ref 34.0–46.6)
Hemoglobin: 12.7 g/dL (ref 11.1–15.9)
MCH: 25.9 pg — AB (ref 26.6–33.0)
MCHC: 31.4 g/dL — AB (ref 31.5–35.7)
MCV: 83 fL (ref 79–97)
PLATELETS: 248 10*3/uL (ref 150–379)
RBC: 4.91 x10E6/uL (ref 3.77–5.28)
RDW: 13.9 % (ref 12.3–15.4)
WBC: 6.3 10*3/uL (ref 3.4–10.8)

## 2017-07-11 LAB — BETA HCG QUANT (REF LAB): hCG Quant: 43 m[IU]/mL

## 2017-07-12 ENCOUNTER — Telehealth: Payer: Self-pay | Admitting: Lab

## 2017-07-12 NOTE — Telephone Encounter (Signed)
-----   Message from Luvenia Redden, PA-C sent at 07/12/2017 11:09 AM EDT ----- CBC is normal, no anemia. HCG is still slightly elevated. Patient will need lab only visit in 1 week for repeat hCG.   Thanks,  Almyra Free

## 2017-07-12 NOTE — Telephone Encounter (Signed)
Attempted to contact patient, no answer. I left a message on answering machine to call this office when she receives my message.

## 2017-07-12 NOTE — Telephone Encounter (Signed)
Called patient to tell her that her cbc was normal, and that her HCG was slightly elevated. I told patient  she needs to schedule an appointment to get her blood drawn in 1 week

## 2017-07-14 ENCOUNTER — Telehealth: Payer: Self-pay

## 2017-07-14 NOTE — Telephone Encounter (Signed)
-----   Message from Luvenia Redden, PA-C sent at 07/12/2017 11:09 AM EDT ----- CBC is normal, no anemia. HCG is still slightly elevated. Patient will need lab only visit in 1 week for repeat hCG.   Thanks,  Almyra Free

## 2017-07-14 NOTE — Telephone Encounter (Signed)
Called patient to inform her of test results cbc and anemia results. She will need to continued with weekly quant until levels are below 5. Patient is agreeable with plan. Next appointment is 07/19/2017.

## 2017-07-17 NOTE — Telephone Encounter (Signed)
Notified pt of results of normal CBC and the need to come for f/u beta draw.  Pt stated that she has an appt scheduled already on the 07/19/17.  Pt also stated that she is having an odor in vaginal area and would like for that to be checked.  I informed pt that the nurse can have her do a self-swab to see if there is anything.  Pt stated understanding with no further questions.

## 2017-07-19 ENCOUNTER — Ambulatory Visit (INDEPENDENT_AMBULATORY_CARE_PROVIDER_SITE_OTHER): Payer: Medicaid Other

## 2017-07-19 ENCOUNTER — Other Ambulatory Visit (HOSPITAL_COMMUNITY)
Admission: RE | Admit: 2017-07-19 | Discharge: 2017-07-19 | Disposition: A | Discharge status: Home / Self Care | Payer: Medicaid Other | Source: Ambulatory Visit | Attending: Medical | Admitting: Medical

## 2017-07-19 DIAGNOSIS — O039 Complete or unspecified spontaneous abortion without complication: Secondary | ICD-10-CM | POA: Insufficient documentation

## 2017-07-19 DIAGNOSIS — Z113 Encounter for screening for infections with a predominantly sexual mode of transmission: Secondary | ICD-10-CM

## 2017-07-19 NOTE — Progress Notes (Signed)
Pt reports having vaginal discharge with some irritation.  Pt advised to do self swab and that the results will be back within 24-48 hours.  Pt stated understanding with no further questions or concerns.

## 2017-07-20 ENCOUNTER — Telehealth: Payer: Self-pay | Admitting: *Deleted

## 2017-07-20 LAB — CERVICOVAGINAL ANCILLARY ONLY
BACTERIAL VAGINITIS: POSITIVE — AB
Candida vaginitis: NEGATIVE
Chlamydia: NEGATIVE
NEISSERIA GONORRHEA: NEGATIVE
TRICH (WINDOWPATH): NEGATIVE

## 2017-07-20 LAB — BETA HCG QUANT (REF LAB): HCG QUANT: 11 m[IU]/mL

## 2017-07-20 NOTE — Telephone Encounter (Signed)
Opened in error

## 2017-07-20 NOTE — Telephone Encounter (Signed)
Pam Halim, MD  [name]        Please tell her that since she had a confirmed pregnancy in the uterus and a miscarriage, she should expect to have a period sometime in the next month. If not, tell her to call us for a repeat beta.   I called Ellerie and gave her reccommendations and results as noted.  She voices understanding.

## 2017-07-21 ENCOUNTER — Other Ambulatory Visit: Payer: Self-pay | Admitting: Medical

## 2017-07-21 DIAGNOSIS — N760 Acute vaginitis: Principal | ICD-10-CM

## 2017-07-21 DIAGNOSIS — B9689 Other specified bacterial agents as the cause of diseases classified elsewhere: Secondary | ICD-10-CM

## 2017-07-21 MED ORDER — METRONIDAZOLE 500 MG PO TABS: 500.0000 mg | ORAL_TABLET | Freq: Two times a day (BID) | ORAL | 0 refills | Status: DC

## 2017-07-21 NOTE — Progress Notes (Signed)
Agree with nursing staff's documentation of this patient's clinic encounter.  Kerry Hough, PA-C

## 2017-07-24 ENCOUNTER — Telehealth: Payer: Self-pay | Admitting: General Practice

## 2017-07-24 NOTE — Telephone Encounter (Signed)
Called patient & informed her of results. Patient states she already picked up her prescription. Patient states she started to have some bleeding this morning and thinks it's her period. Told patient it sounds like it especially given her low bhcg level the last time it was checked. Patient verbalized understanding & had no questions

## 2017-07-24 NOTE — Telephone Encounter (Signed)
-----   Message from Luvenia Redden, PA-C sent at 07/21/2017 11:34 AM EDT ----- Patient has BV. Rx for Flagyl sent to pharmacy. Please inform patient of dx and Rx  Thanks,  Almyra Free

## 2017-10-26 ENCOUNTER — Ambulatory Visit (HOSPITAL_COMMUNITY)
Admission: EM | Admit: 2017-10-26 | Discharge: 2017-10-26 | Disposition: A | Discharge status: Home / Self Care | Payer: Medicaid Other | Attending: Internal Medicine | Admitting: Internal Medicine

## 2017-10-26 ENCOUNTER — Other Ambulatory Visit: Payer: Self-pay

## 2017-10-26 ENCOUNTER — Encounter (HOSPITAL_COMMUNITY): Payer: Self-pay | Admitting: Emergency Medicine

## 2017-10-26 DIAGNOSIS — Z3202 Encounter for pregnancy test, result negative: Secondary | ICD-10-CM

## 2017-10-26 DIAGNOSIS — Z87891 Personal history of nicotine dependence: Secondary | ICD-10-CM | POA: Insufficient documentation

## 2017-10-26 DIAGNOSIS — N898 Other specified noninflammatory disorders of vagina: Secondary | ICD-10-CM

## 2017-10-26 DIAGNOSIS — Z79899 Other long term (current) drug therapy: Secondary | ICD-10-CM | POA: Diagnosis not present

## 2017-10-26 DIAGNOSIS — N760 Acute vaginitis: Secondary | ICD-10-CM | POA: Diagnosis not present

## 2017-10-26 LAB — POCT URINALYSIS DIP (DEVICE)
BILIRUBIN URINE: NEGATIVE
Glucose, UA: NEGATIVE mg/dL
HGB URINE DIPSTICK: NEGATIVE
Ketones, ur: NEGATIVE mg/dL
NITRITE: NEGATIVE
PH: 6.5 (ref 5.0–8.0)
PROTEIN: NEGATIVE mg/dL
Specific Gravity, Urine: 1.015 (ref 1.005–1.030)
UROBILINOGEN UA: 0.2 mg/dL (ref 0.0–1.0)

## 2017-10-26 LAB — POCT PREGNANCY, URINE: PREG TEST UR: NEGATIVE

## 2017-10-26 MED ORDER — FLUCONAZOLE 200 MG PO TABS: 200.0000 mg | ORAL_TABLET | Freq: Once | ORAL | 0 refills | Status: AC

## 2017-10-26 NOTE — Discharge Instructions (Signed)
Will start with treatment for yeast today. Will notify you of any positive findings and if any changes to treatment are needed.  If symptoms worsen or do not improve in the next week to return to be seen or to follow up with PCP or gynecologist.

## 2017-10-26 NOTE — ED Provider Notes (Signed)
[facility]    CSN: [id number] Arrival date & time: 10/26/17  1624     History   Chief Complaint Chief Complaint  Patient presents with  . Vaginal Discharge    HPI Pam Vargas is a 36 y.o. female.   Pam Vargas presents with complaints of vaginal itching and soreness to the vagina which started two days ago. States she noticed thick chunky white discharge. She has had mild abdominal pressure with urination. Has had BV in the past but states this is worse. Rates discomfort 6/10. Without fevers or currently abdominal pain. Without burning or frequency with urination. She is sexually active with one partner for 15 years, no known std exposures.    ROS per HPI.       Past Medical History:  Diagnosis Date  . Preterm labor     Patient Active Problem List   Diagnosis Date Noted  . Bilateral breast cysts 06/20/2017  . Breast nodule 06/05/2017  . SAB (spontaneous abortion) 11/03/2016  . Premature rupture of membranes 11/02/2016  . Previous cesarean section 09/12/2016  . History of preterm delivery 09/12/2016  . TOBACCO USER 11/02/2009    Past Surgical History:  Procedure Laterality Date  . CESAREAN SECTION    . FOOT SURGERY    . FOOT SURGERY      OB History    Gravida Para Term Preterm AB Living   4 3 1 1  0 2   SAB TAB Ectopic Multiple Live Births   0     0 2       Home Medications    Prior to Admission medications   Medication Sig Start Date End Date Taking? Authorizing Provider  acetaminophen (TYLENOL) 325 MG tablet Take 650 mg by mouth every 6 (six) hours as needed for mild pain or moderate pain.   Yes [provider]  Prenatal MV-Min-FA-Omega-3 (PRENATAL GUMMIES/DHA & FA) 0.4-32.5 MG CHEW Chew 3 each by mouth daily.    Yes [provider]  fluconazole (DIFLUCAN) 200 MG tablet Take 1 tablet (200 mg total) by mouth once for 1 dose. 10/26/17 10/26/17  Zigmund Gottron, NP  ibuprofen (ADVIL,MOTRIN) 600 MG tablet Take 1 tablet  (600 mg total) by mouth every 8 (eight) hours as needed. Patient not taking: Reported on 07/10/2017 06/25/17   Degele, Jenne Pane, MD  metroNIDAZOLE (FLAGYL) 500 MG tablet Take 1 tablet (500 mg total) by mouth 2 (two) times daily. 07/21/17   Luvenia Redden, PA-C  Prenatal MV & Min w/FA-DHA (PRENATAL ADULT GUMMY/DHA/FA) 0.4-25 MG CHEW Chew 2 tablets by mouth daily. 07/10/17   Luvenia Redden, PA-C  promethazine (PHENERGAN) 12.5 MG tablet Take 1 tablet (12.5 mg total) by mouth every 6 (six) hours as needed for nausea or vomiting. Patient not taking: Reported on 07/10/2017 06/25/17   Degele, Jenne Pane, MD    Family History Family History  Problem Relation Age of Onset  . Diabetes Mother   . Hyperlipidemia Mother   . Hypertension Mother   . Heart disease Father     Social History Social History   Tobacco Use  . Smoking status: Former Smoker    Packs/day: 0.30    Types: Cigarettes    Last attempt to quit: 05/30/2017    Years since quitting: 0.4  . Smokeless tobacco: Never Used  . Tobacco comment: 4 cigs/day  Substance Use Topics  . Alcohol use: No    Comment: rare  . Drug use: No     Allergies  Patient has no known allergies.   Review of Systems Review of Systems   Physical Exam Triage Vital Signs ED Triage Vitals  Enc Vitals Group     BP 10/26/17 1813 110/77     Pulse Rate 10/26/17 1813 88     Resp 10/26/17 1813 18     Temp 10/26/17 1813 98.3 F (36.8 C)     Temp Source 10/26/17 1813 Oral     SpO2 10/26/17 1813 100 %     Weight --      Height --      Head Circumference --      Peak Flow --      Pain Score 10/26/17 1810 8     Pain Loc --      Pain Edu? --      Excl. in [place]? --    No data found.  Updated Vital Signs BP 110/77 (BP Location: Left Arm)   Pulse 88   Temp 98.3 F (36.8 C) (Oral)   Resp 18   LMP 10/12/2017   SpO2 100%   Visual Acuity Right Eye Distance:   Left Eye Distance:   Bilateral Distance:    Right Eye Near:   Left Eye Near:      Bilateral Near:     Physical Exam  Constitutional: She is oriented to person, place, and time. She appears well-developed and well-nourished. No distress.  Cardiovascular: Normal rate, regular rhythm and normal heart sounds.  Pulmonary/Chest: Effort normal and breath sounds normal.  Abdominal: Soft. There is no tenderness. There is no CVA tenderness.  Genitourinary:  Genitourinary Comments: gu exam deferred today, without abdominal pain or fevers; urine cytology collected.   Neurological: She is alert and oriented to person, place, and time.  Skin: Skin is warm and dry.     UC Treatments / Results  Labs (all labs ordered are listed, but only abnormal results are displayed) Labs Reviewed  POCT URINALYSIS DIP (DEVICE) - Abnormal; Notable for the following components:      Result Value   Leukocytes, UA TRACE (*)    All other components within normal limits  URINE CULTURE  POCT PREGNANCY, URINE  URINE CYTOLOGY ANCILLARY ONLY    EKG  EKG Interpretation None       Radiology No results found.  Procedures Procedures (including critical care time)  Medications Ordered in UC Medications - No data to display   Initial Impression / Assessment and Plan / UC Course  I have reviewed the triage vital signs and the nursing notes.  Pertinent labs & imaging results that were available during my care of the patient were reviewed by me and considered in my medical decision making (see chart for details).     Urine sent for cytology and culture. Symptoms consistent with yeast so started diflucan x1 today. Will notify of any positive findings and if any changes to treatment are needed.  Patient verbalized understanding and agreeable to plan.    Final Clinical Impressions(s) / UC Diagnoses   Final diagnoses:  Acute vaginitis    ED Discharge Orders        Ordered    fluconazole (DIFLUCAN) 200 MG tablet   Once     10/26/17 1909       Controlled Substance Prescriptions [place]  Controlled Substance Registry consulted? Not Applicable   Zigmund Gottron, NP 10/26/17 [phone]

## 2017-10-26 NOTE — ED Triage Notes (Addendum)
Vaginal discharge, skin is itchy and sore.  Onset of symptoms 12/25.  Pressure with urination

## 2017-10-26 NOTE — ED Notes (Signed)
Sent for a dirty and clean urina specimen with instructions

## 2017-10-27 LAB — URINE CYTOLOGY ANCILLARY ONLY
Chlamydia: NEGATIVE
Neisseria Gonorrhea: NEGATIVE
Trichomonas: NEGATIVE

## 2017-10-28 LAB — URINE CULTURE

## 2017-10-30 ENCOUNTER — Telehealth (HOSPITAL_COMMUNITY): Payer: Self-pay | Admitting: Family Medicine

## 2017-10-30 MED ORDER — METRONIDAZOLE 500 MG PO TABS: 500.0000 mg | ORAL_TABLET | Freq: Two times a day (BID) | ORAL | 0 refills | Status: DC

## 2017-10-30 NOTE — Telephone Encounter (Signed)
Reviewed results and spoke with Dr. Mannie Stabile. Agreed to send pt Metronidazole 500 mg BID x 7 days to pharmacy.

## 2017-11-02 LAB — URINE CYTOLOGY ANCILLARY ONLY: Candida vaginitis: NEGATIVE

## 2017-11-03 ENCOUNTER — Telehealth (HOSPITAL_COMMUNITY): Payer: Self-pay | Admitting: *Deleted

## 2017-11-16 ENCOUNTER — Other Ambulatory Visit: Payer: Self-pay | Admitting: Obstetrics and Gynecology

## 2017-11-16 DIAGNOSIS — O09899 Supervision of other high risk pregnancies, unspecified trimester: Secondary | ICD-10-CM

## 2017-11-17 ENCOUNTER — Ambulatory Visit (INDEPENDENT_AMBULATORY_CARE_PROVIDER_SITE_OTHER): Payer: Medicaid Other | Admitting: Family Medicine

## 2017-11-17 VITALS — BP 99/65 | HR 84 | Temp 97.4°F | Ht 64.0 in | Wt 127.8 lb

## 2017-11-17 DIAGNOSIS — R300 Dysuria: Secondary | ICD-10-CM

## 2017-11-17 LAB — POCT URINALYSIS DIP (MANUAL ENTRY)
BILIRUBIN UA: NEGATIVE
BILIRUBIN UA: NEGATIVE mg/dL
GLUCOSE UA: NEGATIVE mg/dL
Leukocytes, UA: NEGATIVE
Nitrite, UA: NEGATIVE
Protein Ur, POC: NEGATIVE mg/dL
RBC UA: NEGATIVE
SPEC GRAV UA: 1.015 (ref 1.010–1.025)
Urobilinogen, UA: 0.2 E.U./dL
pH, UA: 7 (ref 5.0–8.0)

## 2017-11-17 MED ORDER — CEPHALEXIN 500 MG PO CAPS: 500.0000 mg | ORAL_CAPSULE | Freq: Four times a day (QID) | ORAL | 0 refills | Status: AC

## 2017-11-17 NOTE — Patient Instructions (Addendum)
You were seen in clinic for burning with urination and increase in frequency which, as we discussed are symptoms of a UTI.  Although your urine did not show any signs of infection, I am treating this as a UTI due to your symptoms.  You can take the antibiotic four times a day for the next 7 days.  If you develop any new or worsening symptoms, I would like for you to come back and be seen by a provider.

## 2017-11-17 NOTE — Progress Notes (Signed)
   Subjective:   Patient ID: Pam Vargas    DOB: August 18, 1981, 37 y.o. female   MRN: [id number]  CC: urinary symptoms   HPI: Pam Vargas is a 37 y.o. female who presents to clinic today for the following problem.  Burning with urination -Presented to urgent care on 12/27 and seen for vaginal discharge; was found to have BV and given a course of Flagyl 500 mg twice daily.  She states she completed the entire course.  Symptoms have since resolved. -Endorses burning with urination, feeling pressure in her vaginal area, reports urinary frequency and urgency -She has history of a UTI in the past about a year ago; treated with a course of antibiotics. - Sexually active with her fianc, they have been together for about 15 years.  No history of STDs.   ROS: Denies vaginal bleeding, fever, chills, nausea, vomiting, abdominal pain. [place]: Pertinent past medical, surgical, family, and social history were reviewed and updated as appropriate. Smoking status reviewed. Medications reviewed.   Objective:   BP 99/65 (BP Location: Right Arm, Patient Position: Sitting, Cuff Size: Normal)   Pulse 84   Temp (!) 97.4 F (36.3 C) (Oral)   Ht 5\' 4"  (1.626 m)   Wt 127 lb 12.8 oz (58 kg)   SpO2 97%   BMI 21.94 kg/m  Vitals and nursing note reviewed.  General: 37 year-old female, no acute distress CV: RRR, no MRG Lungs: Clear to auscultation bilaterally, nonlabored breathing Abdomen: Soft, nontender nondistended, positive bowel sounds Pelvic exam: VULVA: normal appearing vulva with no masses, tenderness or lesions, VAGINA: normal appearing vagina with normal color and discharge, no lesions, CERVIX: normal appearing cervix without discharge or lesions, UTERUS: uterus is normal size, shape, consistency and nontender.  No CMT.  Skin: warm, dry, no rash Extremities: warm and well perfused, normal tone  Assessment & Plan:   Dysuria History suggests possible UTI, however UA without evidence of  infection and no red flags on exam.  Declined STD testing today.  Patient is otherwise afebrile with stable vitals.  Due to presence of symptoms, will prescribe course of antibiotics for postcoital UTI prophylaxis.   -Prescription for Keflex 500 mg x7 days. - Return precautions discussed  Orders Placed This Encounter  Procedures  . POCT urinalysis dipstick   Meds ordered this encounter  Medications  . cephALEXin (KEFLEX) 500 MG capsule    Sig: Take 1 capsule (500 mg total) by mouth 4 (four) times daily for 7 days. Take for 7 days    Dispense:  28 capsule    Refill:  0   Follow-up: PRN  Lovenia Kim, MD [place], PGY-2 11/21/2017 3:22 PM

## 2017-11-21 NOTE — Assessment & Plan Note (Addendum)
History suggests possible UTI, however UA without evidence of infection and no red flags on exam.  Declined STD testing today.  Patient is otherwise afebrile with stable vitals.  Due to presence of symptoms, will prescribe course of antibiotics for postcoital UTI prophylaxis.   -Prescription for Keflex 500 mg x7 days. - Return precautions discussed

## 2017-11-29 ENCOUNTER — Telehealth: Payer: Self-pay | Admitting: *Deleted

## 2017-11-29 NOTE — Telephone Encounter (Signed)
Refill for antibiotic is not appropriate without patient being seen in clinic. Please inform patient to make a follow up appointment if she continues to have burning with urination.

## 2017-11-29 NOTE — Telephone Encounter (Signed)
Rx request form [facility] for cephalexin 500 mg capsules. Not on med list. Please advise. Deseree Kennon Holter, CMA

## 2017-12-06 ENCOUNTER — Encounter: Payer: Self-pay | Admitting: Internal Medicine

## 2017-12-06 ENCOUNTER — Ambulatory Visit (INDEPENDENT_AMBULATORY_CARE_PROVIDER_SITE_OTHER): Payer: Medicaid Other | Admitting: Internal Medicine

## 2017-12-06 ENCOUNTER — Other Ambulatory Visit (HOSPITAL_COMMUNITY)
Admission: RE | Admit: 2017-12-06 | Discharge: 2017-12-06 | Disposition: A | Discharge status: Home / Self Care | Payer: Medicaid Other | Source: Ambulatory Visit | Attending: Family Medicine | Admitting: Family Medicine

## 2017-12-06 ENCOUNTER — Other Ambulatory Visit: Payer: Self-pay

## 2017-12-06 VITALS — BP 100/60 | HR 86 | Temp 98.3°F | Wt 124.0 lb

## 2017-12-06 DIAGNOSIS — N898 Other specified noninflammatory disorders of vagina: Secondary | ICD-10-CM | POA: Insufficient documentation

## 2017-12-06 LAB — POCT WET PREP (WET MOUNT)
CLUE CELLS WET PREP WHIFF POC: NEGATIVE
TRICHOMONAS WET PREP HPF POC: ABSENT

## 2017-12-06 NOTE — Progress Notes (Signed)
   [facility] Phone: [phone]  Subjective:  Pam Vargas is a 37 year old female presenting to clinic with vaginal discharge for the last few weeks. The discharge is white in color. She notices it while wiping. The discharge does not have any odor. She endorses occasional mild itching of the skin near her vagina, but no vaginal itching. No vaginal lesions. No abdominal pain, no dysuria, no fevers. She is sexually active with one female partner, who has been her only partner for 15 years. She is not using any birth control because she is currently trying to get pregnant. She wants STD testing today.  ROS: See HPI for pertinent positives and negatives  Past Medical History- none  Family history reviewed for today's visit. No changes.  Social history- patient is a former smoker, quit in 2018  Objective: BP 100/60   Pulse 86   Temp 98.3 F (36.8 C) (Oral)   Wt 124 lb (56.2 kg)   LMP 11/07/2017   SpO2 99%   BMI 21.28 kg/m  Gen: NAD, alert, cooperative with exam GI: +BS, soft, non-tender, non-distended  GU: External genitalia normal in appearance, no genital lesions, vaginal walls normal, small amount of blood present in the vaginal vault, cervix normal, no cervical motion tenderness.  Assessment/Plan: Vaginal Discharge: May be physiologic. Wet prep negative. Small amount of bleeding seen on exam, as patient is just starting her period. - Gonorrhea, chlamydia, HIV, and RPR pending - Follow-up if worsening   Hyman Bible, MD PGY-3

## 2017-12-06 NOTE — Patient Instructions (Signed)
It was so nice to meet you!  You do not have bacterial vaginosis, yeast infection, or trichomonas.  I will call you with the rest of your lab results.  -Dr. Brett Albino

## 2017-12-06 NOTE — Assessment & Plan Note (Signed)
May be physiologic. Wet prep negative. Small amount of bleeding seen on exam, as patient is just starting her period. - Gonorrhea, chlamydia, HIV, and RPR pending - Follow-up if worsening

## 2017-12-07 ENCOUNTER — Telehealth: Payer: Self-pay | Admitting: Internal Medicine

## 2017-12-07 LAB — RPR: RPR Ser Ql: NONREACTIVE

## 2017-12-07 LAB — CERVICOVAGINAL ANCILLARY ONLY
Chlamydia: NEGATIVE
Neisseria Gonorrhea: NEGATIVE

## 2017-12-07 LAB — HIV ANTIBODY (ROUTINE TESTING W REFLEX): HIV Screen 4th Generation wRfx: NONREACTIVE

## 2017-12-07 NOTE — Telephone Encounter (Signed)
Called patient to let her know that her lab results were negative. She voiced understanding.  Hyman Bible, MD PGY-3

## 2018-02-25 ENCOUNTER — Inpatient Hospital Stay (HOSPITAL_COMMUNITY)
Admission: AD | Admit: 2018-02-25 | Discharge: 2018-02-25 | Disposition: A | Discharge status: Home / Self Care | Payer: Medicaid Other | Source: Ambulatory Visit | Attending: Obstetrics and Gynecology | Admitting: Obstetrics and Gynecology

## 2018-02-25 ENCOUNTER — Inpatient Hospital Stay (HOSPITAL_COMMUNITY): Payer: Medicaid Other

## 2018-02-25 ENCOUNTER — Encounter (HOSPITAL_COMMUNITY): Payer: Self-pay | Admitting: *Deleted

## 2018-02-25 DIAGNOSIS — B9689 Other specified bacterial agents as the cause of diseases classified elsewhere: Secondary | ICD-10-CM | POA: Insufficient documentation

## 2018-02-25 DIAGNOSIS — N898 Other specified noninflammatory disorders of vagina: Secondary | ICD-10-CM

## 2018-02-25 DIAGNOSIS — O99331 Smoking (tobacco) complicating pregnancy, first trimester: Secondary | ICD-10-CM | POA: Insufficient documentation

## 2018-02-25 DIAGNOSIS — F17210 Nicotine dependence, cigarettes, uncomplicated: Secondary | ICD-10-CM | POA: Diagnosis not present

## 2018-02-25 DIAGNOSIS — O23591 Infection of other part of genital tract in pregnancy, first trimester: Secondary | ICD-10-CM | POA: Diagnosis not present

## 2018-02-25 DIAGNOSIS — O9989 Other specified diseases and conditions complicating pregnancy, childbirth and the puerperium: Secondary | ICD-10-CM | POA: Diagnosis not present

## 2018-02-25 DIAGNOSIS — N760 Acute vaginitis: Secondary | ICD-10-CM

## 2018-02-25 DIAGNOSIS — O3680X0 Pregnancy with inconclusive fetal viability, not applicable or unspecified: Secondary | ICD-10-CM

## 2018-02-25 DIAGNOSIS — O209 Hemorrhage in early pregnancy, unspecified: Secondary | ICD-10-CM | POA: Insufficient documentation

## 2018-02-25 DIAGNOSIS — Z3A01 Less than 8 weeks gestation of pregnancy: Secondary | ICD-10-CM | POA: Insufficient documentation

## 2018-02-25 DIAGNOSIS — O09899 Supervision of other high risk pregnancies, unspecified trimester: Secondary | ICD-10-CM

## 2018-02-25 LAB — URINALYSIS, ROUTINE W REFLEX MICROSCOPIC
BACTERIA UA: NONE SEEN
BILIRUBIN URINE: NEGATIVE
Glucose, UA: NEGATIVE mg/dL
KETONES UR: NEGATIVE mg/dL
LEUKOCYTES UA: NEGATIVE
NITRITE: NEGATIVE
PH: 7 (ref 5.0–8.0)
Protein, ur: NEGATIVE mg/dL
Specific Gravity, Urine: 1.01 (ref 1.005–1.030)

## 2018-02-25 LAB — WET PREP, GENITAL
Sperm: NONE SEEN
Trich, Wet Prep: NONE SEEN
Yeast Wet Prep HPF POC: NONE SEEN

## 2018-02-25 LAB — POCT PREGNANCY, URINE: PREG TEST UR: POSITIVE — AB

## 2018-02-25 LAB — CBC
HCT: 38 % (ref 36.0–46.0)
HEMOGLOBIN: 13 g/dL (ref 12.0–15.0)
MCH: 27.5 pg (ref 26.0–34.0)
MCHC: 34.2 g/dL (ref 30.0–36.0)
MCV: 80.5 fL (ref 78.0–100.0)
Platelets: 206 10*3/uL (ref 150–400)
RBC: 4.72 MIL/uL (ref 3.87–5.11)
RDW: 14.4 % (ref 11.5–15.5)
WBC: 8.5 10*3/uL (ref 4.0–10.5)

## 2018-02-25 LAB — HCG, QUANTITATIVE, PREGNANCY: HCG, BETA CHAIN, QUANT, S: 110 m[IU]/mL — AB (ref <5)

## 2018-02-25 MED ORDER — VITAFOL GUMMIES 3.33-0.333-34.8 MG PO CHEW: 2.0000 | CHEWABLE_TABLET | Freq: Every day | ORAL | 6 refills | Status: AC

## 2018-02-25 MED ORDER — METRONIDAZOLE 500 MG PO TABS: 500.0000 mg | ORAL_TABLET | Freq: Two times a day (BID) | ORAL | 0 refills | Status: DC

## 2018-02-25 NOTE — Discharge Instructions (Signed)
Bacterial Vaginosis Bacterial vaginosis is an infection of the vagina. It happens when too many germs (bacteria) grow in the vagina. This infection puts you at risk for infections from sex (STIs). Treating this infection can lower your risk for some STIs. You should also treat this if you are pregnant. It can cause your baby to be born early. Follow these instructions at home: Medicines  Take over-the-counter and prescription medicines only as told by your doctor.  Take or use your antibiotic medicine as told by your doctor. Do not stop taking or using it even if you start to feel better. General instructions  If you your sexual partner is a woman, tell her that you have this infection. She needs to get treatment if she has symptoms. If you have a female partner, he does not need to be treated.  During treatment: ? Avoid sex. ? Do not douche. ? Avoid alcohol as told. ? Avoid breastfeeding as told.  Drink enough fluid to keep your pee (urine) clear or pale yellow.  Keep your vagina and butt (rectum) clean. ? Wash the area with warm water every day. ? Wipe from front to back after you use the toilet.  Keep all follow-up visits as told by your doctor. This is important. Preventing this condition  Do not douche.  Use only warm water to wash around your vagina.  Use protection when you have sex. This includes: ? Latex condoms. ? Dental dams.  Limit how many people you have sex with. It is best to only have sex with the same person (be monogamous).  Get tested for STIs. Have your partner get tested.  Wear underwear that is cotton or lined with cotton.  Avoid tight pants and pantyhose. This is most important in summer.  Do not use any products that have nicotine or tobacco in them. These include cigarettes and e-cigarettes. If you need help quitting, ask your doctor.  Do not use illegal drugs.  Limit how much alcohol you drink. Contact a doctor if:  Your symptoms do not get  better, even after you are treated.  You have more discharge or pain when you pee (urinate).  You have a fever.  You have pain in your belly (abdomen).  You have pain with sex.  Your bleed from your vagina between periods. Summary  This infection happens when too many germs (bacteria) grow in the vagina.  Treating this condition can lower your risk for some infections from sex (STIs).  You should also treat this if you are pregnant. It can cause early (premature) birth.  Do not stop taking or using your antibiotic medicine even if you start to feel better. This information is not intended to replace advice given to you by your health care provider. Make sure you discuss any questions you have with your health care provider. Document Released: 07/26/2008 Document Revised: 07/02/2016 Document Reviewed: 07/02/2016 Elsevier Interactive Patient Education  2017 [place].   Ectopic Pregnancy An ectopic pregnancy happens when a fertilized egg grows outside the uterus. A pregnancy cannot live outside of the uterus. This problem often happens in the fallopian tube. It is often caused by damage to the fallopian tube. If this problem is found early, you may be treated with medicine. If your tube tears or bursts open (ruptures), you will bleed inside. This is an emergency. You will need surgery. Get help right away. What are the signs or symptoms? You may have normal pregnancy symptoms at first. These include:  Missing your period.  Feeling sick to your stomach (nauseous).  Being tired.  Having tender breasts.  Then, you may start to have symptoms that are not normal. These include:  Pain with sex (intercourse).  Bleeding from the vagina. This includes light bleeding (spotting).  Belly (abdomen) or lower belly cramping or pain. This may be felt on one side.  A fast heartbeat (pulse).  Passing out (fainting) after going poop (bowel movement).  If your tube tears, you may  have symptoms such as:  Really bad pain in the belly or lower belly. This happens suddenly.  Dizziness.  Passing out.  Shoulder pain.  Get help right away if: You have any of these symptoms. This is an emergency. This information is not intended to replace advice given to you by your health care provider. Make sure you discuss any questions you have with your health care provider. Document Released: 01/13/2009 Document Revised: 03/24/2016 Document Reviewed: 05/29/2013 Elsevier Interactive Patient Education  2017 [place].    Threatened Miscarriage A threatened miscarriage is when you have vaginal bleeding during your first 20 weeks of pregnancy but the pregnancy has not ended. Your doctor will do tests to make sure you are still pregnant. The cause of the bleeding may not be known. This condition does not mean your pregnancy will end. It does increase the risk of it ending (complete miscarriage). Follow these instructions at home:  Make sure you keep all your doctor visits for prenatal care.  Get plenty of rest.  Do not have sex or use tampons if you have vaginal bleeding.  Do not douche.  Do not smoke or use drugs.  Do not drink alcohol.  Avoid caffeine. Contact a doctor if:  You have light bleeding from your vagina.  You have belly pain or cramping.  You have a fever. Get help right away if:  You have heavy bleeding from your vagina.  You have clots of blood coming from your vagina.  You have bad pain or cramps in your low back or belly.  You have fever, chills, and bad belly pain. This information is not intended to replace advice given to you by your health care provider. Make sure you discuss any questions you have with your health care provider. Document Released: 09/29/2008 Document Revised: 03/24/2016 Document Reviewed: 08/13/2013 Elsevier Interactive Patient Education  [employer].

## 2018-02-25 NOTE — MAU Note (Signed)
LMP 29 Jan 2018.  +HPT yesterday and today, started with vag odor and bleeding 2 days ago. Denies any abd pain

## 2018-02-25 NOTE — MAU Provider Note (Addendum)
History     CSN: [id number]  Arrival date and time: 02/25/18 1342   None     Chief Complaint  Patient presents with  . Vaginal Bleeding   HPI 37 yo [id number] at [redacted]w[redacted]d by 01/29/2017 LMP presenting today for the evaluation of vaginal bleeding in pregnancy and the presence of a foul vaginal odor. Patient reports a 5-day period on 01/29/2018. She reports a history of irregular menses. She reports a positive UPT yesterday and this morning. Vaginal bleeding has been present for the past 3 days. She reports changing 3 pads per day and yesterday evening she soaked through her pad and clothes. She denies any cramping pain.  OB History    Gravida  5   Para  3   Term  1   Preterm  1   AB  1   Living  2     SAB  1   TAB      Ectopic      Multiple  0   Live Births  2           Past Medical History:  Diagnosis Date  . Preterm labor     Past Surgical History:  Procedure Laterality Date  . CESAREAN SECTION    . FOOT SURGERY    . FOOT SURGERY      Family History  Problem Relation Age of Onset  . Diabetes Mother   . Hyperlipidemia Mother   . Hypertension Mother   . Heart disease Father     Social History   Tobacco Use  . Smoking status: Current Some Day Smoker    Packs/day: 0.30    Types: Cigarettes    Last attempt to quit: 05/30/2017    Years since quitting: 0.7  . Smokeless tobacco: Never Used  . Tobacco comment: 4 cigs/day  Substance Use Topics  . Alcohol use: No    Comment: rare  . Drug use: No    Allergies: No Known Allergies  Medications Prior to Admission  Medication Sig Dispense Refill Last Dose  . acetaminophen (TYLENOL) 500 MG tablet Take 1,000 mg by mouth every 6 (six) hours as needed for mild pain or headache.   Past Week at Unknown time  . Prenatal Vit-Fe Fumarate-FA (PRENATAL MULTIVITAMIN) TABS tablet Take 1 tablet by mouth daily at 12 noon.   02/24/2018 at Unknown time  . traMADol (ULTRAM) 50 MG tablet Take 50 mg by mouth every 6 (six) hours  as needed for moderate pain.   Past Week at Unknown time  . metroNIDAZOLE (FLAGYL) 500 MG tablet Take 1 tablet (500 mg total) by mouth 2 (two) times daily. (Patient not taking: Reported on 02/25/2018) 14 tablet 0 Completed Course at Unknown time  . Prenatal Vit-Fe Phos-FA-Omega (VITAFOL GUMMIES) 3.33-0.333-34.8 MG CHEW CHEW 3 GUMMIES BY MOUTH DAILY (Patient not taking: Reported on 02/25/2018) 90 tablet 0 Not Taking at Unknown time    Review of Systems  See pertinent in HPI Physical Exam   Blood pressure 109/70, pulse 90, temperature 98.6 F (37 C), temperature source Oral, resp. rate 15, height 5\' 4"  (1.626 m), weight 121 lb (54.9 kg), last menstrual period 01/29/2018, unknown if currently breastfeeding.  Physical Exam  GENERAL: Well-developed, well-nourished female in no acute distress.  LUNGS: Clear to auscultation bilaterally.  HEART: Regular rate and rhythm. ABDOMEN: Soft, nontender, nondistended. No organomegaly. PELVIC: Normal external female genitalia. Vagina is pink and rugated.  Small amount of dark blood in vaginal vault. Normal appearing cervix. Uterus  is normal in size. No adnexal mass or tenderness. EXTREMITIES: No cyanosis, clubbing, or edema, 2+ distal pulses.   MAU Course  Procedures  MDM Results for orders placed or performed during the hospital encounter of 02/25/18 (from the past 24 hour(s))  Urinalysis, Routine w reflex microscopic     Status: Abnormal   Collection Time: 02/25/18  2:19 PM  Result Value Ref Range   Color, Urine YELLOW YELLOW   APPearance CLEAR CLEAR   Specific Gravity, Urine 1.010 1.005 - 1.030   pH 7.0 5.0 - 8.0   Glucose, UA NEGATIVE NEGATIVE mg/dL   Hgb urine dipstick LARGE (A) NEGATIVE   Bilirubin Urine NEGATIVE NEGATIVE   Ketones, ur NEGATIVE NEGATIVE mg/dL   Protein, ur NEGATIVE NEGATIVE mg/dL   Nitrite NEGATIVE NEGATIVE   Leukocytes, UA NEGATIVE NEGATIVE   RBC / HPF 21-50 0 - 5 RBC/hpf   WBC, UA 0-5 0 - 5 WBC/hpf   Bacteria, UA NONE  SEEN NONE SEEN   Squamous Epithelial / LPF 0-5 0 - 5  Pregnancy, urine POC     Status: Abnormal   Collection Time: 02/25/18  2:21 PM  Result Value Ref Range   Preg Test, Ur POSITIVE (A) NEGATIVE  Wet prep, genital     Status: Abnormal   Collection Time: 02/25/18  2:29 PM  Result Value Ref Range   Yeast Wet Prep HPF POC NONE SEEN NONE SEEN   Trich, Wet Prep NONE SEEN NONE SEEN   Clue Cells Wet Prep HPF POC PRESENT (A) NONE SEEN   WBC, Wet Prep HPF POC MODERATE (A) NONE SEEN   Sperm NONE SEEN   hCG, quantitative, pregnancy     Status: Abnormal   Collection Time: 02/25/18  2:45 PM  Result Value Ref Range   hCG, Beta Chain, Quant, S 110 (H) <5 mIU/mL  CBC     Status: None   Collection Time: 02/25/18  2:45 PM  Result Value Ref Range   WBC 8.5 4.0 - 10.5 K/uL   RBC 4.72 3.87 - 5.11 MIL/uL   Hemoglobin 13.0 12.0 - 15.0 g/dL   HCT 38.0 36.0 - 46.0 %   MCV 80.5 78.0 - 100.0 fL   MCH 27.5 26.0 - 34.0 pg   MCHC 34.2 30.0 - 36.0 g/dL   RDW 14.4 11.5 - 15.5 %   Platelets 206 150 - 400 K/uL    US Ob Transvaginal  Result Date: 02/25/2018 CLINICAL DATA:  Bleeding and pain. EXAM: OBSTETRIC <14 WK [place] AND TRANSVAGINAL OB US TECHNIQUE: Both transabdominal and transvaginal ultrasound examinations were performed for complete evaluation of the gestation as well as the maternal uterus, adnexal regions, and pelvic cul-de-sac. Transvaginal technique was performed to assess early pregnancy. COMPARISON:  None. FINDINGS: Intrauterine gestational sac: None Subchorionic hemorrhage:  None visualized. Maternal uterus/adnexae: The ovaries are normal in appearance. A hyperechoic masslike region seen in the cervix, likely either nabothian cyst containing crystalline or proteinaceous debris versus a calcified leiomyoma. The largest measures 1.6 x 2 x 1.5 cm. IMPRESSION: 1. No IUP identified. In the setting of a positive pregnancy test, this could represent ectopic pregnancy, early pregnancy, or recent miscarriage.  Recommend clinical correlation and close follow-up. 2. Hyperechoic mass in the cervix, likely either a nabothian cyst containing crystalline debris versus a calcified leiomyoma. Electronically Signed   By: Dorise Bullion III M.D   On: 02/25/2018 15:37    Assessment and Plan  37 yo [id number] at [redacted]w[redacted]d by LMP with pregnancy of unknown  location and BV - reviewed ultrasound findings with the patient - Patient to present on Tuesday for quant HCG - ectopic pregnancy precautions reviewed with the patient - Rx flagyl provided for the treatment of BV   Gregg Winchell 02/25/2018, 3:47 PM

## 2018-02-26 LAB — GC/CHLAMYDIA PROBE AMP (~~LOC~~) NOT AT ARMC
Chlamydia: NEGATIVE
Neisseria Gonorrhea: NEGATIVE

## 2018-02-27 ENCOUNTER — Ambulatory Visit (INDEPENDENT_AMBULATORY_CARE_PROVIDER_SITE_OTHER): Payer: Medicaid Other | Admitting: General Practice

## 2018-02-27 DIAGNOSIS — O283 Abnormal ultrasonic finding on antenatal screening of mother: Secondary | ICD-10-CM

## 2018-02-27 DIAGNOSIS — O3680X0 Pregnancy with inconclusive fetal viability, not applicable or unspecified: Secondary | ICD-10-CM

## 2018-02-27 LAB — HCG, QUANTITATIVE, PREGNANCY: hCG, Beta Chain, Quant, S: 348 m[IU]/mL — ABNORMAL HIGH (ref <5)

## 2018-02-27 NOTE — Progress Notes (Signed)
Patient here for stat bhcg today. Patient denies pain only spotting. Patient states when she was in MAU it was bleeding like a period. Discussed with patient we are monitoring her bhcg levels today & asked she wait in lobby for results/updated plan of care. Patient verbalized understanding & had no questions at this time.  Reviewed results with Marcille Buffy who finds appropriate rise in bhcg levels- patient should have follow up ultrasound in 3 weeks.  Informed patient of results and scheduled u/s appt for 5/21. Ectopic precautions reviewed. Patient verbalized understanding to all & had no questions.

## 2018-02-27 NOTE — Progress Notes (Signed)
I have reviewed the chart and agree with nursing staff's documentation of this patient's encounter.  Marcille Buffy, CNM 02/27/2018 6:43 PM

## 2018-03-20 ENCOUNTER — Ambulatory Visit: Payer: Medicaid Other

## 2018-03-20 ENCOUNTER — Ambulatory Visit (HOSPITAL_COMMUNITY): Payer: Self-pay

## 2018-03-27 ENCOUNTER — Inpatient Hospital Stay (HOSPITAL_COMMUNITY)
Admission: AD | Admit: 2018-03-27 | Discharge: 2018-03-27 | Disposition: A | Discharge status: Home / Self Care | Payer: Medicaid Other | Source: Ambulatory Visit | Attending: Obstetrics and Gynecology | Admitting: Obstetrics and Gynecology

## 2018-03-27 ENCOUNTER — Encounter (HOSPITAL_COMMUNITY): Payer: Self-pay | Admitting: *Deleted

## 2018-03-27 ENCOUNTER — Inpatient Hospital Stay (HOSPITAL_COMMUNITY): Payer: Medicaid Other

## 2018-03-27 DIAGNOSIS — Z3A08 8 weeks gestation of pregnancy: Secondary | ICD-10-CM | POA: Insufficient documentation

## 2018-03-27 DIAGNOSIS — O26899 Other specified pregnancy related conditions, unspecified trimester: Secondary | ICD-10-CM

## 2018-03-27 DIAGNOSIS — O26891 Other specified pregnancy related conditions, first trimester: Secondary | ICD-10-CM | POA: Insufficient documentation

## 2018-03-27 DIAGNOSIS — Z3491 Encounter for supervision of normal pregnancy, unspecified, first trimester: Secondary | ICD-10-CM

## 2018-03-27 DIAGNOSIS — O283 Abnormal ultrasonic finding on antenatal screening of mother: Secondary | ICD-10-CM

## 2018-03-27 DIAGNOSIS — O3680X0 Pregnancy with inconclusive fetal viability, not applicable or unspecified: Secondary | ICD-10-CM

## 2018-03-27 DIAGNOSIS — R102 Pelvic and perineal pain: Secondary | ICD-10-CM | POA: Insufficient documentation

## 2018-03-27 DIAGNOSIS — R300 Dysuria: Secondary | ICD-10-CM | POA: Diagnosis present

## 2018-03-27 LAB — URINALYSIS, ROUTINE W REFLEX MICROSCOPIC
Bilirubin Urine: NEGATIVE
GLUCOSE, UA: NEGATIVE mg/dL
Hgb urine dipstick: NEGATIVE
KETONES UR: 20 mg/dL — AB
Leukocytes, UA: NEGATIVE
Nitrite: NEGATIVE
PROTEIN: NEGATIVE mg/dL
Specific Gravity, Urine: 1.023 (ref 1.005–1.030)
pH: 5 (ref 5.0–8.0)

## 2018-03-27 LAB — CBC
HEMATOCRIT: 37.2 % (ref 36.0–46.0)
HEMOGLOBIN: 12.4 g/dL (ref 12.0–15.0)
MCH: 27.3 pg (ref 26.0–34.0)
MCHC: 33.3 g/dL (ref 30.0–36.0)
MCV: 81.9 fL (ref 78.0–100.0)
Platelets: 187 10*3/uL (ref 150–400)
RBC: 4.54 MIL/uL (ref 3.87–5.11)
RDW: 13.9 % (ref 11.5–15.5)
WBC: 7.5 10*3/uL (ref 4.0–10.5)

## 2018-03-27 LAB — HCG, QUANTITATIVE, PREGNANCY: HCG, BETA CHAIN, QUANT, S: 191142 m[IU]/mL — AB (ref <5)

## 2018-03-27 NOTE — MAU Note (Signed)
Pt reports pressure with urination and pain after voiding.

## 2018-03-27 NOTE — Discharge Instructions (Signed)
How a Baby Grows During Pregnancy °Pregnancy begins when a female's sperm enters a female's egg (fertilization). This happens in one of the tubes (fallopian tubes) that connect the ovaries to the womb (uterus). The fertilized egg is called an embryo until it reaches 10 weeks. From 10 weeks until birth, it is called a fetus. The fertilized egg moves down the fallopian tube to the uterus. Then it implants into the lining of the uterus and begins to grow. °The developing fetus receives oxygen and nutrients through the pregnant woman's bloodstream and the tissues that grow (placenta) to support the fetus. The placenta is the life support system for the fetus. It provides nutrition and removes waste. °Learning as much as you can about your pregnancy and how your baby is developing can help you enjoy the experience. It can also make you aware of when there might be a problem and when to ask questions. °How long does a typical pregnancy last? °A pregnancy usually lasts 280 days, or about 40 weeks. Pregnancy is divided into three trimesters: °· First trimester: 0-13 weeks. °· Second trimester: 14-27 weeks. °· Third trimester: 28-40 weeks. ° °The day when your baby is considered ready to be born (full term) is your estimated date of delivery. °How does my baby develop month by month? °First month °· The fertilized egg attaches to the inside of the uterus. °· Some cells will form the placenta. Others will form the fetus. °· The arms, legs, brain, spinal cord, lungs, and heart begin to develop. °· At the end of the first month, the heart begins to beat. ° °Second month °· The bones, inner ear, eyelids, hands, and feet form. °· The genitals develop. °· By the end of 8 weeks, all major organs are developing. ° °Third month °· All of the internal organs are forming. °· Teeth develop below the gums. °· Bones and muscles begin to grow. The spine can flex. °· The skin is transparent. °· Fingernails and toenails begin to form. °· Arms  and legs continue to grow longer, and hands and feet develop. °· The fetus is about 3 in (7.6 cm) long. ° °Fourth month °· The placenta is completely formed. °· The external sex organs, neck, outer ear, eyebrows, eyelids, and fingernails are formed. °· The fetus can hear, swallow, and move its arms and legs. °· The kidneys begin to produce urine. °· The skin is covered with a white waxy coating (vernix) and very fine hair (lanugo). ° °Fifth month °· The fetus moves around more and can be felt for the first time (quickening). °· The fetus starts to sleep and wake up and may begin to suck its finger. °· The nails grow to the end of the fingers. °· The organ in the digestive system that makes bile (gallbladder) functions and helps to digest the nutrients. °· If your baby is a girl, eggs are present in her ovaries. If your baby is a boy, testicles start to move down into his scrotum. ° °Sixth month °· The lungs are formed, but the fetus is not yet able to breathe. °· The eyes open. The brain continues to develop. °· Your baby has fingerprints and toe prints. Your baby's hair grows thicker. °· At the end of the second trimester, the fetus is about 9 in (22.9 cm) long. ° °Seventh month °· The fetus kicks and stretches. °· The eyes are developed enough to sense changes in light. °· The hands can make a grasping motion. °· The   fetus responds to sound. ° °Eighth month °· All organs and body systems are fully developed and functioning. °· Bones harden and taste buds develop. The fetus may hiccup. °· Certain areas of the brain are still developing. The skull remains soft. ° °Ninth month °· The fetus gains about ½ lb (0.23 kg) each week. °· The lungs are fully developed. °· Patterns of sleep develop. °· The fetus's head typically moves into a head-down position (vertex) in the uterus to prepare for birth. If the buttocks move into a vertex position instead, the baby is breech. °· The fetus weighs 6-9 lbs (2.72-4.08 kg) and is  19-20 in (48.26-50.8 cm) long. ° °What can I do to have a healthy pregnancy and help my baby develop? °Eating and Drinking °· Eat a healthy diet. °? Talk with your health care provider to make sure that you are getting the nutrients that you and your baby need. °? Visit www.choosemyplate.gov to learn about creating a healthy diet. °· Gain a healthy amount of weight during pregnancy as advised by your health care provider. This is usually 25-35 pounds. You may need to: °? Gain more if you were underweight before getting pregnant or if you are pregnant with more than one baby. °? Gain less if you were overweight or obese when you got pregnant. ° °Medicines and Vitamins °· Take prenatal vitamins as directed by your health care provider. These include vitamins such as folic acid, iron, calcium, and vitamin D. They are important for healthy development. °· Take medicines only as directed by your health care provider. Read labels and ask a pharmacist or your health care provider whether over-the-counter medicines, supplements, and prescription drugs are safe to take during pregnancy. ° °Activities °· Be physically active as advised by your health care provider. Ask your health care provider to recommend activities that are safe for you to do, such as walking or swimming. °· Do not participate in strenuous or extreme sports. ° °Lifestyle °· Do not drink alcohol. °· Do not use any tobacco products, including cigarettes, chewing tobacco, or electronic cigarettes. If you need help quitting, ask your health care provider. °· Do not use illegal drugs. ° °Safety °· Avoid exposure to mercury, lead, or other heavy metals. Ask your health care provider about common sources of these heavy metals. °· Avoid listeria infection during pregnancy. Follow these precautions: °? Do not eat soft cheeses or deli meats. °? Do not eat hot dogs unless they have been warmed up to the point of steaming, such as in the microwave oven. °? Do not  drink unpasteurized milk. °· Avoid toxoplasmosis infection during pregnancy. Follow these precautions: °? Do not change your cat's litter box, if you have a cat. Ask someone else to do this for you. °? Wear gardening gloves while working in the yard. ° °General Instructions °· Keep all follow-up visits as directed by your health care provider. This is important. This includes prenatal care and screening tests. °· Manage any chronic health conditions. Work closely with your health care provider to keep conditions, such as diabetes, under control. ° °How do I know if my baby is developing well? °At each prenatal visit, your health care provider will do several different tests to check on your health and keep track of your baby’s development. These include: °· Fundal height. °? Your health care provider will measure your growing belly from top to bottom using a tape measure. °? Your health care provider will also feel your belly   to determine your baby's position.  Heartbeat. ? An ultrasound in the first trimester can confirm pregnancy and show a heartbeat, depending on how far along you are. ? Your health care provider will check your baby's heart rate at every prenatal visit. ? As you get closer to your delivery date, you may have regular fetal heart rate monitoring to make sure that your baby is not in distress.  Second trimester ultrasound. ? This ultrasound checks your baby's development. It also indicates your babys gender.  What should I do if I have concerns about my baby's development? Always talk with your health care provider about any concerns that you may have. This information is not intended to replace advice given to you by your health care provider. Make sure you discuss any questions you have with your health care provider. Document Released: 04/04/2008 Document Revised: 03/24/2016 Document Reviewed: 03/26/2014 Elsevier Interactive Patient Education  2018 [place] for [employer] at [facility]       Phone: [phone]  [facility] [employer] at [place] Phone: [place] for [employer] at [place]  Phone: [place] for [place] at [employer]  Phone: [place] for [place] at [place]  Phone: [facility]       Phone: [phone]  [facility]    Phone: [phone]   [facility])    Phone: [facility]    Phone: [phone]  [facility]    Phone: [place]    Phone: [phone]  [place] [facility]       Phone: [phone]   [place] [facility]  Phone: [place]    Phone: [phone]  [facility] [place]   Phone: [phone]  [facility]      Phone: [phone]  [facility]    Phone: [phone]

## 2018-03-27 NOTE — MAU Provider Note (Signed)
History    First Provider Initiated Contact with Patient 03/27/18 1848      Chief Complaint:  Dysuria   Pam Vargas is  37 y.o. [id number] Patient's last menstrual period was 01/29/2018 (exact date).. Patient is here for pelvic pressure and concern for UTI. Was also having vaginal bleeding that stopped around 03/17/18.  She is [redacted]w[redacted]d weeks gestation  by LMP.    Was seen in MAU 02/25/18 for abd pain and bleeding US showed nothing in the uterus or adnexa. Quants rose appropriately. Pt was scheduled for F/U US outpatient, but states she had to cancel due to problems getting Pregnancy Medicaid.    ROS Abdominal Pain: Neg Vaginal bleeding: none now.   Passage of clots or tissue: None Dizziness: Denies Associated Sx; Neg for dysuria, urgency, frequency. Pos for Nausea. Neg for diarrhea, constipation, fever, chills.   O POS  Her previous Quantitative HCG values are:  Results for BISMA, KLETT (MRN [id number]) as of 03/27/2018 21:04  Ref. Range 02/25/2018 14:45 02/27/2018 13:34  HCG, Beta Chain, Quant, S Latest Ref Range: <5 mIU/mL 110 (H) 348 (H)   Physical Exam   Patient Vitals for the past 24 hrs:  BP Temp Temp src Pulse Resp SpO2 Height Weight  03/27/18 2011 (!) 114/58 98.5 F (36.9 C) Oral 77 18 - - -  03/27/18 1714 (!) 111/53 98.9 F (37.2 C) Oral 86 16 100 % 5\' 4"  (1.626 m) 120 lb (54.4 kg)   Constitutional: Well-nourished female in no apparent distress. No pallor Neuro: Alert and oriented 4 Cardiovascular: Normal rate Respiratory: Normal effort and rate Abdomen: Soft, nontender Gynecological Exam: examination not indicated  Labs: Results for orders placed or performed during the hospital encounter of 03/27/18 (from the past 24 hour(s))  Urinalysis, Routine w reflex microscopic   Collection Time: 03/27/18  5:16 PM  Result Value Ref Range   Color, Urine YELLOW YELLOW   APPearance HAZY (A) CLEAR   Specific Gravity, Urine 1.023 1.005 - 1.030   pH 5.0 5.0 - 8.0   Glucose,  UA NEGATIVE NEGATIVE mg/dL   Hgb urine dipstick NEGATIVE NEGATIVE   Bilirubin Urine NEGATIVE NEGATIVE   Ketones, ur 20 (A) NEGATIVE mg/dL   Protein, ur NEGATIVE NEGATIVE mg/dL   Nitrite NEGATIVE NEGATIVE   Leukocytes, UA NEGATIVE NEGATIVE  hCG, quantitative, pregnancy   Collection Time: 03/27/18  7:11 PM  Result Value Ref Range   hCG, Beta Chain, Quant, S 191,142 (H) <5 mIU/mL  CBC   Collection Time: 03/27/18  7:11 PM  Result Value Ref Range   WBC 7.5 4.0 - 10.5 K/uL   RBC 4.54 3.87 - 5.11 MIL/uL   Hemoglobin 12.4 12.0 - 15.0 g/dL   HCT 37.2 36.0 - 46.0 %   MCV 81.9 78.0 - 100.0 fL   MCH 27.3 26.0 - 34.0 pg   MCHC 33.3 30.0 - 36.0 g/dL   RDW 13.9 11.5 - 15.5 %   Platelets 187 150 - 400 K/uL    Ultrasound Studies:   US Ob Transvaginal  Result Date: 03/27/2018 CLINICAL DATA:  Initial evaluation for early pregnancy, unknown location on previous ultrasound. Dysuria. EXAM: OBSTETRIC <14 WK [place] AND TRANSVAGINAL OB US TECHNIQUE: Both transabdominal and transvaginal ultrasound examinations were performed for complete evaluation of the gestation as well as the maternal uterus, adnexal regions, and pelvic cul-de-sac. Transvaginal technique was performed to assess early pregnancy. COMPARISON:  Prior ultrasound from 02/25/2018. FINDINGS: Intrauterine gestational sac: Single Yolk sac:  Present Embryo:  Present Cardiac Activity: Present Heart Rate: 161 bpm CRL: 17.8 mm   8 w   1 d                  [place] EDC: 11/05/2018 Subchorionic hemorrhage:  None visualized. Maternal uterus/adnexae: Ovaries normal in appearance bilaterally. No free fluid within the pelvis. IMPRESSION: 1. Single live intrauterine pregnancy as above without complication, estimated gestational age [redacted] weeks and 1 day by crown-rump length. 2. No other acute maternal uterine or adnexal abnormality identified. Electronically Signed   By: Jeannine Boga M.D.   On: 03/27/2018 19:49    MAU course/MDM: Quantitative hCG, [place], UA  ordered  Pain in early pregnancy with normal, live IUP.  Assessment: [redacted]w[redacted]d weeks gestation  Pelvic pain affecting pregnancy    Plan: Discharge home in stable condition. First trimester precautions Recommend [name] for Hx spontaneous PTD Discuss possible cerclage at NOB 2/2 17 week delivery and 25 weeks delivery.  Urine culture sent Follow-up Information    Obstetrician of your choice Follow up.   Why:  Start prenatal care       [place] Follow up.   Why:  as needed in pregnancy emergencies Contact information: [address] [id number] [place] [phone]         Allergies as of 03/27/2018   No Known Allergies     Medication List    STOP taking these medications   metroNIDAZOLE 500 MG tablet Commonly known as:  FLAGYL   prenatal multivitamin Tabs tablet   traMADol 50 MG tablet Commonly known as:  ULTRAM     TAKE these medications   acetaminophen 500 MG tablet Commonly known as:  TYLENOL Take 1,000 mg by mouth every 6 (six) hours as needed for mild pain or headache.   VITAFOL GUMMIES 3.33-0.333-34.8 MG Chew Chew 2 tablets by mouth daily.       Pam Vargas, [place], [place] 03/27/2018, 7:59 PM  2/3

## 2018-03-29 LAB — CULTURE, OB URINE: SPECIAL REQUESTS: NORMAL

## 2018-04-02 ENCOUNTER — Encounter: Payer: Self-pay | Admitting: Family Medicine

## 2018-04-02 ENCOUNTER — Ambulatory Visit (INDEPENDENT_AMBULATORY_CARE_PROVIDER_SITE_OTHER): Payer: Self-pay | Admitting: Family Medicine

## 2018-04-02 ENCOUNTER — Other Ambulatory Visit (HOSPITAL_COMMUNITY)
Admission: RE | Admit: 2018-04-02 | Discharge: 2018-04-02 | Disposition: A | Discharge status: Home / Self Care | Payer: Medicaid Other | Source: Ambulatory Visit | Attending: Family Medicine | Admitting: Family Medicine

## 2018-04-02 VITALS — BP 99/46 | HR 70 | Wt 120.0 lb

## 2018-04-02 DIAGNOSIS — Z98891 History of uterine scar from previous surgery: Secondary | ICD-10-CM

## 2018-04-02 DIAGNOSIS — N898 Other specified noninflammatory disorders of vagina: Secondary | ICD-10-CM | POA: Diagnosis not present

## 2018-04-02 DIAGNOSIS — O0990 Supervision of high risk pregnancy, unspecified, unspecified trimester: Secondary | ICD-10-CM | POA: Insufficient documentation

## 2018-04-02 DIAGNOSIS — Z8751 Personal history of pre-term labor: Secondary | ICD-10-CM

## 2018-04-02 DIAGNOSIS — O09521 Supervision of elderly multigravida, first trimester: Secondary | ICD-10-CM

## 2018-04-02 DIAGNOSIS — O09529 Supervision of elderly multigravida, unspecified trimester: Secondary | ICD-10-CM | POA: Insufficient documentation

## 2018-04-02 NOTE — Progress Notes (Signed)
Subjective:  Pam Vargas is a [id number] [redacted]w[redacted]d being seen today for her first obstetrical visit.  Her obstetrical history is significant for AMA, previous preterm delivery at 25 weeks, previable PPROM at 17-18 weeks resulting in chorioamnionitis and delivery. She had a previous cesarean delivery for failure to descend with subsequent VBAC wtih 24 wk delivery.. Patient does intend to breast feed. Pregnancy history fully reviewed.  Patient reports no complaints.  BP (!) 99/46   Pulse 70   Wt 120 lb (54.4 kg)   LMP 01/29/2018 (Exact Date)   BMI 20.60 kg/m   HISTORY: OB History  Gravida Para Term Preterm AB Living  5 3 1 1 1 2   SAB TAB Ectopic Multiple Live Births  1 0 0 0 2    # Outcome Date GA Lbr Len/2nd Weight Sex Delivery Anes PTL Lv  5 Current           4 SAB 05/2017          3 Para 11/02/16 [redacted]w[redacted]d   M Vag-Spont None  FD     Complications: Premature rupture of membranes in pregnancy  2 Preterm 08/04/04 [redacted]w[redacted]d  1 lb 3.7 oz (0.558 kg) M Vag-Spont   LIV  1 Term 02/19/98 [redacted]w[redacted]d  8 lb (3.629 kg) M CS-LTranv   LIV    Past Medical History:  Diagnosis Date  . Preterm labor     Past Surgical History:  Procedure Laterality Date  . CESAREAN SECTION    . FOOT SURGERY    . FOOT SURGERY      Family History  Problem Relation Age of Onset  . Diabetes Mother   . Hyperlipidemia Mother   . Hypertension Mother   . Heart disease Father      Exam    Uterus:     Pelvic Exam:    Perineum: No Hemorrhoids, Normal Perineum   Vulva: normal, Bartholin's, Urethra, Skene's normal   Vagina:  normal mucosa   Cervix: multiparous appearance, no lesions and vaginal discharge   Adnexa: normal adnexa and no mass, fullness, tenderness   Bony Pelvis: gynecoid  System: Breast:  normal appearance, no masses or tenderness, Inspection negative, No nipple retraction or dimpling, No nipple discharge or bleeding, No axillary or supraclavicular adenopathy, Normal to palpation without dominant masses    Skin: normal coloration and turgor, no rashes    Neurologic: oriented, normal, gait normal; reflexes normal and symmetric   Extremities: normal strength, tone, and muscle mass   HEENT PERRLA and extra ocular movement intact   Mouth/Teeth mucous membranes moist, pharynx normal without lesions   Neck supple and no masses   Cardiovascular: regular rate and rhythm, no murmurs or gallops   Respiratory:  appears well, vitals normal, no respiratory distress, acyanotic, normal RR, ear and throat exam is normal, neck free of mass or lymphadenopathy, chest clear, no wheezing, crepitations, rhonchi, normal symmetric air entry   Abdomen: soft, non-tender; bowel sounds normal; no masses,  no organomegaly   Urinary: urethral meatus normal      Assessment:    Pregnancy: [id number] Patient Active Problem List   Diagnosis Date Noted  . Supervision of high risk pregnancy, antepartum 04/02/2018  . Advanced maternal age in multigravida 04/02/2018  . Bilateral breast cysts 06/20/2017  . Breast nodule 06/05/2017  . Previous cesarean section 09/12/2016  . History of preterm delivery 09/12/2016  . TOBACCO USER 11/02/2009      Plan:   1. Supervision of high risk pregnancy, antepartum PAP up  to date - [facility] AMB BABYSCRIPTS OPT IN - Culture, OB Urine - Obstetric Panel, Including HIV - SMN1 COPY NUMBER ANALYSIS (SMA Carrier Screen)  2. Previous cesarean section Would like to VBAC  3. History of preterm delivery Discussed Makena - pt and FOB uncertain of whether they want to do the Makena, especially after having PPROM and loss with last pregnancy after 2 doses of Makena.   45. Multigravida of advanced maternal age in first trimester Discussed Panorama - will get at next appt  5. Vaginal discharge  - Cervicovaginal ancillary only     Problem list reviewed and updated. 75% of 45 min visit spent on counseling and coordination of care.     Truett Mainland 04/02/2018

## 2018-04-02 NOTE — Progress Notes (Signed)
Bedside ultrasound performed for FHR, FHR 176bpm.

## 2018-04-03 LAB — CERVICOVAGINAL ANCILLARY ONLY
Bacterial vaginitis: NEGATIVE
Candida vaginitis: NEGATIVE
Chlamydia: NEGATIVE
Neisseria Gonorrhea: NEGATIVE
TRICH (WINDOWPATH): NEGATIVE

## 2018-04-04 LAB — CULTURE, OB URINE

## 2018-04-04 LAB — URINE CULTURE, OB REFLEX

## 2018-04-07 LAB — SMN1 COPY NUMBER ANALYSIS (SMA CARRIER SCREENING)

## 2018-04-07 LAB — OBSTETRIC PANEL, INCLUDING HIV
Antibody Screen: NEGATIVE
Basophils Absolute: 0 10*3/uL (ref 0.0–0.2)
Basos: 0 %
EOS (ABSOLUTE): 0.1 10*3/uL (ref 0.0–0.4)
EOS: 2 %
HEMOGLOBIN: 11.9 g/dL (ref 11.1–15.9)
HIV Screen 4th Generation wRfx: NONREACTIVE
Hematocrit: 36.7 % (ref 34.0–46.6)
Hepatitis B Surface Ag: NEGATIVE
IMMATURE GRANS (ABS): 0 10*3/uL (ref 0.0–0.1)
IMMATURE GRANULOCYTES: 0 %
LYMPHS ABS: 2.2 10*3/uL (ref 0.7–3.1)
LYMPHS: 31 %
MCH: 26.9 pg (ref 26.6–33.0)
MCHC: 32.4 g/dL (ref 31.5–35.7)
MCV: 83 fL (ref 79–97)
MONOS ABS: 0.5 10*3/uL (ref 0.1–0.9)
Monocytes: 6 %
NEUTROS PCT: 61 %
Neutrophils Absolute: 4.4 10*3/uL (ref 1.4–7.0)
PLATELETS: 218 10*3/uL (ref 150–450)
RBC: 4.43 x10E6/uL (ref 3.77–5.28)
RDW: 14.9 % (ref 12.3–15.4)
RH TYPE: POSITIVE
RPR Ser Ql: NONREACTIVE
Rubella Antibodies, IGG: 2.47 index (ref >0.99)
WBC: 7.2 10*3/uL (ref 3.4–10.8)

## 2018-04-12 ENCOUNTER — Telehealth: Payer: Self-pay | Admitting: General Practice

## 2018-04-12 NOTE — Telephone Encounter (Signed)
Patient called and left message requesting results.

## 2018-04-20 ENCOUNTER — Encounter (HOSPITAL_COMMUNITY): Payer: Self-pay | Admitting: *Deleted

## 2018-04-20 ENCOUNTER — Other Ambulatory Visit: Payer: Self-pay

## 2018-04-20 ENCOUNTER — Inpatient Hospital Stay (HOSPITAL_COMMUNITY): Payer: Medicaid Other

## 2018-04-20 ENCOUNTER — Inpatient Hospital Stay (HOSPITAL_COMMUNITY)
Admission: AD | Admit: 2018-04-20 | Discharge: 2018-04-20 | Disposition: A | Discharge status: Home / Self Care | Payer: Medicaid Other | Source: Ambulatory Visit | Attending: Obstetrics & Gynecology | Admitting: Obstetrics & Gynecology

## 2018-04-20 DIAGNOSIS — Z6790 Unspecified blood type, Rh positive: Secondary | ICD-10-CM

## 2018-04-20 DIAGNOSIS — Z87891 Personal history of nicotine dependence: Secondary | ICD-10-CM | POA: Insufficient documentation

## 2018-04-20 DIAGNOSIS — O021 Missed abortion: Secondary | ICD-10-CM

## 2018-04-20 DIAGNOSIS — O209 Hemorrhage in early pregnancy, unspecified: Secondary | ICD-10-CM | POA: Diagnosis present

## 2018-04-20 DIAGNOSIS — Z3A11 11 weeks gestation of pregnancy: Secondary | ICD-10-CM | POA: Insufficient documentation

## 2018-04-20 DIAGNOSIS — Z79899 Other long term (current) drug therapy: Secondary | ICD-10-CM | POA: Insufficient documentation

## 2018-04-20 DIAGNOSIS — O4690 Antepartum hemorrhage, unspecified, unspecified trimester: Secondary | ICD-10-CM

## 2018-04-20 LAB — CBC
HCT: 36 % (ref 36.0–46.0)
HEMOGLOBIN: 11.9 g/dL — AB (ref 12.0–15.0)
MCH: 26.9 pg (ref 26.0–34.0)
MCHC: 33.1 g/dL (ref 30.0–36.0)
MCV: 81.3 fL (ref 78.0–100.0)
Platelets: 209 10*3/uL (ref 150–400)
RBC: 4.43 MIL/uL (ref 3.87–5.11)
RDW: 13.8 % (ref 11.5–15.5)
WBC: 7.3 10*3/uL (ref 4.0–10.5)

## 2018-04-20 LAB — URINALYSIS, ROUTINE W REFLEX MICROSCOPIC
Bilirubin Urine: NEGATIVE
GLUCOSE, UA: NEGATIVE mg/dL
Ketones, ur: NEGATIVE mg/dL
Nitrite: NEGATIVE
PH: 9 — AB (ref 5.0–8.0)
PROTEIN: NEGATIVE mg/dL
Specific Gravity, Urine: 1.002 — ABNORMAL LOW (ref 1.005–1.030)

## 2018-04-20 LAB — WET PREP, GENITAL
Clue Cells Wet Prep HPF POC: NONE SEEN
SPERM: NONE SEEN
Trich, Wet Prep: NONE SEEN
Yeast Wet Prep HPF POC: NONE SEEN

## 2018-04-20 MED ORDER — OXYCODONE-ACETAMINOPHEN 5-325 MG PO TABS: 1.0000 | ORAL_TABLET | ORAL | 0 refills | Status: DC | PRN

## 2018-04-20 MED ORDER — MISOPROSTOL 200 MCG PO TABS
800.0000 ug | ORAL_TABLET | Freq: Once | ORAL | Status: AC
Start: 2018-04-20 — End: 2018-04-20
  Administered 2018-04-20: 800 ug via ORAL
  Filled 2018-04-20: qty 4

## 2018-04-20 NOTE — MAU Provider Note (Addendum)
History     CSN: [id number]  Arrival date and time: 04/20/18 1205  Chief Complaint  Patient presents with  . Vaginal Discharge   HPI Pam Vargas is 37 y.o. [id number] [redacted]w[redacted]d weeks presenting for evaluation of a dark brown vaginal discharge.  Began 6/16, describes as spotting some days lighter than others, using only minipads.  Neg for pain. Vaginal itching.  Had viability U/S 5/28.  No [place] seen. She is a patient in the clinic. STD screening on 6/3 all negative.  She has not been sexually active since spotting.    Past Medical History:  Diagnosis Date  . Preterm labor     Past Surgical History:  Procedure Laterality Date  . CESAREAN SECTION    . FOOT SURGERY    . FOOT SURGERY      Family History  Problem Relation Age of Onset  . Diabetes Mother   . Hyperlipidemia Mother   . Hypertension Mother   . Heart disease Father     Social History   Tobacco Use  . Smoking status: Former Smoker    Packs/day: 0.30    Types: Cigarettes    Last attempt to quit: 03/27/2018    Years since quitting: 0.0  . Smokeless tobacco: Never Used  Substance Use Topics  . Alcohol use: No  . Drug use: No    Allergies: No Known Allergies  Medications Prior to Admission  Medication Sig Dispense Refill Last Dose  . acetaminophen (TYLENOL) 500 MG tablet Take 1,000 mg by mouth every 6 (six) hours as needed for mild pain or headache.   Not Taking  . Prenatal Vit-Fe Fumarate-FA (MULTIVITAMIN-PRENATAL) 27-0.8 MG TABS tablet Take 1 tablet by mouth daily at 12 noon.   Taking    Review of Systems  Constitutional: Negative for appetite change and fever.  Respiratory: Negative for shortness of breath.   Cardiovascular: Negative for chest pain.  Gastrointestinal: Negative for abdominal pain.  Genitourinary: Positive for vaginal bleeding (dark brown spotting) and vaginal discharge. Negative for dysuria, frequency, hematuria and pelvic pain.       + itching.   Physical Exam   Blood pressure 110/67,  pulse 90, temperature 98.5 F (36.9 C), temperature source Oral, resp. rate 16, height 5\' 4"  (1.626 m), weight 120 lb (54.4 kg), last menstrual period 01/29/2018, unknown if currently breastfeeding.  Physical Exam  Constitutional: She is oriented to person, place, and time. She appears well-developed and well-nourished. No distress.  HENT:  Head: Normocephalic.  Neck: Normal range of motion.  Cardiovascular: Normal rate.  Respiratory: Effort normal.  GI: Soft. She exhibits no distension and no mass. There is no tenderness. There is no rebound and no guarding.  Genitourinary: There is no rash, tenderness or lesion on the right labia. There is no rash, tenderness or lesion on the left labia. Uterus is enlarged (11=12 week size). Uterus is not tender. Cervix exhibits discharge. Cervix exhibits no motion tenderness and no friability. Right adnexum displays no mass, no tenderness and no fullness. Left adnexum displays no mass and no tenderness. No bleeding (for dark brown blood in the canal.  neg for active bleeding. ) in the vagina. Vaginal discharge found.  Neurological: She is alert and oriented to person, place, and time.  Skin: Skin is warm and dry.  Psychiatric: She has a normal mood and affect. Her behavior is normal. Thought content normal.   Results for orders placed or performed during the hospital encounter of 04/20/18 (from the past 24 hour(s))  Urinalysis, Routine w reflex microscopic     Status: Abnormal   Collection Time: 04/20/18 12:47 PM  Result Value Ref Range   Color, Urine STRAW (A) YELLOW   APPearance CLEAR CLEAR   Specific Gravity, Urine 1.002 (L) 1.005 - 1.030   pH 9.0 (H) 5.0 - 8.0   Glucose, UA NEGATIVE NEGATIVE mg/dL   Hgb urine dipstick MODERATE (A) NEGATIVE   Bilirubin Urine NEGATIVE NEGATIVE   Ketones, ur NEGATIVE NEGATIVE mg/dL   Protein, ur NEGATIVE NEGATIVE mg/dL   Nitrite NEGATIVE NEGATIVE   Leukocytes, UA SMALL (A) NEGATIVE   RBC / HPF 0-5 0 - 5 RBC/hpf    WBC, UA 6-10 0 - 5 WBC/hpf   Bacteria, UA RARE (A) NONE SEEN   Squamous Epithelial / LPF 0-5 0 - 5    BLOOD TYPE O positive from previous record.  US Ob Comp Less 14 Wks  Result Date: 04/20/2018 CLINICAL DATA:  Vaginal bleeding in pregnancy. Current assigned gestational age of [redacted] weeks 4 days. EXAM: OBSTETRIC <14 WK ULTRASOUND TECHNIQUE: Transabdominal ultrasound was performed for evaluation of the gestation as well as the maternal uterus and adnexal regions. COMPARISON:  03/27/2018 FINDINGS: Intrauterine gestational sac: Single Yolk sac:  Not Visualized. Embryo:  Visualized. Cardiac Activity: None visualized. CRL:   28 mm   9 w 4 d                  [place] EDC: 11/19/2018 Subchorionic hemorrhage:  None visualized. Maternal uterus/adnexae: Both ovaries are normal appearance. No mass or abnormal free fluid identified. IMPRESSION: Findings meet definitive criteria for failed IUP. This follows SRU consensus guidelines: Diagnostic Criteria for Nonviable Pregnancy Early in the First Trimester. Alison Stalling J Med [phone]. Electronically Signed   By: Earle Gell M.D.   On: 04/20/2018 16:22   MAU Course  Procedures  MDM MSE Exam Labs Bedside U/S 14:40 care turned over to Julianne Handler, CNM   Bedside US shows fetus measuring [redacted]w[redacted]d and no cardiac activity. Will order formal US. MAB identified on formal US. Consult with Dr. Harolyn Rutherford, ok to offer medical mngt. Discussed [place] results with pt. Support offered. Options for mngt discussed. Pt prefers medical mngt. She cannot afford all meds, but has $7, will Rx Percocet for pain. Good Rx coupon provided  Early Intrauterine Pregnancy Failure Protocol X  Documented intrauterine pregnancy failure  X  No serious current illness  X  Baseline Hgb greater than or equal to 10g/dl  X  Patient has easily accessible transportation to the hospital  X  Clear preference  X  Practitioner/physician deems patient reliable  X  Counseling by practitioner or physician  X   Patient education by RN       Rho-Gam given by RN if indicated  X  Medication dispensed  X  Cytotec 800 mcg po _Ibuprofen 600 mg 1 tablet by mouth every 6 hours as needed  X   Percocet 5/325 1-2 tabs every 6 hours as needed - prescribed  _Phenergan 25 mg by mouth every 6 hours as needed for nausea  Reviewed with pt cytotec procedure.  Pt verbalizes that she lives close to the hospital and has transportation readily available.  Pt appears reliable and verbalizes understanding and agrees with plan of care.  Assessment and Plan   1. Missed abortion   2. Vaginal bleeding in pregnancy   3. Blood type, Rh positive    Discharge home Follow up in [place] in 1 week- message sent to pool  Bleeding/return precautions Rx Percocet  Allergies as of 04/20/2018   No Known Allergies     Medication List    TAKE these medications   acetaminophen 500 MG tablet Commonly known as:  TYLENOL Take 1,000 mg by mouth every 6 (six) hours as needed for mild pain or headache.   oxyCODONE-acetaminophen 5-325 MG tablet Commonly known as:  PERCOCET/ROXICET Take 1 tablet by mouth every 4 (four) hours as needed for severe pain.   VITAFOL GUMMIES 3.33-0.333-34.8 MG Chew Chew 1 tablet by mouth 3 (three) times daily.      Julianne Handler, CNM  04/20/2018 6:31 PM

## 2018-04-20 NOTE — MAU Note (Signed)
Unable to obtain FHR in triage.

## 2018-04-20 NOTE — Discharge Instructions (Signed)

## 2018-04-20 NOTE — MAU Note (Signed)
Pt C/O brown discharge since June 16.  Has occasional itching.  Denies bright red bleeding or pain.

## 2018-04-23 ENCOUNTER — Telehealth: Payer: Self-pay | Admitting: General Practice

## 2018-04-23 NOTE — Telephone Encounter (Signed)
Called patient to advised her to keep appointment that is scheduled for 04/30/18 at 1:15pm.  Patient was originally scheduled for f/u [facility], however patient miscarried.  Also, notified patient of lab visit on 04/25/18 at 1:30pm.  Patient verbalized understanding.

## 2018-04-24 ENCOUNTER — Other Ambulatory Visit: Payer: Self-pay | Admitting: *Deleted

## 2018-04-24 DIAGNOSIS — O3680X0 Pregnancy with inconclusive fetal viability, not applicable or unspecified: Secondary | ICD-10-CM

## 2018-04-24 NOTE — Telephone Encounter (Signed)
Spoke with patient.  She has already had her questions answered.

## 2018-04-25 ENCOUNTER — Encounter (HOSPITAL_COMMUNITY): Payer: Self-pay

## 2018-04-25 ENCOUNTER — Inpatient Hospital Stay (HOSPITAL_COMMUNITY): Payer: Medicaid Other

## 2018-04-25 ENCOUNTER — Other Ambulatory Visit: Payer: Self-pay

## 2018-04-25 ENCOUNTER — Inpatient Hospital Stay (HOSPITAL_COMMUNITY)
Admission: AD | Admit: 2018-04-25 | Discharge: 2018-04-26 | Disposition: A | Discharge status: Home / Self Care | Payer: Medicaid Other | Source: Ambulatory Visit | Attending: Obstetrics & Gynecology | Admitting: Obstetrics & Gynecology

## 2018-04-25 DIAGNOSIS — Z3A12 12 weeks gestation of pregnancy: Secondary | ICD-10-CM | POA: Diagnosis not present

## 2018-04-25 DIAGNOSIS — R102 Pelvic and perineal pain: Secondary | ICD-10-CM

## 2018-04-25 DIAGNOSIS — O034 Incomplete spontaneous abortion without complication: Secondary | ICD-10-CM | POA: Diagnosis not present

## 2018-04-25 DIAGNOSIS — Z87891 Personal history of nicotine dependence: Secondary | ICD-10-CM | POA: Diagnosis not present

## 2018-04-25 DIAGNOSIS — O26899 Other specified pregnancy related conditions, unspecified trimester: Secondary | ICD-10-CM

## 2018-04-25 DIAGNOSIS — R109 Unspecified abdominal pain: Secondary | ICD-10-CM | POA: Diagnosis present

## 2018-04-25 DIAGNOSIS — O3680X0 Pregnancy with inconclusive fetal viability, not applicable or unspecified: Secondary | ICD-10-CM

## 2018-04-25 LAB — URINALYSIS, ROUTINE W REFLEX MICROSCOPIC
Bilirubin Urine: NEGATIVE
Glucose, UA: NEGATIVE mg/dL
KETONES UR: NEGATIVE mg/dL
Nitrite: NEGATIVE
PROTEIN: 30 mg/dL — AB
Specific Gravity, Urine: 1.005 (ref 1.005–1.030)
WBC, UA: >50 WBC/hpf — ABNORMAL HIGH (ref 0–5)
pH: 7 (ref 5.0–8.0)

## 2018-04-25 LAB — POCT PREGNANCY, URINE: Preg Test, Ur: POSITIVE — AB

## 2018-04-25 MED ORDER — MISOPROSTOL 200 MCG PO TABS
800.0000 ug | ORAL_TABLET | Freq: Once | ORAL | Status: AC
  Administered 2018-04-25: 800 ug via ORAL
  Filled 2018-04-25: qty 4

## 2018-04-25 MED ORDER — OXYCODONE-ACETAMINOPHEN 5-325 MG PO TABS
1.0000 | ORAL_TABLET | Freq: Once | ORAL | Status: AC
  Administered 2018-04-25: 1 via ORAL
  Filled 2018-04-25: qty 1

## 2018-04-25 NOTE — MAU Note (Signed)
Abdominal cramping and pressure that started a couple days ago, occurs intermittently.  Also having malodorous discharge.  Was seen for MAB on 6/21.  Given cytotec-passed a large clot afterwards and felt better.  Finished her percocet prescription.  Has passed some small blood clots since then along with her bleeding.  Bleeding was lightening up until yesterday.  Has not seen any tissue pass.  Took tylenol at 0200 last night-no relief.

## 2018-04-25 NOTE — MAU Provider Note (Signed)
Chief Complaint:  Abdominal Pain   First Provider Initiated Contact with Patient 04/25/18 2159      HPI: Pam Vargas is a 37 y.o. [id number] who presents to maternity admissions reporting cramping and heavy bleeding with an odor to it, s/p missed abortion treated with Cytotec.  Did get Cytotec for a missed abortion and passed some tissue. Bleeding got heavier today, as did the cramping. She reports vaginal bleeding, but no vaginal itching/burning, urinary symptoms, h/a, dizziness, n/v, or fever/chills.    Abdominal Pain  This is a recurrent problem. The current episode started today. The onset quality is gradual. The problem occurs intermittently. The problem has been unchanged. The pain is located in the suprapubic region, LLQ and RLQ. The pain is moderate. The quality of the pain is cramping and sharp. The abdominal pain does not radiate. Pertinent negatives include no constipation, diarrhea, dysuria, fever, headaches, nausea or vomiting. Nothing aggravates the pain. The pain is relieved by nothing. She has tried oral narcotic analgesics for the symptoms. The treatment provided mild relief.   RN note: Abdominal cramping and pressure that started a couple days ago, occurs intermittently.  Also having malodorous discharge.  Was seen for MAB on 6/21.  Given cytotec-passed a large clot afterwards and felt better.  Finished her percocet prescription.  Has passed some small blood clots since then along with her bleeding.  Bleeding was lightening up until yesterday.  Has not seen any tissue pass.  Took tylenol at 0200 last night-no relief.      Past Medical History: Past Medical History:  Diagnosis Date  . Preterm labor     Past obstetric history: OB History  Gravida Para Term Preterm AB Living  5 3 1 1 1 2   SAB TAB Ectopic Multiple Live Births  1 0 0 0 2    # Outcome Date GA Lbr Len/2nd Weight Sex Delivery Anes PTL Lv  5 Current           4 SAB 05/2017          3 Para 11/02/16 [redacted]w[redacted]d   M  Vag-Spont None  FD     Complications: Premature rupture of membranes in pregnancy  2 Preterm 08/04/04 [redacted]w[redacted]d  1 lb 3.7 oz (0.558 kg) M Vag-Spont   LIV  1 Term 02/19/98 [redacted]w[redacted]d  8 lb (3.629 kg) M CS-LTranv   LIV    Past Surgical History: Past Surgical History:  Procedure Laterality Date  . CESAREAN SECTION    . FOOT SURGERY    . FOOT SURGERY      Family History: Family History  Problem Relation Age of Onset  . Diabetes Mother   . Hyperlipidemia Mother   . Hypertension Mother   . Heart disease Father     Social History: Social History   Tobacco Use  . Smoking status: Former Smoker    Packs/day: 0.30    Types: Cigarettes    Last attempt to quit: 03/27/2018    Years since quitting: 0.0  . Smokeless tobacco: Never Used  Substance Use Topics  . Alcohol use: Yes    Comment: last drink 04/21/2018  . Drug use: No    Allergies: No Known Allergies  Meds:  Medications Prior to Admission  Medication Sig Dispense Refill Last Dose  . acetaminophen (TYLENOL) 500 MG tablet Take 1,000 mg by mouth every 6 (six) hours as needed for mild pain or headache.   prn  . oxyCODONE-acetaminophen (PERCOCET/ROXICET) 5-325 MG tablet Take 1 tablet by  mouth every 4 (four) hours as needed for severe pain. 12 tablet 0   . Prenatal Vit-Fe Phos-FA-Omega (VITAFOL GUMMIES) 3.33-0.333-34.8 MG CHEW Chew 1 tablet by mouth 3 (three) times daily.   6 04/20/2018 at Unknown time    I have reviewed patient's Past Medical Hx, Surgical Hx, Family Hx, Social Hx, medications and allergies.  ROS:  Review of Systems  Constitutional: Negative for fever.  Gastrointestinal: Positive for abdominal pain. Negative for constipation, diarrhea, nausea and vomiting.  Genitourinary: Negative for dysuria.  Neurological: Negative for headaches.   Other systems negative     Physical Exam   Patient Vitals for the past 24 hrs:  BP Temp Pulse Resp Height Weight  04/25/18 2134 112/71 98.4 F (36.9 C) 89 17 - -  04/25/18 2125  - - - - 5\' 4"  (1.626 m) 120 lb (54.4 kg)   Constitutional: Well-developed, well-nourished female in no acute distress.  Cardiovascular: normal rate and rhythm, no ectopy audible, S1 & S2 heard, no murmur Respiratory: normal effort, no distress. Lungs CTAB with no wheezes or crackles GI: Abd soft, non-tender.  Nondistended.  No rebound, No guarding.  Bowel Sounds audible  MS: Extremities nontender, no edema, normal ROM Neurologic: Alert and oriented x 4.   Grossly nonfocal. GU: Neg CVAT. Skin:  Warm and Dry Psych:  Affect appropriate.  PELVIC EXAM: Large mass of tissue in vault, removed.  Foul odor to it.  Multiple other clots.  Sent to path    Labs: Results for orders placed or performed during the hospital encounter of 04/25/18 (from the past 24 hour(s))  Urinalysis, Routine w reflex microscopic     Status: Abnormal   Collection Time: 04/25/18  9:45 PM  Result Value Ref Range   Color, Urine AMBER (A) YELLOW   APPearance CLEAR CLEAR   Specific Gravity, Urine 1.005 1.005 - 1.030   pH 7.0 5.0 - 8.0   Glucose, UA NEGATIVE NEGATIVE mg/dL   Hgb urine dipstick LARGE (A) NEGATIVE   Bilirubin Urine NEGATIVE NEGATIVE   Ketones, ur NEGATIVE NEGATIVE mg/dL   Protein, ur 30 (A) NEGATIVE mg/dL   Nitrite NEGATIVE NEGATIVE   Leukocytes, UA SMALL (A) NEGATIVE   RBC / HPF >50 (H) 0 - 5 RBC/hpf   WBC, UA >50 (H) 0 - 5 WBC/hpf   Bacteria, UA FEW (A) NONE SEEN   Squamous Epithelial / LPF 0-5 0 - 5  Pregnancy, urine POC     Status: Abnormal   Collection Time: 04/25/18  9:48 PM  Result Value Ref Range   Preg Test, Ur POSITIVE (A) NEGATIVE    O/Positive/-- (06/03 1618)  Imaging:  US Ob Comp Less 14 Wks  Result Date: 04/20/2018 CLINICAL DATA:  Vaginal bleeding in pregnancy. Current assigned gestational age of [redacted] weeks 4 days. EXAM: OBSTETRIC <14 WK ULTRASOUND TECHNIQUE: Transabdominal ultrasound was performed for evaluation of the gestation as well as the maternal uterus and adnexal regions.  COMPARISON:  03/27/2018 FINDINGS: Intrauterine gestational sac: Single Yolk sac:  Not Visualized. Embryo:  Visualized. Cardiac Activity: None visualized. CRL:   28 mm   9 w 4 d                  [place] EDC: 11/19/2018 Subchorionic hemorrhage:  None visualized. Maternal uterus/adnexae: Both ovaries are normal appearance. No mass or abnormal free fluid identified. IMPRESSION: Findings meet definitive criteria for failed IUP. This follows SRU consensus guidelines: Diagnostic Criteria for Nonviable Pregnancy Early in the First Trimester. N Engl  J Med [phone]. Electronically Signed   By: Earle Gell M.D.   On: 04/20/2018 16:22   US Ob Transvaginal  Result Date: 04/25/2018 CLINICAL DATA:  37 year old pregnant female status post cytotec administration 04/20/2018 for nonviable intrauterine gestation. Patient presents with cramping and bleeding. EXAM: TRANSVAGINAL OB ULTRASOUND TECHNIQUE: Transvaginal ultrasound was performed for complete evaluation of the gestation as well as the maternal uterus, adnexal regions, and pelvic cul-de-sac. COMPARISON:  04/20/2018 obstetric scan. FINDINGS: Slightly retroverted uterus. No uterine fibroids. Thickened (25 mm) heterogeneous endometrium with internal vascularity. No focal endometrial mass. No residual intrauterine gestational sac. Right ovary measures 3.5 x 1.2 x 2.1 cm. Left ovary not visualized. No adnexal masses. No abnormal free fluid in the pelvis. IMPRESSION: 1. Thickened (25 mm) heterogeneous endometrium with internal vascularity, compatible with retained products of conception. No focal endometrial mass. No residual intrauterine gestational sac. 2. No adnexal masses. Electronically Signed   By: Ilona Sorrel M.D.   On: 04/25/2018 23:21   US Ob Transvaginal  Result Date: 03/27/2018 CLINICAL DATA:  Initial evaluation for early pregnancy, unknown location on previous ultrasound. Dysuria. EXAM: OBSTETRIC <14 WK [place] AND TRANSVAGINAL OB US TECHNIQUE: Both transabdominal  and transvaginal ultrasound examinations were performed for complete evaluation of the gestation as well as the maternal uterus, adnexal regions, and pelvic cul-de-sac. Transvaginal technique was performed to assess early pregnancy. COMPARISON:  Prior ultrasound from 02/25/2018. FINDINGS: Intrauterine gestational sac: Single Yolk sac:  Present Embryo:  Present Cardiac Activity: Present Heart Rate: 161 bpm CRL: 17.8 mm   8 w   1 d                  [place] EDC: 11/05/2018 Subchorionic hemorrhage:  None visualized. Maternal uterus/adnexae: Ovaries normal in appearance bilaterally. No free fluid within the pelvis. IMPRESSION: 1. Single live intrauterine pregnancy as above without complication, estimated gestational age [redacted] weeks and 1 day by crown-rump length. 2. No other acute maternal uterine or adnexal abnormality identified. Electronically Signed   By: Jeannine Boga M.D.   On: 03/27/2018 19:49    MAU Course/MDM: I have ordered labs as follows:  UA Imaging ordered: UA which showed decidua left behind. Per Dr Elonda Husky, treat with two doses of Cytotec Results reviewed.    Treatments in MAU included First dose of CYtotec 814mcg.  WIll order second dose for tomorrow.  Will order Percocet for pain.   Pt stable at time of discharge.  Assessment: Pregnancy at [redacted]w[redacted]d Missed abortion, incomplete  Plan: Discharge home Recommend Take second dose of cytotec tomorrow.  Rx sent for Cytotec for missed abortion Rx sent for Percocet #6 tabs for pain Bleeding precautions Followup in clinic as already scheduled  Encouraged to return here or to other Urgent Care/ED if she develops worsening of symptoms, increase in pain, fever, or other concerning symptoms.   Hansel Feinstein CNM, MSN Certified Nurse-Midwife 04/25/2018 10:00 PM

## 2018-04-26 LAB — BETA HCG QUANT (REF LAB): hCG Quant: 2011 m[IU]/mL

## 2018-04-26 MED ORDER — MISOPROSTOL 200 MCG PO TABS: ORAL_TABLET | ORAL | 0 refills | Status: DC

## 2018-04-26 MED ORDER — OXYCODONE-ACETAMINOPHEN 5-325 MG PO TABS: 1.0000 | ORAL_TABLET | Freq: Four times a day (QID) | ORAL | 0 refills | Status: DC | PRN

## 2018-04-26 NOTE — Discharge Instructions (Signed)

## 2018-04-30 ENCOUNTER — Ambulatory Visit (INDEPENDENT_AMBULATORY_CARE_PROVIDER_SITE_OTHER): Payer: Medicaid Other | Admitting: Family Medicine

## 2018-04-30 ENCOUNTER — Encounter: Payer: Self-pay | Admitting: Family Medicine

## 2018-04-30 VITALS — BP 105/75 | HR 73 | Ht 64.0 in | Wt 119.0 lb

## 2018-04-30 DIAGNOSIS — O034 Incomplete spontaneous abortion without complication: Secondary | ICD-10-CM

## 2018-04-30 NOTE — Progress Notes (Signed)
   Subjective:    Patient ID: SHARIYA GASTER, female    DOB: 03/24/1981, 37 y.o.   MRN: [id number]  HPI Patient seen for incomplete AB. Was seen in MAU on 6/26, US showed thickened endometrium and quant was 2000. Has continued to have some intermittent light bleeding. Has not passed any more tissue.   Review of Systems     Objective:   Physical Exam  Constitutional: She is oriented to person, place, and time. She appears well-developed and well-nourished.  HENT:  Head: Normocephalic and atraumatic.  Cardiovascular: Normal rate, regular rhythm and normal heart sounds. Exam reveals no gallop and no friction rub.  No murmur heard. Pulmonary/Chest: Breath sounds normal. No stridor. No respiratory distress. She has no wheezes. She has no rales.  Abdominal: Soft. She exhibits no distension and no mass. There is no tenderness. There is no guarding.  Neurological: She is alert and oriented to person, place, and time.  Skin: Skin is warm and dry.      Assessment & Plan:  1. Incomplete miscarriage Recheck quant. Will follow. Pt grieving appropriately. - B-HCG Quant

## 2018-05-01 LAB — BETA HCG QUANT (REF LAB): HCG QUANT: 420 m[IU]/mL

## 2018-05-09 ENCOUNTER — Encounter

## 2018-06-29 ENCOUNTER — Ambulatory Visit (HOSPITAL_COMMUNITY)
Admission: EM | Admit: 2018-06-29 | Discharge: 2018-06-29 | Disposition: A | Discharge status: Home / Self Care | Payer: Medicaid Other | Attending: Internal Medicine | Admitting: Internal Medicine

## 2018-06-29 ENCOUNTER — Encounter (HOSPITAL_COMMUNITY): Payer: Self-pay

## 2018-06-29 DIAGNOSIS — Z87891 Personal history of nicotine dependence: Secondary | ICD-10-CM | POA: Insufficient documentation

## 2018-06-29 DIAGNOSIS — N898 Other specified noninflammatory disorders of vagina: Secondary | ICD-10-CM | POA: Insufficient documentation

## 2018-06-29 DIAGNOSIS — N949 Unspecified condition associated with female genital organs and menstrual cycle: Secondary | ICD-10-CM

## 2018-06-29 DIAGNOSIS — Z833 Family history of diabetes mellitus: Secondary | ICD-10-CM | POA: Insufficient documentation

## 2018-06-29 DIAGNOSIS — Z8249 Family history of ischemic heart disease and other diseases of the circulatory system: Secondary | ICD-10-CM | POA: Diagnosis not present

## 2018-06-29 DIAGNOSIS — Z9889 Other specified postprocedural states: Secondary | ICD-10-CM | POA: Insufficient documentation

## 2018-06-29 DIAGNOSIS — Z79899 Other long term (current) drug therapy: Secondary | ICD-10-CM | POA: Insufficient documentation

## 2018-06-29 LAB — POCT URINALYSIS DIP (DEVICE)
BILIRUBIN URINE: NEGATIVE
Glucose, UA: NEGATIVE mg/dL
KETONES UR: NEGATIVE mg/dL
LEUKOCYTES UA: NEGATIVE
Nitrite: NEGATIVE
PH: 7 (ref 5.0–8.0)
Protein, ur: NEGATIVE mg/dL
Specific Gravity, Urine: 1.01 (ref 1.005–1.030)
Urobilinogen, UA: 0.2 mg/dL (ref 0.0–1.0)

## 2018-06-29 LAB — POCT PREGNANCY, URINE: Preg Test, Ur: NEGATIVE

## 2018-06-29 MED ORDER — METRONIDAZOLE 500 MG PO TABS: 500.0000 mg | ORAL_TABLET | Freq: Two times a day (BID) | ORAL | 0 refills | Status: DC

## 2018-06-29 MED ORDER — FLUCONAZOLE 200 MG PO TABS: ORAL_TABLET | ORAL | 0 refills | Status: DC

## 2018-06-29 NOTE — ED Triage Notes (Signed)
Pt presents with vaginal pain; no complaints of discharge, discomfort when urination or foul odor.

## 2018-06-29 NOTE — ED Provider Notes (Signed)
[facility]   [id number] 06/29/18 Arrival Time: 7510   CH:ENIDPOE discomfort  SUBJECTIVE:  Pam Vargas is a 37 y.o. female who presents with complaints of vaginal discomfort x5 days ago.  Symptoms began while having sex.  Patient is sexually active with 1 female partner.  Partner without symptoms.  No concern for STIs today, reports monogamous relationship for 16 years.  Denies vaginal discharge.  She denies alleviating factors.  She reports worsening symptoms with sexual activity.  She reports similar symptoms in the past and was diagnosed with BV or yeast.  Complains of associated vaginal odor.  She denies fever, chills, nausea, vomiting, abdominal or pelvic pain, urinary symptoms, vaginal itching, vaginal bleeding, vaginal rashes or lesions.   Patient's last menstrual period was 06/14/2018.  ROS: As per HPI.  Past Medical History:  Diagnosis Date  . Preterm labor    Past Surgical History:  Procedure Laterality Date  . CESAREAN SECTION    . FOOT SURGERY    . FOOT SURGERY     No Known Allergies No current facility-administered medications on file prior to encounter.    Current Outpatient Medications on File Prior to Encounter  Medication Sig Dispense Refill  . misoprostol (CYTOTEC) 200 MCG tablet Take 4 tablets all at once 24 hours after last dose 4 tablet 0  . oxyCODONE-acetaminophen (PERCOCET/ROXICET) 5-325 MG tablet Take 1-2 tablets by mouth every 6 (six) hours as needed. (Patient not taking: Reported on 04/30/2018) 6 tablet 0  . Prenatal Vit-Fe Phos-FA-Omega (VITAFOL GUMMIES) 3.33-0.333-34.8 MG CHEW Chew 1 tablet by mouth 3 (three) times daily.   6    Social History   Socioeconomic History  . Marital status: Single    Spouse name: Not on file  . Number of children: Not on file  . Years of education: Not on file  . Highest education level: Not on file  Occupational History  . Not on file  Social Needs  . Financial resource strain: Not on file  . Food  insecurity:    Worry: Not on file    Inability: Not on file  . Transportation needs:    Medical: Not on file    Non-medical: Not on file  Tobacco Use  . Smoking status: Former Smoker    Packs/day: 0.30    Types: Cigarettes    Last attempt to quit: 03/27/2018    Years since quitting: 0.2  . Smokeless tobacco: Never Used  Substance and Sexual Activity  . Alcohol use: Yes    Comment: last drink 04/21/2018  . Drug use: No  . Sexual activity: Not Currently    Partners: Male    Birth control/protection: None  Lifestyle  . Physical activity:    Days per week: Not on file    Minutes per session: Not on file  . Stress: Not on file  Relationships  . Social connections:    Talks on phone: Not on file    Gets together: Not on file    Attends religious service: Not on file    Active member of club or organization: Not on file    Attends meetings of clubs or organizations: Not on file    Relationship status: Not on file  . Intimate partner violence:    Fear of current or ex partner: Not on file    Emotionally abused: Not on file    Physically abused: Not on file    Forced sexual activity: Not on file  Other Topics Concern  .  Not on file  Social History Narrative   Works at [employer] as a Chartered certified accountant    Family History  Problem Relation Age of Onset  . Diabetes Mother   . Hyperlipidemia Mother   . Hypertension Mother   . Heart disease Father     OBJECTIVE:  Vitals:   06/29/18 1503  BP: 112/68  Pulse: 75  Resp: 19  Temp: 98.1 F (36.7 C)  TempSrc: Oral  SpO2: 95%     General appearance: alert, cooperative, appears stated age and no distress Throat: lips, mucosa, and tongue normal; teeth and gums normal Lungs: CTA bilaterally without adventitious breath sounds Heart: regular rate and rhythm.  Radial pulses 2+ symmetrical bilaterally Back: no CVA tenderness Abdomen: soft, non-tender; bowel sounds normal; no guarding  GU: declines chaperone: external examination  without vulvar lesions or erythema Bimanual exam: Negative for cervical motion or adenexal tenderness; Speculum exam: Moderate white discharge during pelvic exam.  Cervix visualized without erythema or lesions, white discharge visualized from cervix. Cervical swab obtained Skin: warm and dry Psychological:  Alert and cooperative. Normal mood and affect.   ASSESSMENT & PLAN:  1. Vaginal discomfort   2. Vaginal discharge     Meds ordered this encounter  Medications  . metroNIDAZOLE (FLAGYL) 500 MG tablet    Sig: Take 1 tablet (500 mg total) by mouth 2 (two) times daily.    Dispense:  14 tablet    Refill:  0    Order Specific Question:   Supervising Provider    Answer:   Wynona Luna [phone]  . fluconazole (DIFLUCAN) 200 MG tablet    Sig: Take one dose by mouth, wait 72 hours, and then take second dose by mouth    Dispense:  2 tablet    Refill:  0    Order Specific Question:   Supervising Provider    Answer:   Wynona Luna [[id number]]    Pending: Labs Reviewed  POCT URINALYSIS DIP (DEVICE) - Abnormal; Notable for the following components:      Result Value   Hgb urine dipstick TRACE (*)    All other components within normal limits  POCT PREGNANCY, URINE  CERVICOVAGINAL ANCILLARY ONLY    Urine pregnancy was negative Urine did show trace blood.  Please follow up with PCP for reevaluation and further management if necessary.    Will treat today for potential yeast and BV Cervical swab obtained Prescribed metronidazole 500 mg twice daily for 7 days (do not take while consuming alcohol) Prescribed diflucan 200 mg once daily and then second dose 72 hours later Take medications as prescribed and to completion We will follow up with you regarding the results of your test If tests are positive, please abstain from sexual activity for at least 7 days and notify partners Follow up with PCP if symptoms persists Return here or go to ER if you have any new or worsening  symptoms     Reviewed expectations re: course of current medical issues. Questions answered. Outlined signs and symptoms indicating need for more acute intervention. Patient verbalized understanding. After Visit Summary given.       Lestine Box, PA-C 06/29/18 1605

## 2018-06-29 NOTE — Discharge Instructions (Addendum)
Urine pregnancy was negative Urine did show trace blood.  Please follow up with PCP for reevaluation and further management if necessary.    Will treat today for potential yeast and BV Cervical swab obtained Prescribed metronidazole 500 mg twice daily for 7 days (do not take while consuming alcohol) Prescribed diflucan 200 mg once daily and then second dose 72 hours later Take medications as prescribed and to completion We will follow up with you regarding the results of your test If tests are positive, please abstain from sexual activity for at least 7 days and notify partners Follow up with PCP if symptoms persists Return here or go to ER if you have any new or worsening symptoms

## 2018-07-03 LAB — CERVICOVAGINAL ANCILLARY ONLY
BACTERIAL VAGINITIS: NEGATIVE
Candida vaginitis: NEGATIVE
Chlamydia: NEGATIVE
NEISSERIA GONORRHEA: NEGATIVE
TRICH (WINDOWPATH): NEGATIVE

## 2018-07-20 ENCOUNTER — Encounter (HOSPITAL_COMMUNITY): Payer: Self-pay | Admitting: Emergency Medicine

## 2018-07-20 ENCOUNTER — Ambulatory Visit (HOSPITAL_COMMUNITY)
Admission: EM | Admit: 2018-07-20 | Discharge: 2018-07-20 | Disposition: A | Discharge status: Home / Self Care | Payer: Medicaid Other | Attending: Family Medicine | Admitting: Family Medicine

## 2018-07-20 ENCOUNTER — Other Ambulatory Visit: Payer: Self-pay

## 2018-07-20 DIAGNOSIS — N760 Acute vaginitis: Secondary | ICD-10-CM | POA: Insufficient documentation

## 2018-07-20 DIAGNOSIS — Z87891 Personal history of nicotine dependence: Secondary | ICD-10-CM | POA: Insufficient documentation

## 2018-07-20 DIAGNOSIS — Z833 Family history of diabetes mellitus: Secondary | ICD-10-CM | POA: Insufficient documentation

## 2018-07-20 DIAGNOSIS — N898 Other specified noninflammatory disorders of vagina: Secondary | ICD-10-CM

## 2018-07-20 DIAGNOSIS — Z8249 Family history of ischemic heart disease and other diseases of the circulatory system: Secondary | ICD-10-CM | POA: Diagnosis not present

## 2018-07-20 DIAGNOSIS — Z3202 Encounter for pregnancy test, result negative: Secondary | ICD-10-CM

## 2018-07-20 LAB — POCT URINALYSIS DIP (DEVICE)
Bilirubin Urine: NEGATIVE
GLUCOSE, UA: NEGATIVE mg/dL
Hgb urine dipstick: NEGATIVE
Ketones, ur: NEGATIVE mg/dL
LEUKOCYTES UA: NEGATIVE
Nitrite: NEGATIVE
Protein, ur: NEGATIVE mg/dL
SPECIFIC GRAVITY, URINE: 1.015 (ref 1.005–1.030)
UROBILINOGEN UA: 0.2 mg/dL (ref 0.0–1.0)
pH: 5.5 (ref 5.0–8.0)

## 2018-07-20 LAB — POCT PREGNANCY, URINE: PREG TEST UR: NEGATIVE

## 2018-07-20 MED ORDER — FLUCONAZOLE 150 MG PO TABS: 150.0000 mg | ORAL_TABLET | Freq: Once | ORAL | 0 refills | Status: AC

## 2018-07-20 NOTE — ED Triage Notes (Signed)
Pt reports vaginal d/c & itching and discomfort since Wednesday.

## 2018-07-20 NOTE — ED Provider Notes (Signed)
[facility]    CSN: [id number] Arrival date & time: 07/20/18  [place]     History   Chief Complaint Chief Complaint  Patient presents with  . Vaginal Discharge  . Vaginal Itching    HPI Pam Vargas is a 37 y.o. female history of tobacco use presenting today for evaluation of vaginal discharge and itching.  Patient states that after she had sexual intercourse on Wednesday, 2 days ago she developed itching and discomfort.  Since her symptoms have worsened.  She has noticed a white thick discharge.  Has also had some mild discomfort with urination.  Denies any fever, nausea, vomiting, abdominal pain, back pain.  Denies any new partners or concern for STDs.  Same partner for the past 16 years.  LMP 07/11/2018.  HPI  Past Medical History:  Diagnosis Date  . Preterm labor     Patient Active Problem List   Diagnosis Date Noted  . Supervision of high risk pregnancy, antepartum 04/02/2018  . Advanced maternal age in multigravida 04/02/2018  . Bilateral breast cysts 06/20/2017  . Breast nodule 06/05/2017  . Previous cesarean section 09/12/2016  . History of preterm delivery 09/12/2016  . TOBACCO USER 11/02/2009    Past Surgical History:  Procedure Laterality Date  . CESAREAN SECTION    . FOOT SURGERY    . FOOT SURGERY      OB History    Gravida  5   Para  3   Term  1   Preterm  1   AB  1   Living  2     SAB  1   TAB  0   Ectopic  0   Multiple  0   Live Births  2            Home Medications    Prior to Admission medications   Medication Sig Start Date End Date Taking? Authorizing Provider  Prenatal Vit-Fe Phos-FA-Omega (VITAFOL GUMMIES) 3.33-0.333-34.8 MG CHEW Chew 1 tablet by mouth 3 (three) times daily.  03/29/18  Yes [provider]  fluconazole (DIFLUCAN) 150 MG tablet Take 1 tablet (150 mg total) by mouth once for 1 dose. 07/20/18 07/20/18  Wieters, Elesa Hacker, PA-C    Family History Family History  Problem Relation Age of  Onset  . Diabetes Mother   . Hyperlipidemia Mother   . Hypertension Mother   . Heart disease Father     Social History Social History   Tobacco Use  . Smoking status: Former Smoker    Packs/day: 0.30    Types: Cigarettes    Last attempt to quit: 03/27/2018    Years since quitting: 0.3  . Smokeless tobacco: Never Used  Substance Use Topics  . Alcohol use: Yes    Comment: last drink 04/21/2018  . Drug use: No     Allergies   Patient has no known allergies.   Review of Systems Review of Systems  Constitutional: Negative for fever.  Respiratory: Negative for shortness of breath.   Cardiovascular: Negative for chest pain.  Gastrointestinal: Negative for abdominal pain, diarrhea, nausea and vomiting.  Genitourinary: Positive for dysuria and vaginal discharge. Negative for flank pain, genital sores, hematuria, menstrual problem, vaginal bleeding and vaginal pain.  Musculoskeletal: Negative for back pain.  Skin: Negative for rash.  Neurological: Negative for dizziness, light-headedness and headaches.     Physical Exam Triage Vital Signs ED Triage Vitals  Enc Vitals Group     BP 07/20/18 1856 109/76  Pulse Rate 07/20/18 1856 92     Resp --      Temp 07/20/18 1856 97.9 F (36.6 C)     Temp Source 07/20/18 1856 Oral     SpO2 07/20/18 1856 100 %     Weight --      Height --      Head Circumference --      Peak Flow --      Pain Score 07/20/18 1854 6     Pain Loc --      Pain Edu? --      Excl. in [place]? --    No data found.  Updated Vital Signs BP 109/76 (BP Location: Left Arm)   Pulse 92   Temp 97.9 F (36.6 C) (Oral)   LMP 07/11/2018 (Exact Date)   SpO2 100%   Visual Acuity Right Eye Distance:   Left Eye Distance:   Bilateral Distance:    Right Eye Near:   Left Eye Near:    Bilateral Near:     Physical Exam  Constitutional: She is oriented to person, place, and time. She appears well-developed and well-nourished.  No acute distress  HENT:  Head:  Normocephalic and atraumatic.  Nose: Nose normal.  Eyes: Conjunctivae are normal.  Neck: Neck supple.  Cardiovascular: Normal rate.  Pulmonary/Chest: Effort normal. No respiratory distress.  Abdominal: She exhibits no distension.  Nontender light deep palpation throughout all 4 quadrants and epigastrium  Musculoskeletal: Normal range of motion.  Neurological: She is alert and oriented to person, place, and time.  Skin: Skin is warm and dry.  Psychiatric: She has a normal mood and affect.  Nursing note and vitals reviewed.    UC Treatments / Results  Labs (all labs ordered are listed, but only abnormal results are displayed) Labs Reviewed  POCT URINALYSIS DIP (DEVICE)  POCT PREGNANCY, URINE  CERVICOVAGINAL ANCILLARY ONLY    EKG None  Radiology No results found.  Procedures Procedures (including critical care time)  Medications Ordered in UC Medications - No data to display  Initial Impression / Assessment and Plan / UC Course  I have reviewed the triage vital signs and the nursing notes.  Pertinent labs & imaging results that were available during my care of the patient were reviewed by me and considered in my medical decision making (see chart for details).     Patient with vaginal itching, symptoms correlate more with yeast, will go ahead and empirically treat for this.  Vaginal swab obtained to also check for BV as she also has a history of this.  Will call patient with results and alter treatment as needed.Discussed strict return precautions. Patient verbalized understanding and is agreeable with plan.  Final Clinical Impressions(s) / UC Diagnoses   Final diagnoses:  Vaginal discharge  Acute vaginitis     Discharge Instructions     Please take 1 tablet of Diflucan today, may repeat in 3 days if still having symptoms.  We are testing you for Gonorrhea, Chlamydia, Trichomonas, Yeast and Bacterial Vaginosis. We will call you if anything is positive and let  you know if you require any further treatment. Please inform partners of any positive results.   Please return if symptoms not improving with treatment, development of fever, nausea, vomiting, abdominal pain.    ED Prescriptions    Medication Sig Dispense Auth. Provider   fluconazole (DIFLUCAN) 150 MG tablet Take 1 tablet (150 mg total) by mouth once for 1 dose. 2 tablet Wieters, [place] C, PA-C  Controlled Substance Prescriptions [place] Controlled Substance Registry consulted? Not Applicable   Janith Lima, [place] 07/20/18 1929

## 2018-07-20 NOTE — Discharge Instructions (Signed)
Please take 1 tablet of Diflucan today, may repeat in 3 days if still having symptoms.  We are testing you for Gonorrhea, Chlamydia, Trichomonas, Yeast and Bacterial Vaginosis. We will call you if anything is positive and let you know if you require any further treatment. Please inform partners of any positive results.   Please return if symptoms not improving with treatment, development of fever, nausea, vomiting, abdominal pain.

## 2018-07-23 LAB — CERVICOVAGINAL ANCILLARY ONLY
Bacterial vaginitis: POSITIVE — AB
CHLAMYDIA, DNA PROBE: NEGATIVE
Candida vaginitis: NEGATIVE
NEISSERIA GONORRHEA: NEGATIVE
TRICH (WINDOWPATH): NEGATIVE

## 2018-07-24 ENCOUNTER — Telehealth: Payer: Self-pay

## 2018-07-24 ENCOUNTER — Telehealth: Payer: Self-pay | Admitting: Emergency Medicine

## 2018-07-24 MED ORDER — METRONIDAZOLE 500 MG PO TABS: 500.0000 mg | ORAL_TABLET | Freq: Two times a day (BID) | ORAL | 0 refills | Status: DC

## 2018-07-24 NOTE — Telephone Encounter (Signed)
Patient returning call from lab results. Notified patient bacterial vaginosis is positive. This was not treated at the urgent care visit. Flagyl 500 mg BID x 7 days #14 no refills sent to patients pharmacy of choice. Patient verbalized understanding and stated she would pick up the medication.

## 2018-07-24 NOTE — Telephone Encounter (Signed)
Bacterial vaginosis is positive. This was not treated at the urgent care visit. Flagyl 500 mg BID x 7 days #14 no refills sent to patients pharmacy of choice.  Attempted to reach patient, no answer at this time.

## 2018-08-07 ENCOUNTER — Other Ambulatory Visit: Payer: Self-pay

## 2018-08-07 ENCOUNTER — Encounter (HOSPITAL_COMMUNITY): Payer: Self-pay | Admitting: Family Medicine

## 2018-08-07 ENCOUNTER — Ambulatory Visit (HOSPITAL_COMMUNITY)
Admission: EM | Admit: 2018-08-07 | Discharge: 2018-08-07 | Disposition: A | Discharge status: Home / Self Care | Payer: Medicaid Other | Attending: Family Medicine | Admitting: Family Medicine

## 2018-08-07 DIAGNOSIS — M62830 Muscle spasm of back: Secondary | ICD-10-CM | POA: Diagnosis not present

## 2018-08-07 MED ORDER — CYCLOBENZAPRINE HCL 10 MG PO TABS: 10.0000 mg | ORAL_TABLET | Freq: Every evening | ORAL | 0 refills | Status: DC | PRN

## 2018-08-07 MED ORDER — DICLOFENAC SODIUM 75 MG PO TBEC: 75.0000 mg | DELAYED_RELEASE_TABLET | Freq: Two times a day (BID) | ORAL | 0 refills | Status: DC

## 2018-08-07 NOTE — ED Triage Notes (Signed)
Pt states she has back pain x 4 days.

## 2018-08-08 NOTE — ED Provider Notes (Signed)
[facility]   [id number] 08/07/18 Arrival Time: 3151  ASSESSMENT & PLAN:  1. Muscle spasm of back     Meds ordered this encounter  Medications  . diclofenac (VOLTAREN) 75 MG EC tablet    Sig: Take 1 tablet (75 mg total) by mouth 2 (two) times daily.    Dispense:  14 tablet    Refill:  0  . cyclobenzaprine (FLEXERIL) 10 MG tablet    Sig: Take 1 tablet (10 mg total) by mouth at bedtime as needed for muscle spasms.    Dispense:  10 tablet    Refill:  0   Encouraged ROM as tolerated. Medication sedation precautions.  Follow-up Information    Mullis, Kiersten P, DO.   Specialty:  Family Medicine Why:  As needed. Contact information: [address] [place] [address] [phone]           Reviewed expectations re: course of current medical issues. Questions answered. Outlined signs and symptoms indicating need for more acute intervention. Patient verbalized understanding. After Visit Summary given.   SUBJECTIVE: History from: patient.  Pam Vargas is a 37 y.o. female who presents with complaint of intermittent bilateral lower back discomfort. Onset gradual beginning over the past several days. Injury/trama: no. History of back problems: occasional similar symptoms. Discomfort described as soreness without radiation. Certain movements exacerbate the described discomfort. Better with rest. Extremity sensation changes or weakness: none. Ambulatory without difficulty. Normal bowel/bladder habits. No associated abdominal pain/n/v. OTC analgesics not helping.  Reports no fevers, IV drug use, or recent back surgeries or procedures.  ROS: As per HPI.   OBJECTIVE:  Vitals:   08/07/18 1740  BP: 116/76  Pulse: 64  Resp: 18  Temp: 98.2 F (36.8 C)  TempSrc: Oral  SpO2: 100%    General appearance: alert; no distress Neck: supple with FROM; without midline tenderness Lungs: unlabored respirations; symmetrical air entry Abdomen: soft,  non-tender Back: bilateral lower tenderness present over lumbar musculature; FROM at hips with mild discomfort reported; bruising: lower; without midline tenderness Extremities: no cyanosis or edema; symmetrical with no gross deformities Skin: warm and dry Neurologic: normal gait; normal symmetric reflexes; normal LE strength and sensation Psychological: alert and cooperative; normal mood and affect  No Known Allergies  Past Medical History:  Diagnosis Date  . Preterm labor    Social History   Socioeconomic History  . Marital status: Single    Spouse name: Not on file  . Number of children: Not on file  . Years of education: Not on file  . Highest education level: Not on file  Occupational History  . Not on file  Social Needs  . Financial resource strain: Not on file  . Food insecurity:    Worry: Not on file    Inability: Not on file  . Transportation needs:    Medical: Not on file    Non-medical: Not on file  Tobacco Use  . Smoking status: Former Smoker    Packs/day: 0.30    Types: Cigarettes    Last attempt to quit: 03/27/2018    Years since quitting: 0.3  . Smokeless tobacco: Never Used  Substance and Sexual Activity  . Alcohol use: Yes    Comment: last drink 04/21/2018  . Drug use: No  . Sexual activity: Not Currently    Partners: Male    Birth control/protection: None  Lifestyle  . Physical activity:    Days per week: Not on file    Minutes per  session: Not on file  . Stress: Not on file  Relationships  . Social connections:    Talks on phone: Not on file    Gets together: Not on file    Attends religious service: Not on file    Active member of club or organization: Not on file    Attends meetings of clubs or organizations: Not on file    Relationship status: Not on file  . Intimate partner violence:    Fear of current or ex partner: Not on file    Emotionally abused: Not on file    Physically abused: Not on file    Forced sexual activity: Not on file   Other Topics Concern  . Not on file  Social History Narrative   Works at [employer] as a Chartered certified accountant    Family History  Problem Relation Age of Onset  . Diabetes Mother   . Hyperlipidemia Mother   . Hypertension Mother   . Heart disease Father    Past Surgical History:  Procedure Laterality Date  . CESAREAN SECTION    . FOOT SURGERY    . FOOT SURGERY       Vanessa Kick, MD 08/08/18 [phone]

## 2018-08-23 DIAGNOSIS — Z23 Encounter for immunization: Secondary | ICD-10-CM | POA: Diagnosis not present

## 2018-10-05 ENCOUNTER — Other Ambulatory Visit: Payer: Self-pay | Admitting: Family Medicine

## 2018-10-05 DIAGNOSIS — Z1231 Encounter for screening mammogram for malignant neoplasm of breast: Secondary | ICD-10-CM

## 2018-10-08 ENCOUNTER — Ambulatory Visit
Admission: RE | Admit: 2018-10-08 | Discharge: 2018-10-08 | Disposition: A | Discharge status: Home / Self Care | Payer: Medicaid Other | Source: Ambulatory Visit

## 2018-10-08 DIAGNOSIS — Z1231 Encounter for screening mammogram for malignant neoplasm of breast: Secondary | ICD-10-CM

## 2018-10-16 ENCOUNTER — Other Ambulatory Visit: Payer: Self-pay

## 2018-10-16 ENCOUNTER — Encounter (HOSPITAL_COMMUNITY): Payer: Self-pay

## 2018-10-16 ENCOUNTER — Emergency Department (HOSPITAL_COMMUNITY)
Admission: EM | Admit: 2018-10-16 | Discharge: 2018-10-16 | Disposition: A | Discharge status: Home / Self Care | Payer: Medicaid Other | Attending: Emergency Medicine | Admitting: Emergency Medicine

## 2018-10-16 ENCOUNTER — Emergency Department (HOSPITAL_COMMUNITY): Payer: Medicaid Other

## 2018-10-16 DIAGNOSIS — Z87891 Personal history of nicotine dependence: Secondary | ICD-10-CM | POA: Insufficient documentation

## 2018-10-16 DIAGNOSIS — B9789 Other viral agents as the cause of diseases classified elsewhere: Secondary | ICD-10-CM

## 2018-10-16 DIAGNOSIS — J069 Acute upper respiratory infection, unspecified: Secondary | ICD-10-CM

## 2018-10-16 DIAGNOSIS — Z79899 Other long term (current) drug therapy: Secondary | ICD-10-CM | POA: Diagnosis not present

## 2018-10-16 DIAGNOSIS — R05 Cough: Secondary | ICD-10-CM | POA: Diagnosis not present

## 2018-10-16 MED ORDER — IBUPROFEN 800 MG PO TABS: 800.0000 mg | ORAL_TABLET | Freq: Three times a day (TID) | ORAL | 0 refills | Status: DC | PRN

## 2018-10-16 MED ORDER — PROMETHAZINE-DM 6.25-15 MG/5ML PO SYRP: 5.0000 mL | ORAL_SOLUTION | Freq: Four times a day (QID) | ORAL | 0 refills | Status: DC | PRN

## 2018-10-16 MED ORDER — GUAIFENESIN ER 1200 MG PO TB12: 1.0000 | ORAL_TABLET | Freq: Two times a day (BID) | ORAL | 0 refills | Status: DC

## 2018-10-16 NOTE — Discharge Instructions (Addendum)
Return here as needed.  Increase your fluid intake.  Rest as much as possible.

## 2018-10-16 NOTE — ED Triage Notes (Signed)
Pt states she started feeling "bad on Thursday and has gotten progressively worse. Pt states that she is congested and coughing. Pt states pain in the back of her neck since yesterday. Pt states chest burning, but relieved with OJ

## 2018-10-16 NOTE — ED Provider Notes (Signed)
[facility] DEPT Provider Note   CSN: [id number] Arrival date & time: 10/16/18  4696     History   Chief Complaint Chief Complaint  Patient presents with  . URI    HPI Pam Vargas is a 37 y.o. female.  HPI Patient presents to the emergency department with cough, nasal congestion, and nasal drainage.  The patient states that this started 4 days ago.  Patient states she did take some over-the-counter medications which offered temporary relief but no longer helping.  Patient states that nothing seems make the condition worse.  The patient denies chest pain, shortness of breath, headache,blurred vision, neck pain, fever, weakness, numbness, dizziness, anorexia, edema, abdominal pain, nausea, vomiting, diarrhea, rash, back pain, dysuria, hematemesis, bloody stool, near syncope, or syncope. Past Medical History:  Diagnosis Date  . Preterm labor     Patient Active Problem List   Diagnosis Date Noted  . Supervision of high risk pregnancy, antepartum 04/02/2018  . Advanced maternal age in multigravida 04/02/2018  . Bilateral breast cysts 06/20/2017  . Breast nodule 06/05/2017  . Previous cesarean section 09/12/2016  . History of preterm delivery 09/12/2016  . TOBACCO USER 11/02/2009    Past Surgical History:  Procedure Laterality Date  . CESAREAN SECTION    . FOOT SURGERY    . FOOT SURGERY       OB History    Gravida  5   Para  3   Term  1   Preterm  1   AB  1   Living  2     SAB  1   TAB  0   Ectopic  0   Multiple  0   Live Births  2            Home Medications    Prior to Admission medications   Medication Sig Start Date End Date Taking? Authorizing Provider  cyclobenzaprine (FLEXERIL) 10 MG tablet Take 1 tablet (10 mg total) by mouth at bedtime as needed for muscle spasms. 08/07/18   Vanessa Kick, MD  diclofenac (VOLTAREN) 75 MG EC tablet Take 1 tablet (75 mg total) by mouth 2 (two) times daily. 08/07/18    Vanessa Kick, MD  metroNIDAZOLE (FLAGYL) 500 MG tablet Take 1 tablet (500 mg total) by mouth 2 (two) times daily. Patient not taking: Reported on 08/07/2018 07/24/18   Raylene Everts, MD  Prenatal Vit-Fe Phos-FA-Omega (VITAFOL GUMMIES) 3.33-0.333-34.8 MG CHEW Chew 1 tablet by mouth 3 (three) times daily.  03/29/18   [provider]    Family History Family History  Problem Relation Age of Onset  . Diabetes Mother   . Hyperlipidemia Mother   . Hypertension Mother   . Heart disease Father     Social History Social History   Tobacco Use  . Smoking status: Former Smoker    Packs/day: 0.30    Types: Cigarettes    Last attempt to quit: 03/27/2018    Years since quitting: 0.5  . Smokeless tobacco: Never Used  Substance Use Topics  . Alcohol use: Yes    Comment: last drink 04/21/2018  . Drug use: No     Allergies   Patient has no known allergies.   Review of Systems Review of Systems All other systems negative except as documented in the HPI. All pertinent positives and negatives as reviewed in the HPI.  Physical Exam Updated Vital Signs BP 119/75 (BP Location: Right Arm)   Pulse 82   Temp 97.8  F (36.6 C) (Oral)   Resp 16   Ht 5\' 4"  (1.626 m)   Wt 54.4 kg   LMP 09/30/2018   SpO2 100%   BMI 20.60 kg/m   Physical Exam Vitals signs and nursing note reviewed.  Constitutional:      General: She is not in acute distress.    Appearance: She is well-developed.  HENT:     Head: Normocephalic and atraumatic.     Right Ear: Hearing and tympanic membrane normal.     Left Ear: Hearing and tympanic membrane normal.     Nose: Mucosal edema and congestion present.     Right Turbinates: Not enlarged.     Left Turbinates: Not enlarged.     Right Sinus: No maxillary sinus tenderness or frontal sinus tenderness.     Left Sinus: No maxillary sinus tenderness or frontal sinus tenderness.  Eyes:     Pupils: Pupils are equal, round, and reactive to light.  Neck:      Musculoskeletal: Normal range of motion and neck supple.  Cardiovascular:     Rate and Rhythm: Normal rate and regular rhythm.     Heart sounds: Normal heart sounds. No murmur. No friction rub. No gallop.   Pulmonary:     Effort: Pulmonary effort is normal. No respiratory distress.     Breath sounds: Normal breath sounds. No wheezing.  Skin:    General: Skin is warm and dry.     Capillary Refill: Capillary refill takes less than 2 seconds.     Findings: No erythema or rash.  Neurological:     Mental Status: She is alert and oriented to person, place, and time.     Motor: No abnormal muscle tone.     Coordination: Coordination normal.  Psychiatric:        Behavior: Behavior normal.      ED Treatments / Results  Labs (all labs ordered are listed, but only abnormal results are displayed) Labs Reviewed - No data to display  EKG None  Radiology Dg Chest 2 View  Result Date: 10/16/2018 CLINICAL DATA:  Increasing cough and congestion since 10/11/2018. EXAM: CHEST - 2 VIEW COMPARISON:  None. FINDINGS: Lungs clear. Heart size normal. No pneumothorax or pleural fluid. No bony abnormality. IMPRESSION: Normal chest. Electronically Signed   By: Inge Rise M.D.   On: 10/16/2018 12:41    Procedures Procedures (including critical care time)  Medications Ordered in ED Medications - No data to display   Initial Impression / Assessment and Plan / ED Course  I have reviewed the triage vital signs and the nursing notes.  Pertinent labs & imaging results that were available during my care of the patient were reviewed by me and considered in my medical decision making (see chart for details).     Patient be given symptomatic relief for a viral URI with cough.  Patient is advised to return for any worsening in her condition.  Told to increase her fluid intake and rest as much as possible.  Patient agrees to the plan and all questions were answered.  Final Clinical Impressions(s) /  ED Diagnoses   Final diagnoses:  None    ED Discharge Orders    None       Dalia Heading, PA-C 10/16/18 Lupton, Julie, MD 10/16/18 1300

## 2018-10-16 NOTE — ED Notes (Signed)
Bed: WTR5 Expected date:  Expected time:  Means of arrival:  Comments: 

## 2018-11-21 IMAGING — US US OB COMP LESS 14 WK
1 series · 15 of 28 positions shown · non-contrast
Comparison: 03/27/2018

CLINICAL DATA: Vaginal bleeding in pregnancy. Current assigned
gestational age of 11 weeks 4 days.

EXAM:
OBSTETRIC <14 WK ULTRASOUND
TECHNIQUE: Transabdominal ultrasound was performed for evaluation of the
gestation as well as the maternal uterus and adnexal regions.

[Series 1: us ob comp less 14 wk · 33 acquisitions, 15 frames shown]
[im 1/33]
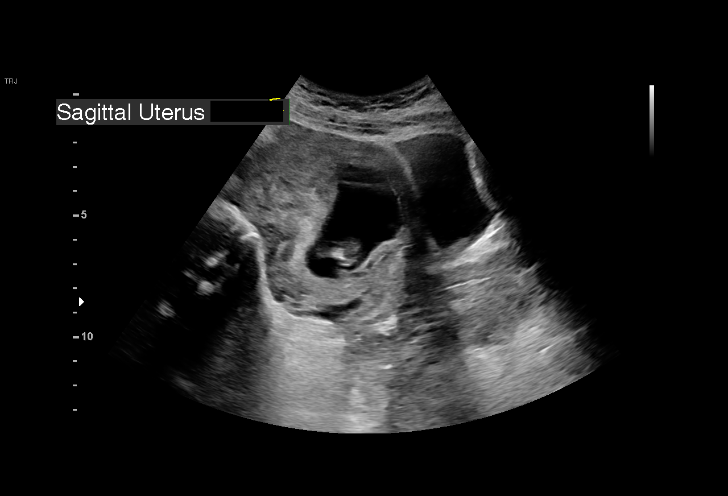
[im 3/33]
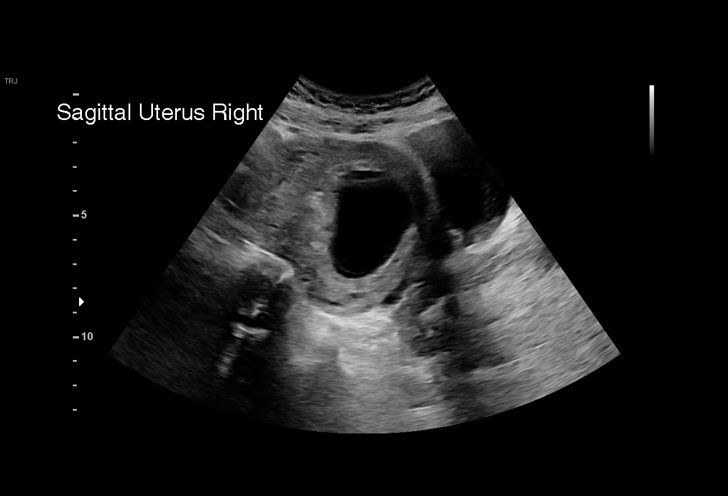
[im 5/33]
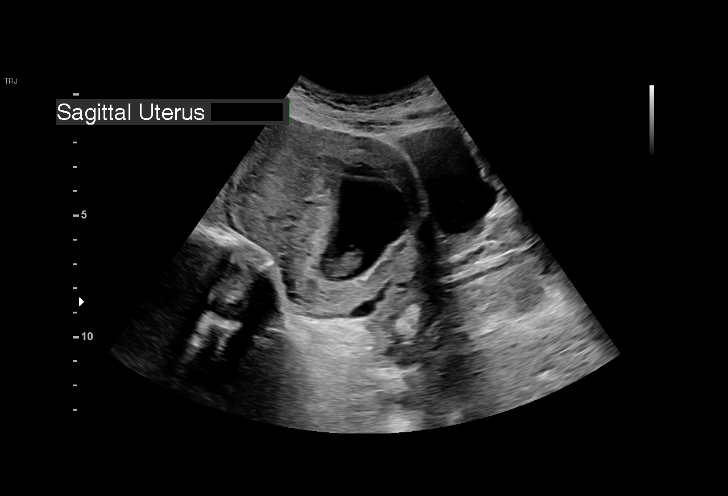
[im 8/33]
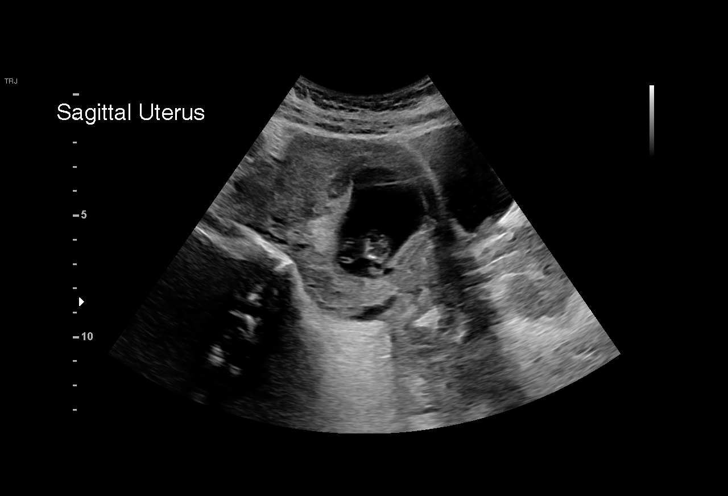
[im 10/33]
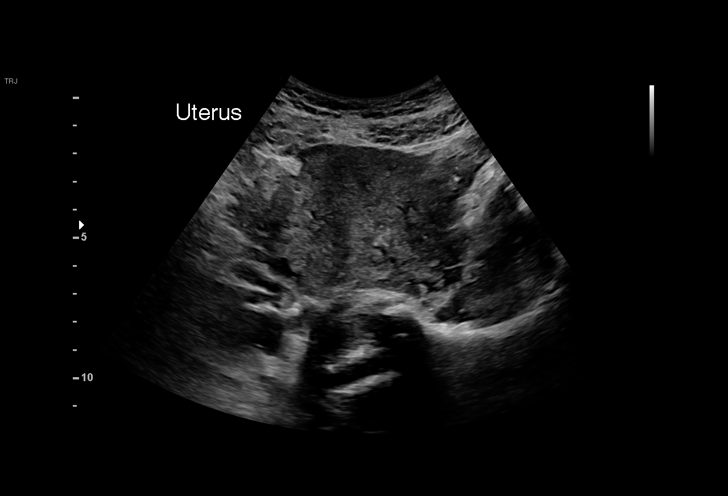
[im 12/33]
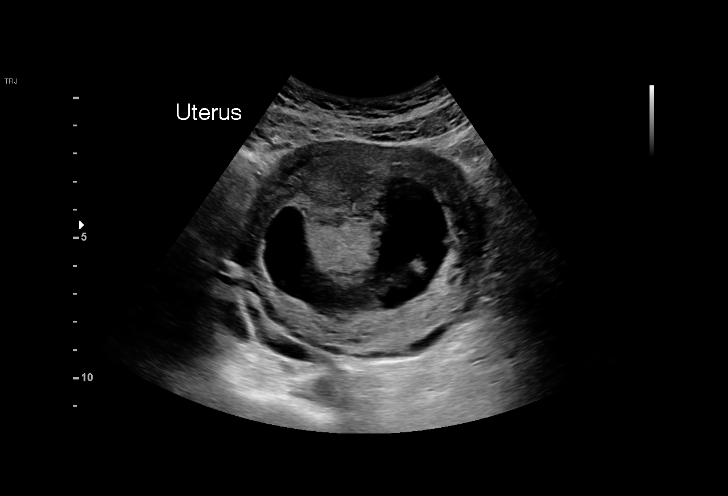
[im 15/33]
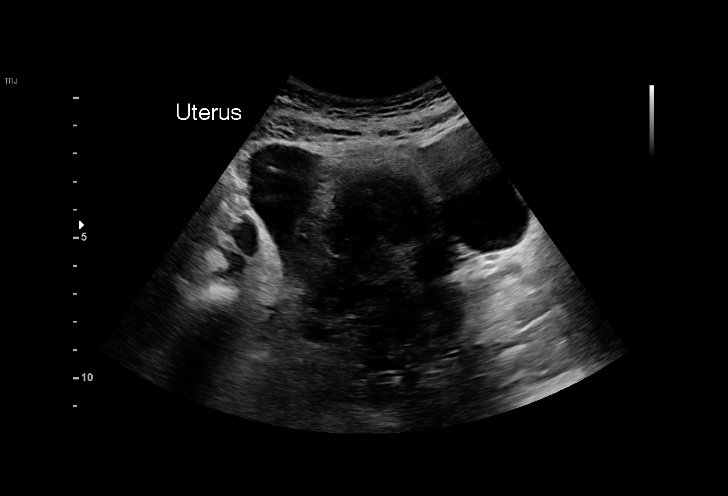
[im 17/33]
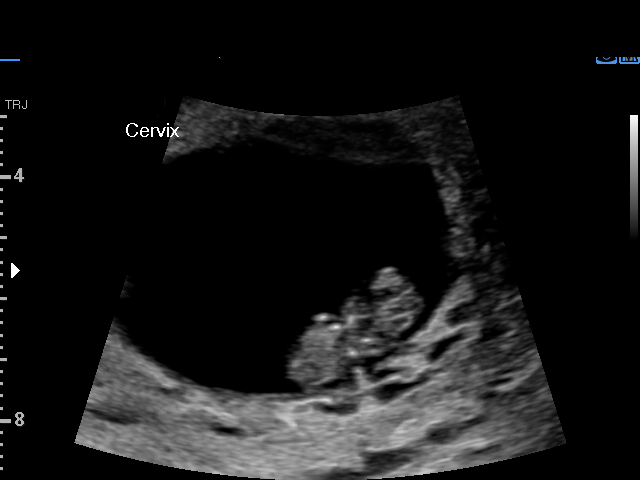
[im 18/33]
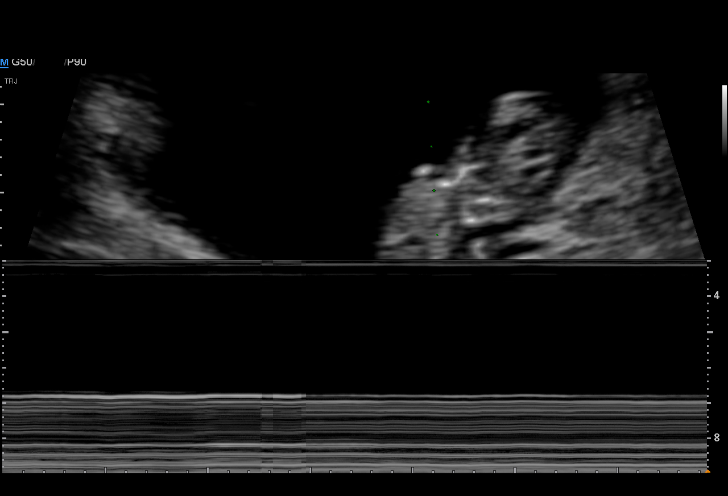
[im 21/33]
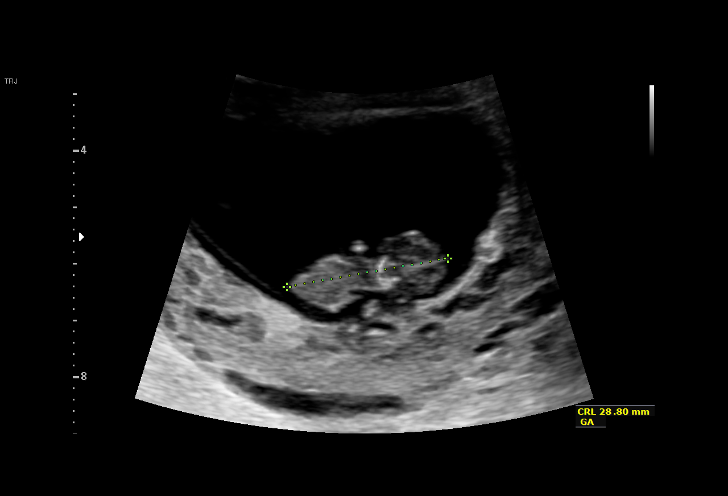
[im 23/33]
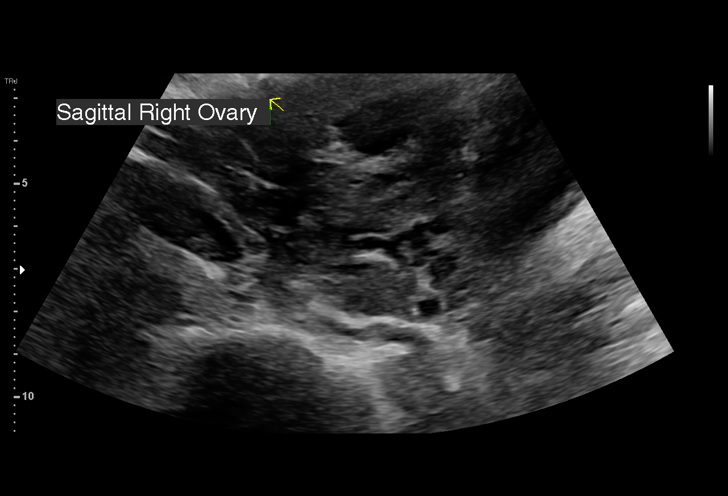
[im 25/33]
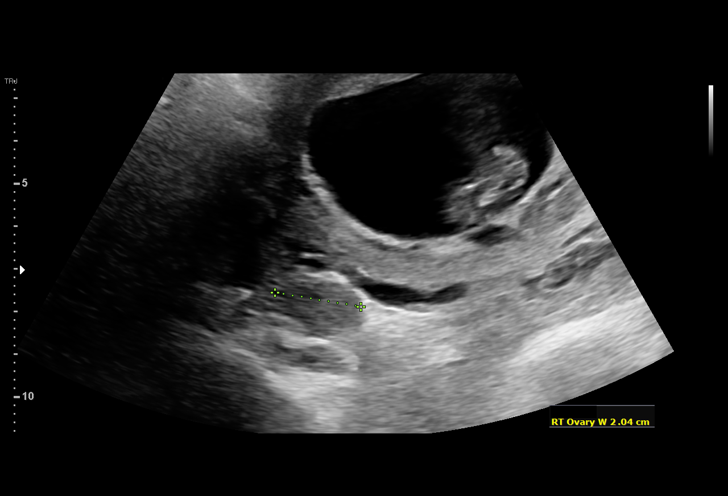
[im 28/33]
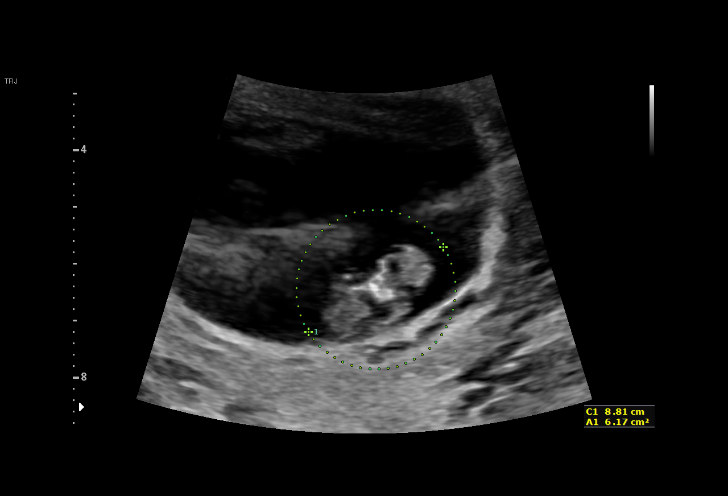
[im 30/33]
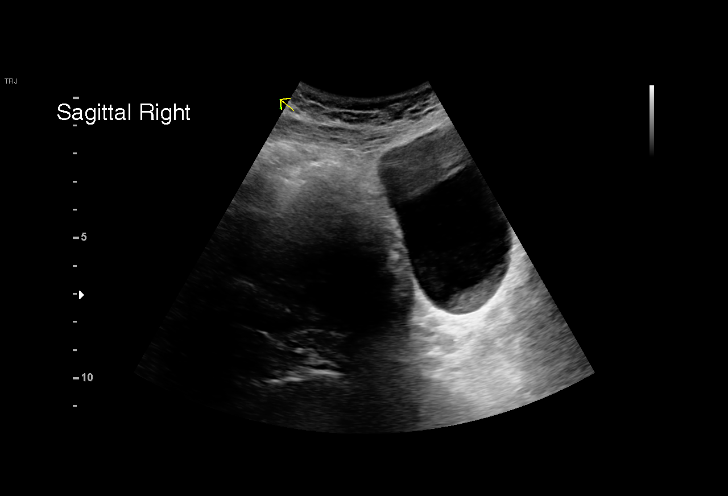
[im 33/33]
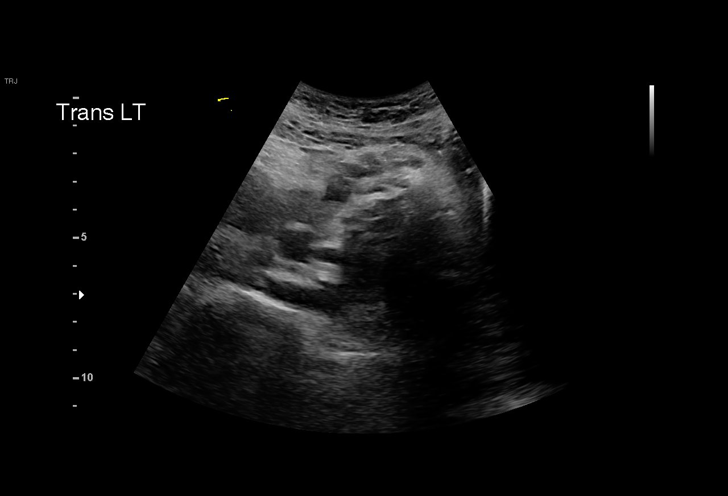

[15 of 28 positions shown; findings below may reference images not displayed]

FINDINGS: Intrauterine gestational sac: Single

Yolk sac:  Not Visualized.

Embryo:  Visualized.

Cardiac Activity: None visualized.

CRL:   28 mm   9 w 4 d                  US EDC: 11/19/2018

Subchorionic hemorrhage:  None visualized.

Maternal uterus/adnexae: Both ovaries are normal appearance. No mass
or abnormal free fluid identified.
IMPRESSION: Findings meet definitive criteria for failed IUP. This follows SRU
consensus guidelines: Diagnostic Criteria for Nonviable Pregnancy
Early in the First Trimester. N Engl J Med 1961;[DATE].

## 2019-03-24 ENCOUNTER — Ambulatory Visit (HOSPITAL_COMMUNITY)
Admission: EM | Admit: 2019-03-24 | Discharge: 2019-03-24 | Disposition: A | Discharge status: Home / Self Care | Payer: Medicaid Other | Attending: Emergency Medicine | Admitting: Emergency Medicine

## 2019-03-24 ENCOUNTER — Encounter (HOSPITAL_COMMUNITY): Payer: Self-pay | Admitting: Emergency Medicine

## 2019-03-24 DIAGNOSIS — B379 Candidiasis, unspecified: Secondary | ICD-10-CM

## 2019-03-24 MED ORDER — FLUCONAZOLE 200 MG PO TABS: 200.0000 mg | ORAL_TABLET | Freq: Once | ORAL | 0 refills | Status: AC

## 2019-03-24 NOTE — Discharge Instructions (Addendum)
May take Diflucan today, repeat in 3 days if needed.  Please return to clinic if you develop worsening vaginal discharge, pain, bleeding, burning with urination, urinary frequency.

## 2019-03-24 NOTE — ED Triage Notes (Signed)
Pt c/o vaginal itching x2 days.

## 2019-03-24 NOTE — ED Provider Notes (Signed)
[facility]    CSN: [id number] Arrival date & time: 03/24/19  1658     History   Chief Complaint Chief Complaint  Patient presents with  . Vaginal Itching    HPI Pam Vargas is a 38 y.o. female for acute concern of vaginal itching.  Patient states this started yesterday, has been worse today.  Patient also endorses some vaginal pain.  Denies discharge, dyspareunia, vaginal bleeding, trauma to the area.  Patient has remote history of infections and BV.  Patient states this does not feel like BV, more like her yeast infections.  Patient is largely concerned due to pain.  Denies burning with urination, urinary frequency, urgency.    Past Medical History:  Diagnosis Date  . Preterm labor     Patient Active Problem List   Diagnosis Date Noted  . Supervision of high risk pregnancy, antepartum 04/02/2018  . Advanced maternal age in multigravida 04/02/2018  . Bilateral breast cysts 06/20/2017  . Breast nodule 06/05/2017  . Previous cesarean section 09/12/2016  . History of preterm delivery 09/12/2016  . TOBACCO USER 11/02/2009    Past Surgical History:  Procedure Laterality Date  . CESAREAN SECTION    . FOOT SURGERY    . FOOT SURGERY      OB History    Gravida  5   Para  3   Term  1   Preterm  1   AB  1   Living  2     SAB  1   TAB  0   Ectopic  0   Multiple  0   Live Births  2            Home Medications    Prior to Admission medications   Medication Sig Start Date End Date Taking? Authorizing Provider  cyclobenzaprine (FLEXERIL) 10 MG tablet Take 1 tablet (10 mg total) by mouth at bedtime as needed for muscle spasms. 08/07/18   Vanessa Kick, MD  diclofenac (VOLTAREN) 75 MG EC tablet Take 1 tablet (75 mg total) by mouth 2 (two) times daily. 08/07/18   Vanessa Kick, MD  fluconazole (DIFLUCAN) 200 MG tablet Take 1 tablet (200 mg total) by mouth once for 1 dose. May repeat in 72 hours if needed 03/24/19 03/24/19  Hall-Potvin,  [place], PA-C  Guaifenesin 1200 MG TB12 Take 1 tablet (1,200 mg total) by mouth 2 (two) times daily. 10/16/18   Lawyer, Harrell Gave, PA-C  ibuprofen (ADVIL,MOTRIN) 800 MG tablet Take 1 tablet (800 mg total) by mouth every 8 (eight) hours as needed. 10/16/18   Lawyer, Harrell Gave, PA-C  metroNIDAZOLE (FLAGYL) 500 MG tablet Take 1 tablet (500 mg total) by mouth 2 (two) times daily. Patient not taking: Reported on 08/07/2018 07/24/18   Raylene Everts, MD  Prenatal Vit-Fe Phos-FA-Omega (VITAFOL GUMMIES) 3.33-0.333-34.8 MG CHEW Chew 1 tablet by mouth 3 (three) times daily.  03/29/18   [provider]  promethazine-dextromethorphan (PROMETHAZINE-DM) 6.25-15 MG/5ML syrup Take 5-10 mLs by mouth 4 (four) times daily as needed for cough. 10/16/18   Dalia Heading, PA-C    Family History Family History  Problem Relation Age of Onset  . Diabetes Mother   . Hyperlipidemia Mother   . Hypertension Mother   . Heart disease Father     Social History Social History   Tobacco Use  . Smoking status: Former Smoker    Packs/day: 0.30    Types: Cigarettes    Last attempt to quit: 03/27/2018    Years  since quitting: 0.9  . Smokeless tobacco: Never Used  Substance Use Topics  . Alcohol use: Yes    Comment: last drink 04/21/2018  . Drug use: No     Allergies   Patient has no known allergies.   Review of Systems Review of Systems as per HPI   Physical Exam Triage Vital Signs ED Triage Vitals [03/24/19 1711]  Enc Vitals Group     BP 108/76     Pulse Rate 100     Resp 18     Temp 98.2 F (36.8 C)     Temp src      SpO2 100 %     Weight      Height      Head Circumference      Peak Flow      Pain Score      Pain Loc      Pain Edu?      Excl. in [place]?    No data found.  Updated Vital Signs BP 108/76   Pulse 100   Temp 98.2 F (36.8 C)   Resp 18   LMP 02/22/2019   SpO2 100%   Visual Acuity Right Eye Distance:   Left Eye Distance:   Bilateral Distance:     Right Eye Near:   Left Eye Near:    Bilateral Near:     Physical Exam Constitutional:      General: She is not in acute distress.    Appearance: She is normal weight.  Cardiovascular:     Rate and Rhythm: Normal rate.  Pulmonary:     Effort: Pulmonary effort is normal.  Genitourinary:    Comments: No vulvar swelling, erythema.  Thick, white curdy discharge in introitus and vaginal canal.  No vaginal swelling, erythema, lacerations, bleeding.  Negative for CMT.  Uterus mobile and nontender. Neurological:     Mental Status: She is alert.      UC Treatments / Results  Labs (all labs ordered are listed, but only abnormal results are displayed) Labs Reviewed - No data to display  EKG None  Radiology No results found.  Procedures Procedures (including critical care time)  Medications Ordered in UC Medications - No data to display  Initial Impression / Assessment and Plan / UC Course  I have reviewed the triage vital signs and the nursing notes.  Pertinent labs & imaging results that were available during my care of the patient were reviewed by me and considered in my medical decision making (see chart for details).     Pleasant 38 year old female with remote history of BV and yeast infection presenting for vaginal pruritus x2 days.  Symptomatology more aligned with patient's yeast infection.  Patient currently denies symptoms consistent with her BV.  No urinary symptoms.  Will treat empirically for yeast and defer BV treatment due to lack of vaginal discharge or symptomatology.  Reviewed vaginal hygiene regimen and return precautions.  Verbalized understanding, will start Diflucan today. Final Clinical Impressions(s) / UC Diagnoses   Final diagnoses:  Yeast infection     Discharge Instructions     May take Diflucan today, repeat in 3 days if needed.  Please return to clinic if you develop worsening vaginal discharge, pain, bleeding, burning with urination, urinary  frequency.    ED Prescriptions    Medication Sig Dispense Auth. Provider   fluconazole (DIFLUCAN) 200 MG tablet Take 1 tablet (200 mg total) by mouth once for 1 dose. May repeat in 72 hours if  needed 2 tablet Hall-Potvin, [place], PA-C     Controlled Substance Prescriptions [place] Controlled Substance Registry consulted? Not Applicable   Quincy Sheehan, [place] 03/24/19 1742

## 2019-03-27 ENCOUNTER — Ambulatory Visit (HOSPITAL_COMMUNITY)
Admission: EM | Admit: 2019-03-27 | Discharge: 2019-03-27 | Disposition: A | Discharge status: Home / Self Care | Payer: Medicaid Other | Attending: Internal Medicine | Admitting: Internal Medicine

## 2019-03-27 ENCOUNTER — Encounter (HOSPITAL_COMMUNITY): Payer: Self-pay | Admitting: Emergency Medicine

## 2019-03-27 ENCOUNTER — Other Ambulatory Visit: Payer: Self-pay

## 2019-03-27 DIAGNOSIS — N760 Acute vaginitis: Secondary | ICD-10-CM | POA: Diagnosis not present

## 2019-03-27 LAB — POCT URINALYSIS DIP (DEVICE)
Bilirubin Urine: NEGATIVE
Bilirubin Urine: NEGATIVE
Glucose, UA: NEGATIVE mg/dL
Glucose, UA: NEGATIVE mg/dL
Hgb urine dipstick: NEGATIVE
Hgb urine dipstick: NEGATIVE
Ketones, ur: NEGATIVE mg/dL
Ketones, ur: NEGATIVE mg/dL
Leukocytes,Ua: NEGATIVE
Leukocytes,Ua: NEGATIVE
Nitrite: NEGATIVE
Nitrite: NEGATIVE
Protein, ur: NEGATIVE mg/dL
Protein, ur: NEGATIVE mg/dL
Specific Gravity, Urine: >=1.03 (ref 1.005–1.030)
Specific Gravity, Urine: >=1.03 (ref 1.005–1.030)
Urobilinogen, UA: 0.2 mg/dL (ref 0.0–1.0)
Urobilinogen, UA: 0.2 mg/dL (ref 0.0–1.0)
pH: 6 (ref 5.0–8.0)
pH: 6 (ref 5.0–8.0)

## 2019-03-27 LAB — POCT PREGNANCY, URINE: Preg Test, Ur: NEGATIVE

## 2019-03-27 MED ORDER — TERCONAZOLE 0.4 % VA CREA: 1.0000 | TOPICAL_CREAM | Freq: Every day | VAGINAL | 0 refills | Status: DC

## 2019-03-27 NOTE — ED Provider Notes (Signed)
[place]    CSN: [id number] Arrival date & time: 03/27/19  1639     History   Chief Complaint Chief Complaint  Patient presents with  . Follow-up    HPI Pam Vargas is a 38 y.o. female presenting for follow-up of vaginal itching.  Patient was seen on Sunday 5/24 for same issue.  Patient states that symptoms have largely remained unchanged: Denies abdominal, pelvic, vaginal pain, malodorous discharge, urinary frequency or urgency.  Patient tried Diflucan on Sunday, repeated second dose today.  Feels like this did not adequately relieve symptoms.  Patient is also noticed frothy urine since yesterday.  Denies fever, flank pain, hematuria, burning/stinging sensation with urination or urine malodor.   Past Medical History:  Diagnosis Date  . Preterm labor     Patient Active Problem List   Diagnosis Date Noted  . Supervision of high risk pregnancy, antepartum 04/02/2018  . Advanced maternal age in multigravida 04/02/2018  . Bilateral breast cysts 06/20/2017  . Breast nodule 06/05/2017  . Previous cesarean section 09/12/2016  . History of preterm delivery 09/12/2016  . TOBACCO USER 11/02/2009    Past Surgical History:  Procedure Laterality Date  . CESAREAN SECTION    . FOOT SURGERY    . FOOT SURGERY      OB History    Gravida  5   Para  3   Term  1   Preterm  1   AB  1   Living  2     SAB  1   TAB  0   Ectopic  0   Multiple  0   Live Births  2            Home Medications    Prior to Admission medications   Medication Sig Start Date End Date Taking? Authorizing Provider  ibuprofen (ADVIL,MOTRIN) 800 MG tablet Take 1 tablet (800 mg total) by mouth every 8 (eight) hours as needed. 10/16/18   Lawyer, Harrell Gave, PA-C  Prenatal Vit-Fe Phos-FA-Omega (VITAFOL GUMMIES) 3.33-0.333-34.8 MG CHEW Chew 1 tablet by mouth 3 (three) times daily.  03/29/18   [provider]  terconazole (TERAZOL 7) 0.4 % vaginal cream Place 1  applicator vaginally at bedtime. 03/27/19   Hall-Potvin, [place], PA-C    Family History Family History  Problem Relation Age of Onset  . Diabetes Mother   . Hyperlipidemia Mother   . Hypertension Mother   . Heart disease Father     Social History Social History   Tobacco Use  . Smoking status: Former Smoker    Packs/day: 0.30    Types: Cigarettes    Last attempt to quit: 03/27/2018    Years since quitting: 1.0  . Smokeless tobacco: Never Used  Substance Use Topics  . Alcohol use: Yes    Comment: last drink 04/21/2018  . Drug use: No     Allergies   Patient has no known allergies.   Review of Systems Review of Systems as per HPI   Physical Exam Triage Vital Signs ED Triage Vitals  Enc Vitals Group     BP 03/27/19 1702 (!) 108/59     Pulse Rate 03/27/19 1702 74     Resp 03/27/19 1702 18     Temp 03/27/19 1702 98.4 F (36.9 C)     Temp Source 03/27/19 1702 Oral     SpO2 03/27/19 1702 100 %     Weight --      Height --  Head Circumference --      Peak Flow --      Pain Score 03/27/19 1658 5     Pain Loc --      Pain Edu? --      Excl. in [place]? --    No data found.  Updated Vital Signs BP (!) 108/59 (BP Location: Left Arm)   Pulse 74   Temp 98.4 F (36.9 C) (Oral)   Resp 18   LMP 03/13/2019   SpO2 100%   Visual Acuity Right Eye Distance:   Left Eye Distance:   Bilateral Distance:    Right Eye Near:   Left Eye Near:    Bilateral Near:     Physical Exam Constitutional:      General: She is not in acute distress.    Appearance: She is normal weight.  Cardiovascular:     Rate and Rhythm: Normal rate.  Pulmonary:     Effort: Pulmonary effort is normal.  Genitourinary:    Comments: Patient declined; performed self-swab Neurological:     Mental Status: She is alert.      UC Treatments / Results  Labs (all labs ordered are listed, but only abnormal results are displayed) Labs Reviewed  HIV ANTIBODY (ROUTINE TESTING W REFLEX)  RPR   POC URINE PREG, ED  POCT URINALYSIS DIP (DEVICE)  POCT URINALYSIS DIP (DEVICE)  POCT PREGNANCY, URINE  CERVICOVAGINAL ANCILLARY ONLY    EKG None  Radiology No results found.  Procedures Procedures (including critical care time)  Medications Ordered in UC Medications - No data to display  Initial Impression / Assessment and Plan / UC Course  I have reviewed the triage vital signs and the nursing notes.  Pertinent labs & imaging results that were available during my care of the patient were reviewed by me and considered in my medical decision making (see chart for details).     38 year old female presenting for vulvovaginitis refractory to Diflucan x2.  Patient symptoms largely unchanged.  POCT urinanalysis negative for signs of infection.  Will start topical antifungal for symptom alleviation well labs are pending.  Patient verbalized understanding, agreed with plan. Final Clinical Impressions(s) / UC Diagnoses   Final diagnoses:  Vaginitis and vulvovaginitis     Discharge Instructions     Use antifungal cream nightly. We will call you with results of your labs done today, and treat if indicated. Please return if symptoms worsen, you develop burning/stinging with urination, or pelvic/vaginal pain or bleeding.    ED Prescriptions    Medication Sig Dispense Auth. Provider   terconazole (TERAZOL 7) 0.4 % vaginal cream Place 1 applicator vaginally at bedtime. 45 g Hall-Potvin, [place], PA-C     Controlled Substance Prescriptions [place] Controlled Substance Registry consulted? Not Applicable   Quincy Sheehan, [place] 03/27/19 1825

## 2019-03-27 NOTE — ED Triage Notes (Addendum)
Seen Sunday for vaginal itching, patient is here today due to no relief  Took diflucan as instructed and no better

## 2019-03-27 NOTE — Discharge Instructions (Addendum)
Use antifungal cream nightly. We will call you with results of your labs done today, and treat if indicated. Please return if symptoms worsen, you develop burning/stinging with urination, or pelvic/vaginal pain or bleeding.

## 2019-03-28 LAB — HIV ANTIBODY (ROUTINE TESTING W REFLEX): HIV Screen 4th Generation wRfx: NONREACTIVE

## 2019-03-28 LAB — CERVICOVAGINAL ANCILLARY ONLY
Bacterial vaginitis: NEGATIVE
Candida vaginitis: NEGATIVE
Chlamydia: NEGATIVE
Neisseria Gonorrhea: NEGATIVE
Trichomonas: NEGATIVE

## 2019-03-28 LAB — POCT PREGNANCY, URINE: Preg Test, Ur: NEGATIVE

## 2019-03-28 LAB — RPR: RPR Ser Ql: NONREACTIVE

## 2019-03-29 ENCOUNTER — Telehealth (HOSPITAL_COMMUNITY): Payer: Self-pay | Admitting: Emergency Medicine

## 2019-03-29 NOTE — Telephone Encounter (Signed)
Pt called stating after both medicines, shes still having vaginal itching. Dr. Meda Coffee reviewed chart, pt neg with labs, has been given appropriate treatment and I verified she took medicines correctly. Per Dr. Meda Coffee, we are happy to see her here, however, we have done all testing and treatment possible for her, suggest follow up with OB or PCP. Pt agreeable to plan.

## 2019-07-17 ENCOUNTER — Other Ambulatory Visit: Payer: Self-pay

## 2019-07-17 ENCOUNTER — Ambulatory Visit: Payer: Medicaid Other

## 2019-07-17 ENCOUNTER — Ambulatory Visit (INDEPENDENT_AMBULATORY_CARE_PROVIDER_SITE_OTHER): Payer: Medicaid Other | Admitting: *Deleted

## 2019-07-17 DIAGNOSIS — Z23 Encounter for immunization: Secondary | ICD-10-CM

## 2019-08-15 ENCOUNTER — Encounter: Payer: Self-pay | Admitting: Family Medicine

## 2019-08-15 ENCOUNTER — Ambulatory Visit (INDEPENDENT_AMBULATORY_CARE_PROVIDER_SITE_OTHER): Payer: Medicaid Other | Admitting: Family Medicine

## 2019-08-15 ENCOUNTER — Other Ambulatory Visit: Payer: Self-pay

## 2019-08-15 VITALS — BP 92/62 | HR 85 | Wt 116.8 lb

## 2019-08-15 DIAGNOSIS — Z8759 Personal history of other complications of pregnancy, childbirth and the puerperium: Secondary | ICD-10-CM

## 2019-08-15 NOTE — Progress Notes (Signed)
Patient thought she was due for a pap smear but not due for over another month. Patient elected to reschedule at that time.  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 She had various questions about risk of another miscarriage and birth defects. Her questions were answered to her satisfaction.   No charge as no medical advice or treatment given.

## 2019-08-20 ENCOUNTER — Ambulatory Visit (HOSPITAL_COMMUNITY)
Admission: EM | Admit: 2019-08-20 | Discharge: 2019-08-20 | Disposition: A | Discharge status: Home / Self Care | Payer: Medicaid Other

## 2019-08-20 ENCOUNTER — Other Ambulatory Visit: Payer: Self-pay

## 2019-08-20 ENCOUNTER — Encounter (HOSPITAL_COMMUNITY): Payer: Self-pay | Admitting: Emergency Medicine

## 2019-08-20 DIAGNOSIS — N898 Other specified noninflammatory disorders of vagina: Secondary | ICD-10-CM | POA: Diagnosis not present

## 2019-08-20 MED ORDER — CLOTRIMAZOLE 1 % EX CREA: TOPICAL_CREAM | CUTANEOUS | 0 refills | Status: DC

## 2019-08-20 NOTE — Discharge Instructions (Addendum)
Use the clotrimazole cream twice a day.    Follow-up with your primary care provider or return here if your symptoms are not improving or if you develop new symptoms such as vaginal discharge, abdominal pain, difficulty with urination, back pain, fever, chills, or other symptoms.

## 2019-08-20 NOTE — ED Triage Notes (Signed)
Vaginal itching for 2 days.  No pressure no discharge

## 2019-08-20 NOTE — ED Provider Notes (Signed)
[place]    CSN: [id number] Arrival date & time: 08/20/19  1650      History   Chief Complaint Chief Complaint  Patient presents with  . Vaginal Itching    HPI Pam Vargas is a 38 y.o. female.   Patient presents with 2-day history of vaginal itching.  She took Tylenol which did not relieve her symptoms.  She denies vaginal discharge, pelvic pain, dysuria, abdominal pain, back pain, rash, lesions, or other symptoms.  LMP: 08/02/2019.  The history is provided by the patient.    Past Medical History:  Diagnosis Date  . Preterm labor     Patient Active Problem List   Diagnosis Date Noted  . Supervision of high risk pregnancy, antepartum 04/02/2018  . Advanced maternal age in multigravida 04/02/2018  . Bilateral breast cysts 06/20/2017  . Breast nodule 06/05/2017  . Previous cesarean section 09/12/2016  . History of preterm delivery 09/12/2016  . TOBACCO USER 11/02/2009    Past Surgical History:  Procedure Laterality Date  . CESAREAN SECTION    . FOOT SURGERY    . FOOT SURGERY      OB History    Gravida  5   Para  3   Term  1   Preterm  1   AB  1   Living  2     SAB  1   TAB  0   Ectopic  0   Multiple  0   Live Births  2            Home Medications    Prior to Admission medications   Medication Sig Start Date End Date Taking? Authorizing Provider  BIOTIN PO Take by mouth.   Yes [provider]  folic acid (FOLVITE) 1 MG tablet Take 1 mg by mouth daily.   Yes [provider]  clotrimazole (LOTRIMIN) 1 % cream Apply to affected area 2 times daily 08/20/19   Sharion Balloon, NP  ibuprofen (ADVIL,MOTRIN) 800 MG tablet Take 1 tablet (800 mg total) by mouth every 8 (eight) hours as needed. 10/16/18   Lawyer, Harrell Gave, PA-C  Prenatal Vit-Fe Phos-FA-Omega (VITAFOL GUMMIES) 3.33-0.333-34.8 MG CHEW Chew 1 tablet by mouth 3 (three) times daily.  03/29/18   [provider]  terconazole (TERAZOL 7) 0.4 %  vaginal cream Place 1 applicator vaginally at bedtime. 03/27/19   Hall-Potvin, [place], PA-C    Family History Family History  Problem Relation Age of Onset  . Diabetes Mother   . Hyperlipidemia Mother   . Hypertension Mother   . Heart disease Father     Social History Social History   Tobacco Use  . Smoking status: Former Smoker    Packs/day: 0.30    Types: Cigarettes    Quit date: 03/27/2018    Years since quitting: 1.4  . Smokeless tobacco: Never Used  Substance Use Topics  . Alcohol use: Yes    Comment: last drink 04/21/2018  . Drug use: No     Allergies   Patient has no known allergies.   Review of Systems Review of Systems  Constitutional: Negative for chills and fever.  HENT: Negative for ear pain and sore throat.   Eyes: Negative for pain and visual disturbance.  Respiratory: Negative for cough and shortness of breath.   Cardiovascular: Negative for chest pain and palpitations.  Gastrointestinal: Negative for abdominal pain and vomiting.  Genitourinary: Negative for dysuria, flank pain, hematuria, pelvic pain and vaginal discharge.  Musculoskeletal:  Negative for arthralgias and back pain.  Skin: Negative for color change and rash.  Neurological: Negative for seizures and syncope.  All other systems reviewed and are negative.    Physical Exam Triage Vital Signs ED Triage Vitals  Enc Vitals Group     BP 08/20/19 1712 116/77     Pulse Rate 08/20/19 1712 73     Resp 08/20/19 1712 16     Temp 08/20/19 1712 98.7 F (37.1 C)     Temp Source 08/20/19 1712 Oral     SpO2 08/20/19 1712 98 %     Weight --      Height --      Head Circumference --      Peak Flow --      Pain Score 08/20/19 1715 7     Pain Loc --      Pain Edu? --      Excl. in [place]? --    No data found.  Updated Vital Signs BP 116/77 (BP Location: Left Arm)   Pulse 73   Temp 98.7 F (37.1 C) (Oral)   Resp 16   LMP 08/02/2019   SpO2 98%   Visual Acuity Right Eye Distance:   Left  Eye Distance:   Bilateral Distance:    Right Eye Near:   Left Eye Near:    Bilateral Near:     Physical Exam Vitals signs and nursing note reviewed.  Constitutional:      General: She is not in acute distress.    Appearance: She is well-developed.  HENT:     Head: Normocephalic and atraumatic.     Mouth/Throat:     Mouth: Mucous membranes are moist.     Pharynx: Oropharynx is clear.  Eyes:     Conjunctiva/sclera: Conjunctivae normal.  Neck:     Musculoskeletal: Neck supple.  Cardiovascular:     Rate and Rhythm: Normal rate and regular rhythm.     Heart sounds: No murmur.  Pulmonary:     Effort: Pulmonary effort is normal. No respiratory distress.     Breath sounds: Normal breath sounds.  Abdominal:     General: Bowel sounds are normal.     Palpations: Abdomen is soft.     Tenderness: There is no abdominal tenderness. There is no right CVA tenderness, left CVA tenderness, guarding or rebound.  Skin:    General: Skin is warm and dry.  Neurological:     General: No focal deficit present.     Mental Status: She is alert and oriented to person, place, and time.      UC Treatments / Results  Labs (all labs ordered are listed, but only abnormal results are displayed) Labs Reviewed - No data to display  EKG   Radiology No results found.  Procedures Procedures (including critical care time)  Medications Ordered in UC Medications - No data to display  Initial Impression / Assessment and Plan / UC Course  I have reviewed the triage vital signs and the nursing notes.  Pertinent labs & imaging results that were available during my care of the patient were reviewed by me and considered in my medical decision making (see chart for details).    Vaginal itching.  Treating with clotrimazole cream.  Instructed patient to follow-up with her PCP or return here for symptoms or not improving or if she develops vaginal discharge, dysuria, abdominal pain, back pain, fever,  chills, or other symptoms.  Patient agrees to plan of care.  Final Clinical Impressions(s) / UC Diagnoses   Final diagnoses:  Vaginal itching     Discharge Instructions     Use the clotrimazole cream twice a day.    Follow-up with your primary care provider or return here if your symptoms are not improving or if you develop new symptoms such as vaginal discharge, abdominal pain, difficulty with urination, back pain, fever, chills, or other symptoms.        ED Prescriptions    Medication Sig Dispense Auth. Provider   clotrimazole (LOTRIMIN) 1 % cream Apply to affected area 2 times daily 15 g Sharion Balloon, NP     PDMP not reviewed this encounter.   Sharion Balloon, NP 08/20/19 [phone]

## 2019-09-20 ENCOUNTER — Other Ambulatory Visit (HOSPITAL_COMMUNITY)
Admission: RE | Admit: 2019-09-20 | Discharge: 2019-09-20 | Disposition: A | Discharge status: Home / Self Care | Payer: Medicaid Other | Source: Ambulatory Visit | Attending: Family Medicine | Admitting: Family Medicine

## 2019-09-20 ENCOUNTER — Encounter: Payer: Self-pay | Admitting: Family Medicine

## 2019-09-20 ENCOUNTER — Other Ambulatory Visit: Payer: Self-pay

## 2019-09-20 ENCOUNTER — Ambulatory Visit (INDEPENDENT_AMBULATORY_CARE_PROVIDER_SITE_OTHER): Payer: Medicaid Other | Admitting: Family Medicine

## 2019-09-20 VITALS — BP 110/70 | HR 75 | Wt 117.8 lb

## 2019-09-20 DIAGNOSIS — Z131 Encounter for screening for diabetes mellitus: Secondary | ICD-10-CM | POA: Diagnosis not present

## 2019-09-20 DIAGNOSIS — Z113 Encounter for screening for infections with a predominantly sexual mode of transmission: Secondary | ICD-10-CM | POA: Diagnosis not present

## 2019-09-20 DIAGNOSIS — Z124 Encounter for screening for malignant neoplasm of cervix: Secondary | ICD-10-CM | POA: Diagnosis not present

## 2019-09-20 DIAGNOSIS — Z23 Encounter for immunization: Secondary | ICD-10-CM | POA: Diagnosis not present

## 2019-09-20 DIAGNOSIS — M7522 Bicipital tendinitis, left shoulder: Secondary | ICD-10-CM

## 2019-09-20 LAB — POCT GLYCOSYLATED HEMOGLOBIN (HGB A1C): Hemoglobin A1C: 5.4 % (ref 4.0–5.6)

## 2019-09-20 NOTE — Progress Notes (Signed)
Subjective:   Patient ID: Pam Vargas    DOB: 05/04/1981, 38 y.o. female   MRN: [id number]  Pam Vargas is a 38 y.o. female with a history of tobacco use.  Need for Pap-smear: Patient presenting today for pap-smear and STD testing. She notes she is sexually active with one sexual partner (husband). Denies any vaginal discharge. History of vaginal itching but improved with cranberry juice.  LMP 10/29-11/3. Patient is interested in having a baby. She has a history of 3 miss carriages. 2017, 2018, 2019. She has history two successful pregnancies: one VBAC premature delivery at 25 weeks in 2005 and one term pregnancy via c-section in 1999. Not currently on birth control.   Left Shoulder Pain: Patient notes left anterior shoulder x 2-3 days. Works at [employer] as a Chartered certified accountant. Denies any numbness/tingling or weakness. Notes pain improved with tylenol. Has not tried ibuprofen. Denies any trauma, just tweaked it at work.   Family History of Diabetes. Patient would like to be tested as it runs in her family. Has never been evaluated for it in the past. Denies any polyuria, polydipsia, polyphagia.   Health Maintenance: Due for pap-smear and TDAP  Review of Systems:  Per HPI.   [place], medications and smoking status reviewed.  Objective:   BP 110/70   Pulse 75   Wt 117 lb 12.8 oz (53.4 kg)   SpO2 100%   BMI 20.22 kg/m  Vitals and nursing note reviewed.  General: pleasant older lady, well nourished, well developed, in no acute distress with non-toxic appearance, sitting comfortably in exam chair  Resp: Speaking in full sentences, breathing comfortably on room air Abdomen: soft, non-tender, non-distended Skin: warm, dry Extremities: warm and well perfused MSK: See below Neuro: Alert and oriented, speech normal Pelvic exam: VULVA: normal appearing vulva with no masses, tenderness or lesions, VAGINA: normal appearing vagina with normal color and discharge, no lesions, CERVIX: lesion  present: large hypopigmented cervical nodule at 8 o'clock region, nontender to palpation, PAP: Pap smear done today, exam chaperoned by Leonia Corona.  Shoulder: Inspection reveals no obvious deformity, atrophy, or asymmetry. No bruising. No swelling Palpation: Tender to [a;[atopm anterior shoulder.  is normal with no TTP over [facility] joint or bicipital groove. Full ROM in flexion, abduction, internal/external rotation NV intact distally Normal scapular function observed. Special Tests:  - Impingement: Neg [place], empty can sign. - Supraspinatous: Negative empty can.  5/5 strength with resisted flexion at 20 degrees - Infraspinatous/Teres Minor: 5/5 strength with ER - Subscapularis: 5/5 strength with IR - Biceps tendon: Positive Speeds - AC Joint: Negative cross arm - No painful arc and no drop arm sign  Assessment & Plan:   Biceps tendinitis of left upper extremity History and physical exam most consistent with bicep tendonitis. Recommend rest, ice, OTC topical and oral NSAIDs for pain relief, and regular bicep tendon home exercises. Activity as tolerated with avoidance of activity that aggravates left arm. RTC if no improvement is 6-8 weeks for further evaluation. Patient understood and agreed to plan.   Cervical Cancer Screening:  Pap-smear performed today. Will call with results.  STD Screening: Per patient request STD screening including GC/Cl, RPR, HIV obtained. Will call with results.   Desire for Pregnancy: Patient endorsed desire for pregnancy. Discussed risks associated with pregnancy in advanced maternal age. Patient understood. Recommended decreased adverse effects including avoiding alcohol, tobacco, and taking daily pre-natal vitamin even while trying to get pregnant. Patient understood and agreed.   Screen  for Diabetes: Given family history, patient opted to be screen. No signs or symptoms to indicate onset of diabetes. Hgb A1C obtained and noted to be 5.4.   Health  Maintenance TDAP given today  Orders Placed This Encounter  Procedures  . Tdap vaccine greater than or equal to 7yo IM  . HIV antibody (with reflex)  . RPR  . HgB A1c   Mina Marble, DO PGY-2, [facility] 09/21/2019 2:54 PM

## 2019-09-20 NOTE — Patient Instructions (Addendum)
Thank you so much for coming in today.    Today we performed your Pap-smear and STD testing. I will call if there are any abnormal results.  You will need another pap-smear in 5 years.  Given you are trying to get pregnant, I recommend you be sure to take a prenatal vitamin regularly.  For your shoulder pain, I believe you may have biceps tendinitis/tendinopathy Physical therapy and home exercises are the most important part of your treatment. Avoid painful activities except with home exercise program or physical therapy Ice area 15 minutes at a time 3-4 times a day Take aleve or ibuprofen as needed. Surgery is an option if you do not improve with conservative care but is rarely needed.

## 2019-09-21 DIAGNOSIS — M7522 Bicipital tendinitis, left shoulder: Secondary | ICD-10-CM | POA: Insufficient documentation

## 2019-09-21 DIAGNOSIS — M7521 Bicipital tendinitis, right shoulder: Secondary | ICD-10-CM | POA: Insufficient documentation

## 2019-09-21 LAB — RPR: RPR Ser Ql: NONREACTIVE

## 2019-09-21 LAB — HIV ANTIBODY (ROUTINE TESTING W REFLEX): HIV Screen 4th Generation wRfx: NONREACTIVE

## 2019-09-21 NOTE — Assessment & Plan Note (Signed)
History and physical exam most consistent with bicep tendonitis. Recommend rest, ice, OTC topical and oral NSAIDs for pain relief, and regular bicep tendon home exercises. Activity as tolerated with avoidance of activity that aggravates left arm. RTC if no improvement is 6-8 weeks for further evaluation. Patient understood and agreed to plan.

## 2019-09-23 LAB — CERVICOVAGINAL ANCILLARY ONLY
Chlamydia: NEGATIVE
Comment: NEGATIVE
Comment: NEGATIVE
Comment: NORMAL
Neisseria Gonorrhea: NEGATIVE
Trichomonas: NEGATIVE

## 2019-09-24 LAB — CYTOLOGY - PAP
Comment: NEGATIVE
Diagnosis: NEGATIVE
High risk HPV: NEGATIVE

## 2019-11-01 ENCOUNTER — Other Ambulatory Visit: Payer: Self-pay

## 2019-11-01 ENCOUNTER — Ambulatory Visit (HOSPITAL_COMMUNITY)
Admission: EM | Admit: 2019-11-01 | Discharge: 2019-11-01 | Disposition: A | Discharge status: Home / Self Care | Payer: Medicaid Other | Attending: Family Medicine | Admitting: Family Medicine

## 2019-11-01 ENCOUNTER — Encounter (HOSPITAL_COMMUNITY): Payer: Self-pay

## 2019-11-01 DIAGNOSIS — M25512 Pain in left shoulder: Secondary | ICD-10-CM

## 2019-11-01 MED ORDER — PREDNISONE 5 MG PO TABS: ORAL_TABLET | ORAL | 0 refills | Status: DC

## 2019-11-01 NOTE — ED Triage Notes (Signed)
Pt present right shoulder pain with burning and tingling. Pt states the pain started 3 days ago.

## 2019-11-01 NOTE — ED Provider Notes (Signed)
Pam Vargas    CSN: [id number] Arrival date & time: 11/01/19  1251      History   Chief Complaint Chief Complaint  Patient presents with  . Shoulder Pain    HPI Pam Vargas is a 39 y.o. female.   She is presenting with right anterior shoulder pain.  The pain is been ongoing for 3 days.  She feels the pain when she is doing repetitive motions like making the bed.  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  Denies any specific inciting event.  Pain is intermittent in nature.  No improvement with Tylenol.  No history of similar symptoms.  No loss of range of motion.  Seems to be localized to the shoulder.  HPI  Past Medical History:  Diagnosis Date  . History of preterm delivery 09/12/2016   25 week VBAC secondary to SOL- [ ] Ask about17-P  . Preterm labor   . Previous cesarean section 09/12/2016   Arrest of descent Desires [ ]  TOLAC vs [ ]  RCS    Patient Active Problem List   Diagnosis Date Noted  . Biceps tendinitis of left upper extremity 09/21/2019  . TOBACCO USER 11/02/2009    Past Surgical History:  Procedure Laterality Date  . CESAREAN SECTION    . FOOT SURGERY    . FOOT SURGERY      OB History    Gravida  5   Para  3   Term  1   Preterm  1   AB  1   Living  2     SAB  1   TAB  0   Ectopic  0   Multiple  0   Live Births  2            Home Medications    Prior to Admission medications   Medication Sig Start Date End Date Taking? Authorizing Provider  BIOTIN PO Take by mouth.    [provider]  folic acid (FOLVITE) 1 MG tablet Take 1 mg by mouth daily.    [provider]  predniSONE (DELTASONE) 5 MG tablet Take 6 pills for first day, 5 pills second day, 4 pills third day, 3 pills fourth day, 2 pills the fifth day, and 1 pill sixth day. 11/01/19   Pam Ax, MD  Prenatal Vit-Fe Phos-FA-Omega (VITAFOL GUMMIES) 3.33-0.333-34.8 MG CHEW Chew 1 tablet by mouth 3 (three) times daily.  03/29/18    [provider]    Family History Family History  Problem Relation Age of Onset  . Diabetes Mother   . Hyperlipidemia Mother   . Hypertension Mother   . Heart disease Father     Social History Social History   Tobacco Use  . Smoking status: Former Smoker    Packs/day: 0.30    Types: Cigarettes    Quit date: 03/27/2018    Years since quitting: 1.6  . Smokeless tobacco: Never Used  Substance Use Topics  . Alcohol use: Yes    Comment: last drink 04/21/2018  . Drug use: No     Allergies   Patient has no known allergies.   Review of Systems Review of Systems  Constitutional: Negative for fever.  HENT: Negative for congestion.   Respiratory: Negative for cough.   Cardiovascular: Negative for chest pain.  Gastrointestinal: Negative for abdominal pain.     Physical Exam Triage Vital Signs ED Triage Vitals  Enc Vitals Group     BP 11/01/19 1319  100/65     Pulse Rate 11/01/19 1319 84     Resp 11/01/19 1319 18     Temp 11/01/19 1319 98.2 F (36.8 C)     Temp Source 11/01/19 1319 Oral     SpO2 11/01/19 1319 100 %     Weight --      Height --      Head Circumference --      Peak Flow --      Pain Score 11/01/19 1318 8     Pain Loc --      Pain Edu? --      Excl. in [place]? --    No data found.  Updated Vital Signs BP 100/65 (BP Location: Left Arm)   Pulse 84   Temp 98.2 F (36.8 C) (Oral)   Resp 18   SpO2 100%   Visual Acuity Right Eye Distance:   Left Eye Distance:   Bilateral Distance:    Right Eye Near:   Left Eye Near:    Bilateral Near:     Physical Exam Gen: NAD, alert, cooperative with exam, well-appearing ENT: normal lips, normal nasal mucosa,  Eye: normal EOM, normal conjunctiva and lids CV:  no edema, +2 pedal pulses   Resp: no accessory muscle use, non-labored,  GI: no masses or tenderness, no hernia  Skin: no rashes, no areas of induration  Neuro: normal tone, normal sensation to touch Psych:  normal insight, alert and  oriented MSK:  Right shoulder:  Normal range of motion. Normal internal and external rotation. Normal strength resistance. Normal empty can test. Positive speeds test. Neurovascular intact   UC Treatments / Results  Labs (all labs ordered are listed, but only abnormal results are displayed) Labs Reviewed - No data to display  EKG   Radiology No results found.  Procedures Procedures (including critical care time)  Medications Ordered in UC Medications - No data to display  Initial Impression / Assessment and Plan / UC Course  I have reviewed the triage vital signs and the nursing notes.  Pertinent labs & imaging results that were available during my care of the patient were reviewed by me and considered in my medical decision making (see chart for details).     Pam Vargas is a 36-year-old female is presenting with right shoulder pain.  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  Provided prednisone.  Counseled supportive care.  Given indications return to follow-up.  Final Clinical Impressions(s) / UC Diagnoses   Final diagnoses:  Acute pain of left shoulder     Discharge Instructions     Please try ice  Please try the medicine Please try voltaren over the counter.  Please follow up if your symptoms fail to improve.     ED Prescriptions    Medication Sig Dispense Auth. Provider   predniSONE (DELTASONE) 5 MG tablet Take 6 pills for first day, 5 pills second day, 4 pills third day, 3 pills fourth day, 2 pills the fifth day, and 1 pill sixth day. 21 tablet Pam Ax, MD     PDMP not reviewed this encounter.   Pam Ax, MD 11/01/19 1426

## 2019-11-01 NOTE — Discharge Instructions (Signed)
Please try ice  Please try the medicine Please try voltaren over the counter.  Please follow up if your symptoms fail to improve.

## 2019-11-12 ENCOUNTER — Other Ambulatory Visit: Payer: Self-pay | Admitting: Family Medicine

## 2019-11-12 DIAGNOSIS — Z1231 Encounter for screening mammogram for malignant neoplasm of breast: Secondary | ICD-10-CM

## 2019-11-14 ENCOUNTER — Ambulatory Visit
Admission: RE | Admit: 2019-11-14 | Discharge: 2019-11-14 | Disposition: A | Discharge status: Home / Self Care | Payer: Medicaid Other | Source: Ambulatory Visit

## 2019-11-14 ENCOUNTER — Other Ambulatory Visit: Payer: Self-pay

## 2019-11-14 DIAGNOSIS — Z1231 Encounter for screening mammogram for malignant neoplasm of breast: Secondary | ICD-10-CM | POA: Diagnosis not present

## 2019-12-15 ENCOUNTER — Encounter (HOSPITAL_COMMUNITY): Payer: Self-pay | Admitting: Emergency Medicine

## 2019-12-15 ENCOUNTER — Other Ambulatory Visit: Payer: Self-pay

## 2019-12-15 ENCOUNTER — Ambulatory Visit (HOSPITAL_COMMUNITY)
Admission: EM | Admit: 2019-12-15 | Discharge: 2019-12-15 | Disposition: A | Discharge status: Home / Self Care | Payer: Medicaid Other | Attending: Physician Assistant | Admitting: Physician Assistant

## 2019-12-15 DIAGNOSIS — M25511 Pain in right shoulder: Secondary | ICD-10-CM

## 2019-12-15 DIAGNOSIS — G8929 Other chronic pain: Secondary | ICD-10-CM

## 2019-12-15 MED ORDER — PREDNISONE 5 MG PO TABS: ORAL_TABLET | ORAL | 0 refills | Status: DC

## 2019-12-15 NOTE — ED Provider Notes (Signed)
[place]    CSN: [id number] Arrival date & time: 12/15/19  1135      History   Chief Complaint Chief Complaint  Patient presents with  . Shoulder Pain    HPI Pam Vargas is a 39 y.o. female.   Patient reports to urgent care today for right shoulder pain. She reports pain in the front, along her collar bone and in her lower neck also. She reports similar pain that she was evaluated on 1/1 in this urgent care, and thought to have biceps tendonitis. She received prednisone taper which helped the symptoms greatly. Pain returned about 1 week ago. 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 She denies weakness, numbness or tingling in her right hand or arm.   She had not started the voltaren gel prescribed and has been taking tylenol.      Past Medical History:  Diagnosis Date  . History of preterm delivery 09/12/2016   25 week VBAC secondary to SOL- [ ] Ask about17-P  . Preterm labor   . Previous cesarean section 09/12/2016   Arrest of descent Desires [ ]  TOLAC vs [ ]  RCS    Patient Active Problem List   Diagnosis Date Noted  . Biceps tendinitis of left upper extremity 09/21/2019  . TOBACCO USER 11/02/2009    Past Surgical History:  Procedure Laterality Date  . CESAREAN SECTION    . FOOT SURGERY    . FOOT SURGERY      OB History    Gravida  5   Para  3   Term  1   Preterm  1   AB  1   Living  2     SAB  1   TAB  0   Ectopic  0   Multiple  0   Live Births  2            Home Medications    Prior to Admission medications   Medication Sig Start Date End Date Taking? Authorizing Provider  Prenatal Vit-Fe Phos-FA-Omega (VITAFOL GUMMIES) 3.33-0.333-34.8 MG CHEW Chew 1 tablet by mouth 3 (three) times daily.  03/29/18  Yes [provider]  BIOTIN PO Take by mouth.    [provider]  folic acid (FOLVITE) 1 MG tablet Take 1 mg by mouth daily.    [provider]  predniSONE  (DELTASONE) 5 MG tablet Take 6 pills for first day, 5 pills second day, 4 pills third day, 3 pills fourth day, 2 pills the fifth day, and 1 pill sixth day. 12/15/19   Manaia Samad, Marguerita Beards, PA-C    Family History Family History  Problem Relation Age of Onset  . Diabetes Mother   . Hyperlipidemia Mother   . Hypertension Mother   . Heart disease Father     Social History Social History   Tobacco Use  . Smoking status: Former Smoker    Packs/day: 0.30    Types: Cigarettes    Quit date: 03/27/2018    Years since quitting: 1.7  . Smokeless tobacco: Never Used  Substance Use Topics  . Alcohol use: Yes    Comment: last drink 04/21/2018  . Drug use: No     Allergies   Patient has no known allergies.   Review of Systems Review of Systems  Musculoskeletal: Positive for arthralgias and neck pain. Negative for joint swelling, myalgias and neck stiffness.  Skin: Negative for color change.  Neurological: Negative for tremors, weakness and  numbness.  All other systems reviewed and are negative.    Physical Exam Triage Vital Signs ED Triage Vitals  Enc Vitals Group     BP 12/15/19 1154 115/78     Pulse Rate 12/15/19 1154 89     Resp 12/15/19 1154 16     Temp 12/15/19 1154 98.3 F (36.8 C)     Temp Source 12/15/19 1154 Oral     SpO2 12/15/19 1154 98 %     Weight --      Height --      Head Circumference --      Peak Flow --      Pain Score 12/15/19 1152 7     Pain Loc --      Pain Edu? --      Excl. in [place]? --    No data found.  Updated Vital Signs BP 115/78   Pulse 89   Temp 98.3 F (36.8 C) (Oral)   Resp 16   LMP 12/12/2019   SpO2 98%   Visual Acuity Right Eye Distance:   Left Eye Distance:   Bilateral Distance:    Right Eye Near:   Left Eye Near:    Bilateral Near:     Physical Exam Vitals and nursing note reviewed.  Constitutional:      General: She is not in acute distress.    Appearance: She is well-developed. She is not ill-appearing.  HENT:     Head:  Normocephalic and atraumatic.  Eyes:     Extraocular Movements: Extraocular movements intact.     Conjunctiva/sclera: Conjunctivae normal.     Pupils: Pupils are equal, round, and reactive to light.  Cardiovascular:     Rate and Rhythm: Normal rate.  Pulmonary:     Effort: Pulmonary effort is normal. No respiratory distress.  Musculoskeletal:     Cervical back: Neck supple.     Right lower leg: No edema.     Left lower leg: No edema.     Comments: Painful speeds test. Empty can negative. Pain with internal rotation and shoulder flexion. Pain is located primarily over anterior shoulder.   Skin:    General: Skin is warm and dry.  Neurological:     General: No focal deficit present.     Mental Status: She is alert and oriented to person, place, and time.  Psychiatric:        Mood and Affect: Mood normal.        Behavior: Behavior normal.        Thought Content: Thought content normal.        Judgment: Judgment normal.      UC Treatments / Results  Labs (all labs ordered are listed, but only abnormal results are displayed) Labs Reviewed - No data to display  EKG   Radiology No results found.  Procedures Procedures (including critical care time)  Medications Ordered in UC Medications - No data to display  Initial Impression / Assessment and Plan / UC Course  I have reviewed the triage vital signs and the nursing notes.  Pertinent labs & imaging results that were available during my care of the patient were reviewed by me and considered in my medical decision making (see chart for details).     #Shoulder pain Discussed case with previous provider who saw patient on 1/1. Etiology is now less clear. Biceps tendonitis vs rotator vs capsular issue. Good response to steroids last visit. Declined injection today.  - prednisone taper - sports  medicine information given - ibuprofen PRN after steroids  Final Clinical Impressions(s) / UC Diagnoses   Final diagnoses:    Chronic right shoulder pain     Discharge Instructions     Take the prednisone as directed. You may begin taking ibuprofen as needed after you finish the steroids  Use the voltaren  Follow up with sports medicine if pain returns in the future, ihave given you two options, one locally in [place] and Dr. Raeford Razor who works in Furniture conserv[employer]/restorer, he saw you in January. Feel free to use whichever option you would like.      ED Prescriptions    Medication Sig Dispense Auth. Provider   predniSONE (DELTASONE) 5 MG tablet Take 6 pills for first day, 5 pills second day, 4 pills third day, 3 pills fourth day, 2 pills the fifth day, and 1 pill sixth day. 21 tablet Megyn Leng, Marguerita Beards, PA-C     PDMP not reviewed this encounter.   Purnell Shoemaker, PA-C 12/15/19 1249

## 2019-12-15 NOTE — ED Triage Notes (Signed)
PT was seen here for same complaint 1/1 and treated with steroids. PT reports right shoulder pain that flared up yesterday. 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

## 2019-12-15 NOTE — Discharge Instructions (Addendum)
Take the prednisone as directed. You may begin taking ibuprofen as needed after you finish the steroids  Use the voltaren  Follow up with sports medicine if pain returns in the future, ihave given you two options, one locally in [place] and Dr. Raeford Razor who works in Furniture conservator/restorer, he saw you in January. Feel free to use whichever option you would like.

## 2019-12-26 NOTE — Progress Notes (Signed)
    SUBJECTIVE:   CHIEF COMPLAINT / HPI:   Proximal biceps tendinitis She reports that she has been having ongoing right shoulder pain for almost 4 weeks now.  She was initially seen in the ED on 11/01/2019 at which time she was diagnosed with a biceps tendinitis given prednisone.  The steroids provided significant improvement but she still notices mild persistent pain in her shoulder.  She is seen in the ED again on 2/14 at which time she was told that she had a chronic shoulder pain issue.  Today, she reports that she has a dull/achy pain that she notices primarily over the front side of her right shoulder with some mild pain moving toward the back of her shoulder as well.  She notes that she has occasional numbness/tingling over the front of her right bicep.  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  She is also unclear on her exact diagnosis and what she should expect in terms of improvement.   PERTINENT  PMH / PSH: Please see HPI  OBJECTIVE:   BP 110/74   Pulse 70   Wt 115 lb (52.2 kg)   LMP 12/12/2019 (Exact Date)   Breastfeeding No   BMI 19.74 kg/m    Shoulder: Inspection reveals no abnormalities, atrophy or asymmetry. Palpation is normal with no tenderness over AC joint or bicipital groove.  Significant tenderness to palpation over the right anterior deltoid. ROM is full in all planes.  With some discomfort elicited during external rotation. Rotator cuff strength normal throughout. No signs of impingement with negative Neer and Hawkin's tests, empty can. Positive Yergason's test.    ASSESSMENT/PLAN:   Biceps tendinitis of right upper extremity -Home therapy exercises provided -Mobic daily -Follow-up in 4 weeks -Work note for light duty for 2 weeks provided     Matilde Haymaker, MD [place]

## 2019-12-27 ENCOUNTER — Ambulatory Visit (INDEPENDENT_AMBULATORY_CARE_PROVIDER_SITE_OTHER): Payer: Medicaid Other | Admitting: Family Medicine

## 2019-12-27 ENCOUNTER — Other Ambulatory Visit: Payer: Self-pay

## 2019-12-27 DIAGNOSIS — M7521 Bicipital tendinitis, right shoulder: Secondary | ICD-10-CM

## 2019-12-27 MED ORDER — MELOXICAM 15 MG PO TABS: 15.0000 mg | ORAL_TABLET | Freq: Every day | ORAL | 0 refills | Status: DC

## 2019-12-27 NOTE — Patient Instructions (Addendum)
I agree with your formal diagnosis.  I do think that you have a biceps tendinitis.  This is an injury which usually caused from overuse and I expect will get better with time although will likely take weeks.  You can start taking Mobic daily.  It is okay to take this every day for the next month or until you experience significant pain relief.  Pick for exercises from the list below and perform these exercises twice daily.  Lets follow-up in 4 weeks to see how you are doing.  If you have not experienced significant improvement then I would recommend formal physical therapy.  Proximal Biceps Tendinitis and Tenosynovitis Rehab Ask your health care provider which exercises are safe for you. Do exercises exactly as told by your health care provider and adjust them as directed. It is normal to feel mild stretching, pulling, tightness, or discomfort as you do these exercises. Stop right away if you feel sudden pain or your pain gets worse. Do not begin these exercises until told by your health care provider. Stretching and range-of-motion exercises These exercises warm up your muscles and joints and improve the movement and flexibility of your arm and shoulder. The exercises also help to relieve pain and stiffness. Forearm rotation 1. Stand or sit with your left / right elbow bent in a 90-degree (right angle). Position your forearm so that the thumb is facing the ceiling (neutral position). 2. Rotate your palm up until it cannot go any farther. 3. Hold this position for __________ seconds. 4. Rotate your palm down until it cannot go any farther. 5. Hold this position for __________ seconds. Repeat __________ times. Complete this exercise __________ times a day. Elbow range of motion 1. Stand or sit with your left / right elbow bent in a 90-degree angle (right angle). Position your forearm so that the thumb is facing the ceiling (neutral position). 2. Slowly bend your elbow as far as you can until you feel  a stretch or cannot go any farther. 3. Hold this position for __________ seconds. 4. Slowly straighten your elbow as far as you can until you feel a stretch or cannot go any farther. 5. Hold this position for __________ seconds. Repeat __________ times. Complete this exercise __________ times a day. Biceps stretch 1. Stand facing a wall, or stand by a door frame. 2. Raise your left / right arm out to your side, to your shoulder height. Place the thumb side of your hand against the wall. Your palm should be facing the floor (palm down). 3. Keeping your arm straight, rotate your body in the opposite direction of the raised arm until you feel a gentle stretch in your biceps. 4. Hold this position for __________ seconds. 5. Slowly return to the starting position. Repeat __________ times. Complete this exercise __________ times a day. Shoulder pendulum  1. Stand near a table or counter that you can hold onto for balance. 2. Bend forward at the waist and let your left / right arm hang straight down. Use your other arm to support you and help you stay balanced. 3. Relax your left / right arm and shoulder muscles, and move your hips and your trunk so your left / right arm swings freely. Your arm should swing because of the motion of your body, not because you are using your arm or shoulder muscles. 4. Keep moving your hips and trunk so your arm swings in the following directions, as told by your health care provider: ? Side  to side. ? Forward and backward. ? In clockwise and counterclockwise circles. Repeat __________ times. Complete this exercise __________ times a day. Shoulder flexion, assisted  1. Stand facing a wall. Put your left / right palm on the wall. 2. Slowly move your left / right hand up the wall (flexion). Stop when you feel a stretch in your shoulder, or when you reach the angle that is recommended by your health care provider. ? Use your other hand to help raise your arm, if needed  (assisted). ? As your hand gets higher, you may need to step closer to the wall. ? Avoid shrugging or lifting your shoulder up as you raise your arm. To do this, keep your shoulder blade tucked down toward your spine. 3. Hold this position for __________ seconds. 4. Slowly return to the starting position. Use your other arm to help, if needed. Repeat __________ times. Complete this exercise __________ times a day. Shoulder flexion 1. Stand with your left / right arm hanging down at your side. 2. Keep your arm straight as you lift your arm forward and toward the ceiling (flexion). 3. Hold this position for __________ seconds. 4. Slowly return to the starting position. Repeat __________ times. Complete this exercise __________ times a day. Sleeper stretch, assisted 1. Lie on your left / right side (injured side) with your hips and knees bent and your left / right arm straight in front of you. 2. Bend your elbow to a 90-degree angle (right angle), so your fingers are pointing to the ceiling. 3. Use your other hand to gently push your arm toward the floor (assisted), stopping when you feel a gentle stretch. ? Keep your shoulder blades lightly squeezed together during the exercise. 4. Hold this position for __________ seconds. 5. Slowly return to the starting position. Repeat __________ times. Complete this exercise __________ times a day. Strengthening exercises These exercises build strength and endurance in your arm and shoulder. Endurance is the ability to use your muscles for a long time, even after they get tired. Biceps curls You can use a weight or an exercise band for this exercise. 1. Sit on a stable chair without armrests, or stand up. 2. Hold a __________ weight in your left / right hand, or hold an exercise band with both hands. Your palms should face up toward the ceiling at the starting position. 3. Bend your left / right elbow and move your hand up toward your shoulder. Keep your  other arm straight down, in the starting position. 4. Hold this position for __________ seconds. 5. Slowly return to the starting position. Repeat __________ times. Complete this exercise __________ times a day. Internal shoulder rotation You will use an exercise band secured to a stable object at waist height for this exercise. A door and doorframe work well. 1. Stand sideways next to a door with your left / right arm closest to the door, holding the exercise band in your hand. 2. With your elbow bent in a 90-degree angle (right angle) and keeping your elbow at your side, bring your hand toward your belly (internal rotation). ? Make sure your wrist is staying straight as you do this exercise. 3. Hold this position for __________ seconds. 4. Slowly return to the starting position. Repeat __________ times. Complete this exercise __________ times a day. External shoulder rotation You will use an exercise band secured to a stable object at waist height for this exercise. A door and doorframe work well. 1. Stand sideways next to  a door with your left / right arm away from the door, holding the exercise band in your hand. 2. With your elbow bent in a 90-degree angle (right angle) and keeping your elbow at your side, swing your arm away from your body (external rotation). ? Make sure your wrist is staying straight as you do this exercise. 3. Hold this position for __________ seconds. 4. Slowly return to the starting position. Repeat __________ times. Complete this exercise __________ times a day. External shoulder rotation, side-lying You will use a weight to do this exercise. 1. Lie on your uninjured side with your left / right arm at your side. Bend your elbow to a 90-degree angle (right angle). Hold a __________ weight in your left / right hand. 2. Keeping your elbow at your side, raise your arm toward the ceiling (external rotation). ? Make sure your wrist is staying straight as you do this  exercise. 3. Hold this position for __________ seconds. 4. Slowly return to the starting position. Repeat __________ times. Complete this exercise __________ times a day. Scapular retraction Scapular retraction is the process of pulling the shoulder blades (scapulae) toward each other, and toward the spine. You will need an exercise band to do this exercise. 1. Sit in a stable chair without armrests, or stand up. 2. Secure an exercise band to a stable object in front of you so the band is at shoulder height. 3. Hold one end of the exercise band in each hand. 4. Squeeze your shoulder blades together and move your elbows slightly behind you (retraction). Do not shrug your shoulders upward while you do this. 5. Hold this position for __________ seconds. 6. Slowly return to the starting position. Repeat __________ times. Complete this exercise __________ times a day. Scapular protraction, supine Scapular protraction is the process of moving your shoulder blades away from each other, and away from the spine, while you lie on your back (supine position). 1. Lie on your back on a firm surface. Hold a __________ weight in your left / right hand. 2. Raise your left / right arm straight into the air so your hand is directly above your shoulder joint. 3. Push the weight into the air so your shoulder (scapula) lifts off the surface that you are lying on. Think of trying to punch the ceiling by only moving your scapula forward (protraction). Do not move your head, neck, or back. 4. Hold this position for __________ seconds. 5. Slowly return to the starting position. Repeat __________ times. Complete this exercise __________ times a day. This information is not intended to replace advice given to you by your health care provider. Make sure you discuss any questions you have with your health care provider. Document Revised: 02/12/2019 Document Reviewed: 10/29/2018 Elsevier Patient Education  [place].

## 2019-12-27 NOTE — Assessment & Plan Note (Signed)
-  Home therapy exercises provided -Mobic daily -Follow-up in 4 weeks -Work note for light duty for 2 weeks provided

## 2020-03-23 ENCOUNTER — Other Ambulatory Visit: Payer: Self-pay

## 2020-03-23 ENCOUNTER — Ambulatory Visit (INDEPENDENT_AMBULATORY_CARE_PROVIDER_SITE_OTHER): Payer: Medicaid Other | Admitting: Family Medicine

## 2020-03-23 ENCOUNTER — Encounter: Payer: Self-pay | Admitting: Family Medicine

## 2020-03-23 VITALS — BP 100/62 | HR 84 | Ht 64.0 in | Wt 115.5 lb

## 2020-03-23 DIAGNOSIS — F17200 Nicotine dependence, unspecified, uncomplicated: Secondary | ICD-10-CM | POA: Diagnosis not present

## 2020-03-23 DIAGNOSIS — N939 Abnormal uterine and vaginal bleeding, unspecified: Secondary | ICD-10-CM | POA: Insufficient documentation

## 2020-03-23 DIAGNOSIS — L853 Xerosis cutis: Secondary | ICD-10-CM | POA: Insufficient documentation

## 2020-03-23 HISTORY — DX: Abnormal uterine and vaginal bleeding, unspecified: N93.9

## 2020-03-23 LAB — POCT HEMOGLOBIN: Hemoglobin: 13.2 g/dL (ref 11–14.6)

## 2020-03-23 LAB — POCT URINE PREGNANCY: Preg Test, Ur: NEGATIVE

## 2020-03-23 MED ORDER — IBUPROFEN 600 MG PO TABS: 600.0000 mg | ORAL_TABLET | Freq: Three times a day (TID) | ORAL | 0 refills | Status: DC | PRN

## 2020-03-23 MED ORDER — TRIAMCINOLONE ACETONIDE 0.1 % EX CREA: 1.0000 "application " | TOPICAL_CREAM | Freq: Two times a day (BID) | CUTANEOUS | 0 refills | Status: DC

## 2020-03-23 NOTE — Patient Instructions (Signed)
Thank you for coming to see me today. It was a pleasure! Today we talked about:   Given that your irregular bleeding has only happened one time, we will just continue to monitor.  I did get some lab work today to be sure your thyroid is not causing this issue.  We will release the results on my chart and call you if there is anything abnormal.  For the area on your hand I have sent in a steroid cream for you to use for the itching and dry skin.  You may also begin to use creams in order to keep your skin more moisturized.  Please follow-up with our clinic if you have continued bleeding or sooner as needed.  If you have any questions or concerns, please do not hesitate to call the office at [phone].  Take Care,   Martinique Manford Sprong, DO

## 2020-03-23 NOTE — Progress Notes (Signed)
   SUBJECTIVE:   CHIEF COMPLAINT / HPI:   AUB: Patient reports that on 4/29 she started having really heavy bleeding and had to change pad 3-4 times per day because it was full of blood. 4/30 she had light bleeding and only had to change twice. May 1 she started having heavy bleeding again. Then May 2-3 she had light bleeding. May 7 she started having light spotting and then May 8-12 was some heavy bleeding and light bleeding. May 18 she started having bleeding again. No cramping or pain. Never had an abnormal pap smear. Her family has history of breast cancer and cervical cancer, but she isn't sure who it is and the relation. She reports some dizziness occasionally, but no lightheadedness, no chest pain or SOB. She is a smoker.   Knot on hand:  Noticed spot on her hand this am on R hand.   Tobacco use disorder Patient reports that she continues to smoke although she is interested in quitting.  She would like pharmacy to reach out to her in order to discuss smoking cessation.  PERTINENT  PMH / PSH: tobacco use disorder  OBJECTIVE:  BP 100/62   Pulse 84   Ht 5\' 4"  (1.626 m)   Wt 115 lb 8 oz (52.4 kg)   LMP 03/07/2020   SpO2 98%   BMI 19.83 kg/m   General: NAD, pleasant Neck: Supple Respiratory: normal work of breathing Psych: AOx3, appropriate affect  ASSESSMENT/PLAN:   TOBACCO USER Patient reports being every day smoker.  She is interested in quitting smoking and would like pharmacy to reach out to her for smoking cessation counseling.  Abnormal uterine bleeding (AUB) Given age and lack of risk factors, discussed options with patient including starting OCP versus NSAIDs.  Patient reports that she is actively trying to get pregnant at this time and would not like to start OCPs.  She is currently not bleeding.  Patient to start taking NSAIDs as she begins to bleed again and will return to the office as she may require further work-up if she has continued AUB.  If patient has  continued bleeding then she will return for evaluation with vaginal ultrasound versus endometrial biopsy.  POCT hemoglobin WNL today in office and no symptoms of anemia currently.   POCT urine pregnancy negative.  Will obtain TSH and free T4 given history of low TSH in 2012 which was not followed up as well as BMP for health maintenance screening.  Dry skin Patient with periodic patch of dry skin on dorsum of right hand and behind left ear.  Will give triamcinolone cream and counseled on using cream for moisturizing.  Patient to return if it does not improve or worsens.    Martinique Shirley, DO PGY-3, [facility]

## 2020-03-23 NOTE — Assessment & Plan Note (Addendum)
Given age and lack of risk factors, discussed options with patient including starting OCP versus NSAIDs.  Patient reports that she is actively trying to get pregnant at this time and would not like to start OCPs.  She is currently not bleeding.  Patient to start taking NSAIDs as she begins to bleed again and will return to the office as she may require further work-up if she has continued AUB.  If patient has continued bleeding then she will return for evaluation with vaginal ultrasound versus endometrial biopsy.  POCT hemoglobin WNL today in office and no symptoms of anemia currently.   POCT urine pregnancy negative.  Will obtain TSH and free T4 given history of low TSH in 2012 which was not followed up as well as BMP for health maintenance screening.

## 2020-03-23 NOTE — Assessment & Plan Note (Signed)
Patient reports being every day smoker.  She is interested in quitting smoking and would like pharmacy to reach out to her for smoking cessation counseling.

## 2020-03-23 NOTE — Assessment & Plan Note (Signed)
Patient with periodic patch of dry skin on dorsum of right hand and behind left ear.  Will give triamcinolone cream and counseled on using cream for moisturizing.  Patient to return if it does not improve or worsens.

## 2020-03-24 LAB — BASIC METABOLIC PANEL
BUN/Creatinine Ratio: 12 (ref 9–23)
BUN: 9 mg/dL (ref 6–24)
CO2: 23 mmol/L (ref 20–29)
Calcium: 9.7 mg/dL (ref 8.7–10.2)
Chloride: 104 mmol/L (ref 96–106)
Creatinine, Ser: 0.74 mg/dL (ref 0.57–1.00)
GFR calc Af Amer: 117 mL/min/{1.73_m2} (ref >59)
GFR calc non Af Amer: 102 mL/min/{1.73_m2} (ref >59)
Glucose: 70 mg/dL (ref 65–99)
Potassium: 3.8 mmol/L (ref 3.5–5.2)
Sodium: 141 mmol/L (ref 134–144)

## 2020-03-24 LAB — TSH: TSH: 0.522 u[IU]/mL (ref 0.450–4.500)

## 2020-03-24 LAB — T4, FREE: Free T4: 1.22 ng/dL (ref 0.82–1.77)

## 2020-04-01 ENCOUNTER — Ambulatory Visit (HOSPITAL_COMMUNITY)
Admission: EM | Admit: 2020-04-01 | Discharge: 2020-04-01 | Disposition: A | Discharge status: Home / Self Care | Payer: Medicaid Other | Attending: Emergency Medicine | Admitting: Emergency Medicine

## 2020-04-01 ENCOUNTER — Other Ambulatory Visit: Payer: Self-pay

## 2020-04-01 ENCOUNTER — Encounter (HOSPITAL_COMMUNITY): Payer: Self-pay | Admitting: Emergency Medicine

## 2020-04-01 DIAGNOSIS — R21 Rash and other nonspecific skin eruption: Secondary | ICD-10-CM

## 2020-04-01 MED ORDER — BETAMETHASONE DIPROPIONATE 0.05 % EX OINT: TOPICAL_OINTMENT | Freq: Two times a day (BID) | CUTANEOUS | 0 refills | Status: DC

## 2020-04-01 MED ORDER — CLOTRIMAZOLE-BETAMETHASONE 1-0.05 % EX CREA: TOPICAL_CREAM | CUTANEOUS | 0 refills | Status: DC

## 2020-04-01 NOTE — Discharge Instructions (Signed)
Please use Lotrisone (clotrimazole/betamethasone) twice daily to areas If insurance not covering combo cream may pick up betamethasone alone and pick up over-the-counter clotrimazole/Lotrimin to use each twice daily  Please monitor for improvement of lesions on hands, follow-up if symptoms changing or worsening

## 2020-04-01 NOTE — ED Provider Notes (Signed)
[place]    CSN: [id number] Arrival date & time: 04/01/20  1538      History   Chief Complaint Chief Complaint  Patient presents with   Insect Bite    HPI Pam Vargas is a 39 y.o. female no significant past medical history presenting today for evaluation of a rash.  Patient has had a lesion to bilateral hands for approximately 2 to 3 weeks.  Reports associated with occasional itching.  Denies associated pain.  She was unsure if this could be bug bites.  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  Denies lesions elsewhere other than hands.  Has not used anything on the areas.  HPI  Past Medical History:  Diagnosis Date   History of preterm delivery 09/12/2016   25 week VBAC secondary to SOL- [ ] Ask about17-P   Preterm labor    Previous cesarean section 09/12/2016   Arrest of descent Desires [ ]  TOLAC vs [ ]  RCS    Patient Active Problem List   Diagnosis Date Noted   Abnormal uterine bleeding (AUB) 03/23/2020   Dry skin 03/23/2020   TOBACCO USER 11/02/2009    Past Surgical History:  Procedure Laterality Date   CESAREAN SECTION     FOOT SURGERY     FOOT SURGERY      OB History    Gravida  5   Para  3   Term  1   Preterm  1   AB  1   Living  2     SAB  1   TAB  0   Ectopic  0   Multiple  0   Live Births  2            Home Medications    Prior to Admission medications   Medication Sig Start Date End Date Taking? Authorizing Provider  betamethasone dipropionate (DIPROLENE) 0.05 % ointment Apply topically 2 (two) times daily. 04/01/20   Dontarious Schaum C, PA-C  clotrimazole-betamethasone (LOTRISONE) cream Apply to affected area 2 times daily x 2 weeks 04/01/20   Saren Corkern, Elesa Hacker, PA-C    Family History Family History  Problem Relation Age of Onset   Diabetes Mother    Hyperlipidemia Mother    Hypertension Mother    Heart disease Father    Hypertension Father     Social History Social History    Tobacco Use   Smoking status: Current Every Day Smoker    Packs/day: 0.30    Types: Cigarettes    Last attempt to quit: 03/27/2018    Years since quitting: 2.0   Smokeless tobacco: Never Used  Substance Use Topics   Alcohol use: Yes    Comment: last drink 04/21/2018   Drug use: No     Allergies   Patient has no known allergies.   Review of Systems Review of Systems  Constitutional: Negative for fatigue and fever.  HENT: Negative for mouth sores.   Eyes: Negative for visual disturbance.  Respiratory: Negative for shortness of breath.   Cardiovascular: Negative for chest pain.  Gastrointestinal: Negative for abdominal pain, nausea and vomiting.  Musculoskeletal: Negative for arthralgias and joint swelling.  Skin: Positive for color change and rash. Negative for wound.  Neurological: Negative for dizziness, weakness, light-headedness and headaches.     Physical Exam Triage Vital Signs ED Triage Vitals  Enc Vitals Group     BP 04/01/20 1654 105/64     Pulse Rate 04/01/20 1654 84  Resp 04/01/20 1654 16     Temp 04/01/20 1654 98.2 F (36.8 C)     Temp Source 04/01/20 1654 Oral     SpO2 04/01/20 1654 100 %     Weight --      Height --      Head Circumference --      Peak Flow --      Pain Score 04/01/20 1700 0     Pain Loc --      Pain Edu? --      Excl. in [place]? --    No data found.  Updated Vital Signs BP 105/64 (BP Location: Left Arm)    Pulse 84    Temp 98.2 F (36.8 C) (Oral)    Resp 16    LMP 03/07/2020    SpO2 100%   Visual Acuity Right Eye Distance:   Left Eye Distance:   Bilateral Distance:    Right Eye Near:   Left Eye Near:    Bilateral Near:     Physical Exam Vitals and nursing note reviewed.  Constitutional:      Appearance: She is well-developed.     Comments: No acute distress  HENT:     Head: Normocephalic and atraumatic.     Nose: Nose normal.  Eyes:     Conjunctiva/sclera: Conjunctivae normal.  Cardiovascular:     Rate  and Rhythm: Normal rate.  Pulmonary:     Effort: Pulmonary effort is normal. No respiratory distress.  Abdominal:     General: There is no distension.  Musculoskeletal:        General: Normal range of motion.     Cervical back: Neck supple.  Skin:    General: Skin is warm and dry.     Comments: Dry scaling appearing circular areas approximately 1 cm to bilateral dorsum of hands, no lesions on palmar surface,  No facial lesions noted on face or lower extremities  Neurological:     Mental Status: She is alert and oriented to person, place, and time.      UC Treatments / Results  Labs (all labs ordered are listed, but only abnormal results are displayed) Labs Reviewed - No data to display  EKG   Radiology No results found.  Procedures Procedures (including critical care time)  Medications Ordered in UC Medications - No data to display  Initial Impression / Assessment and Plan / UC Course  I have reviewed the triage vital signs and the nursing notes.  Pertinent labs & imaging results that were available during my care of the patient were reviewed by me and considered in my medical decision making (see chart for details).     Lesions suggestive of likely fungal versus eczema.  Treating with Lotrisone to cover both.  Monitor for improvement with consistent use over the next 1-2 weeks.  Follow-up if changing or worsening.  Discussed strict return precautions. Patient verbalized understanding and is agreeable with plan.  Final Clinical Impressions(s) / UC Diagnoses   Final diagnoses:  Rash and nonspecific skin eruption     Discharge Instructions     Please use Lotrisone (clotrimazole/betamethasone) twice daily to areas If insurance not covering combo cream may pick up betamethasone alone and pick up over-the-counter clotrimazole/Lotrimin to use each twice daily  Please monitor for improvement of lesions on hands, follow-up if symptoms changing or worsening   ED  Prescriptions    Medication Sig Dispense Auth. Provider   clotrimazole-betamethasone (LOTRISONE) cream Apply to affected area 2  times daily x 2 weeks 45 g Abrar Koone C, PA-C   betamethasone dipropionate (DIPROLENE) 0.05 % ointment Apply topically 2 (two) times daily. 30 g Robson Trickey, Comeri­o C, PA-C     PDMP not reviewed this encounter.   Janith Lima, [place] 04/01/20 1720

## 2020-04-01 NOTE — ED Triage Notes (Signed)
Seen by provider

## 2020-04-13 ENCOUNTER — Ambulatory Visit (INDEPENDENT_AMBULATORY_CARE_PROVIDER_SITE_OTHER): Payer: Medicaid Other | Admitting: Family Medicine

## 2020-04-13 ENCOUNTER — Encounter: Payer: Self-pay | Admitting: Family Medicine

## 2020-04-13 ENCOUNTER — Other Ambulatory Visit: Payer: Self-pay

## 2020-04-13 VITALS — BP 100/60 | HR 94 | Ht 64.0 in | Wt 116.1 lb

## 2020-04-13 DIAGNOSIS — Z331 Pregnant state, incidental: Secondary | ICD-10-CM

## 2020-04-13 DIAGNOSIS — Z3200 Encounter for pregnancy test, result unknown: Secondary | ICD-10-CM

## 2020-04-13 LAB — POCT URINE PREGNANCY: Preg Test, Ur: POSITIVE — AB

## 2020-04-13 MED ORDER — PRENATAL ADULT GUMMY/DHA/FA 0.4-25 MG PO CHEW: 1.0000 | CHEWABLE_TABLET | Freq: Every day | ORAL | 2 refills | Status: DC

## 2020-04-13 NOTE — Progress Notes (Signed)
    SUBJECTIVE:   CHIEF COMPLAINT / HPI: positive home urine pregnancy   Patient reports her LMP was 03/07/20 and she has been actively trying to conceive. Patient reports having two positive pregnancy tests at home. She states that she is excited but is stressed about work. Patient is considering starting a new job. In regards to symptoms of pregnancy, she reports some increased appetite and breast tenderness. Reports one episdoe of emesis two days ago. This is desired pregancy. Patient adds that she was told she would need to be referred to MFM due to her hx of three miscarriages.   PERTINENT  PMH / PSH: hx of three prior miscarriages  OBJECTIVE:   BP 100/60   Pulse 94   Ht 5\' 4"  (1.626 m)   Wt 116 lb 2 oz (52.7 kg)   LMP 03/07/2020   SpO2 99%   BMI 19.93 kg/m    General: female appearing stated age in no acute distress Cardio: Normal S1 and S2, no S3 or S4. Rhythm is regular. No murmurs or rubs.  Bilateral radial pulses palpable Pulm: Clear to auscultation bilaterally, no crackles, wheezing, or diminished breath sounds. Normal respiratory effort, stable on RA Abdomen: Bowel sounds normal. Abdomen soft and non-tender.  Extremities: No peripheral edema. Warm/ well perfused.   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

## 2020-04-13 NOTE — Patient Instructions (Signed)
Congratulations on your pregnancy! We have ordered labs to help get you started before going to Maternal Fetal Medicine for your prenatal care.   Pregnancy After Age 41 Women who become pregnant after the age of 46 have a higher risk for certain problems during pregnancy. This is because older women may already have health problems before becoming pregnant. Older women who are healthy before pregnancy may still develop problems during pregnancy. These problems may affect the mother, the unborn baby (fetus), or both. What are the risks for me? If you are over age 68 and you want to become pregnant or are pregnant, you may have a higher risk of:  Not being able to get pregnant (infertility).  Going into labor early (preterm labor).  Needing surgical delivery of your baby (cesarean delivery, or C-section).  Having high blood pressure (hypertension).  Having complications during pregnancy, such as high blood pressure and other symptoms (preeclampsia).  Having diabetes during pregnancy (gestational diabetes).  Being pregnant with more than one baby.  Loss of the unborn baby before 20 weeks (miscarriage) or after 20 weeks of pregnancy (stillbirth). What are the risks for my baby? Babies born to women over the age of 66 have a higher risk for:  Being born early (prematurity).  Low birth weight, which is less than 5 lb, 8 oz (2.5 kg).  Birth defects, such as Down syndrome and cleft palate.  Health complications, including problems with growth and development. How is prenatal care different for women over age 67? All women should see their health care provider before they try to become pregnant. This is especially important for women over the age of 83. Tell your health care provider about:  Any health problems you have.  Any medicines you take.  Any family history of health problems or chromosome-related defects.  Any problems you have had with past pregnancies or deliveries. If  you are over age 39 and you plan to become pregnant:  Start taking a daily multivitamin a month or more before you try to get pregnant. Your multivitamin should contain 400 mcg (micrograms) of folic acid. If you are over age 24 and pregnant, make sure you:  Keep taking your multivitamin unless your health care provider tells you not to take it.  Keep all prenatal visits as told by your health care provider. This is important.  Have ultrasounds regularly throughout your pregnancy to check for problems.  Talk with your health care provider about other prenatal screening tests that you may need. What additional prenatal tests are needed? Screening tests show whether your baby has a higher risk for birth defects than other babies. Screening tests include:  Ultrasound tests to look for markers that indicate a risk for birth defects.  Maternal blood screening. These are blood tests that measure certain substances in your blood to determine your baby's risk for defects. Screening tests do not show whether your baby has or does not have defects. They only show your baby's risk for certain defects. If your screening tests show that risk factors are present, you may need tests to confirm the defect (diagnostic testing). These tests may include:  Chorionic villus sampling. For this procedure, a tissue sample is taken from the organ that forms in your uterus to nourish your baby (placenta). The sample is removed through your cervix or abdomen and tested.  Amniocentesis. For this procedure, a small amount of the fluid that surrounds the baby in the uterus (amniotic fluid) is removed and tested. What  can I do to stay healthy during my pregnancy? Staying healthy during pregnancy can help you and your baby to have a lower risk for problems during pregnancy, during delivery, or both. Talk with your health care provider for specific instructions about staying healthy during your pregnancy. Nutrition   At  each meal, eat a variety of foods from each of the five food groups. These groups include: ? Proteins such as lean meats, poultry, fish that is low in fat, beans, eggs, and nuts. ? Vegetables such as leafy greens, raw and cooked vegetables, and vegetable juice. ? Fruits that are fresh, frozen, or canned, or 100% fruit juice. ? Dairy products such as low-fat yogurt, cheese, and milk. ? Whole grains including rice, cereal, pasta, and bread.  Talk with your health care provider about how much food in each group is right for you.  Follow instructions from your health care provider about eating and drinking restrictions during pregnancy. ? Do not eat raw eggs, raw meat, or raw fish or seafood. ? Do not eat any fish that contains high amounts of mercury, such as swordfish or mackerel.  Drink 6-8 or more glasses of water a day. You should drink enough fluid to keep your urine pale yellow. Managing weight gain  Ask your health care provider how much weight gain is healthy during pregnancy.  Stay at a healthy weight. If needed, work with your health care provider to lose weight safely. Activity  Exercise regularly, as directed by your health care provider. Ask your health care provider what forms of exercise are safe for you. General instructions  Do not use any products that contain nicotine or tobacco, such as cigarettes and e-cigarettes. If you need help quitting, ask your health care provider.  Do not drink alcohol, use drugs, or abuse prescription medicine.  Take over-the-counter and prescription medicines only as told by your health care provider.  Do not use hot tubs, steam rooms, or saunas.  Talk with your health care provider about your risk of exposure to harmful environmental conditions. This includes exposure to chemicals, radiation, cleaning products, and cat feces. Follow advice from your health care provider about how to limit your exposure. Summary  Women who become pregnant  after the age of 41 have a higher risk for complications during pregnancy.  Problems may affect the mother, the unborn baby (fetus), or both.  All women should see their health care provider before they try to become pregnant. This is especially important for women over the age of 25.  Staying healthy during pregnancy can help both you and your baby to have a lower risk for some of the problems that can happen during pregnancy, during delivery, or both. This information is not intended to replace advice given to you by your health care provider. Make sure you discuss any questions you have with your health care provider. Document Revised: 02/08/2019 Document Reviewed: 02/06/2017 Elsevier Patient Education  [place].

## 2020-04-14 NOTE — Assessment & Plan Note (Addendum)
Positive UPT in office today  Patient referred to MFM for hx of three SABs  Initial OB labs collected       -CBC, RPR, Hep C, Hemoglobin      fractionation cascade, HIV, Dating ultrasound ordered

## 2020-04-15 LAB — OBSTETRIC PANEL, INCLUDING HIV
Antibody Screen: NEGATIVE
Basophils Absolute: 0 10*3/uL (ref 0.0–0.2)
Basos: 1 %
EOS (ABSOLUTE): 0.1 10*3/uL (ref 0.0–0.4)
Eos: 2 %
HIV Screen 4th Generation wRfx: NONREACTIVE
Hematocrit: 44.4 % (ref 34.0–46.6)
Hemoglobin: 14.3 g/dL (ref 11.1–15.9)
Hepatitis B Surface Ag: NEGATIVE
Immature Grans (Abs): 0 10*3/uL (ref 0.0–0.1)
Immature Granulocytes: 0 %
Lymphocytes Absolute: 1.6 10*3/uL (ref 0.7–3.1)
Lymphs: 38 %
MCH: 28.1 pg (ref 26.6–33.0)
MCHC: 32.2 g/dL (ref 31.5–35.7)
MCV: 87 fL (ref 79–97)
Monocytes Absolute: 0.4 10*3/uL (ref 0.1–0.9)
Monocytes: 9 %
Neutrophils Absolute: 2.2 10*3/uL (ref 1.4–7.0)
Neutrophils: 50 %
Platelets: 229 10*3/uL (ref 150–450)
RBC: 5.08 x10E6/uL (ref 3.77–5.28)
RDW: 12.6 % (ref 11.7–15.4)
RPR Ser Ql: NONREACTIVE
Rh Factor: POSITIVE
Rubella Antibodies, IGG: 2.78 index (ref >0.99)
WBC: 4.3 10*3/uL (ref 3.4–10.8)

## 2020-04-15 LAB — HGB FRACTIONATION CASCADE
Hgb A2: 2.6 % (ref 1.8–3.2)
Hgb A: 97.4 % (ref 96.4–98.8)
Hgb F: 0 % (ref 0.0–2.0)
Hgb S: 0 %

## 2020-04-20 ENCOUNTER — Other Ambulatory Visit: Payer: Self-pay | Admitting: Family Medicine

## 2020-04-20 ENCOUNTER — Telehealth: Payer: Self-pay

## 2020-04-20 ENCOUNTER — Ambulatory Visit
Admission: RE | Admit: 2020-04-20 | Discharge: 2020-04-20 | Disposition: A | Discharge status: Home / Self Care | Payer: Medicaid Other | Source: Ambulatory Visit | Attending: Family Medicine | Admitting: Family Medicine

## 2020-04-20 ENCOUNTER — Other Ambulatory Visit: Payer: Self-pay

## 2020-04-20 DIAGNOSIS — N888 Other specified noninflammatory disorders of cervix uteri: Secondary | ICD-10-CM

## 2020-04-20 DIAGNOSIS — Z331 Pregnant state, incidental: Secondary | ICD-10-CM | POA: Diagnosis not present

## 2020-04-20 DIAGNOSIS — Z3201 Encounter for pregnancy test, result positive: Secondary | ICD-10-CM

## 2020-04-20 DIAGNOSIS — Z3A01 Less than 8 weeks gestation of pregnancy: Secondary | ICD-10-CM | POA: Diagnosis not present

## 2020-04-20 NOTE — Telephone Encounter (Signed)
Patient notified of results. Referral to Gyn placed. See result note for ultrasound for details.

## 2020-04-20 NOTE — Progress Notes (Signed)
Discussed results with patient.  Dating ultrasound notable for an 18 mm hyperechoic cervical mass.  Similar cervical mass appreciated on last Pap smear that was large, hypopigmented, at the 8:00 region that was nontender to palpation. During the Pap smear at that time, I obtained scraping of the mass which was negative for atypical cells.  Given current pregnancy will refer to gynecology for further evaluation as I would like to ensure safety of ongoing pregnancy.  We will hold off on MRI at this time pending their recommendations.  Given patient's prior history of 3 spontaneous abortion she is at higher risk and would benefit from MFM referral as well.  Patient instructed to call if she does not hear from anyone within 1 week.  Patient voiced understanding and agreement with plan.

## 2020-04-20 NOTE — Telephone Encounter (Signed)
Malachy Mood at [facility] calls nurse line to give call report on [place]. Please see below.   IMPRESSION: Single live intrauterine gestation at 5 weeks 6 days EGA by crown-rump length.  Hyperechoic mass within body of cervix, 18 mm greatest size question complicated Nabothian cyst or a cervical mass/tumor; recommend characterization by MRI (noncontrast in the setting of early Pregnancy).  Please advise next steps for patient.   Talbot Grumbling, RN

## 2020-04-21 ENCOUNTER — Encounter: Payer: Self-pay | Admitting: Family Medicine

## 2020-04-24 ENCOUNTER — Ambulatory Visit (INDEPENDENT_AMBULATORY_CARE_PROVIDER_SITE_OTHER): Payer: Medicaid Other | Admitting: Family Medicine

## 2020-04-24 ENCOUNTER — Encounter (HOSPITAL_COMMUNITY): Payer: Self-pay | Admitting: Obstetrics and Gynecology

## 2020-04-24 ENCOUNTER — Inpatient Hospital Stay (HOSPITAL_COMMUNITY): Payer: Medicaid Other

## 2020-04-24 ENCOUNTER — Encounter: Payer: Self-pay | Admitting: Family Medicine

## 2020-04-24 ENCOUNTER — Inpatient Hospital Stay (HOSPITAL_COMMUNITY)
Admission: AD | Admit: 2020-04-24 | Discharge: 2020-04-24 | Disposition: A | Discharge status: Home / Self Care | Payer: Medicaid Other | Attending: Obstetrics and Gynecology | Admitting: Obstetrics and Gynecology

## 2020-04-24 ENCOUNTER — Other Ambulatory Visit (HOSPITAL_COMMUNITY)
Admission: RE | Admit: 2020-04-24 | Discharge: 2020-04-24 | Disposition: A | Discharge status: Home / Self Care | Payer: Medicaid Other | Source: Ambulatory Visit | Attending: Family Medicine | Admitting: Family Medicine

## 2020-04-24 ENCOUNTER — Other Ambulatory Visit: Payer: Self-pay

## 2020-04-24 VITALS — BP 100/60 | HR 77 | Ht 64.0 in | Wt 119.1 lb

## 2020-04-24 DIAGNOSIS — Z8249 Family history of ischemic heart disease and other diseases of the circulatory system: Secondary | ICD-10-CM | POA: Diagnosis not present

## 2020-04-24 DIAGNOSIS — N898 Other specified noninflammatory disorders of vagina: Secondary | ICD-10-CM

## 2020-04-24 DIAGNOSIS — Z833 Family history of diabetes mellitus: Secondary | ICD-10-CM | POA: Insufficient documentation

## 2020-04-24 DIAGNOSIS — O4691 Antepartum hemorrhage, unspecified, first trimester: Secondary | ICD-10-CM

## 2020-04-24 DIAGNOSIS — N889 Noninflammatory disorder of cervix uteri, unspecified: Secondary | ICD-10-CM | POA: Diagnosis not present

## 2020-04-24 DIAGNOSIS — Z6790 Unspecified blood type, Rh positive: Secondary | ICD-10-CM | POA: Insufficient documentation

## 2020-04-24 DIAGNOSIS — O209 Hemorrhage in early pregnancy, unspecified: Secondary | ICD-10-CM | POA: Insufficient documentation

## 2020-04-24 DIAGNOSIS — N939 Abnormal uterine and vaginal bleeding, unspecified: Secondary | ICD-10-CM | POA: Diagnosis present

## 2020-04-24 DIAGNOSIS — Z87891 Personal history of nicotine dependence: Secondary | ICD-10-CM | POA: Diagnosis not present

## 2020-04-24 DIAGNOSIS — N8312 Corpus luteum cyst of left ovary: Secondary | ICD-10-CM | POA: Diagnosis not present

## 2020-04-24 DIAGNOSIS — Z3A01 Less than 8 weeks gestation of pregnancy: Secondary | ICD-10-CM | POA: Insufficient documentation

## 2020-04-24 DIAGNOSIS — N888 Other specified noninflammatory disorders of cervix uteri: Secondary | ICD-10-CM | POA: Diagnosis not present

## 2020-04-24 DIAGNOSIS — O26891 Other specified pregnancy related conditions, first trimester: Secondary | ICD-10-CM | POA: Insufficient documentation

## 2020-04-24 DIAGNOSIS — Z3491 Encounter for supervision of normal pregnancy, unspecified, first trimester: Secondary | ICD-10-CM

## 2020-04-24 DIAGNOSIS — Z8349 Family history of other endocrine, nutritional and metabolic diseases: Secondary | ICD-10-CM | POA: Insufficient documentation

## 2020-04-24 LAB — POCT WET PREP (WET MOUNT)
Clue Cells Wet Prep Whiff POC: NEGATIVE
Trichomonas Wet Prep HPF POC: ABSENT

## 2020-04-24 LAB — URINALYSIS, ROUTINE W REFLEX MICROSCOPIC
Bilirubin Urine: NEGATIVE
Glucose, UA: NEGATIVE mg/dL
Hgb urine dipstick: NEGATIVE
Ketones, ur: NEGATIVE mg/dL
Leukocytes,Ua: NEGATIVE
Nitrite: NEGATIVE
Protein, ur: NEGATIVE mg/dL
Specific Gravity, Urine: 1.017 (ref 1.005–1.030)
pH: 7 (ref 5.0–8.0)

## 2020-04-24 NOTE — Discharge Instructions (Signed)

## 2020-04-24 NOTE — MAU Note (Signed)
Went to [facility], was having some brownish d/c, MD noted bleeding on spec exam.  Sent over for further eval. Denies any pain.

## 2020-04-24 NOTE — MAU Provider Note (Signed)
History     CSN: [id number]  Arrival date and time: 04/24/20 1600  First Provider Initiated Contact with Patient 04/24/20 1710      Chief Complaint  Patient presents with   Vaginal Bleeding   39 y.o. [id number] @[redacted]w[redacted]d  sent from office for VB. Was seen in the office today for a vaginal bump and brown vaginal bleeding was seen on speculum exam. Pt denies prior VB. Denies abdominal pain and cramping.   OB History    Gravida  6   Para  3   Term  1   Preterm  1   AB  2   Living  2     SAB  2   TAB  0   Ectopic  0   Multiple  0   Live Births  2           Past Medical History:  Diagnosis Date   History of preterm delivery 09/12/2016   25 week VBAC secondary to SOL- [ ] Ask about17-P   Preterm labor    Previous cesarean section 09/12/2016   Arrest of descent Desires [ ]  TOLAC vs [ ]  RCS    Past Surgical History:  Procedure Laterality Date   CESAREAN SECTION     FOOT SURGERY     FOOT SURGERY      Family History  Problem Relation Age of Onset   Diabetes Mother    Hyperlipidemia Mother    Hypertension Mother    Heart disease Father    Hypertension Father     Social History   Tobacco Use   Smoking status: Former Smoker    Packs/day: 0.30    Types: Cigarettes    Quit date: 03/13/2018    Years since quitting: 2.1   Smokeless tobacco: Never Used  Substance Use Topics   Alcohol use: Not Currently    Comment: last drink 04/21/2018   Drug use: No    Allergies: No Known Allergies  Medications Prior to Admission  Medication Sig Dispense Refill Last Dose   acetaminophen (TYLENOL) 500 MG tablet Take 1,000 mg by mouth every 6 (six) hours as needed.   04/23/2020 at Unknown time   Prenatal MV & Min w/FA-DHA (PRENATAL ADULT GUMMY/DHA/FA) 0.4-25 MG CHEW Chew 1 Dose by mouth daily. 90 tablet 2 04/24/2020 at Unknown time   betamethasone dipropionate (DIPROLENE) 0.05 % ointment Apply topically 2 (two) times daily. 30 g 0     clotrimazole-betamethasone (LOTRISONE) cream Apply to affected area 2 times daily x 2 weeks 45 g 0     Review of Systems  Gastrointestinal: Negative for abdominal pain.  Genitourinary: Positive for vaginal bleeding.   Physical Exam   Blood pressure (!) 106/59, pulse 79, temperature 98 F (36.7 C), temperature source Oral, resp. rate 16, height 5\' 4"  (1.626 m), weight 53.3 kg, last menstrual period 03/07/2020, SpO2 100 %.  Physical Exam Vitals and nursing note reviewed. Exam conducted with a chaperone present.  Constitutional:      General: She is not in acute distress. HENT:     Head: Normocephalic and atraumatic.  Cardiovascular:     Rate and Rhythm: Normal rate.  Pulmonary:     Effort: Pulmonary effort is normal. No respiratory distress.  Genitourinary:    Comments: External: no lesions or erythema Vagina: rugated, pink, moist, scant brown bloody discharge Cervix closed  Musculoskeletal:        General: Normal range of motion.  Skin:    General: Skin is warm and dry.  Neurological:     General: No focal deficit present.     Mental Status: She is alert and oriented to person, place, and time.  Psychiatric:        Mood and Affect: Mood normal.    Results for orders placed or performed during the hospital encounter of 04/24/20 (from the past 24 hour(s))  Urinalysis, Routine w reflex microscopic     Status: Abnormal   Collection Time: 04/24/20  4:57 PM  Result Value Ref Range   Color, Urine YELLOW YELLOW   APPearance HAZY (A) CLEAR   Specific Gravity, Urine 1.017 1.005 - 1.030   pH 7.0 5.0 - 8.0   Glucose, UA NEGATIVE NEGATIVE mg/dL   Hgb urine dipstick NEGATIVE NEGATIVE   Bilirubin Urine NEGATIVE NEGATIVE   Ketones, ur NEGATIVE NEGATIVE mg/dL   Protein, ur NEGATIVE NEGATIVE mg/dL   Nitrite NEGATIVE NEGATIVE   Leukocytes,Ua NEGATIVE NEGATIVE   US OB Transvaginal  Result Date: 04/24/2020 CLINICAL DATA:  Vaginal bleeding. EXAM: OBSTETRIC <14 WK [place] AND TRANSVAGINAL  OB US TECHNIQUE: Both transabdominal and transvaginal ultrasound examinations were performed for complete evaluation of the gestation as well as the maternal uterus, adnexal regions, and pelvic cul-de-sac. Transvaginal technique was performed to assess early pregnancy. COMPARISON:  April 20, 2020 FINDINGS: Intrauterine gestational sac: Single Yolk sac:  Visualized. Embryo:  Visualized. Cardiac Activity: Visualized. Heart Rate: 146 bpm CRL:  11.4 mm   7 w   1 d                  [place] EDC: December 10, 2020 Subchorionic hemorrhage:  None visualized. Maternal uterus/adnexae: The right ovary measures 2.1 cm x 1.4 cm x 3.4 cm and is normal in appearance. The left ovary measures 4.1 cm x 2.0 cm x 2.5 cm and contains a small corpus luteum cyst. A 1.7 cm x 1.0 cm x 1.2 cm area of heterogeneous hyperechogenicity is seen within the region of the cervix. This is seen on the prior study. IMPRESSION: 1. Single, viable intrauterine pregnancy at approximately 7 weeks and 1 day gestation by ultrasound evaluation. 2. Small stable hyperechoic mass within the region of the cervix. Electronically Signed   By: Virgina Norfolk M.D.   On: 04/24/2020 18:24   MAU Course  Procedures  MDM Labs and [place] ordered and reviewed. Viable IUP confirmed. No [place]. Cultures collected at office. Cervical mass seen again on [place], discussed with Dr. Harolyn Rutherford, ok for outpt follow up. Stable for discharge home.   Assessment and Plan   1. Normal intrauterine pregnancy on prenatal ultrasound in first trimester   2. Blood type, Rh positive    Discharge home [facility] as scheduled SAB precautions  Allergies as of 04/24/2020   No Known Allergies     Medication List    STOP taking these medications   betamethasone dipropionate 0.05 % ointment Commonly known as: DIPROLENE   clotrimazole-betamethasone cream Commonly known as: LOTRISONE     TAKE these medications   acetaminophen 500 MG tablet Commonly known as: TYLENOL Take 1,000  mg by mouth every 6 (six) hours as needed.   Prenatal Adult Gummy/DHA/FA 0.4-25 MG Chew Chew 1 Dose by mouth daily.      Julianne Handler, CNM 04/24/2020, 6:37 PM

## 2020-04-24 NOTE — Progress Notes (Addendum)
    SUBJECTIVE:   CHIEF COMPLAINT / HPI:   Pam Vargas is a 39 y.o. female [phone]  Approximately 6 weeks who presents with vaginal bump and itching.  Duration: 2 days They were painful, but they are no longer painful.  She also noticed Brown/red discharge, once this morning.  Endorses palpitations.  Sexually active, does not use barrier protection No h/o STI or genital lesions Has not shaved in the area.   ROS: no f/v, No CP. No SOB. No stomach pain.   PERTINENT  PMH / PSH: Cervical mass found on recent pelvic ultrasound. Patient has a referral placed to OB/GYN.   OBJECTIVE:   BP 100/60   Pulse 77   Ht 5\' 4"  (1.626 m)   Wt 119 lb 2 oz (54 kg)   LMP 03/07/2020   SpO2 99%   BMI 20.45 kg/m    Gen: NAD, resting comfortably CV: RRR with no murmurs appreciated Pulm: NWOB, CTAB with no crackles, wheezes, or rhonchi GI: Soft, Nontender, Nondistended. GU: there is a small lesion at 12:00 that is next to the clitoris, there is no purulent discharge, it is nontender.  There is another lesion at 2:00, it is in the labia minora, it is consistent with a bartholin gland cyst, it is also nontender, about 1 cm in diameter There is no CMT, there is brownish discharge and what appears to be red blood in the vaginal vault There are no adnexal masses palpated on bimanual exam MSK: no edema, cyanosis, or clubbing noted Skin: warm, dry Neuro: grossly normal, moves all extremities Psych: Normal affect and thought content   ASSESSMENT/PLAN:   Vaginal lesion Painless. Will test for HSV w/ culture and Syphilis w/ RPR.   Vaginal bleeding affecting early pregnancy Brownish discharge w/ red blood. Likely a threatened abortion. Patient is hemodynamically stable. Patient Rh+, does not need Rhogam.   - advised patient to the MAU. She states that she will go immediately.  -Will also get GC/Chlamydia, wet prep.  - HIV - Precepted w/ Dr. Owens Shark    Patient advised about potential loss of  fetus and severe consequences if vaginal bleeding worsened.   Bonnita Hollow, MD [place]

## 2020-04-25 LAB — SPECIMEN STATUS

## 2020-04-27 ENCOUNTER — Telehealth: Payer: Self-pay

## 2020-04-27 DIAGNOSIS — O209 Hemorrhage in early pregnancy, unspecified: Secondary | ICD-10-CM | POA: Insufficient documentation

## 2020-04-27 LAB — SPECIMEN STATUS REPORT

## 2020-04-27 LAB — HERPES SIMPLEX VIRUS CULTURE

## 2020-04-27 NOTE — Assessment & Plan Note (Signed)
Brownish discharge w/ red blood. Likely a threatened abortion. Patient is hemodynamically stable. Patient Rh+, does not need Rhogam.   - advised patient to the MAU. She states that she will go immediately.  -Will also get GC/Chlamydia, wet prep.  - HIV - Precepted w/ Dr. Owens Shark

## 2020-04-27 NOTE — Telephone Encounter (Signed)
No cbc was ordered. All patient's labs ordered have resulted. Unsure what the tube is.

## 2020-04-27 NOTE — Telephone Encounter (Signed)
[facility] calling nurse line stating they received a lavender top sample, however no order. I am thinking its for a cbc? Please advise. I can call them with the verbal.   They did receive RPR, HIV, HSV

## 2020-04-27 NOTE — Assessment & Plan Note (Signed)
Painless. Will test for HSV w/ culture and Syphilis w/ RPR.

## 2020-04-28 LAB — SPECIMEN STATUS REPORT

## 2020-04-28 LAB — CERVICOVAGINAL ANCILLARY ONLY
Chlamydia: NEGATIVE
Comment: NEGATIVE
Comment: NORMAL
Neisseria Gonorrhea: NEGATIVE

## 2020-04-28 LAB — HIV ANTIBODY (ROUTINE TESTING W REFLEX): HIV Screen 4th Generation wRfx: NONREACTIVE

## 2020-04-28 LAB — RPR: RPR Ser Ql: NONREACTIVE

## 2020-04-30 DIAGNOSIS — N888 Other specified noninflammatory disorders of cervix uteri: Secondary | ICD-10-CM

## 2020-04-30 HISTORY — DX: Other specified noninflammatory disorders of cervix uteri: N88.8

## 2020-05-02 ENCOUNTER — Other Ambulatory Visit: Payer: Self-pay

## 2020-05-02 ENCOUNTER — Inpatient Hospital Stay (HOSPITAL_COMMUNITY)
Admission: AD | Admit: 2020-05-02 | Discharge: 2020-05-02 | Disposition: A | Discharge status: Home / Self Care | Payer: Medicaid Other | Attending: Obstetrics & Gynecology | Admitting: Obstetrics & Gynecology

## 2020-05-02 ENCOUNTER — Encounter (HOSPITAL_COMMUNITY): Payer: Self-pay | Admitting: Obstetrics & Gynecology

## 2020-05-02 DIAGNOSIS — O23591 Infection of other part of genital tract in pregnancy, first trimester: Secondary | ICD-10-CM | POA: Diagnosis not present

## 2020-05-02 DIAGNOSIS — O99891 Other specified diseases and conditions complicating pregnancy: Secondary | ICD-10-CM | POA: Diagnosis not present

## 2020-05-02 DIAGNOSIS — O209 Hemorrhage in early pregnancy, unspecified: Secondary | ICD-10-CM | POA: Diagnosis not present

## 2020-05-02 DIAGNOSIS — Z3A08 8 weeks gestation of pregnancy: Secondary | ICD-10-CM | POA: Diagnosis not present

## 2020-05-02 DIAGNOSIS — D260 Other benign neoplasm of cervix uteri: Secondary | ICD-10-CM | POA: Diagnosis not present

## 2020-05-02 DIAGNOSIS — Z87891 Personal history of nicotine dependence: Secondary | ICD-10-CM | POA: Insufficient documentation

## 2020-05-02 DIAGNOSIS — M542 Cervicalgia: Secondary | ICD-10-CM | POA: Insufficient documentation

## 2020-05-02 DIAGNOSIS — O26891 Other specified pregnancy related conditions, first trimester: Secondary | ICD-10-CM | POA: Diagnosis not present

## 2020-05-02 DIAGNOSIS — Z98891 History of uterine scar from previous surgery: Secondary | ICD-10-CM | POA: Insufficient documentation

## 2020-05-02 DIAGNOSIS — N898 Other specified noninflammatory disorders of vagina: Secondary | ICD-10-CM

## 2020-05-02 DIAGNOSIS — O09522 Supervision of elderly multigravida, second trimester: Secondary | ICD-10-CM | POA: Diagnosis not present

## 2020-05-02 DIAGNOSIS — O4691 Antepartum hemorrhage, unspecified, first trimester: Secondary | ICD-10-CM

## 2020-05-02 DIAGNOSIS — O09211 Supervision of pregnancy with history of pre-term labor, first trimester: Secondary | ICD-10-CM | POA: Diagnosis not present

## 2020-05-02 DIAGNOSIS — B9689 Other specified bacterial agents as the cause of diseases classified elsewhere: Secondary | ICD-10-CM | POA: Insufficient documentation

## 2020-05-02 DIAGNOSIS — Z8759 Personal history of other complications of pregnancy, childbirth and the puerperium: Secondary | ICD-10-CM | POA: Insufficient documentation

## 2020-05-02 DIAGNOSIS — R1031 Right lower quadrant pain: Secondary | ICD-10-CM

## 2020-05-02 LAB — URINALYSIS, ROUTINE W REFLEX MICROSCOPIC
Bilirubin Urine: NEGATIVE
Glucose, UA: NEGATIVE mg/dL
Hgb urine dipstick: NEGATIVE
Ketones, ur: NEGATIVE mg/dL
Leukocytes,Ua: NEGATIVE
Nitrite: NEGATIVE
Protein, ur: NEGATIVE mg/dL
Specific Gravity, Urine: 1.004 — ABNORMAL LOW (ref 1.005–1.030)
pH: 7 (ref 5.0–8.0)

## 2020-05-02 LAB — WET PREP, GENITAL
Sperm: NONE SEEN
Trich, Wet Prep: NONE SEEN
Yeast Wet Prep HPF POC: NONE SEEN

## 2020-05-02 MED ORDER — CYCLOBENZAPRINE HCL 5 MG PO TABS: 5.0000 mg | ORAL_TABLET | Freq: Three times a day (TID) | ORAL | 0 refills | Status: DC | PRN

## 2020-05-02 MED ORDER — METRONIDAZOLE 500 MG PO TABS: 500.0000 mg | ORAL_TABLET | Freq: Two times a day (BID) | ORAL | 0 refills | Status: AC

## 2020-05-02 MED ORDER — CYCLOBENZAPRINE HCL 5 MG PO TABS
5.0000 mg | ORAL_TABLET | Freq: Once | ORAL | Status: AC
  Administered 2020-05-02: 5 mg via ORAL
  Filled 2020-05-02: qty 1

## 2020-05-02 NOTE — MAU Note (Signed)
Pam Vargas is a 39 y.o. at [redacted]w[redacted]d here in MAU reporting: woke up with sharp pain in her neck yesterday. Has been noticing some white discharge when she uses the bathroom- drops into the toilet. Also having a sharp pain in her right abdomen. Notices some brown when she wipes.  Onset of complaint: yesterday  Pain score: 5/10  Vitals:   05/02/20 0830  BP: 107/65  Pulse: 87  Resp: 16  Temp: 98.3 F (36.8 C)  SpO2: 100%     Lab orders placed from triage: UA

## 2020-05-02 NOTE — MAU Provider Note (Signed)
Chief Complaint: Abdominal Pain, Vaginal Discharge, Vaginal Bleeding, and Neck Pain   First Provider Initiated Contact with Patient 05/02/20 0916      SUBJECTIVE HPI: Pam Vargas is a 39 y.o. [id number] at [redacted]w[redacted]d by LMP, c/w early [place] who presents to maternity admissions reporting white vaginal discharge, right lower quadrant pain, neck pain, and vaginal bleeding.  The vaginal bleeding is brown spotting when wiping, which is unchanged since MAU visit 04/24/20. She has known cervical mass causing light bleeding. he pain in her right lower abdomen is intermittent, mild, and not present currently in MAU. She woke up with neck pain this morning on the right side of her neck and down in to her right upper back. She has taken Tylenol which has not helped. She also noticed the white vaginal discharge today. There is no itching, irritation, or odor.       Neck pain: Location: neck  Quality: aching Severity: 5/10 on pain scale Duration: 1 day Timing: intermittent Modifying factors: Tylenol has not helped Associated signs and symptoms: none  RLQ pain Location: RLQ abdomen Quality: cramping Severity: 5/10 on pain scale Duration: 1 day Timing: intermittent Modifying factors: Tylenol, pain not present in MAU Associated signs and symptoms: vaginal bleeding, unchanged in 2 weeks   HPI  Past Medical History:  Diagnosis Date  . History of preterm delivery 09/12/2016   25 week VBAC secondary to SOL- [ ] Ask about17-P  . Preterm labor   . Previous cesarean section 09/12/2016   Arrest of descent Desires [ ]  TOLAC vs [ ]  RCS   Past Surgical History:  Procedure Laterality Date  . CESAREAN SECTION    . FOOT SURGERY    . FOOT SURGERY     Social History   Socioeconomic History  . Marital status: Married    Spouse name: Not on file  . Number of children: Not on file  . Years of education: Not on file  . Highest education level: Not on file  Occupational History  . Not on file  Tobacco Use  .  Smoking status: Former Smoker    Packs/day: 0.30    Types: Cigarettes    Quit date: 03/13/2018    Years since quitting: 2.1  . Smokeless tobacco: Never Used  Substance and Sexual Activity  . Alcohol use: Not Currently    Comment: last drink 04/21/2018  . Drug use: No  . Sexual activity: Not Currently    Partners: Male    Birth control/protection: None  Other Topics Concern  . Not on file  Social History Narrative   Works at [employer] as a Chartered certified accountant    Social Determinants of Radio broadcast assistant Strain:   . Difficulty of Paying Living Expenses:   Food Insecurity:   . Worried About Charity fundraiser in the Last Year:   . Arboriculturist in the Last Year:   Transportation Needs:   . Film/video editor (Medical):   Marland Kitchen Lack of Transportation (Non-Medical):   Physical Activity:   . Days of Exercise per Week:   . Minutes of Exercise per Session:   Stress:   . Feeling of Stress :   Social Connections:   . Frequency of Communication with Friends and Family:   . Frequency of Social Gatherings with Friends and Family:   . Attends Religious Services:   . Active Member of Clubs or Organizations:   . Attends Archivist Meetings:   Marland Kitchen Marital  Status:   Intimate Partner Violence:   . Fear of Current or Ex-Partner:   . Emotionally Abused:   Marland Kitchen Physically Abused:   . Sexually Abused:    No current facility-administered medications on file prior to encounter.   Current Outpatient Medications on File Prior to Encounter  Medication Sig Dispense Refill  . acetaminophen (TYLENOL) 500 MG tablet Take 1,000 mg by mouth every 6 (six) hours as needed.    . Prenatal MV & Min w/FA-DHA (PRENATAL ADULT GUMMY/DHA/FA) 0.4-25 MG CHEW Chew 1 Dose by mouth daily. 90 tablet 2   No Known Allergies  ROS:  Review of Systems  Constitutional: Negative for chills, fatigue and fever.  Respiratory: Negative for shortness of breath.   Cardiovascular: Negative for chest pain.   Gastrointestinal: Positive for abdominal pain. Negative for constipation, nausea and vomiting.  Genitourinary: Positive for vaginal bleeding. Negative for difficulty urinating, dysuria, flank pain, pelvic pain, vaginal discharge and vaginal pain.  Musculoskeletal: Positive for myalgias.  Neurological: Negative for dizziness and headaches.  Psychiatric/Behavioral: Negative.      I have reviewed patient's Past Medical Hx, Surgical Hx, Family Hx, Social Hx, medications and allergies.   Physical Exam   Patient Vitals for the past 24 hrs:  BP Temp Temp src Pulse Resp SpO2 Height Weight  05/02/20 0830 107/65 98.3 F (36.8 C) Oral 87 16 100 % -- --  05/02/20 0826 -- -- -- -- -- -- 5\' 4"  (1.626 m) 53.9 kg   Constitutional: Well-developed, well-nourished female in no acute distress.  Cardiovascular: normal rate Respiratory: normal effort GI: Abd soft, non-tender. Pos BS x 4 MS: Extremities nontender, no edema, normal ROM Neurologic: Alert and oriented x 4.  GU: Neg CVAT.  PELVIC EXAM: Cervix pink, visually closed, without small mass inside cervical canal that is flesh toned on surface but darker beefy red further in on inspection, scant brown discharge, vaginal walls and external genitalia normal   LAB RESULTS Results for orders placed or performed during the hospital encounter of 05/02/20 (from the past 24 hour(s))  Urinalysis, Routine w reflex microscopic     Status: Abnormal   Collection Time: 05/02/20  8:39 AM  Result Value Ref Range   Color, Urine STRAW (A) YELLOW   APPearance CLEAR CLEAR   Specific Gravity, Urine 1.004 (L) 1.005 - 1.030   pH 7.0 5.0 - 8.0   Glucose, UA NEGATIVE NEGATIVE mg/dL   Hgb urine dipstick NEGATIVE NEGATIVE   Bilirubin Urine NEGATIVE NEGATIVE   Ketones, ur NEGATIVE NEGATIVE mg/dL   Protein, ur NEGATIVE NEGATIVE mg/dL   Nitrite NEGATIVE NEGATIVE   Leukocytes,Ua NEGATIVE NEGATIVE  Wet prep, genital     Status: Abnormal   Collection Time: 05/02/20  10:09 AM   Specimen: Genital  Result Value Ref Range   Yeast Wet Prep HPF POC NONE SEEN NONE SEEN   Trich, Wet Prep NONE SEEN NONE SEEN   Clue Cells Wet Prep HPF POC PRESENT (A) NONE SEEN   WBC, Wet Prep HPF POC MANY (A) NONE SEEN   Sperm NONE SEEN     O/Positive/-- (06/14 0924)  IMAGING  Limited OB US Date: 05/02/20 EDD : 12/12/20 based on LMP, c/w early [place] Viability:  FHT detected CRL measurement c/w previous dates   Pt informed that the ultrasound is considered a limited OB ultrasound and is not intended to be a complete ultrasound exam.  Patient also informed that the ultrasound is not being completed with the intent of assessing for fetal or  placental anomalies or any pelvic abnormalities.  Explained that the purpose of today's ultrasound is to assess for  viability.  Patient acknowledges the purpose of the exam and the limitations of the study.     US OB Transvaginal  Result Date: 04/24/2020 CLINICAL DATA:  Vaginal bleeding. EXAM: OBSTETRIC <14 WK [place] AND TRANSVAGINAL OB US TECHNIQUE: Both transabdominal and transvaginal ultrasound examinations were performed for complete evaluation of the gestation as well as the maternal uterus, adnexal regions, and pelvic cul-de-sac. Transvaginal technique was performed to assess early pregnancy. COMPARISON:  April 20, 2020 FINDINGS: Intrauterine gestational sac: Single Yolk sac:  Visualized. Embryo:  Visualized. Cardiac Activity: Visualized. Heart Rate: 146 bpm CRL:  11.4 mm   7 w   1 d                  [place] EDC: December 10, 2020 Subchorionic hemorrhage:  None visualized. Maternal uterus/adnexae: The right ovary measures 2.1 cm x 1.4 cm x 3.4 cm and is normal in appearance. The left ovary measures 4.1 cm x 2.0 cm x 2.5 cm and contains a small corpus luteum cyst. A 1.7 cm x 1.0 cm x 1.2 cm area of heterogeneous hyperechogenicity is seen within the region of the cervix. This is seen on the prior study. IMPRESSION: 1. Single, viable intrauterine pregnancy  at approximately 7 weeks and 1 day gestation by ultrasound evaluation. 2. Small stable hyperechoic mass within the region of the cervix. Electronically Signed   By: Virgina Norfolk M.D.   On: 04/24/2020 18:24   US OB LESS THAN 14 WEEKS WITH OB TRANSVAGINAL  Result Date: 04/20/2020 CLINICAL DATA:  At positive pregnancy test, incidental pregnancy, LMP 03/07/2020 EXAM: OBSTETRIC <14 WK [place] AND TRANSVAGINAL OB US TECHNIQUE: Both transabdominal and transvaginal ultrasound examinations were performed for complete evaluation of the gestation as well as the maternal uterus, adnexal regions, and pelvic cul-de-sac. Transvaginal technique was performed to assess early pregnancy. COMPARISON:  None for this gestation; 04/26/2018 FINDINGS: Intrauterine gestational sac: Present, single Yolk sac:  Present Embryo:  Present Cardiac Activity: Present Heart Rate: 117 bpm CRL:  2.6 mm   5 w   6 d                  [place] EDC: 12/15/2020 Subchorionic hemorrhage:  None visualized. Maternal uterus/adnexae: Maternal uterus otherwise unremarkable. Hyperechoic focus within the body of the cervix 18 x 12 x 15 mm, without internal blood flow on color Doppler imaging, nonspecific; this could represent a complicated Nabothian cyst or a cervical mass/tumor. This was not definitely identified on the prior exam. RIGHT ovary normal size and morphology, 2.6 x 1.6 x 2.0 cm. LEFT ovary measures 3.2 x 2.0 x 2.6 cm and contains a corpus luteum. Moderate amount of free pelvic fluid. No adnexal masses. IMPRESSION: Single live intrauterine gestation at 5 weeks 6 days EGA by crown-rump length. Hyperechoic mass within body of cervix, 18 mm greatest size question complicated Nabothian cyst or a cervical mass/tumor; recommend characterization by MRI (noncontrast in the setting of early pregnancy). These results will be called to the ordering clinician or representative by the Radiologist Assistant, and communication documented in the PACS or [employer].  Electronically Signed   By: Lavonia Dana M.D.   On: 04/20/2020 13:59    MAU Management/MDM: Orders Placed This Encounter  Procedures  . Wet prep, genital  . Urinalysis, Routine w reflex microscopic  . Discharge patient    Meds ordered this encounter  Medications  .  cyclobenzaprine (FLEXERIL) tablet 5 mg  . cyclobenzaprine (FLEXERIL) 5 MG tablet    Sig: Take 1 tablet (5 mg total) by mouth 3 (three) times daily as needed for muscle spasms.    Dispense:  20 tablet    Refill:  0    Order Specific Question:   Supervising Provider    Answer:   Woodroe Mode [[id number]]  . metroNIDAZOLE (FLAGYL) 500 MG tablet    Sig: Take 1 tablet (500 mg total) by mouth 2 (two) times daily for 7 days.    Dispense:  14 tablet    Refill:  0    Order Specific Question:   Supervising Provider    Answer:   Woodroe Mode [[id number]]    Bedside US reveals live IUP c/w previous dates.  UA without evidence of UTI, pt abdominal pain not present in MAU.  Pelvic exam unchanged with minimal bleeding, no abnormal discharge noted. Mass inside cervical os is possibly cervical polyp but not clear. Likely benign. Wet prep with clue cells. Given pt report of discharge and pain, will treat for BV with Flagyl BID x 7 days.   Neck pain is c/w musculoskeletal pain.  Flexeril 5 mg PO given in MAU, Rx for Flexeril PRN.  Rest/heat/ice/Tylenol/Flexeril for pain. Reasons to seek care reviewed.  F/U at [facility] as scheduled.  Pt discharged with strict return precautions.  ASSESSMENT 1. Vaginal discharge during pregnancy in first trimester   2. Vaginal bleeding affecting early pregnancy   3. Neck pain on right side   4. Abdominal pain during pregnancy in first trimester   5. Benign neoplasm of uterine cervix   6. Bacterial vaginosis     PLAN Discharge home Allergies as of 05/02/2020   No Known Allergies     Medication List    TAKE these medications   acetaminophen 500 MG tablet Commonly known as: TYLENOL Take 1,000 mg by mouth  every 6 (six) hours as needed.   cyclobenzaprine 5 MG tablet Commonly known as: FLEXERIL Take 1 tablet (5 mg total) by mouth 3 (three) times daily as needed for muscle spasms.   metroNIDAZOLE 500 MG tablet Commonly known as: FLAGYL Take 1 tablet (500 mg total) by mouth 2 (two) times daily for 7 days.   Prenatal Adult Gummy/DHA/FA 0.4-25 MG Chew Chew 1 Dose by mouth daily.       Follow-up [place] [facility] Follow up.   Specialty: Obstetrics and Gynecology Why: As scheduled, return to MAU as needed for emergencies. Contact information: [address] [place] [address] [place] Certified Nurse-Midwife 05/02/2020  10:34 AM

## 2020-05-05 ENCOUNTER — Encounter (HOSPITAL_COMMUNITY): Payer: Self-pay | Admitting: Obstetrics and Gynecology

## 2020-05-05 ENCOUNTER — Inpatient Hospital Stay (HOSPITAL_COMMUNITY)
Admission: AD | Admit: 2020-05-05 | Discharge: 2020-05-05 | Disposition: A | Discharge status: Home / Self Care | Payer: Medicaid Other | Attending: Obstetrics and Gynecology | Admitting: Obstetrics and Gynecology

## 2020-05-05 ENCOUNTER — Other Ambulatory Visit: Payer: Self-pay

## 2020-05-05 DIAGNOSIS — O09211 Supervision of pregnancy with history of pre-term labor, first trimester: Secondary | ICD-10-CM | POA: Diagnosis not present

## 2020-05-05 DIAGNOSIS — O99511 Diseases of the respiratory system complicating pregnancy, first trimester: Secondary | ICD-10-CM | POA: Diagnosis not present

## 2020-05-05 DIAGNOSIS — Z3A01 Less than 8 weeks gestation of pregnancy: Secondary | ICD-10-CM

## 2020-05-05 DIAGNOSIS — R109 Unspecified abdominal pain: Secondary | ICD-10-CM | POA: Diagnosis not present

## 2020-05-05 DIAGNOSIS — J392 Other diseases of pharynx: Secondary | ICD-10-CM | POA: Diagnosis not present

## 2020-05-05 DIAGNOSIS — Z98891 History of uterine scar from previous surgery: Secondary | ICD-10-CM | POA: Diagnosis not present

## 2020-05-05 DIAGNOSIS — F17210 Nicotine dependence, cigarettes, uncomplicated: Secondary | ICD-10-CM | POA: Insufficient documentation

## 2020-05-05 DIAGNOSIS — Z79899 Other long term (current) drug therapy: Secondary | ICD-10-CM | POA: Insufficient documentation

## 2020-05-05 DIAGNOSIS — O26891 Other specified pregnancy related conditions, first trimester: Secondary | ICD-10-CM | POA: Diagnosis not present

## 2020-05-05 DIAGNOSIS — Z3A08 8 weeks gestation of pregnancy: Secondary | ICD-10-CM | POA: Diagnosis not present

## 2020-05-05 DIAGNOSIS — O99331 Smoking (tobacco) complicating pregnancy, first trimester: Secondary | ICD-10-CM

## 2020-05-05 DIAGNOSIS — O09521 Supervision of elderly multigravida, first trimester: Secondary | ICD-10-CM | POA: Insufficient documentation

## 2020-05-05 LAB — URINALYSIS, ROUTINE W REFLEX MICROSCOPIC
Bilirubin Urine: NEGATIVE
Glucose, UA: NEGATIVE mg/dL
Hgb urine dipstick: NEGATIVE
Ketones, ur: NEGATIVE mg/dL
Leukocytes,Ua: NEGATIVE
Nitrite: NEGATIVE
Protein, ur: NEGATIVE mg/dL
Specific Gravity, Urine: 1.008 (ref 1.005–1.030)
pH: 6 (ref 5.0–8.0)

## 2020-05-05 MED ORDER — MENTHOL 3 MG MT LOZG
1.0000 | LOZENGE | Freq: Once | OROMUCOSAL | Status: AC
  Administered 2020-05-05: 3 mg via ORAL
  Filled 2020-05-05: qty 9

## 2020-05-05 NOTE — MAU Provider Note (Signed)
History     CSN: [id number]  Arrival date and time: 05/05/20 0941   First Provider Initiated Contact with Patient 05/05/20 1103      Chief Complaint  Patient presents with  . Abdominal Pain   Pam Vargas is a 39 y.o. [id number] at [redacted]w[redacted]d who receives care at [facility].  She presents today for throat dryness.  Patient states she has been drinking lots of water but her throat is dry.  Patient reports she drinks 2-3 bottles of water daily.  Patient states her symptoms started yesterday and is just dry.  Patient states she has not experienced this in the past.  Patient denies a history of allergies and has not been in contact with sick individuals.  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  Patient denies abnormal vaginal discharge or bleeding.  She also denies pain or difficulty with urination.  Patient is current smoker and states she smokes ~2 cigarettes a day.      OB History    Gravida  6   Para  3   Term  1   Preterm  1   AB  2   Living  2     SAB  2   TAB  0   Ectopic  0   Multiple  0   Live Births  2           Past Medical History:  Diagnosis Date  . History of preterm delivery 09/12/2016   25 week VBAC secondary to SOL- [ ] Ask about17-P  . Preterm labor   . Previous cesarean section 09/12/2016   Arrest of descent Desires [ ]  TOLAC vs [ ]  RCS    Past Surgical History:  Procedure Laterality Date  . CESAREAN SECTION    . FOOT SURGERY    . FOOT SURGERY      Family History  Problem Relation Age of Onset  . Diabetes Mother   . Hyperlipidemia Mother   . Hypertension Mother   . Heart disease Father   . Hypertension Father     Social History   Tobacco Use  . Smoking status: Former Smoker    Packs/day: 0.30    Types: Cigarettes    Quit date: 03/13/2018    Years since quitting: 2.1  . Smokeless tobacco: Never Used  Vaping Use  . Vaping Use: Never used  Substance Use Topics  . Alcohol use: Not Currently    Comment: last drink 04/21/2018  .  Drug use: No    Allergies: No Known Allergies  Medications Prior to Admission  Medication Sig Dispense Refill Last Dose  . cyclobenzaprine (FLEXERIL) 5 MG tablet Take 1 tablet (5 mg total) by mouth 3 (three) times daily as needed for muscle spasms. 20 tablet 0 05/04/2020 at 2200  . metroNIDAZOLE (FLAGYL) 500 MG tablet Take 1 tablet (500 mg total) by mouth 2 (two) times daily for 7 days. 14 tablet 0 05/04/2020 at 2200  . Prenatal Vit-Fe Phos-FA-Omega (VITAFOL GUMMIES) 3.33-0.333-34.8 MG CHEW Chew 3 tablets by mouth daily.   05/05/2020 at Unknown time  . Prenatal MV & Min w/FA-DHA (PRENATAL ADULT GUMMY/DHA/FA) 0.4-25 MG CHEW Chew 1 Dose by mouth daily. (Patient not taking: Reported on 05/05/2020) 90 tablet 2 Not Taking at Unknown time    Review of Systems  Constitutional: Negative for chills and fever.  HENT: Positive for sore throat. Negative for congestion, sinus pressure, sinus pain and trouble swallowing.   Respiratory: Negative for cough and shortness  of breath.   Gastrointestinal: Negative for nausea and vomiting.  Genitourinary: Negative for difficulty urinating, dysuria, vaginal bleeding and vaginal discharge.  Neurological: Negative for dizziness, light-headedness and headaches.   Physical Exam   Blood pressure (!) 106/57, pulse 89, temperature 98.1 F (36.7 C), temperature source Oral, resp. rate 18, height 5\' 4"  (1.626 m), weight 52.7 kg, last menstrual period 03/07/2020, SpO2 100 %.  Physical Exam Vitals and nursing note reviewed.  Constitutional:      Appearance: Normal appearance. She is well-developed.  HENT:     Head: Normocephalic and atraumatic.     Mouth/Throat:     Mouth: Mucous membranes are moist. No lacerations.     Tongue: No lesions.     Palate: No mass and lesions.     Pharynx: Oropharynx is clear. No oropharyngeal exudate, posterior oropharyngeal erythema or uvula swelling.     Tonsils: No tonsillar exudate or tonsillar abscesses.  Eyes:     Conjunctiva/sclera:  Conjunctivae normal.  Cardiovascular:     Rate and Rhythm: Normal rate and regular rhythm.     Heart sounds: Normal heart sounds.  Pulmonary:     Effort: Pulmonary effort is normal. No respiratory distress.     Breath sounds: Normal breath sounds.  Abdominal:     General: Bowel sounds are normal.     Tenderness: There is no abdominal tenderness.  Skin:    General: Skin is warm and dry.  Neurological:     Mental Status: She is alert and oriented to person, place, and time.  Psychiatric:        Mood and Affect: Mood normal.        Thought Content: Thought content normal.        Judgment: Judgment normal.     MAU Course  Procedures Results for orders placed or performed during the hospital encounter of 05/05/20 (from the past 24 hour(s))  Urinalysis, Routine w reflex microscopic     Status: None   Collection Time: 05/05/20 10:49 AM  Result Value Ref Range   Color, Urine YELLOW YELLOW   APPearance CLEAR CLEAR   Specific Gravity, Urine 1.008 1.005 - 1.030   pH 6.0 5.0 - 8.0   Glucose, UA NEGATIVE NEGATIVE mg/dL   Hgb urine dipstick NEGATIVE NEGATIVE   Bilirubin Urine NEGATIVE NEGATIVE   Ketones, ur NEGATIVE NEGATIVE mg/dL   Protein, ur NEGATIVE NEGATIVE mg/dL   Nitrite NEGATIVE NEGATIVE   Leukocytes,Ua NEGATIVE NEGATIVE    MDM Education Lozenge Oral Hydration Assessment and Plan  39 year old [id number] at 8.3 weeks Throat Dryness Smoker  -Educated on how smoking and lack of oral intake can contribute to symptoms. -Discussed increased hydration esp during working hours. -Will give throat lozenge and water then reassess.   Maryann Conners 05/05/2020, 11:03 AM   Reassessment (12:07 PM)  -Patient reports throat lozenges have improved dryness. -Discussed usage at home. -Patient without questions or concerns. -Encouraged smoking cessation. -Encouraged to call or return to MAU if symptoms worsen or with the onset of new symptoms. -Discharged to home in stable  condition.  Maryann Conners MSN, CNM Advanced Practice Provider, Center for [facility]

## 2020-05-05 NOTE — MAU Note (Signed)
Presents with lower right abdominal pain that began yesterday, reports pain is sharp & intermittent.  Pt also states she's dehydrated, hasn't been vomiting and drinking plenty of fluids.  States throat is dry.  Denies VB, has brownish discharge with wiping.  Seen in MAU on 04/25/20, has BV, started on Flagyl.

## 2020-05-05 NOTE — Discharge Instructions (Signed)
Indigestion Indigestion is a feeling of pain, discomfort, burning, or fullness in the upper part of your abdomen. It can come and go. It may occur frequently or rarely. Indigestion tends to occur while you are eating or right after you have finished eating. It may be worse at night and while bending over or lying down. Indigestion may be a symptom of an underlying digestive condition. Follow these instructions at home: Eating and drinking   Follow an eating plan as recommended by your health care provider.  Avoid certain foods and drinks as told by your health care provider. This may include: ? Chocolate and cocoa. ? Peppermint and mint flavorings. ? Garlic and onions. ? Horseradish. ? Spicy and acidic foods, including peppers, chili powder, curry powder, vinegar, hot sauces, and barbecue sauce. ? Citrus fruits, such as oranges, lemons, and limes. ? Tomato-based foods, such as red sauce, chili, salsa, and pizza with red sauce. ? Fried and fatty foods, such as donuts, french fries, potato chips, and high-fat dressings. ? High-fat meats, such as hot dogs and fatty cuts of red and white meats, such as rib eye steak, sausage, ham, and bacon. ? High-fat dairy items, such as whole milk, butter, and cream cheese. ? Coffee and tea (with or without caffeine). ? Drinks that contain alcohol. ? Energy drinks and sports drinks. ? Carbonated drinks or sodas. ? Citrus fruit juices.  Eat small, frequent meals instead of large meals.  Avoid drinking large amounts of liquid with your meals.  Avoid eating meals during the 2-3 hours before bedtime.  Avoid lying down right after you eat.  Avoid exercise for 2 hours after you eat. Lifestyle      Maintain a healthy weight. Ask your health care provider what weight is healthy for you. If you need to lose weight, work with your health care provider to do so safely.  Exercise for at least 30 minutes on 5 or more days each week, or as told by your  health care provider. Avoid exercises that include bending forward. This can make your symptoms worse.  Wear loose-fitting clothing. Do not wear anything tight around your waist that causes pressure on your abdomen.  Do not use any products that contain nicotine or tobacco, such as cigarettes, e-cigarettes, and chewing tobacco. These can make symptoms worse. If you need help quitting, ask your health care provider.  Raise (elevate) the head of your bed about 6 inches (15 cm) when you sleep.  Try to reduce your stress, such as with yoga or meditation. If you need help reducing stress, ask your health care provider. General instructions  Take over-the-counter and prescription medicines only as told by your health care provider. Do not take aspirin, ibuprofen, or other NSAIDs unless your health care provider told you to do so.  Pay attention to any changes in your symptoms.  Keep all follow-up visits as told by your health care provider. This is important. Contact a health care provider if:  You have new symptoms.  You have unexplained weight loss.  You have difficulty swallowing, or it hurts to swallow.  Your symptoms do not improve with treatment.  Your symptoms last for more than 2 days.  You have a fever.  You vomit. Get help right away if:  You have pain in your arms, neck, jaw, teeth, or back.  You feel sweaty, dizzy, or light-headed.  You faint.  You have chest pain or shortness of breath.  You cannot stop vomiting, or you vomit  blood.  Your stool is bloody or black.  You have severe pain in your abdomen. These symptoms may represent a serious problem that is an emergency. Do not wait to see if the symptoms will go away. Get medical help right away. Call your local emergency services (911 in the [place] Do not drive yourself to the hospital. Summary  Indigestion is a feeling of pain, discomfort, burning, or fullness in the upper part of your abdomen. It tends to  occur while you are eating or right after you have finished eating.  Follow an eating plan and other lifestyle changes as told by your health care provider.  Take over-the-counter and prescription medicines only as told by your health care provider. Do not take aspirin, ibuprofen, or other NSAIDs unless your health care provider told you to do so.  Contact your health care provider if your symptoms do not get better or they get worse. Some symptoms may represent a serious problem that is an emergency. Do not wait to see if the symptoms will go away. Get medical help right away. This information is not intended to replace advice given to you by your health care provider. Make sure you discuss any questions you have with your health care provider. Document Revised: 03/19/2018 Document Reviewed: 03/19/2018 Elsevier Patient Education  [place]. \ Safe Medications in Pregnancy   Acne: Benzoyl Peroxide Salicylic Acid  Backache/Headache: Tylenol: 2 regular strength every 4 hours OR              2 Extra strength every 6 hours  Colds/Coughs/Allergies: Benadryl (alcohol free) 25 mg every 6 hours as needed Breath right strips Claritin Cepacol throat lozenges Chloraseptic throat spray Cold-Eeze- up to three times per day Cough drops, alcohol free Flonase (by prescription only) Guaifenesin Mucinex Robitussin DM (plain only, alcohol free) Saline nasal spray/drops Sudafed (pseudoephedrine) & Actifed ** use only after [redacted] weeks gestation and if you do not have high blood pressure Tylenol Vicks Vaporub Zinc lozenges Zyrtec   Constipation: Colace Ducolax suppositories Fleet enema Glycerin suppositories Metamucil Milk of magnesia Miralax Senokot Smooth move tea  Diarrhea: Kaopectate Imodium A-D  *NO pepto Bismol  Hemorrhoids: Anusol Anusol HC Preparation H Tucks  Indigestion: Tums Maalox Mylanta Zantac  Pepcid  Insomnia: Benadryl (alcohol free) 25mg  every  6 hours as needed Tylenol PM Unisom, no Gelcaps  Leg Cramps: Tums MagGel  Nausea/Vomiting:  Bonine Dramamine Emetrol Ginger extract Sea bands Meclizine  Nausea medication to take during pregnancy:  Unisom (doxylamine succinate 25 mg tablets) Take one tablet daily at bedtime. If symptoms are not adequately controlled, the dose can be increased to a maximum recommended dose of two tablets daily (1/2 tablet in the morning, 1/2 tablet mid-afternoon and one at bedtime). Vitamin B6 100mg  tablets. Take one tablet twice a day (up to 200 mg per day).  Skin Rashes: Aveeno products Benadryl cream or 25mg  every 6 hours as needed Calamine Lotion 1% cortisone cream  Yeast infection: Gyne-lotrimin 7 Monistat 7  Gum/tooth pain: Anbesol  **If taking multiple medications, please check labels to avoid duplicating the same active ingredients **take medication as directed on the label ** Do not exceed 4000 mg of tylenol in 24 hours **Do not take medications that contain aspirin or ibuprofen

## 2020-05-13 ENCOUNTER — Inpatient Hospital Stay (HOSPITAL_COMMUNITY): Payer: Medicaid Other

## 2020-05-13 ENCOUNTER — Other Ambulatory Visit: Payer: Self-pay

## 2020-05-13 ENCOUNTER — Encounter (HOSPITAL_COMMUNITY): Payer: Self-pay | Admitting: Family Medicine

## 2020-05-13 ENCOUNTER — Inpatient Hospital Stay (HOSPITAL_COMMUNITY)
Admission: AD | Admit: 2020-05-13 | Discharge: 2020-05-13 | Disposition: A | Discharge status: Home / Self Care | Payer: Medicaid Other | Attending: Family Medicine | Admitting: Family Medicine

## 2020-05-13 DIAGNOSIS — Z3A09 9 weeks gestation of pregnancy: Secondary | ICD-10-CM | POA: Diagnosis not present

## 2020-05-13 DIAGNOSIS — O209 Hemorrhage in early pregnancy, unspecified: Secondary | ICD-10-CM

## 2020-05-13 DIAGNOSIS — O418X10 Other specified disorders of amniotic fluid and membranes, first trimester, not applicable or unspecified: Secondary | ICD-10-CM

## 2020-05-13 DIAGNOSIS — N888 Other specified noninflammatory disorders of cervix uteri: Secondary | ICD-10-CM | POA: Diagnosis not present

## 2020-05-13 DIAGNOSIS — O468X1 Other antepartum hemorrhage, first trimester: Secondary | ICD-10-CM

## 2020-05-13 DIAGNOSIS — O3411 Maternal care for benign tumor of corpus uteri, first trimester: Secondary | ICD-10-CM | POA: Diagnosis not present

## 2020-05-13 DIAGNOSIS — Z3A10 10 weeks gestation of pregnancy: Secondary | ICD-10-CM | POA: Diagnosis not present

## 2020-05-13 DIAGNOSIS — O208 Other hemorrhage in early pregnancy: Secondary | ICD-10-CM | POA: Diagnosis not present

## 2020-05-13 DIAGNOSIS — O34219 Maternal care for unspecified type scar from previous cesarean delivery: Secondary | ICD-10-CM | POA: Diagnosis not present

## 2020-05-13 DIAGNOSIS — Z8751 Personal history of pre-term labor: Secondary | ICD-10-CM | POA: Diagnosis not present

## 2020-05-13 DIAGNOSIS — Z87891 Personal history of nicotine dependence: Secondary | ICD-10-CM | POA: Insufficient documentation

## 2020-05-13 LAB — URINALYSIS, ROUTINE W REFLEX MICROSCOPIC
Bilirubin Urine: NEGATIVE
Glucose, UA: NEGATIVE mg/dL
Ketones, ur: NEGATIVE mg/dL
Leukocytes,Ua: NEGATIVE
Nitrite: NEGATIVE
Protein, ur: NEGATIVE mg/dL
Specific Gravity, Urine: 1.012 (ref 1.005–1.030)
pH: 6 (ref 5.0–8.0)

## 2020-05-13 LAB — CBC
HCT: 37.3 % (ref 36.0–46.0)
Hemoglobin: 12.2 g/dL (ref 12.0–15.0)
MCH: 27.9 pg (ref 26.0–34.0)
MCHC: 32.7 g/dL (ref 30.0–36.0)
MCV: 85.4 fL (ref 80.0–100.0)
Platelets: 219 10*3/uL (ref 150–400)
RBC: 4.37 MIL/uL (ref 3.87–5.11)
RDW: 12.6 % (ref 11.5–15.5)
WBC: 8 10*3/uL (ref 4.0–10.5)
nRBC: 0 % (ref 0.0–0.2)

## 2020-05-13 LAB — WET PREP, GENITAL
Clue Cells Wet Prep HPF POC: NONE SEEN
Sperm: NONE SEEN
Trich, Wet Prep: NONE SEEN
Yeast Wet Prep HPF POC: NONE SEEN

## 2020-05-13 NOTE — MAU Note (Signed)
Pt reports to MAU c/o vaginal bleeding that started at 1340 pt states it was red and then it turned brown. Pt states the bleeding is only when she wipes and does not go into her panty liner. Pt reports decrease in appetite today. Pt denies any pain currently.

## 2020-05-13 NOTE — Discharge Instructions (Signed)
Activity Restriction During Pregnancy Your health care provider may recommend specific activity restrictions during pregnancy for a variety of reasons. Activity restriction may require that you limit activities that require great effort, such as exercise, lifting, or sex. The type of activity restriction will vary for each person, depending on your risk or the problems you are having. Activity restriction may be recommended for a period of time until your baby is delivered. Why are activity restrictions recommended? Activity restriction may be recommended if:  Your placenta is partially or completely covering the opening of your cervix (placenta previa).  There is bleeding between the wall of the uterus and the amniotic sac in the first trimester of pregnancy (subchorionic hemorrhage).  You went into labor too early (preterm labor).  You have a history of miscarriage.  You have a condition that causes high blood pressure during pregnancy (preeclampsia or eclampsia).  You are pregnant with more than one baby.  Your baby is not growing well. What are the risks? The risks depend on your specific restriction. Strict bed rest has the most physical and emotional risks and is no longer routinely recommended. Risks of strict bed rest include:  Loss of muscle conditioning from not moving.  Blood clots.  Social isolation.  Depression.  Loss of income. Talk with your health care team about activity restriction to decide if it is best for you and your baby. Even if you are having problems during your pregnancy, you may be able to continue with normal levels of activity with careful monitoring by your health care team. Follow these instructions at home: If needed, based on your overall health and the health of your baby, your health care provider will decide which type of activity restriction is right for you. Activity restrictions may include:  Not lifting anything heavier than 10 pounds (4.5  kg).  Avoiding activities that take a lot of physical effort.  No lifting or straining.  Resting in a sitting position or lying down for periods of time during the day. Pelvic rest may be recommended along with activity restrictions. If pelvic rest is recommended, then:  Do not have sex, an orgasm, or use sexual stimulation.  Do not use tampons. Do not douche. Do not put anything into your vagina.  Do not lift anything that is heavier than 10 lb (4.5 kg).  Avoid activities that require a lot of effort.  Avoid any activity in which your pelvic muscles could become strained, such as squatting. Questions to ask your health care provider  Why is my activity being limited?  How will activity restrictions affect my body?  Why is rest helpful for me and my baby?  What activities can I do?  When can I return to normal activities? When should I seek immediate medical care? Seek immediate medical care if you have:  Vaginal bleeding.  Vaginal discharge.  Cramping pain in your lower abdomen.  Regular contractions.  A low, dull backache. Summary  Your health care provider may recommend specific activity restrictions during pregnancy for a variety of reasons.  Activity restriction may require that you limit activities such as exercise, lifting, sex, or any other activity that requires great effort.  Discuss the risks and benefits of activity restriction with your health care team to decide if it is best for you and your baby.  Contact your health care provider right away if you think you are having contractions, or if you notice vaginal bleeding, discharge, or cramping. This information is not   intended to replace advice given to you by your health care provider. Make sure you discuss any questions you have with your health care provider. Document Revised: 07/10/2019 Document Reviewed: 02/06/2018 Elsevier Patient Education  [place] Hematoma  A  subchorionic hematoma is a gathering of blood between the outer wall of the embryo (chorion) and the inner wall of the womb (uterus). This condition can cause vaginal bleeding. If they cause little or no vaginal bleeding, early small hematomas usually shrink on their own and do not affect your baby or pregnancy. When bleeding starts later in pregnancy, or if the hematoma is larger or occurs in older pregnant women, the condition may be more serious. Larger hematomas may get bigger, which increases the chances of miscarriage. This condition also increases the risk of:  Premature separation of the placenta from the uterus.  Premature (preterm) labor.  Stillbirth. What are the causes? The exact cause of this condition is not known. It occurs when blood is trapped between the placenta and the uterine wall because the placenta has separated from the original site of implantation. What increases the risk? You are more likely to develop this condition if:  You were treated with fertility medicines.  You conceived through in vitro fertilization (IVF). What are the signs or symptoms? Symptoms of this condition include:  Vaginal spotting or bleeding.  Contractions of the uterus. These cause abdominal pain. Sometimes you may have no symptoms and the bleeding may only be seen when ultrasound images are taken (transvaginal ultrasound). How is this diagnosed? This condition is diagnosed based on a physical exam. This includes a pelvic exam. You may also have other tests, including:  Blood tests.  Urine tests.  Ultrasound of the abdomen. How is this treated? Treatment for this condition can vary. Treatment may include:  Watchful waiting. You will be monitored closely for any changes in bleeding. During this stage: ? The hematoma may be reabsorbed by the body. ? The hematoma may separate the fluid-filled space containing the embryo (gestational sac) from the wall of the womb  (endometrium).  Medicines.  Activity restriction. This may be needed until the bleeding stops. Follow these instructions at home:  Stay on bed rest if told to do so by your health care provider.  Do not lift anything that is heavier than 10 lbs. (4.5 kg) or as told by your health care provider.  Do not use any products that contain nicotine or tobacco, such as cigarettes and e-cigarettes. If you need help quitting, ask your health care provider.  Track and write down the number of pads you use each day and how soaked (saturated) they are.  Do not use tampons.  Keep all follow-up visits as told by your health care provider. This is important. Your health care provider may ask you to have follow-up blood tests or ultrasound tests or both. Contact a health care provider if:  You have any vaginal bleeding.  You have a fever. Get help right away if:  You have severe cramps in your stomach, back, abdomen, or pelvis.  You pass large clots or tissue. Save any tissue for your health care provider to look at.  You have more vaginal bleeding, and you faint or become lightheaded or weak. Summary  A subchorionic hematoma is a gathering of blood between the outer wall of the placenta and the uterus.  This condition can cause vaginal bleeding.  Sometimes you may have no symptoms and the bleeding may only be  seen when ultrasound images are taken.  Treatment may include watchful waiting, medicines, or activity restriction. This information is not intended to replace advice given to you by your health care provider. Make sure you discuss any questions you have with your health care provider. Document Revised: 09/29/2017 Document Reviewed: 12/13/2016 Elsevier Patient Education  2020 [employer].

## 2020-05-13 NOTE — MAU Provider Note (Addendum)
Patient Pam Vargas is a 39 y.o. [id number]  at [redacted]w[redacted]d here with complaints of vaginal bleeding. She denies cramping, dysuria, nausea, vomiting. She has a history of miscarriage. She has a history of C/section and VBAC. Youngest child is 11 years old.   She has a pregnancy complicated by AMA and cervical polyp. She denies diabetes, HTN, any other co-morbidities.    History    CSN: [id number]  Arrival date and time: 05/13/20 1904   None   Chief Complaint  Patient presents with  . Vaginal Bleeding   Vaginal Bleeding The patient's primary symptoms include vaginal bleeding. This is a new problem. The current episode started today. The problem occurs intermittently. The problem has been gradually improving. The patient is experiencing no pain. Pertinent negatives include no back pain, chills, constipation or diarrhea. The vaginal bleeding is spotting. She has not been passing clots. She has not been passing tissue.    OB History    Gravida  6   Para  3   Term  1   Preterm  1   AB  2   Living  2     SAB  2   TAB  0   Ectopic  0   Multiple  0   Live Births  2           Past Medical History:  Diagnosis Date  . History of preterm delivery 09/12/2016   25 week VBAC secondary to SOL- [ ] Ask about17-P  . Preterm labor   . Previous cesarean section 09/12/2016   Arrest of descent Desires [ ]  TOLAC vs [ ]  RCS    Past Surgical History:  Procedure Laterality Date  . CESAREAN SECTION    . FOOT SURGERY    . FOOT SURGERY      Family History  Problem Relation Age of Onset  . Diabetes Mother   . Hyperlipidemia Mother   . Hypertension Mother   . Heart disease Father   . Hypertension Father     Social History   Tobacco Use  . Smoking status: Former Smoker    Packs/day: 0.30    Types: Cigarettes    Quit date: 03/13/2018    Years since quitting: 2.1  . Smokeless tobacco: Never Used  Vaping Use  . Vaping Use: Never used  Substance Use Topics  . Alcohol use:  Not Currently    Comment: last drink 04/21/2018  . Drug use: No    Allergies: No Known Allergies  Medications Prior to Admission  Medication Sig Dispense Refill Last Dose  . acetaminophen (TYLENOL) 500 MG tablet Take 500 mg by mouth every 6 (six) hours as needed.   05/13/2020 at Unknown time  . Prenatal Vit-Fe Phos-FA-Omega (VITAFOL GUMMIES) 3.33-0.333-34.8 MG CHEW Chew 3 tablets by mouth daily.   05/13/2020 at Unknown time  . cyclobenzaprine (FLEXERIL) 5 MG tablet Take 1 tablet (5 mg total) by mouth 3 (three) times daily as needed for muscle spasms. 20 tablet 0   . Prenatal MV & Min w/FA-DHA (PRENATAL ADULT GUMMY/DHA/FA) 0.4-25 MG CHEW Chew 1 Dose by mouth daily. (Patient not taking: Reported on 05/05/2020) 90 tablet 2     Review of Systems  Constitutional: Negative for chills.  Respiratory: Negative.   Cardiovascular: Negative.   Gastrointestinal: Negative for constipation and diarrhea.  Genitourinary: Positive for vaginal bleeding.  Musculoskeletal: Negative for back pain.  Neurological: Negative.    Physical Exam   Blood pressure (!) 121/54, temperature 98.7 F (37.1 C),  resp. rate 19, weight 54.2 kg, last menstrual period 03/07/2020.  Physical Exam Constitutional:      Appearance: Normal appearance.  HENT:     Head: Normocephalic.  Cardiovascular:     Rate and Rhythm: Normal rate.     Pulses: Normal pulses.  Abdominal:     General: Abdomen is flat.  Genitourinary:    General: Normal vulva.     Comments: NEFG; dark brown blood noted in the vagina, no clots, no frank bleeding, cercix is FT, soft, anterior with palpable mass. Non-tender, no suprapubic or adnexal tenderness.  Skin:    General: Skin is warm.  Neurological:     Mental Status: She is alert.     MAU Course  Procedures  MDM -Blood type reviewed: O pos -CBC normal -UA positive for blood, no other signs of infection.  Wet prep: negative -US shows viable IUP with cardiac activity. [place] images reviewed by me  independently.  Assessment and Plan   1. Subchorionic hemorrhage of placenta in first trimester, single or unspecified fetus   2. Vaginal bleeding in pregnancy, first trimester    2. Explained cause of bleeding and [facility]; bleeding precautions reviewed. Advised pelvic rest, return if bleeding increases.  -keep NOB intake visit on July 29 at [facility] [facility] pending -All questions answered; patient stable for discharge.     Mervyn Skeeters Laporsha Grealish 05/13/2020, 8:09 PM

## 2020-05-14 LAB — GC/CHLAMYDIA PROBE AMP (~~LOC~~) NOT AT ARMC
Chlamydia: NEGATIVE
Comment: NEGATIVE
Comment: NORMAL
Neisseria Gonorrhea: NEGATIVE

## 2020-05-27 ENCOUNTER — Telehealth (INDEPENDENT_AMBULATORY_CARE_PROVIDER_SITE_OTHER): Payer: Medicaid Other | Admitting: *Deleted

## 2020-05-27 ENCOUNTER — Other Ambulatory Visit: Payer: Self-pay

## 2020-05-27 DIAGNOSIS — O418X90 Other specified disorders of amniotic fluid and membranes, unspecified trimester, not applicable or unspecified: Secondary | ICD-10-CM

## 2020-05-27 DIAGNOSIS — N96 Recurrent pregnancy loss: Secondary | ICD-10-CM

## 2020-05-27 DIAGNOSIS — O418X10 Other specified disorders of amniotic fluid and membranes, first trimester, not applicable or unspecified: Secondary | ICD-10-CM

## 2020-05-27 DIAGNOSIS — Z98891 History of uterine scar from previous surgery: Secondary | ICD-10-CM

## 2020-05-27 DIAGNOSIS — O09899 Supervision of other high risk pregnancies, unspecified trimester: Secondary | ICD-10-CM

## 2020-05-27 DIAGNOSIS — O209 Hemorrhage in early pregnancy, unspecified: Secondary | ICD-10-CM

## 2020-05-27 DIAGNOSIS — O34219 Maternal care for unspecified type scar from previous cesarean delivery: Secondary | ICD-10-CM

## 2020-05-27 DIAGNOSIS — O0990 Supervision of high risk pregnancy, unspecified, unspecified trimester: Secondary | ICD-10-CM

## 2020-05-27 DIAGNOSIS — O09529 Supervision of elderly multigravida, unspecified trimester: Secondary | ICD-10-CM | POA: Insufficient documentation

## 2020-05-27 MED ORDER — BLOOD PRESSURE KIT DEVI: 1.0000 | 0 refills | Status: DC | PRN

## 2020-05-27 NOTE — Progress Notes (Signed)
I connected with  Pam Vargas on 05/27/20 at  1:15 PM EDT by virtually and verified that I am speaking with the correct person using two identifiers.   I discussed the limitations, risks, security and privacy concerns of performing an evaluation and management service by virtually and the availability of in person appointments. I also discussed with the patient that there may be a patient responsible charge related to this service. The patient expressed understanding and agreed to proceed.  I explained I am completing her New OB Intake today. We discussed Her EDD and that it is based on  sure LMP . I reviewed her allergies, meds, OB History, Medical /Surgical history, and appropriate screenings. I informed her of [facility] services. She is [id number] with HX PTD, AMA and has cervical mass found on [place] for this pregnancy.    I explained I will send her the [employer] app and app was sent to her while on phone. She will complete after virtual visit.   I explained we will have her take her blood pressure weekly during her pregnancy. She confirmed she does not have a blood pressure cuff and had medicaid. I informed her I will send a blood pressure cuff to [facility] that will fill that prescription and she will pick up the cuff.  I asked her to bring the blood pressure cuff with her to her first ob appointment so we can show her how to use it. Explained  then we will have her take her blood pressure weekly and enter into the app.  I explained she will have some visits in office and some virtually. She already has Community education officer. I reviewed her new ob  appointment date/ time with her , our location and to wear mask, .  I explained she will have a pelvic exam, ob bloodwork, hemoglobin a1C, cbg ,( no pap needed had recent pap), and  genetic testing if desired,- she does want a panorama.  I informed her I will schedule an [place] at 19 weeks and shje will see the appointment in [place].  She voices understanding.   Marshall Roehrich,RN 05/27/2020  1:18 PM

## 2020-05-27 NOTE — Patient Instructions (Signed)

## 2020-05-28 NOTE — Progress Notes (Signed)
Patient was assessed and managed by nursing staff during this encounter. I have reviewed the chart and agree with the documentation and plan. I have also made any necessary editorial changes.  Aletha Halim, MD 05/28/2020 8:37 AM

## 2020-06-03 ENCOUNTER — Ambulatory Visit (INDEPENDENT_AMBULATORY_CARE_PROVIDER_SITE_OTHER): Payer: Medicaid Other | Admitting: Obstetrics and Gynecology

## 2020-06-03 ENCOUNTER — Encounter: Payer: Self-pay | Admitting: Obstetrics and Gynecology

## 2020-06-03 ENCOUNTER — Other Ambulatory Visit: Payer: Self-pay

## 2020-06-03 VITALS — BP 108/66 | HR 89 | Wt 118.0 lb

## 2020-06-03 DIAGNOSIS — O209 Hemorrhage in early pregnancy, unspecified: Secondary | ICD-10-CM

## 2020-06-03 DIAGNOSIS — O09899 Supervision of other high risk pregnancies, unspecified trimester: Secondary | ICD-10-CM

## 2020-06-03 DIAGNOSIS — O0990 Supervision of high risk pregnancy, unspecified, unspecified trimester: Secondary | ICD-10-CM | POA: Diagnosis not present

## 2020-06-03 DIAGNOSIS — Z8751 Personal history of pre-term labor: Secondary | ICD-10-CM

## 2020-06-03 DIAGNOSIS — O09891 Supervision of other high risk pregnancies, first trimester: Secondary | ICD-10-CM | POA: Diagnosis not present

## 2020-06-03 DIAGNOSIS — O09521 Supervision of elderly multigravida, first trimester: Secondary | ICD-10-CM

## 2020-06-03 DIAGNOSIS — Z315 Encounter for genetic counseling: Secondary | ICD-10-CM | POA: Diagnosis not present

## 2020-06-03 DIAGNOSIS — N888 Other specified noninflammatory disorders of cervix uteri: Secondary | ICD-10-CM

## 2020-06-03 DIAGNOSIS — F17200 Nicotine dependence, unspecified, uncomplicated: Secondary | ICD-10-CM

## 2020-06-03 DIAGNOSIS — Z98891 History of uterine scar from previous surgery: Secondary | ICD-10-CM

## 2020-06-03 LAB — POCT URINALYSIS DIP (DEVICE)
Bilirubin Urine: NEGATIVE
Glucose, UA: NEGATIVE mg/dL
Hgb urine dipstick: NEGATIVE
Ketones, ur: NEGATIVE mg/dL
Leukocytes,Ua: NEGATIVE
Nitrite: NEGATIVE
Protein, ur: NEGATIVE mg/dL
Specific Gravity, Urine: 1.02 (ref 1.005–1.030)
Urobilinogen, UA: 0.2 mg/dL (ref 0.0–1.0)
pH: 7 (ref 5.0–8.0)

## 2020-06-03 NOTE — Progress Notes (Signed)
Transfer of Care OB Note  06/03/2020   Clinic: [facility] for [facility]  Chief Complaint: transfer of care from [facility]  History of Present Illness: [name] is a 39 y.o. [id number] @ 12/4 weeks ([place] 2/12, based on Patient's last menstrual period was 03/07/2020.=10wk u/s.  Preg complicated by has TOBACCO USER; History of preterm delivery; Dry skin; Vaginal lesion; Vaginal bleeding affecting early pregnancy; Subchorionic hemorrhage in first trimester; Supervision of high risk pregnancy, antepartum; AMA (advanced maternal age) multigravida 35+; History of preterm delivery, currently pregnant; History of C-section; History of multiple miscarriages; and Cervical mass on their problem list.   Any events prior to today's visit: multiple MAU visits for VB She was using no method when she conceived.  She has Negative signs or symptoms of nausea/vomiting of pregnancy. She has Positive signs or symptoms of miscarriage or preterm labor: vaginal spotting, brown d/c  ROS: A 12-point review of systems was performed and negative, except as stated in the above HPI.  OBGYN History: As per HPI. OB History  Gravida Para Term Preterm AB Living  6 3 1 1 2 2   SAB TAB Ectopic Multiple Live Births  2 0 0 0 2    # Outcome Date GA Lbr Len/2nd Weight Sex Delivery Anes PTL Lv  6 Current           5 SAB 2019 [redacted]w[redacted]d         4 SAB 05/2017 [redacted]w[redacted]d         3 Para 11/02/16 [redacted]w[redacted]d   M Vag-Spont None  FD     Complications: Premature rupture of membranes in pregnancy  2 Preterm 08/04/04 [redacted]w[redacted]d  1 lb 3.7 oz (0.558 kg) M Vag-Spont EPI  LIV     Birth Comments: had bleeding , went to hospital and had PTD  1 Term 02/19/98 [redacted]w[redacted]d  8 lb (3.629 kg) M CS-LTranv   LIV     Birth Comments: wnl   History of pap smears: Yes. Last pap smear 2020 and results were neg and hpv neg  Past Medical History: Past Medical History:  Diagnosis Date  . Abnormal uterine bleeding (AUB) 03/23/2020  . Cervical mass 04/2020   . History of preterm delivery 09/12/2016   25 week VBAC secondary to SOL- [ ] Ask about17-P  . Preterm labor   . Previous cesarean section 09/12/2016   Arrest of descent Desires [ ]  TOLAC vs [ ]  RCS  . UTI (urinary tract infection)     Past Surgical History: Past Surgical History:  Procedure Laterality Date  . CESAREAN SECTION    . FOOT SURGERY    . FOOT SURGERY      Family History:  Family History  Problem Relation Age of Onset  . Diabetes Mother   . Hyperlipidemia Mother   . Hypertension Mother   . Heart disease Father   . Hypertension Father   . Breast cancer Maternal Aunt    Social History:  Social History   Socioeconomic History  . Marital status: Married    Spouse name: Not on file  . Number of children: Not on file  . Years of education: Not on file  . Highest education level: Not on file  Occupational History  . Not on file  Tobacco Use  . Smoking status: Current Some Day Smoker    Packs/day: 0.30    Types: Cigarettes  . Smokeless tobacco: Never Used  Vaping Use  . Vaping Use: Never used  Substance and Sexual  Activity  . Alcohol use: Not Currently    Comment: last drink 04/21/2018  . Drug use: No  . Sexual activity: Not Currently    Partners: Male    Birth control/protection: None  Other Topics Concern  . Not on file  Social History Narrative   Works at [employer] as a Chartered certified accountant    Social Determinants of Radio broadcast assistant Strain:   . Difficulty of Paying Living Expenses:   Food Insecurity: No Landscape architect  . Worried About Charity fundraiser in the Last Year: Never true  . Ran Out of Food in the Last Year: Never true  Transportation Needs: No Transportation Needs  . Lack of Transportation (Medical): No  . Lack of Transportation (Non-Medical): No  Physical Activity:   . Days of Exercise per Week:   . Minutes of Exercise per Session:   Stress:   . Feeling of Stress :   Social Connections:   . Frequency of Communication  with Friends and Family:   . Frequency of Social Gatherings with Friends and Family:   . Attends Religious Services:   . Active Member of Clubs or Organizations:   . Attends Archivist Meetings:   Marland Kitchen Marital Status:   Intimate Partner Violence:   . Fear of Current or Ex-Partner:   . Emotionally Abused:   Marland Kitchen Physically Abused:   . Sexually Abused:     Allergy: No Known Allergies  Current Outpatient Medications: Prenatal vitamin  Physical Exam:   BP 108/66   Pulse 89   Wt 118 lb (53.5 kg)   LMP 03/07/2020   BMI 20.25 kg/m  Body mass index is 20.25 kg/m. Contractions: Not present Vag. Bleeding: Bloody Show. Fundal height: not applicable FHTs: 409W  General appearance: Well nourished, well developed female in no acute distress.  Respiratory:   Normal respiratory effort Abdomen: no masses, hernias; diffusely non tender to palpation, non distended Neuro/Psych:  Normal mood and affect.  Skin:  Warm and dry.  Lymphatic:  No inguinal lymphadenopathy.   Pelvic exam: is not limited by body habitus EGBUS: within normal limits Vagina: scant amount of old brown/yellow d/c in vault, no active bleeding Cervix: normal appearing with small nabothian cyst at 7 o'clock, visually closed. On bimanual, the cervix at 7 o'clock feels hard and barrel shaped, it is nttp, non friable. Cervix is closed/long/high  Laboratory: Reviewed  Imaging:  Reviewed Narrative & Impression  CLINICAL DATA:  Initial evaluation for vaginal bleeding, early pregnancy.  EXAM: OBSTETRIC <14 WK ULTRASOUND  TECHNIQUE: Transabdominal ultrasound was performed for evaluation of the gestation as well as the maternal uterus and adnexal regions.  COMPARISON:  Prior ultrasound from 04/24/2020.  FINDINGS: Intrauterine gestational sac: Single  Yolk sac:  Not visualized.  Embryo:  Present  Cardiac Activity: Present  Heart Rate: 162 bpm  CRL: 33.1 mm   10 w 1 d                  [place] EDC:  12/08/2020  Subchorionic hemorrhage: Probable tiny/trace subchorionic hemorrhage noted without associated mass effect.  Maternal uterus/adnexae: Ovaries within normal limits bilaterally. Small degenerating corpus luteal cyst noted on the left.  1.3 x 1.1 x 1.6 cm echogenic lesion positioned at the cervix again noted, stable as compared to previous exams.  IMPRESSION: 1. Single viable intrauterine pregnancy as above, estimated gestational age [redacted] weeks and 1 day by crown-rump length, with ultrasound EDC of 12/08/2020. 2. Probable trace subchorionic  hemorrhage without associated mass effect. 3. Stable 1.6 cm echogenic mass at the region of the cervix.   Electronically Signed   By: Jeannine Boga M.D.   On: 05/13/2020 20:23     Assessment: pt stable  Plan: 1. Supervision of high risk pregnancy, antepartum Routine care. For genetics today. Will do some additional NOB labs today. Hold off on low dose ASA for now given bleeding.  - Culture, OB Urine - Genetic Screening - Hemoglobin A1c - Hepatitis C Antibody - Comprehensive metabolic panel - Protein / creatinine ratio, urine - VITAMIN D 25 Hydroxy (Vit-D Deficiency, Fractures) - MR PELVIS WO CONTRAST; Future - AMB referral to maternal fetal medicine  2. Cervical mass I looked at the images and it appears avascular and doesn't look like a nabothian cyst or like a fibroid. I don't see it on any of her u/s until this year. Will get an MRI and refer to MFM (see below) - VITAMIN D 25 Hydroxy (Vit-D Deficiency, Fractures) - MR PELVIS WO CONTRAST; Future  3. History of preterm delivery 2005 25wks preterm labor and birth. 2018 17wk PTL. In 2018, it sounds like she had sudden onsent pain and in triage had PPROM and parts in the vagina; she was due to start 17p during that pregnancy.  D/w her that I recommend an MFM consult after her MRI. D/w her that they will d/w her re: serial cervical lengths, 17p, and/or cerclage.  -  AMB referral to maternal fetal medicine  4. Vaginal bleeding affecting early pregnancy MAU precautions given. Will see weekly as long as she's having bleeding  5. Multigravida of advanced maternal age in first trimester See above  6. History of preterm delivery, currently pregnant  7. History of C-section  8. History of VBAC  9. TOBACCO USER Counseling given  Problem list reviewed and updated.  Follow up in 1 weeks.  >50% of 30 min visit spent on counseling and coordination of care.     Durene Romans MD Attending Center for [facility])

## 2020-06-04 ENCOUNTER — Encounter: Payer: Self-pay | Admitting: *Deleted

## 2020-06-04 LAB — COMPREHENSIVE METABOLIC PANEL
ALT: 8 IU/L (ref 0–32)
AST: 14 IU/L (ref 0–40)
Albumin/Globulin Ratio: 1.4 (ref 1.2–2.2)
Albumin: 4 g/dL (ref 3.8–4.8)
Alkaline Phosphatase: 61 IU/L (ref 48–121)
BUN/Creatinine Ratio: 11 (ref 9–23)
BUN: 7 mg/dL (ref 6–24)
Bilirubin Total: 0.2 mg/dL (ref 0.0–1.2)
CO2: 25 mmol/L (ref 20–29)
Calcium: 10.3 mg/dL — ABNORMAL HIGH (ref 8.7–10.2)
Chloride: 101 mmol/L (ref 96–106)
Creatinine, Ser: 0.61 mg/dL (ref 0.57–1.00)
GFR calc Af Amer: 130 mL/min/{1.73_m2} (ref >59)
GFR calc non Af Amer: 113 mL/min/{1.73_m2} (ref >59)
Globulin, Total: 2.9 g/dL (ref 1.5–4.5)
Glucose: 68 mg/dL (ref 65–99)
Potassium: 4.1 mmol/L (ref 3.5–5.2)
Sodium: 137 mmol/L (ref 134–144)
Total Protein: 6.9 g/dL (ref 6.0–8.5)

## 2020-06-04 LAB — PROTEIN / CREATININE RATIO, URINE
Creatinine, Urine: 68.2 mg/dL
Protein, Ur: 6.4 mg/dL
Protein/Creat Ratio: 94 mg/g creat (ref 0–200)

## 2020-06-04 LAB — VITAMIN D 25 HYDROXY (VIT D DEFICIENCY, FRACTURES): Vit D, 25-Hydroxy: 43.4 ng/mL (ref 30.0–100.0)

## 2020-06-04 LAB — HEMOGLOBIN A1C
Est. average glucose Bld gHb Est-mCnc: 103 mg/dL
Hgb A1c MFr Bld: 5.2 % (ref 4.8–5.6)

## 2020-06-04 LAB — HEPATITIS C ANTIBODY: Hep C Virus Ab: 0.1 s/co ratio (ref 0.0–0.9)

## 2020-06-06 LAB — URINE CULTURE, OB REFLEX

## 2020-06-06 LAB — CULTURE, OB URINE

## 2020-06-10 ENCOUNTER — Other Ambulatory Visit: Payer: Self-pay

## 2020-06-10 ENCOUNTER — Ambulatory Visit (INDEPENDENT_AMBULATORY_CARE_PROVIDER_SITE_OTHER): Payer: Medicaid Other | Admitting: Obstetrics and Gynecology

## 2020-06-10 VITALS — BP 105/53 | HR 81 | Wt 122.7 lb

## 2020-06-10 DIAGNOSIS — O0990 Supervision of high risk pregnancy, unspecified, unspecified trimester: Secondary | ICD-10-CM

## 2020-06-10 DIAGNOSIS — Z3A13 13 weeks gestation of pregnancy: Secondary | ICD-10-CM

## 2020-06-10 DIAGNOSIS — N888 Other specified noninflammatory disorders of cervix uteri: Secondary | ICD-10-CM

## 2020-06-10 DIAGNOSIS — O09899 Supervision of other high risk pregnancies, unspecified trimester: Secondary | ICD-10-CM

## 2020-06-10 DIAGNOSIS — N96 Recurrent pregnancy loss: Secondary | ICD-10-CM

## 2020-06-10 DIAGNOSIS — O418X10 Other specified disorders of amniotic fluid and membranes, first trimester, not applicable or unspecified: Secondary | ICD-10-CM

## 2020-06-10 DIAGNOSIS — O468X1 Other antepartum hemorrhage, first trimester: Secondary | ICD-10-CM

## 2020-06-10 DIAGNOSIS — O209 Hemorrhage in early pregnancy, unspecified: Secondary | ICD-10-CM

## 2020-06-10 DIAGNOSIS — O09521 Supervision of elderly multigravida, first trimester: Secondary | ICD-10-CM

## 2020-06-10 NOTE — Progress Notes (Signed)
Prenatal Visit Note Date: 06/10/2020 [facility]  Subjective:  Pam Vargas is a 39 y.o. [id number] at [redacted]w[redacted]d being seen today for ongoing prenatal care.  She is currently monitored for the following issues for this high-risk pregnancy and has TOBACCO USER; History of preterm delivery; Dry skin; Vaginal lesion; Vaginal bleeding affecting early pregnancy; Subchorionic hemorrhage in first trimester; Supervision of high risk pregnancy, antepartum; AMA (advanced maternal age) multigravida 35+; History of preterm delivery, currently pregnant; History of C-section; History of multiple miscarriages; and Cervical mass on their problem list.  Patient reports no complaints.   Contractions: Not present. Vag. Bleeding: None.  Movement: Absent. Denies leaking of fluid.   The following portions of the patient's history were reviewed and updated as appropriate: allergies, current medications, past family history, past medical history, past social history, past surgical history and problem list. Problem list updated.  Objective:   Vitals:   06/10/20 0946  BP: (!) 105/53  Pulse: 81  Weight: 122 lb 11.2 oz (55.7 kg)    Fetal Status: Fetal Heart Rate (bpm): 161   Movement: Absent     General:  Alert, oriented and cooperative. Patient is in no acute distress.  Skin: Skin is warm and dry. No rash noted.   Cardiovascular: Normal heart rate noted  Respiratory: Normal respiratory effort, no problems with respiration noted  Abdomen: Soft, gravid, appropriate for gestational age. Pain/Pressure: Absent     Pelvic:  Cervical exam deferred        Extremities: Normal range of motion.  Edema: None  Mental Status: Normal mood and affect. Normal behavior. Normal judgment and thought content.   Urinalysis:      Assessment and Plan:  Pregnancy: [id number] at [redacted]w[redacted]d  1. Supervision of high risk pregnancy, antepartum Routine care. Offer afp at 15wks  2. Multigravida of advanced maternal  age in first trimester cffdna results in process  3. Cervical mass For mri on 8/13. See below  4. History of preterm delivery, currently pregnant Pt to see mfm for consult early next week   5. History of multiple miscarriages See above  6. Vaginal bleeding affecting early pregnancy Resolved three days ago, no bleeding even with wiping  7. Subchorionic hemorrhage of placenta in first trimester, single or unspecified fetus  Preterm labor symptoms and general obstetric precautions including but not limited to vaginal bleeding, contractions, leaking of fluid and fetal movement were reviewed in detail with the patient. Please refer to After Visit Summary for other counseling recommendations.  RTC: De Hollingshead, MD

## 2020-06-12 ENCOUNTER — Ambulatory Visit (HOSPITAL_COMMUNITY)
Admission: RE | Admit: 2020-06-12 | Discharge: 2020-06-12 | Disposition: A | Discharge status: Home / Self Care | Payer: Medicaid Other | Source: Ambulatory Visit | Attending: Obstetrics and Gynecology | Admitting: Obstetrics and Gynecology

## 2020-06-12 ENCOUNTER — Telehealth: Payer: Self-pay | Admitting: Obstetrics and Gynecology

## 2020-06-12 ENCOUNTER — Other Ambulatory Visit: Payer: Self-pay

## 2020-06-12 ENCOUNTER — Encounter: Payer: Medicaid Other | Admitting: Obstetrics and Gynecology

## 2020-06-12 ENCOUNTER — Other Ambulatory Visit: Payer: Self-pay | Admitting: Obstetrics and Gynecology

## 2020-06-12 DIAGNOSIS — N888 Other specified noninflammatory disorders of cervix uteri: Secondary | ICD-10-CM | POA: Diagnosis not present

## 2020-06-12 DIAGNOSIS — O0990 Supervision of high risk pregnancy, unspecified, unspecified trimester: Secondary | ICD-10-CM | POA: Insufficient documentation

## 2020-06-12 DIAGNOSIS — Z3A13 13 weeks gestation of pregnancy: Secondary | ICD-10-CM | POA: Diagnosis not present

## 2020-06-12 DIAGNOSIS — Z8751 Personal history of pre-term labor: Secondary | ICD-10-CM

## 2020-06-12 NOTE — Telephone Encounter (Signed)
Called and left patient a detailed message about [facility] appointment on Monday 06-15-2020 @10 :45am.

## 2020-06-15 ENCOUNTER — Other Ambulatory Visit: Payer: Self-pay | Admitting: Obstetrics and Gynecology

## 2020-06-15 ENCOUNTER — Ambulatory Visit: Payer: Medicaid Other | Attending: Obstetrics and Gynecology | Admitting: Obstetrics and Gynecology

## 2020-06-15 ENCOUNTER — Other Ambulatory Visit: Payer: Self-pay

## 2020-06-15 ENCOUNTER — Other Ambulatory Visit: Payer: Self-pay | Admitting: *Deleted

## 2020-06-15 ENCOUNTER — Ambulatory Visit: Payer: Medicaid Other | Admitting: *Deleted

## 2020-06-15 ENCOUNTER — Encounter: Payer: Self-pay | Admitting: *Deleted

## 2020-06-15 ENCOUNTER — Ambulatory Visit (HOSPITAL_BASED_OUTPATIENT_CLINIC_OR_DEPARTMENT_OTHER): Payer: Medicaid Other

## 2020-06-15 DIAGNOSIS — Z3A14 14 weeks gestation of pregnancy: Secondary | ICD-10-CM | POA: Insufficient documentation

## 2020-06-15 DIAGNOSIS — Z87891 Personal history of nicotine dependence: Secondary | ICD-10-CM | POA: Diagnosis not present

## 2020-06-15 DIAGNOSIS — O09212 Supervision of pregnancy with history of pre-term labor, second trimester: Secondary | ICD-10-CM | POA: Diagnosis not present

## 2020-06-15 DIAGNOSIS — O42912 Preterm premature rupture of membranes, unspecified as to length of time between rupture and onset of labor, second trimester: Secondary | ICD-10-CM | POA: Diagnosis not present

## 2020-06-15 DIAGNOSIS — O26892 Other specified pregnancy related conditions, second trimester: Secondary | ICD-10-CM | POA: Diagnosis not present

## 2020-06-15 DIAGNOSIS — O09522 Supervision of elderly multigravida, second trimester: Secondary | ICD-10-CM | POA: Insufficient documentation

## 2020-06-15 DIAGNOSIS — O99891 Other specified diseases and conditions complicating pregnancy: Secondary | ICD-10-CM | POA: Diagnosis not present

## 2020-06-15 DIAGNOSIS — N889 Noninflammatory disorder of cervix uteri, unspecified: Secondary | ICD-10-CM

## 2020-06-15 DIAGNOSIS — O34219 Maternal care for unspecified type scar from previous cesarean delivery: Secondary | ICD-10-CM | POA: Diagnosis not present

## 2020-06-15 DIAGNOSIS — Z8759 Personal history of other complications of pregnancy, childbirth and the puerperium: Secondary | ICD-10-CM | POA: Diagnosis not present

## 2020-06-15 DIAGNOSIS — O09899 Supervision of other high risk pregnancies, unspecified trimester: Secondary | ICD-10-CM

## 2020-06-15 DIAGNOSIS — N96 Recurrent pregnancy loss: Secondary | ICD-10-CM

## 2020-06-15 DIAGNOSIS — O09892 Supervision of other high risk pregnancies, second trimester: Secondary | ICD-10-CM

## 2020-06-15 DIAGNOSIS — O0990 Supervision of high risk pregnancy, unspecified, unspecified trimester: Secondary | ICD-10-CM

## 2020-06-15 DIAGNOSIS — O321XX0 Maternal care for breech presentation, not applicable or unspecified: Secondary | ICD-10-CM

## 2020-06-15 DIAGNOSIS — Z98891 History of uterine scar from previous surgery: Secondary | ICD-10-CM

## 2020-06-15 DIAGNOSIS — N888 Other specified noninflammatory disorders of cervix uteri: Secondary | ICD-10-CM

## 2020-06-15 DIAGNOSIS — N883 Incompetence of cervix uteri: Secondary | ICD-10-CM

## 2020-06-15 DIAGNOSIS — O209 Hemorrhage in early pregnancy, unspecified: Secondary | ICD-10-CM | POA: Insufficient documentation

## 2020-06-15 NOTE — Progress Notes (Signed)
Maternal-Fetal Medicine   Name: Pam Vargas DOB: 02/19/1981 MRN: [id number] Referring Provider: Aletha Halim, MD  I had the pleasure of seeing [name] today at the [place] for maternal fetal care.  She is G6 P1132 at 14w 2d gestation and he is here for ultrasound and consultation. Obstetric history is significant for a term cesarean delivery in 1999 of a female infant weighing 8 pounds at birth.  He is in good health.  In October 2005, she had a spontaneous preterm birth at [redacted] weeks gestation of a female infant weighing 558 g at birth.  He is in good health.  Patient did not have severe vaginal bleeding or fever before delivery. In January 2018, patient had a pregnancy loss at [redacted] weeks gestation.  Patient was evaluated in the MAU for complaints of abdominal pain.  On examination, small amount of blood was seen in the cervix.  The cervix was reported as closed and long.  Patient then had spontaneous vaginal delivery.  A diagnosis of PPROM was made.  No ultrasound was performed.  Patient reports she took 2 weekly injections of progesterone in that pregnancy. She also had 2 early spontaneous miscarriages in 2018 and 2019.  In this pregnancy, on ultrasound performed at [redacted] weeks gestation, hyperechoic mass on the cervix measuring 1.8 x 1.2 x 1.5 cm, was seen.  Subsequent ultrasound performed 4 days and 3 weeks later showed the findings were unchanged.  Patient had MRI pelvis on 06/12/2020 that showed a well-defined homogenous lesion on the right cervical stroma and it measured 1.7 x 1.1 x 1 cm. On cell free fetal DNA screening, the risks of fetal aneuploidies are not increased.  Patient had vaginal bleeding early in pregnancy (6 weeks).  Since then she has not had vaginal bleeding or abdominal pain or vaginal discharge. GYN history: No history of abnormal Pap smears or cervical surgeries.  No history of breast disease.  Her previous menstrual cycles were reportedly regular.  Past medical history: No  history of hypertension or diabetes or any other chronic medical conditions. Past surgical history: Cesarean section, foot surgery. Medications: Prenatal vitamins.  Allergies: No known drug allergies. Social history: Ex-smoker.  Patient reports she does not smoke cigarettes now.  No history of alcohol or drug use.  Her partner has physical disabilities because of past injury.  But otherwise in good health. Family history: No history of venous thromboembolism in the family.  We performed a limited ultrasound study today.  Amniotic fluid is normal and good fetal activity seen.  No detailed anatomical survey was performed.  We performed transvaginal ultrasound to evaluate the cervix and the mass.  The cervix measures 3.8 cm, which is within normal limits.  A bright, whitish granular structure was seen in the stroma of the cervix on the right side and close to the external os.  No mass was seen in the cervical canal. After explaining, I performed sterile speculum examination.  Vagina and cervix appeared normal and the external os was closed.  A small irregular whitish protuberance on the cervix on the right of external os.  On digital examination the cervix appeared 2 cm long with closed external os.  No clear mass was felt on digital examination.  I counseled the couple on the following History of preterm delivery and midtrimester pregnancy loss History of previous preterm deliveries a single most important cause for recurrent preterm birth.  I could not obtain previous records (2005) but based on her history it is more likely  to be spontaneous preterm birth.  From her chart (2018), I note that she had PPROM in her pregnancy associated with 17-week loss.  Both her previous history of preterm birth, and a mid-trimester pregnancy loss, suggest the possibility of cervical incompetence.  However, cervical incompetence cannot be conclusively proven. I discussed the importance of 17 alpha hydroxyprogesterone  prophylaxis in this pregnancy.   I also discussed prophylactic cerclage and explained the procedure. Alternatively, patient can have weekly cervical length measurements from [redacted] weeks gestation till [redacted] weeks gestation. Patient strongly desires some prophylactic cerclage.  She seemed a little reluctant to take progesterone injections because of her history of 17-week pregnancy loss that followed progesterone injections.  I have reassured her that the cause of her miscarriage is not from progesterone.  Intracervical mass From ultrasound and MRI findings, it could represent a calcified or benign mass.  Is reassuring to note that the mass has not increased in size and I recommend frequent follow-up in this pregnancy by ultrasound.  Cerclage can be placed at a higher level and this mass should not interfere with the procedure.  Advanced maternal age I counseled the patient that advanced maternal age is associated with an increase in fetal aneuploidies.  I discussed the significance and limitations of cell free fetal DNA screening, which has a greater detection for Down syndrome.  I also informed her that only amniocentesis will give a definitive result on the fetal karyotype. In women over 58 years of age, there is a small increase in stillbirth rates (absolute number is very small) late in pregnancy.  We recommend weekly antenatal testing from [redacted] weeks gestation till delivery.  Recommendations -Prophylactic cerclage now. -Fetal anatomy scan and transvaginal assessment to evaluate the cervical mass at 18 weeks' gestation. -Fetal growth assessment at [redacted] weeks gestation. -Weekly BPP from [redacted] weeks gestation till delivery.  Thank you for consultation.  If you have any questions, please do not hesitate to contact me at the Center for maternal fetal care. Consultation including face-to-face counseling 45 minutes.

## 2020-06-15 NOTE — Progress Notes (Unsigned)
Maternal-Fetal Medicine   Name: Pam Vargas DOB: 10-19-1981 MRN: [id number] Referring Provider: Aletha Halim, MD  I had the pleasure of seeing Pam Vargas today at the [place] for maternal fetal care.  She is G6 P1132 at 14w 2d gestation and he is here for ultrasound and consultation. Obstetric history is significant for a term cesarean delivery in 1999 of a female infant weighing 8 pounds at birth.  He is in good health.  In October 2005, she had a spontaneous preterm birth at [redacted] weeks gestation of a female infant weighing 558 g at birth.  He is in good health.  Patient did not have severe vaginal bleeding or fever before delivery. In January 2018, patient had a pregnancy loss at [redacted] weeks gestation.  Patient was evaluated in the MAU for complaints of abdominal pain.  On examination, small amount of blood was seen in the cervix.  The cervix was reported as closed and long.  Patient then had spontaneous vaginal delivery.  A diagnosis of PPROM was made.  No ultrasound was performed.  Patient reports she took 2 weekly injections of progesterone in that pregnancy. She also had 2 early spontaneous miscarriages in 2018 and 2019.  In this pregnancy, on ultrasound performed at [redacted] weeks gestation, hyperechoic mass on the cervix measuring 1.8 x 1.2 x 1.5 cm, was seen.  Subsequent ultrasound performed 4 days and 3 weeks later showed the findings were unchanged.  Patient had MRI pelvis on 06/12/2020 that showed a well-defined homogenous lesion on the right cervical stroma and it measured 1.7 x 1.1 x 1 cm. On cell free fetal DNA screening, the risks of fetal aneuploidies are not increased.  Patient had vaginal bleeding early in pregnancy (6 weeks).  Since then she has not had vaginal bleeding or abdominal pain or vaginal discharge. GYN history: No history of abnormal Pap smears or cervical surgeries.  No history of breast disease.  Her previous menstrual cycles were reportedly regular.  Past medical history: No  history of hypertension or diabetes or any other chronic medical conditions. Past surgical history: Cesarean section, foot surgery. Medications: Prenatal vitamins.  Allergies: No known drug allergies. Social history: Ex-smoker.  Patient reports she does not smoke cigarettes now.  No history of alcohol or drug use.  Her partner has physical disabilities because of past injury.  But otherwise in good health. Family history: No history of venous thromboembolism in the family.  We performed a limited ultrasound study today.  Amniotic fluid is normal and good fetal activity seen.  No detailed anatomical survey was performed.  We performed transvaginal ultrasound to evaluate the cervix and the mass.  The cervix measures 3.8 cm, which is within normal limits.  A bright, whitish granular structure was seen in the stroma of the cervix on the right side and close to the external os.  No mass was seen in the cervical canal. After explaining, I performed sterile speculum examination.  Vagina and cervix appeared normal and the external os was closed.  A small irregular whitish protuberance on the cervix on the right of external os.  On digital examination the cervix appeared 2 cm long with closed external os.  No clear mass was felt on digital examination.  I counseled the couple on the following History of preterm delivery and midtrimester pregnancy loss History of previous preterm deliveries a single most important cause for recurrent preterm birth.  I could not obtain previous records (2005) but based on her history it is more likely  to be spontaneous preterm birth.  From her chart (2018), I note that she had PPROM in her pregnancy associated with 17-week loss.  Both her previous history of preterm birth, and a mid-trimester pregnancy loss, suggest the possibility of cervical incompetence.  However, cervical incompetence cannot be conclusively proven. I discussed the importance of 17 alpha hydroxyprogesterone  prophylaxis in this pregnancy.   I also discussed prophylactic cerclage and explained the procedure. Alternatively, patient can have weekly cervical length measurements from [redacted] weeks gestation till [redacted] weeks gestation. Patient strongly desires some prophylactic cerclage.  She seemed a little reluctant to take progesterone injections because of her history of 17-week pregnancy loss that followed progesterone injections.  I have reassured her that the cause of her miscarriage is not from progesterone.  Intracervical mass From ultrasound and MRI findings, it could represent a calcified or benign mass.  Is reassuring to note that the mass has not increased in size and I recommend frequent follow-up in this pregnancy by ultrasound.  Cerclage can be placed at a higher level and this mass should not interfere with the procedure.  Advanced maternal age I counseled the patient that advanced maternal age is associated with an increase in fetal aneuploidies.  I discussed the significance and limitations of cell free fetal DNA screening, which has a greater detection for Down syndrome.  I also informed her that only amniocentesis will give a definitive result on the fetal karyotype. In women over 19 years of age, there is a small increase in stillbirth rates (absolute number is very small) late in pregnancy.  We recommend weekly antenatal testing from [redacted] weeks gestation till delivery.  Recommendations -Prophylactic cerclage now. -Fetal anatomy scan and transvaginal assessment to evaluate the cervical mass at 18 weeks' gestation. -Fetal growth assessment at [redacted] weeks gestation. -Weekly BPP from [redacted] weeks gestation till delivery.  Thank you for consultation.  If you have any questions, please do not hesitate to contact me at the Center for maternal fetal care. Consultation including face-to-face counseling 45 minutes.

## 2020-06-17 ENCOUNTER — Encounter: Payer: Self-pay | Admitting: Obstetrics and Gynecology

## 2020-06-17 ENCOUNTER — Other Ambulatory Visit: Payer: Self-pay | Admitting: Obstetrics and Gynecology

## 2020-06-17 ENCOUNTER — Other Ambulatory Visit: Payer: Self-pay

## 2020-06-17 ENCOUNTER — Ambulatory Visit (INDEPENDENT_AMBULATORY_CARE_PROVIDER_SITE_OTHER): Payer: Medicaid Other | Admitting: Obstetrics and Gynecology

## 2020-06-17 ENCOUNTER — Inpatient Hospital Stay (HOSPITAL_COMMUNITY)
Admission: AD | Admit: 2020-06-17 | Discharge: 2020-06-17 | Disposition: A | Discharge status: Home / Self Care | Payer: Medicaid Other | Attending: Obstetrics and Gynecology | Admitting: Obstetrics and Gynecology

## 2020-06-17 VITALS — BP 100/68 | HR 103 | Wt 122.9 lb

## 2020-06-17 DIAGNOSIS — Z98891 History of uterine scar from previous surgery: Secondary | ICD-10-CM

## 2020-06-17 DIAGNOSIS — O09899 Supervision of other high risk pregnancies, unspecified trimester: Secondary | ICD-10-CM

## 2020-06-17 DIAGNOSIS — Z20822 Contact with and (suspected) exposure to covid-19: Secondary | ICD-10-CM | POA: Insufficient documentation

## 2020-06-17 DIAGNOSIS — Z01812 Encounter for preprocedural laboratory examination: Secondary | ICD-10-CM | POA: Insufficient documentation

## 2020-06-17 DIAGNOSIS — O209 Hemorrhage in early pregnancy, unspecified: Secondary | ICD-10-CM

## 2020-06-17 DIAGNOSIS — O0990 Supervision of high risk pregnancy, unspecified, unspecified trimester: Secondary | ICD-10-CM

## 2020-06-17 DIAGNOSIS — N898 Other specified noninflammatory disorders of vagina: Secondary | ICD-10-CM

## 2020-06-17 DIAGNOSIS — Z3A14 14 weeks gestation of pregnancy: Secondary | ICD-10-CM

## 2020-06-17 DIAGNOSIS — O09522 Supervision of elderly multigravida, second trimester: Secondary | ICD-10-CM

## 2020-06-17 LAB — CBC
HCT: 37.1 % (ref 36.0–46.0)
Hemoglobin: 12 g/dL (ref 12.0–15.0)
MCH: 27.8 pg (ref 26.0–34.0)
MCHC: 32.3 g/dL (ref 30.0–36.0)
MCV: 86.1 fL (ref 80.0–100.0)
Platelets: 207 10*3/uL (ref 150–400)
RBC: 4.31 MIL/uL (ref 3.87–5.11)
RDW: 13 % (ref 11.5–15.5)
WBC: 8 10*3/uL (ref 4.0–10.5)
nRBC: 0 % (ref 0.0–0.2)

## 2020-06-17 LAB — TYPE AND SCREEN
ABO/RH(D): O POS
Antibody Screen: NEGATIVE

## 2020-06-17 LAB — SARS CORONAVIRUS 2 (TAT 6-24 HRS): SARS Coronavirus 2: NEGATIVE

## 2020-06-17 NOTE — Progress Notes (Signed)
Prenatal Visit Note Date: 06/17/2020 Clinic: [facility]  Subjective:  Pam Vargas is a 39 y.o. [id number] at [redacted]w[redacted]d being seen today for ongoing prenatal care.  She is currently monitored for the following issues for this high-risk pregnancy and has TOBACCO USER; History of preterm delivery; Dry skin; Vaginal lesion; Vaginal bleeding affecting early pregnancy; Subchorionic hemorrhage in first trimester; Supervision of high risk pregnancy, antepartum; AMA (advanced maternal age) multigravida 35+; History of preterm delivery, currently pregnant; History of C-section; History of multiple miscarriages; Cervical mass; and Previous cesarean section on their problem list.  Patient reports no complaints.   Contractions: Not present. Vag. Bleeding: None.  Movement: Absent. Denies leaking of fluid.   The following portions of the patient's history were reviewed and updated as appropriate: allergies, current medications, past family history, past medical history, past social history, past surgical history and problem list. Problem list updated.  Objective:   Vitals:   06/17/20 0822  BP: 100/68  Pulse: (!) 103  Weight: 122 lb 14.4 oz (55.7 kg)    Fetal Status: Fetal Heart Rate (bpm): 156   Movement: Absent     General:  Alert, oriented and cooperative. Patient is in no acute distress.  Skin: Skin is warm and dry. No rash noted.   Cardiovascular: Normal heart rate noted  Respiratory: Normal respiratory effort, no problems with respiration noted  Abdomen: Soft, gravid, appropriate for gestational age. Pain/Pressure: Present     Pelvic:  Cervical exam deferred        Extremities: Normal range of motion.  Edema: None  Mental Status: Normal mood and affect. Normal behavior. Normal judgment and thought content.   Urinalysis:      Assessment and Plan:  Pregnancy: [id number] at [redacted]w[redacted]d  1. Vaginal bleeding affecting early pregnancy None for past few weeks  2. Vaginal lesion See  mfm note and mri. Shouldn't be an issue for ppx cerclage placement this friday  3. Supervision of high risk pregnancy, antepartum Routine care. Offer afp and low dose asa nv. Already set up for anatomy u/s  4. History of preterm delivery, currently pregnant See above. D/w pt and she is amenable to 17p which we can sign her up for and start in 2 weeks  5. History of C-section  6. Multigravida of advanced maternal age in second trimester cffdna pending. See above  Preterm labor symptoms and general obstetric precautions including but not limited to vaginal bleeding, contractions, leaking of fluid and fetal movement were reviewed in detail with the patient. Please refer to After Visit Summary for other counseling recommendations.  Return in about 2 weeks (around 07/01/2020) for high risk, in person.   Aletha Halim, MD

## 2020-06-17 NOTE — MAU Note (Signed)
Pt here for pre-op labs only.  Cerclage on Fri.  No complaints.  Asymptomatic. Swab collected.

## 2020-06-18 ENCOUNTER — Encounter: Payer: Self-pay | Admitting: *Deleted

## 2020-06-18 ENCOUNTER — Telehealth: Payer: Self-pay | Admitting: *Deleted

## 2020-06-18 NOTE — Telephone Encounter (Signed)
Called pt and informed her of abnormal Horizon screen test. She stated that a [place] representative has contacted her and she has scheduled appointment with Dietitian tomorrow @ 9:30 am.

## 2020-06-19 ENCOUNTER — Encounter: Payer: Self-pay | Admitting: General Practice

## 2020-06-19 ENCOUNTER — Encounter (HOSPITAL_COMMUNITY): Payer: Self-pay | Admitting: Obstetrics and Gynecology

## 2020-06-19 ENCOUNTER — Other Ambulatory Visit: Payer: Self-pay | Admitting: Obstetrics and Gynecology

## 2020-06-19 ENCOUNTER — Observation Stay (HOSPITAL_COMMUNITY): Payer: Medicaid Other | Admitting: Anesthesiology

## 2020-06-19 ENCOUNTER — Observation Stay (HOSPITAL_COMMUNITY)
Admission: RE | Admit: 2020-06-19 | Discharge: 2020-06-19 | Disposition: A | Discharge status: Home / Self Care | Payer: Medicaid Other | Attending: Obstetrics and Gynecology | Admitting: Obstetrics and Gynecology

## 2020-06-19 ENCOUNTER — Encounter: Payer: Self-pay | Admitting: Obstetrics and Gynecology

## 2020-06-19 ENCOUNTER — Other Ambulatory Visit: Payer: Self-pay

## 2020-06-19 ENCOUNTER — Encounter (HOSPITAL_COMMUNITY)
Admission: RE | Disposition: A | Discharge status: Home / Self Care | Payer: Self-pay | Source: Home / Self Care | Attending: Obstetrics and Gynecology

## 2020-06-19 DIAGNOSIS — O3430 Maternal care for cervical incompetence, unspecified trimester: Secondary | ICD-10-CM | POA: Diagnosis present

## 2020-06-19 DIAGNOSIS — Z3A14 14 weeks gestation of pregnancy: Secondary | ICD-10-CM | POA: Diagnosis not present

## 2020-06-19 DIAGNOSIS — N888 Other specified noninflammatory disorders of cervix uteri: Secondary | ICD-10-CM | POA: Diagnosis not present

## 2020-06-19 DIAGNOSIS — O09292 Supervision of pregnancy with other poor reproductive or obstetric history, second trimester: Secondary | ICD-10-CM | POA: Diagnosis not present

## 2020-06-19 DIAGNOSIS — O0990 Supervision of high risk pregnancy, unspecified, unspecified trimester: Secondary | ICD-10-CM

## 2020-06-19 DIAGNOSIS — Z3A00 Weeks of gestation of pregnancy not specified: Secondary | ICD-10-CM | POA: Diagnosis not present

## 2020-06-19 DIAGNOSIS — O3433 Maternal care for cervical incompetence, third trimester: Principal | ICD-10-CM | POA: Insufficient documentation

## 2020-06-19 DIAGNOSIS — F17210 Nicotine dependence, cigarettes, uncomplicated: Secondary | ICD-10-CM | POA: Diagnosis not present

## 2020-06-19 DIAGNOSIS — D563 Thalassemia minor: Secondary | ICD-10-CM

## 2020-06-19 DIAGNOSIS — O99891 Other specified diseases and conditions complicating pregnancy: Secondary | ICD-10-CM | POA: Diagnosis not present

## 2020-06-19 DIAGNOSIS — Z9889 Other specified postprocedural states: Secondary | ICD-10-CM

## 2020-06-19 DIAGNOSIS — O2340 Unspecified infection of urinary tract in pregnancy, unspecified trimester: Secondary | ICD-10-CM | POA: Diagnosis not present

## 2020-06-19 HISTORY — PX: CERVICAL CERCLAGE: SHX1329

## 2020-06-19 HISTORY — DX: Maternal care for cervical incompetence, unspecified trimester: O34.30

## 2020-06-19 SURGERY — CERCLAGE, CERVIX, VAGINAL APPROACH
Anesthesia: Spinal | Site: Vagina | Wound class: Clean Contaminated

## 2020-06-19 MED ORDER — IBUPROFEN 600 MG PO TABS: 600.0000 mg | ORAL_TABLET | Freq: Once | ORAL | Status: DC

## 2020-06-19 MED ORDER — CHLOROPROCAINE HCL 50 MG/5ML IT SOLN
INTRATHECAL | Status: AC
  Filled 2020-06-19: qty 5

## 2020-06-19 MED ORDER — MEPERIDINE HCL 25 MG/ML IJ SOLN: 6.2500 mg | INTRAMUSCULAR | Status: DC | PRN

## 2020-06-19 MED ORDER — CEFAZOLIN SODIUM-DEXTROSE 2-4 GM/100ML-% IV SOLN
2.0000 g | INTRAVENOUS | Status: AC
  Administered 2020-06-19: 2 g via INTRAVENOUS

## 2020-06-19 MED ORDER — PHENYLEPHRINE 40 MCG/ML (10ML) SYRINGE FOR IV PUSH (FOR BLOOD PRESSURE SUPPORT)
PREFILLED_SYRINGE | INTRAVENOUS | Status: AC
  Filled 2020-06-19: qty 10

## 2020-06-19 MED ORDER — IBUPROFEN 600 MG PO TABS
600.0000 mg | ORAL_TABLET | Freq: Four times a day (QID) | ORAL | 0 refills | Status: AC
Start: 2020-06-19 — End: 2020-06-21

## 2020-06-19 MED ORDER — IBUPROFEN 600 MG PO TABS
ORAL_TABLET | ORAL | Status: AC
  Filled 2020-06-19: qty 1

## 2020-06-19 MED ORDER — CEFAZOLIN SODIUM-DEXTROSE 2-4 GM/100ML-% IV SOLN
INTRAVENOUS | Status: AC
  Filled 2020-06-19: qty 100

## 2020-06-19 MED ORDER — POVIDONE-IODINE 10 % EX SWAB: 2.0000 "application " | Freq: Once | CUTANEOUS | Status: DC

## 2020-06-19 MED ORDER — LACTATED RINGERS IV SOLN
INTRAVENOUS | Status: DC | PRN
Start: 2020-06-19 — End: 2020-06-19

## 2020-06-19 MED ORDER — ACETAMINOPHEN 10 MG/ML IV SOLN: INTRAVENOUS | Status: DC | PRN

## 2020-06-19 MED ORDER — PHENYLEPHRINE HCL (PRESSORS) 10 MG/ML IV SOLN
INTRAVENOUS | Status: DC | PRN
  Administered 2020-06-19 (×4): 40 ug via INTRAVENOUS

## 2020-06-19 MED ORDER — IBUPROFEN 600 MG PO TABS
600.0000 mg | ORAL_TABLET | Freq: Four times a day (QID) | ORAL | Status: DC | PRN
  Administered 2020-06-19: 600 mg via ORAL

## 2020-06-19 MED ORDER — CHLOROPROCAINE HCL 50 MG/5ML IT SOLN
INTRATHECAL | Status: DC | PRN
  Administered 2020-06-19: 4.8 mL via INTRATHECAL

## 2020-06-19 MED ORDER — CEFAZOLIN SODIUM-DEXTROSE 2-4 GM/100ML-% IV SOLN: 2.0000 g | Freq: Once | INTRAVENOUS | Status: DC

## 2020-06-19 MED ORDER — ONDANSETRON HCL 4 MG/2ML IJ SOLN
INTRAMUSCULAR | Status: AC
  Filled 2020-06-19: qty 2

## 2020-06-19 MED ORDER — LACTATED RINGERS IV SOLN: INTRAVENOUS | Status: DC

## 2020-06-19 SURGICAL SUPPLY — 18 items
CANISTER SUCT 3000ML PPV (MISCELLANEOUS) ×3 IMPLANT
DECANTER SPIKE VIAL GLASS SM (MISCELLANEOUS) IMPLANT
GLOVE BIOGEL PI IND STRL 7.0 (GLOVE) ×2 IMPLANT
GLOVE BIOGEL PI INDICATOR 7.0 (GLOVE) ×4
GLOVE SURG SS PI 7.0 STRL IVOR (GLOVE) ×3 IMPLANT
GOWN STRL REUS W/TWL LRG LVL3 (GOWN DISPOSABLE) ×3 IMPLANT
GOWN STRL REUS W/TWL XL LVL3 (GOWN DISPOSABLE) ×3 IMPLANT
NS IRRIG 1000ML POUR BTL (IV SOLUTION) ×3 IMPLANT
PACK VAGINAL MINOR WOMEN LF (CUSTOM PROCEDURE TRAY) ×3 IMPLANT
PAD OB MATERNITY 4.3X12.25 (PERSONAL CARE ITEMS) ×3 IMPLANT
PAD PREP 24X48 CUFFED NSTRL (MISCELLANEOUS) ×3 IMPLANT
SUT MERSILENE 5MM BP 1 12 (SUTURE) ×3 IMPLANT
SUT SILK 2 0 FSL 18 (SUTURE) ×3 IMPLANT
TOWEL OR 17X24 6PK STRL BLUE (TOWEL DISPOSABLE) ×6 IMPLANT
TRAY FOLEY W/BAG SLVR 14FR (SET/KITS/TRAYS/PACK) ×3 IMPLANT
TUBING NON-CON 1/4 X 20 CONN (TUBING) IMPLANT
TUBING NON-CON 1/4 X 20' CONN (TUBING)
YANKAUER SUCT BULB TIP NO VENT (SUCTIONS) IMPLANT

## 2020-06-19 NOTE — Transfer of Care (Signed)
Immediate Anesthesia Transfer of Care Note  Patient: Pam Vargas  Procedure(s) Performed: CERCLAGE CERVICAL (N/A Vagina )  Patient Location: PACU  Anesthesia Type:Spinal  Level of Consciousness: awake and alert   Airway & Oxygen Therapy: Patient Spontanous Breathing  Post-op Assessment: Report given to RN and Post -op Vital signs reviewed and stable  Post vital signs: Reviewed  Last Vitals:  Vitals Value Taken Time  BP 91/56 06/19/20 1325  Temp    Pulse 90 06/19/20 1327  Resp 20 06/19/20 1327  SpO2 99 % 06/19/20 1327  Vitals shown include unvalidated device data.  Last Pain:  Vitals:   06/19/20 1100  TempSrc: Oral         Complications: No complications documented.

## 2020-06-19 NOTE — Progress Notes (Signed)
17p verbal order called into [facility].

## 2020-06-19 NOTE — Op Note (Signed)
Operative Note   06/19/2020  PRE-OP DIAGNOSIS: Pregnancy at 14/6. History of cervical insufficiency. Cervical mass seen on imaging   POST-OP DIAGNOSIS: Same   SURGEON: Surgeon(s) and Role:    * Aletha Halim, MD - Primary  ASSISTANT: None  PROCEDURE: Prophylactic McDonald Cerclage with Mersilene Tape  ANESTHESIA: Spinal   ESTIMATED BLOOD LOSS: 28mL  DRAINS: Indwelling foley, UOP per anesthesia note   TOTAL IV FLUIDS: per anesthesia note  SPECIMENS: None  VTE PROPHYLAXIS: SCDs  ANTIBIOTICS: Ancef 2gm IV  COMPLICATIONS: None  DISPOSITION: PACU - hemodynamically stable.  CONDITION: stable  FINDINGS: Exam under anesthesia revealed normal appearing cervical with no obvious discolorations or protuberance noted of the cervix, as on prior new OB exam; cervix visually closed. On bimanual, cervix closed/long and high and induration still felt on the cervix at 7 o'clock at the tip of the external os.  PROCEDURE IN DETAIL:  After informed consent was obtained, the patient was taken to the operating room where anesthesia was obtained without difficulty. The patient was positioned in the dorsal lithotomy position in [place].  A Graves speculum was placed and the picture taken (see Media tab). The Graves was removed and a weight speculum placed and two Deavers used for retraction. Starting at 10 o'clock and going clockwise, a purse string stitch was placed with Mersilene tape and the knot tied four times at 12 o'clock. Next, an air knot was placed above this knot for easier identification for removal.  The patient tolerated the procedure well. The patient was taken to recovery room in excellent condition.   A dose of ibuprofen 600mg  PO x 1 will be given in the PACU and for 48 hours thereafter.   Durene Romans MD Attending Center for [employer] Fish farm manager)

## 2020-06-19 NOTE — Progress Notes (Signed)
[place] at beside, FHR 140s per bedside u/s- d/c intructions reviewed by MD, pt d/c to home, ambulatory in room with instrutctions - CWhitlockRNC

## 2020-06-19 NOTE — Anesthesia Postprocedure Evaluation (Signed)
Anesthesia Post Note  Patient: Pam Vargas  Procedure(s) Performed: CERCLAGE CERVICAL (N/A Vagina )     Patient location during evaluation: PACU Anesthesia Type: Spinal Level of consciousness: awake Vital Signs Assessment: vitals unstable Respiratory status: spontaneous breathing Cardiovascular status: stable Postop Assessment: no headache, no backache, spinal receding, patient able to bend at knees and no apparent nausea or vomiting Anesthetic complications: no   No complications documented.  Last Vitals:  Vitals:   06/19/20 1500 06/19/20 1523  BP: (!) 98/46 113/69  Pulse: 80 81  Resp: 19 (!) 86  Temp:  36.7 C  SpO2: 100% 100%    Last Pain:  Vitals:   06/19/20 1523  TempSrc: Oral  PainSc: 0-No pain   Pain Goal:    LLE Motor Response: Purposeful movement (06/19/20 1523) LLE Sensation: Full sensation (06/19/20 1523) RLE Motor Response: Purposeful movement (06/19/20 1523) RLE Sensation: Full sensation (06/19/20 1523)     Epidural/Spinal Function Cutaneous sensation: No Sensation (06/19/20 1523), Patient able to flex knees: Yes (06/19/20 1523), Patient able to lift hips off bed: Yes (06/19/20 1523), Back pain beyond tenderness at insertion site: No (06/19/20 1523), Progressively worsening motor and/or sensory loss: No (06/19/20 1523), Bowel and/or bladder incontinence post epidural: No (06/19/20 1523)  Huston Foley

## 2020-06-19 NOTE — Anesthesia Procedure Notes (Signed)
Spinal  Patient location during procedure: OR Start time: 06/19/2020 12:40 PM End time: 06/19/2020 12:42 PM Staffing Performed: anesthesiologist  Anesthesiologist: Lyn Hollingshead, MD Preanesthetic Checklist Completed: patient identified, IV checked, site marked, risks and benefits discussed, surgical consent, monitors and equipment checked, pre-op evaluation and timeout performed Spinal Block Patient position: sitting Prep: DuraPrep and site prepped and draped Patient monitoring: continuous pulse ox and blood pressure Approach: midline Location: L3-4 Injection technique: single-shot Needle Needle type: Pencan  Needle gauge: 24 G Needle length: 10 cm Needle insertion depth: 4 cm Assessment Sensory level: T10

## 2020-06-19 NOTE — Discharge Instructions (Signed)
Cervical Cerclage, Care After This sheet gives you information about how to care for yourself after your procedure. Your health care provider may also give you more specific instructions. If you have problems or questions, contact your health care provider. What can I expect after the procedure? After your procedure, it is common to have:  Cramping in your abdomen.  Mucus discharge from your vagina. This may last for several days.  Painful urination (dysuria).  Spotting, or small drops of blood coming from your vagina. Follow these instructions at home:  Medicines  Take over-the-counter and prescription medicines only as told by your health care provider.  Ask your health care provider if the medicine prescribed to you requires you to avoid driving or using heavy machinery. General instructions  Keep track of your vaginal discharge and watch for any changes. If you notice changes, tell your health care provider.  Avoid physical activities and exercise until your health care provider approves. Ask your health care provider what activities are safe for you.  Do not douche or have sex until your health care provider says it is okay to do so.  Keep all follow-up visits, including prenatal visits, as told by your health care provider. This is important. ? Prenatal visits are all the care that you receive before the birth of your baby. ? You may also need an ultrasound.  You may be asked to have weekly visits to have your cervix checked. Contact a health care provider if you:  Have abnormal discharge from your vagina, such as clots.  Have a bad-smelling discharge from your vagina.  Develop a rash on your skin. This may look like redness and swelling.  Become light-headed or feel like you are going to faint.  Have abdominal pain that does not get better with medicine.  Have nausea or vomiting that does not go away. Get help right away if you:  Have vaginal bleeding that is  heavier or more frequent than spotting.  Are leaking fluid or your water breaks.  Have a fever or chills.  Faint.  Have uterine contractions. These may feel like: ? A back ache. ? Lower abdominal pain. ? Mild cramps, similar to menstrual cramps. ? Tightening or pressure in your abdomen.  Think that your baby is not moving as much as usual, or you cannot feel your baby move.  Have chest pain.  Have shortness of breath. Summary  After the procedure, it is common to have cramping, vaginal discharge, painful urination, and small drops of blood coming from your vagina.  If you are told to go on bed rest, follow instructions from your health care provider. You may need to be on bed rest for up to 3 days.  Keep track of your vaginal discharge and watch for any changes. If you notice changes, tell your health care provider.  Contact a health care provider if you have abnormal vaginal discharge, become light-headed, or have pain that cannot be controlled with medicines.  Get help right away if you have heavy vaginal bleeding, your water breaks, or you have uterine contractions. Also, get help right away if your baby is not moving as much as usual, or you have chest pain or shortness of breath. This information is not intended to replace advice given to you by your health care provider. Make sure you discuss any questions you have with your health care provider. Document Revised: 08/13/2019 Document Reviewed: 06/12/2019 Elsevier Patient Education  2020 [employer].

## 2020-06-19 NOTE — H&P (Signed)
Obstetrics Admission History & Physical  06/19/2020 - 12:08 PM Primary OBGYN: [facility]  Chief Complaint: scheduled prophylactic cerclage  History of Present Illness  39 y.o. [id number] @ [redacted]w[redacted]d with the above CC. 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  Ms. Pam MONTONEstates that she is doing well and no bleeding or s/s of miscarriage or labor.   Review of Systems: as noted in the History of Present Illness.  PMHx:  Past Medical History:  Diagnosis Date  . Abnormal uterine bleeding (AUB) 03/23/2020  . Cervical mass 04/2020  . UTI (urinary tract infection)    PSHx:  Past Surgical History:  Procedure Laterality Date  . CESAREAN SECTION    . FOOT SURGERY    . FOOT SURGERY     Medications:  Medications Prior to Admission  Medication Sig Dispense Refill Last Dose  . acetaminophen (TYLENOL) 500 MG tablet Take 500 mg by mouth every 6 (six) hours as needed for mild pain or headache.    Past Week at Unknown time  . Prenatal MV & Min w/FA-DHA (PRENATAL ADULT GUMMY/DHA/FA) 0.4-25 MG CHEW Chew 1 Dose by mouth daily. (Patient taking differently: Chew 1 Dose by mouth in the morning, at noon, and at bedtime. ) 90 tablet 2 06/18/2020 at Unknown time  . Blood Pressure Monitoring (BLOOD PRESSURE KIT) DEVI 1 Device by Does not apply route as needed. 1 each 0      Allergies: has No Known Allergies. OBHx:  OB History  Gravida Para Term Preterm AB Living  _0 SAB TAB Ectopic Multiple Live Births  2 0 0 0 2    # Outcome Date GA Lbr Len/2nd Weight Sex Delivery Anes PTL Lv  6 Current           5 SAB 2019 860w0d       4 SAB 05/2017 6w65w0d      3 Para 11/02/16 17w34w5d Vag-Spont None  FD     Complications: Premature rupture of membranes in pregnancy  2 Preterm 08/04/04 25w01w0d g M Vag-Spont EPI  LIV     Birth Comments: had bleeding ,  went to hospital and had PTD  1 Term 02/19/98 76w0d56w0d g M CS-LTranv   LIV     Birth Comments: wnl    FHx:  Family History  Problem Relation Age of Onset  . Diabetes Mother   . Hyperlipidemia Mother   . Hypertension Mother   . Heart disease Father   . Hypertension Father   . Breast cancer Maternal Aunt    Soc Hx:  Social History   Socioeconomic History  . Marital status: Married    Spouse name: Not on file  . Number of children: Not on file  . Years of education: Not on file  . Highest education level: Not on file  Occupational History  . Not on file  Tobacco Use  . Smoking status: Current Some Day Smoker    Packs/day: 0.30    Types: Cigarettes  . Smokeless tobacco: Never Used  Vaping Use  . Vaping Use: Never used  Substance and Sexual Activity  . Alcohol use: Not Currently    Comment: last drink 04/21/2018  . Drug use: No  . Sexual activity: Not Currently    Partners: Male  Birth control/protection: None  Other Topics Concern  . Not on file  Social History Narrative   Works at [employer] as a Chartered certified accountant    Social Determinants of Radio broadcast assistant Strain:   . Difficulty of Paying Living Expenses: Not on file  Food Insecurity: No Food Insecurity  . Worried About Charity fundraiser in the Last Year: Never true  . Ran Out of Food in the Last Year: Never true  Transportation Needs: No Transportation Needs  . Lack of Transportation (Medical): No  . Lack of Transportation (Non-Medical): No  Physical Activity:   . Days of Exercise per Week: Not on file  . Minutes of Exercise per Session: Not on file  Stress:   . Feeling of Stress : Not on file  Social Connections:   . Frequency of Communication with Friends and Family: Not on file  . Frequency of Social Gatherings with Friends and Family: Not on file  . Attends Religious Services: Not on file  . Active Member of Clubs or Organizations: Not on file  . Attends Archivist Meetings:  Not on file  . Marital Status: Not on file  Intimate Partner Violence:   . Fear of Current or Ex-Partner: Not on file  . Emotionally Abused: Not on file  . Physically Abused: Not on file  . Sexually Abused: Not on file    Objective    Current Vital Signs 24h Vital Sign Ranges  T 98.5 F (36.9 C) Temp  Avg: 98.5 F (36.9 C)  Min: 98.5 F (36.9 C)  Max: 98.5 F (36.9 C)  BP (!) 114/46 BP  Min: 114/46  Max: 114/46  HR 88 Pulse  Avg: 88  Min: 88  Max: 88  RR 18 Resp  Avg: 18  Min: 18  Max: 18  SaO2 100 % Room Air SpO2  Avg: 100 %  Min: 100 %  Max: 100 %       24 Hour I/O Current Shift I/O  Time Ins Outs No intake/output data recorded. No intake/output data recorded.   FHTs: normal  General: Well nourished, well developed female in no acute distress.  Skin:  Warm and dry.  Cardiovascular: S1, S2 normal, no murmur, rub or gallop, regular rate and rhythm Respiratory:  Clear to auscultation bilateral. Normal respiratory effort Abdomen: gravid, nttp Neuro/Psych:  Normal mood and affect.    Labs  O POS  Recent Labs  Lab 06/17/20 1032  WBC 8.0  HGB 12.0  HCT 37.1  PLT 207     Radiology No new imaging  Assessment & Plan   38 y.o. [id number] @ 69w6dand doing well Reviewed with her again re: ppx cerclage and she is amenable with proceeding today   CDurene RomansMD Attending Center for [facility]

## 2020-06-19 NOTE — Anesthesia Preprocedure Evaluation (Signed)
Anesthesia Evaluation  Patient identified by MRN, date of birth, ID band Patient awake    Reviewed: Allergy & Precautions, H&P , NPO status , Patient's Chart, lab work & pertinent test results  Airway Mallampati: I  TM Distance: >3 FB Neck ROM: full    Dental no notable dental hx. (+) Teeth Intact   Pulmonary Current Smoker and Patient abstained from smoking.,    Pulmonary exam normal breath sounds clear to auscultation       Cardiovascular negative cardio ROS Normal cardiovascular exam Rhythm:regular Rate:Normal     Neuro/Psych negative neurological ROS  negative psych ROS   GI/Hepatic negative GI ROS, Neg liver ROS,   Endo/Other  negative endocrine ROS  Renal/GU negative Renal ROS  negative genitourinary   Musculoskeletal negative musculoskeletal ROS (+)   Abdominal Normal abdominal exam  (+)   Peds  Hematology negative hematology ROS (+)   Anesthesia Other Findings   Reproductive/Obstetrics (+) Pregnancy                             Anesthesia Physical Anesthesia Plan  ASA: II  Anesthesia Plan: Spinal   Post-op Pain Management:    Induction:   PONV Risk Score and Plan: Ondansetron and Dexamethasone  Airway Management Planned: Natural Airway and Nasal Cannula  Additional Equipment: None  Intra-op Plan:   Post-operative Plan:   Informed Consent: I have reviewed the patients History and Physical, chart, labs and discussed the procedure including the risks, benefits and alternatives for the proposed anesthesia with the patient or authorized representative who has indicated his/her understanding and acceptance.       Plan Discussed with: CRNA  Anesthesia Plan Comments:         Anesthesia Quick Evaluation

## 2020-06-19 NOTE — Progress Notes (Signed)
Dr Ilda Basset at bedside with ultrasound, fetal movement and cardiac activity noted - to proceed with cerclage - CWhitlockRNC

## 2020-06-22 NOTE — Discharge Summary (Signed)
Gynecology Discharge Summary Date of Admission: 06/19/2020 Date of Discharge: 06/19/2020  The patient was admitted, as scheduled, and underwent a prophylactic McDonald cerclage with Mersilene Tape at 14/6 weeks ; please refer to operative note for full details.  She was meeting all post op goals and discharged to home on POD#0  Allergies as of 06/19/2020   No Known Allergies     Medication List    TAKE these medications   acetaminophen 500 MG tablet Commonly known as: TYLENOL Take 500 mg by mouth every 6 (six) hours as needed for mild pain or headache.   Blood Pressure Kit Devi 1 Device by Does not apply route as needed.   Prenatal Adult Gummy/DHA/FA 0.4-25 MG Chew Chew 1 Dose by mouth daily. What changed: when to take this     ASK your doctor about these medications   ibuprofen 600 MG tablet Commonly known as: ADVIL Take 1 tablet (600 mg total) by mouth every 6 (six) hours for 7 doses. First dose at home: 10pm on 8/20 Ask about: Should I take this medication?       Future Appointments  Date Time Provider Dontell Mian  07/02/2020  2:35 PM Chancy Milroy, MD [facility]  07/13/2020 10:15 AM [facility] NURSE [facility]  07/13/2020 10:30 AM [facility] US3 [facility]  07/15/2020  2:35 PM Aletha Halim, MD [facility]  07/20/2020  8:00 AM [facility] NURSE [facility]  07/20/2020  8:15 AM [facility] US2 [facility] [place]    Durene Romans MD Attending Center for [place] [employer] farm manager)

## 2020-06-25 ENCOUNTER — Ambulatory Visit: Payer: Medicaid Other

## 2020-06-27 ENCOUNTER — Inpatient Hospital Stay (HOSPITAL_COMMUNITY)
Admission: AD | Admit: 2020-06-27 | Discharge: 2020-06-27 | Disposition: A | Discharge status: Home / Self Care | Payer: Medicaid Other | Attending: Family Medicine | Admitting: Family Medicine

## 2020-06-27 ENCOUNTER — Other Ambulatory Visit: Payer: Self-pay

## 2020-06-27 ENCOUNTER — Encounter (HOSPITAL_COMMUNITY): Payer: Self-pay | Admitting: Family Medicine

## 2020-06-27 DIAGNOSIS — L292 Pruritus vulvae: Secondary | ICD-10-CM | POA: Diagnosis not present

## 2020-06-27 DIAGNOSIS — L739 Follicular disorder, unspecified: Secondary | ICD-10-CM | POA: Diagnosis not present

## 2020-06-27 DIAGNOSIS — O99712 Diseases of the skin and subcutaneous tissue complicating pregnancy, second trimester: Secondary | ICD-10-CM | POA: Diagnosis not present

## 2020-06-27 DIAGNOSIS — O99332 Smoking (tobacco) complicating pregnancy, second trimester: Secondary | ICD-10-CM | POA: Insufficient documentation

## 2020-06-27 DIAGNOSIS — Z87440 Personal history of urinary (tract) infections: Secondary | ICD-10-CM | POA: Diagnosis not present

## 2020-06-27 DIAGNOSIS — F17210 Nicotine dependence, cigarettes, uncomplicated: Secondary | ICD-10-CM | POA: Diagnosis not present

## 2020-06-27 DIAGNOSIS — N898 Other specified noninflammatory disorders of vagina: Secondary | ICD-10-CM

## 2020-06-27 DIAGNOSIS — Z3A16 16 weeks gestation of pregnancy: Secondary | ICD-10-CM

## 2020-06-27 LAB — WET PREP, GENITAL
Clue Cells Wet Prep HPF POC: NONE SEEN
Sperm: NONE SEEN
Trich, Wet Prep: NONE SEEN
Yeast Wet Prep HPF POC: NONE SEEN

## 2020-06-27 LAB — URINALYSIS, ROUTINE W REFLEX MICROSCOPIC
Bilirubin Urine: NEGATIVE
Glucose, UA: NEGATIVE mg/dL
Hgb urine dipstick: NEGATIVE
Ketones, ur: NEGATIVE mg/dL
Leukocytes,Ua: NEGATIVE
Nitrite: NEGATIVE
Protein, ur: NEGATIVE mg/dL
Specific Gravity, Urine: 1.005 (ref 1.005–1.030)
pH: 7 (ref 5.0–8.0)

## 2020-06-27 MED ORDER — FLUCONAZOLE 150 MG PO TABS: 150.0000 mg | ORAL_TABLET | Freq: Every day | ORAL | 0 refills | Status: DC

## 2020-06-27 NOTE — MAU Provider Note (Addendum)
History     CSN: [id number]  Arrival date and time: 06/27/20 8657   First Provider Initiated Contact with Patient 06/27/20 [id number]      Chief Complaint  Patient presents with   Pelvic Pain   HPI Pam Vargas is a 39 y.o. G2X5284 at 13w0dwho presents with a vaginal bump and itching. She states that she is prone to getting vaginal bumps. She states this one is on her right labia. She also reports some pelvic pressure. She states she is nervous because this is around the time she lost her last baby and wants to be sure everything is ok. She reports some vaginal itching but no discharge or bleeding.   OB History    Gravida  6   Para  3   Term  1   Preterm  1   AB  2   Living  2     SAB  2   TAB  0   Ectopic  0   Multiple  0   Live Births  2           Past Medical History:  Diagnosis Date   Abnormal uterine bleeding (AUB) 03/23/2020   Cervical mass 04/2020   UTI (urinary tract infection)     Past Surgical History:  Procedure Laterality Date   CERVICAL CERCLAGE N/A 06/19/2020   Procedure: CERCLAGE CERVICAL;  Surgeon: PAletha Halim MD;  [facility]: [facility];  Service: Gynecology;  Laterality: N/A;   CESAREAN SECTION     FOOT SURGERY     FOOT SURGERY      Family History  Problem Relation Age of Onset   Diabetes Mother    Hyperlipidemia Mother    Hypertension Mother    Heart disease Father    Hypertension Father    Breast cancer Maternal Aunt     Social History   Tobacco Use   Smoking status: Current Some Day Smoker    Packs/day: 0.30    Types: Cigarettes   Smokeless tobacco: Never Used  Vaping Use   Vaping Use: Never used  Substance Use Topics   Alcohol use: Not Currently    Comment: last drink 04/21/2018   Drug use: No    Allergies: No Known Allergies  Medications Prior to Admission  Medication Sig Dispense Refill Last Dose   acetaminophen (TYLENOL) 500 MG tablet Take 500 mg by mouth every 6 (six) hours as needed  for mild pain or headache.       Blood Pressure Monitoring (BLOOD PRESSURE KIT) DEVI 1 Device by Does not apply route as needed. 1 each 0    Prenatal MV & Min w/FA-DHA (PRENATAL ADULT GUMMY/DHA/FA) 0.4-25 MG CHEW Chew 1 Dose by mouth daily. (Patient taking differently: Chew 1 Dose by mouth in the morning, at noon, and at bedtime. ) 90 tablet 2     Review of Systems  Constitutional: Negative.  Negative for fatigue and fever.  HENT: Negative.   Respiratory: Negative.  Negative for shortness of breath.   Cardiovascular: Negative.  Negative for chest pain.  Gastrointestinal: Negative.  Negative for abdominal pain, constipation, diarrhea, nausea and vomiting.  Genitourinary: Positive for pelvic pain and vaginal pain. Negative for dysuria.  Neurological: Negative.  Negative for dizziness and headaches.   Physical Exam   Blood pressure 119/61, pulse 98, temperature 98.3 F (36.8 C), temperature source Oral, resp. rate 16, last menstrual period 03/07/2020, SpO2 100 %.  Physical Exam Vitals and nursing note reviewed.  Constitutional:  General: She is not in acute distress.    Appearance: She is well-developed.  HENT:     Head: Normocephalic.  Eyes:     Pupils: Pupils are equal, round, and reactive to light.  Cardiovascular:     Rate and Rhythm: Normal rate and regular rhythm.     Heart sounds: Normal heart sounds.  Pulmonary:     Effort: Pulmonary effort is normal. No respiratory distress.     Breath sounds: Normal breath sounds.  Abdominal:     General: Bowel sounds are normal. There is no distension.     Palpations: Abdomen is soft.     Tenderness: There is no abdominal tenderness.  Genitourinary:      Comments: Cervical friability noted, cerclage in place, cervix closed Skin:    General: Skin is warm and dry.  Neurological:     Mental Status: She is alert and oriented to person, place, and time.  Psychiatric:        Behavior: Behavior normal.        Thought Content:  Thought content normal.        Judgment: Judgment normal.    FHT: 169 bpm  MAU Course  Procedures Results for orders placed or performed during the hospital encounter of 06/27/20 (from the past 24 hour(s))  Urinalysis, Routine w reflex microscopic Urine, Clean Catch     Status: Abnormal   Collection Time: 06/27/20  9:09 AM  Result Value Ref Range   Color, Urine STRAW (A) YELLOW   APPearance CLEAR CLEAR   Specific Gravity, Urine 1.005 1.005 - 1.030   pH 7.0 5.0 - 8.0   Glucose, UA NEGATIVE NEGATIVE mg/dL   Hgb urine dipstick NEGATIVE NEGATIVE   Bilirubin Urine NEGATIVE NEGATIVE   Ketones, ur NEGATIVE NEGATIVE mg/dL   Protein, ur NEGATIVE NEGATIVE mg/dL   Nitrite NEGATIVE NEGATIVE   Leukocytes,Ua NEGATIVE NEGATIVE  Wet prep, genital     Status: Abnormal   Collection Time: 06/27/20  9:30 AM   Specimen: Cervix  Result Value Ref Range   Yeast Wet Prep HPF POC NONE SEEN NONE SEEN   Trich, Wet Prep NONE SEEN NONE SEEN   Clue Cells Wet Prep HPF POC NONE SEEN NONE SEEN   WBC, Wet Prep HPF POC MANY (A) NONE SEEN   Sperm NONE SEEN    MDM UA Wet prep and gc/chlamydia  Will prophylactically treat for yeast due to symptoms. Patient requesting PO treatment, hesitant to put anything in vagina with cerclage in place.   Assessment and Plan   1. Folliculitis   2. Vaginal itching   3. [redacted] weeks gestation of pregnancy    -Discharge home in stable condition -Rx for diflucan sent to patient's pharmacy -Preterm labor precautions discussed -Patient advised to follow-up with OB as scheduled for prenatal care -Patient may return to MAU as needed or if her condition were to change or worsen   Wende Mott CNM 06/27/2020, 9:21 AM

## 2020-06-27 NOTE — MAU Note (Signed)
Pt reports to mau with c/o pelvic pressure and a bump on her vagina that is now itching and sore.  Pt denies dc, odor or vag bleeding at this time.

## 2020-06-27 NOTE — Discharge Instructions (Signed)

## 2020-06-29 DIAGNOSIS — O0990 Supervision of high risk pregnancy, unspecified, unspecified trimester: Secondary | ICD-10-CM | POA: Diagnosis not present

## 2020-06-29 LAB — GC/CHLAMYDIA PROBE AMP (~~LOC~~) NOT AT ARMC
Chlamydia: NEGATIVE
Comment: NEGATIVE
Comment: NORMAL
Neisseria Gonorrhea: NEGATIVE

## 2020-07-02 ENCOUNTER — Encounter: Payer: Self-pay | Admitting: Obstetrics and Gynecology

## 2020-07-02 ENCOUNTER — Ambulatory Visit (INDEPENDENT_AMBULATORY_CARE_PROVIDER_SITE_OTHER): Payer: Medicaid Other | Admitting: Obstetrics and Gynecology

## 2020-07-02 ENCOUNTER — Other Ambulatory Visit: Payer: Self-pay

## 2020-07-02 VITALS — BP 107/52 | HR 93 | Wt 125.7 lb

## 2020-07-02 DIAGNOSIS — Z8751 Personal history of pre-term labor: Secondary | ICD-10-CM

## 2020-07-02 DIAGNOSIS — O3432 Maternal care for cervical incompetence, second trimester: Secondary | ICD-10-CM

## 2020-07-02 DIAGNOSIS — D563 Thalassemia minor: Secondary | ICD-10-CM

## 2020-07-02 DIAGNOSIS — O0990 Supervision of high risk pregnancy, unspecified, unspecified trimester: Secondary | ICD-10-CM | POA: Diagnosis not present

## 2020-07-02 DIAGNOSIS — Z98891 History of uterine scar from previous surgery: Secondary | ICD-10-CM

## 2020-07-02 MED ORDER — HYDROXYPROGESTERONE CAPROATE 250 MG/ML IM OIL
250.0000 mg | TOPICAL_OIL | Freq: Once | INTRAMUSCULAR | Status: AC
  Administered 2020-07-02: 250 mg via INTRAMUSCULAR

## 2020-07-02 NOTE — Patient Instructions (Signed)

## 2020-07-02 NOTE — Progress Notes (Signed)
Subjective:  Pam Vargas is a 39 y.o. [id number] at [redacted]w[redacted]d being seen today for ongoing prenatal care.  She is currently monitored for the following issues for this high-risk pregnancy and has TOBACCO USER; History of preterm delivery; Dry skin; Vaginal lesion; Vaginal bleeding affecting early pregnancy; Subchorionic hemorrhage in first trimester; Supervision of high risk pregnancy, antepartum; AMA (advanced maternal age) multigravida 35+; History of C-section; History of multiple miscarriages; Cervical mass; Previous cesarean section; Alpha thalassemia silent carrier; and Cervical cerclage suture present on their problem list.  Patient reports no complaints.  Contractions: Not present. Vag. Bleeding: None.  Movement: Present. Denies leaking of fluid.   The following portions of the patient's history were reviewed and updated as appropriate: allergies, current medications, past family history, past medical history, past social history, past surgical history and problem list. Problem list updated.  Objective:   Vitals:   07/02/20 1457  BP: (!) 107/52  Pulse: 93  Weight: 125 lb 11.2 oz (57 kg)    Fetal Status: Fetal Heart Rate (bpm): 169   Movement: Present     General:  Alert, oriented and cooperative. Patient is in no acute distress.  Skin: Skin is warm and dry. No rash noted.   Cardiovascular: Normal heart rate noted  Respiratory: Normal respiratory effort, no problems with respiration noted  Abdomen: Soft, gravid, appropriate for gestational age. Pain/Pressure: Present     Pelvic:  Cervical exam deferred        Extremities: Normal range of motion.  Edema: None  Mental Status: Normal mood and affect. Normal behavior. Normal judgment and thought content.   Urinalysis:      Assessment and Plan:  Pregnancy: [id number] at [redacted]w[redacted]d  1. Supervision of high risk pregnancy, antepartum Stable Anatomy scan order - AFP, Serum, Open Spina Bifida  2. Cervical cerclage suture present in second  trimester Stable 17 OHP injections started today Pelvic rest reviewed  3. History of preterm delivery See above  4. Previous cesarean section Discuss at later visits  5. Alpha thalassemia silent carrier   Preterm labor symptoms and general obstetric precautions including but not limited to vaginal bleeding, contractions, leaking of fluid and fetal movement were reviewed in detail with the patient. Please refer to After Visit Summary for other counseling recommendations.  Return in about 4 weeks (around 07/30/2020) for OB visit, face to face, MD only.   Chancy Milroy, MD

## 2020-07-02 NOTE — Addendum Note (Signed)
Addended by: Briant Cedar B on: 07/02/2020 04:58 PM   Modules accepted: Orders

## 2020-07-04 LAB — AFP, SERUM, OPEN SPINA BIFIDA
AFP MoM: 1.55
AFP Value: 72.3 ng/mL
Gest. Age on Collection Date: 16.7 weeks
Maternal Age At EDD: 41.7 yr
OSBR Risk 1 IN: 4785
Test Results:: NEGATIVE
Weight: 125 [lb_av]

## 2020-07-07 ENCOUNTER — Encounter: Payer: Self-pay | Admitting: General Practice

## 2020-07-09 ENCOUNTER — Other Ambulatory Visit: Payer: Self-pay

## 2020-07-09 ENCOUNTER — Ambulatory Visit (INDEPENDENT_AMBULATORY_CARE_PROVIDER_SITE_OTHER): Payer: Medicaid Other | Admitting: *Deleted

## 2020-07-09 VITALS — BP 102/62 | HR 80 | Ht 64.0 in | Wt 127.4 lb

## 2020-07-09 DIAGNOSIS — O0990 Supervision of high risk pregnancy, unspecified, unspecified trimester: Secondary | ICD-10-CM

## 2020-07-09 DIAGNOSIS — Z8751 Personal history of pre-term labor: Secondary | ICD-10-CM

## 2020-07-09 MED ORDER — HYDROXYPROGESTERONE CAPROATE 250 MG/ML IM OIL
250.0000 mg | TOPICAL_OIL | Freq: Once | INTRAMUSCULAR | Status: AC
  Administered 2020-07-09: 250 mg via INTRAMUSCULAR

## 2020-07-09 NOTE — Progress Notes (Signed)
Here for 17p. FHR 160. Shot given without complaint. Has appointment for next weekly injection. Maizee Reinhold,RN

## 2020-07-09 NOTE — Progress Notes (Signed)
Patient was assessed and managed by nursing staff during this encounter. I have reviewed the chart and agree with the documentation and plan. I have also made any necessary editorial changes.  Griffin Basil, MD 07/09/2020 3:49 PM

## 2020-07-13 ENCOUNTER — Other Ambulatory Visit: Payer: Self-pay

## 2020-07-13 ENCOUNTER — Ambulatory Visit: Payer: Medicaid Other | Admitting: *Deleted

## 2020-07-13 ENCOUNTER — Other Ambulatory Visit: Payer: Self-pay | Admitting: Family Medicine

## 2020-07-13 ENCOUNTER — Encounter: Payer: Self-pay | Admitting: *Deleted

## 2020-07-13 ENCOUNTER — Ambulatory Visit: Payer: Medicaid Other | Attending: Obstetrics and Gynecology

## 2020-07-13 DIAGNOSIS — O0990 Supervision of high risk pregnancy, unspecified, unspecified trimester: Secondary | ICD-10-CM | POA: Insufficient documentation

## 2020-07-13 DIAGNOSIS — N883 Incompetence of cervix uteri: Secondary | ICD-10-CM | POA: Insufficient documentation

## 2020-07-13 DIAGNOSIS — N96 Recurrent pregnancy loss: Secondary | ICD-10-CM | POA: Diagnosis not present

## 2020-07-13 DIAGNOSIS — N889 Noninflammatory disorder of cervix uteri, unspecified: Secondary | ICD-10-CM

## 2020-07-13 DIAGNOSIS — Z3A18 18 weeks gestation of pregnancy: Secondary | ICD-10-CM

## 2020-07-13 DIAGNOSIS — Z98891 History of uterine scar from previous surgery: Secondary | ICD-10-CM | POA: Insufficient documentation

## 2020-07-13 DIAGNOSIS — O3432 Maternal care for cervical incompetence, second trimester: Secondary | ICD-10-CM

## 2020-07-13 DIAGNOSIS — O09522 Supervision of elderly multigravida, second trimester: Secondary | ICD-10-CM | POA: Insufficient documentation

## 2020-07-13 DIAGNOSIS — O2692 Pregnancy related conditions, unspecified, second trimester: Secondary | ICD-10-CM

## 2020-07-13 DIAGNOSIS — O321XX0 Maternal care for breech presentation, not applicable or unspecified: Secondary | ICD-10-CM | POA: Diagnosis not present

## 2020-07-15 ENCOUNTER — Other Ambulatory Visit: Payer: Self-pay | Admitting: Family Medicine

## 2020-07-15 ENCOUNTER — Ambulatory Visit (INDEPENDENT_AMBULATORY_CARE_PROVIDER_SITE_OTHER): Payer: Medicaid Other | Admitting: Obstetrics and Gynecology

## 2020-07-15 ENCOUNTER — Ambulatory Visit: Payer: Medicaid Other

## 2020-07-15 ENCOUNTER — Encounter: Payer: Self-pay | Admitting: Family Medicine

## 2020-07-15 ENCOUNTER — Other Ambulatory Visit: Payer: Self-pay

## 2020-07-15 VITALS — BP 108/60 | HR 83 | Wt 128.2 lb

## 2020-07-15 DIAGNOSIS — Z3A18 18 weeks gestation of pregnancy: Secondary | ICD-10-CM

## 2020-07-15 DIAGNOSIS — Z23 Encounter for immunization: Secondary | ICD-10-CM | POA: Diagnosis not present

## 2020-07-15 DIAGNOSIS — Z8751 Personal history of pre-term labor: Secondary | ICD-10-CM

## 2020-07-15 DIAGNOSIS — O3432 Maternal care for cervical incompetence, second trimester: Secondary | ICD-10-CM

## 2020-07-15 DIAGNOSIS — Z98891 History of uterine scar from previous surgery: Secondary | ICD-10-CM

## 2020-07-15 DIAGNOSIS — O0990 Supervision of high risk pregnancy, unspecified, unspecified trimester: Secondary | ICD-10-CM

## 2020-07-15 DIAGNOSIS — O09522 Supervision of elderly multigravida, second trimester: Secondary | ICD-10-CM

## 2020-07-15 MED ORDER — HYDROXYPROGESTERONE CAPROATE 250 MG/ML IM OIL
250.0000 mg | TOPICAL_OIL | Freq: Once | INTRAMUSCULAR | Status: AC
  Administered 2020-07-15: 250 mg via INTRAMUSCULAR

## 2020-07-15 MED ORDER — HYDROXYPROGESTERONE CAPROATE 275 MG/1.1ML ~~LOC~~ SOAJ: 275.0000 mg | Freq: Once | SUBCUTANEOUS | Status: DC

## 2020-07-15 MED ORDER — PRENATAL ADULT GUMMY/DHA/FA 0.4-25 MG PO CHEW
1.0000 | CHEWABLE_TABLET | Freq: Three times a day (TID) | ORAL | 2 refills | Status: DC
Start: 2020-07-15 — End: 2020-08-25

## 2020-07-15 NOTE — Progress Notes (Signed)
Prenatal Visit Note Date: 07/15/2020 Clinic: [facility]  Subjective:  Pam Vargas is a 39 y.o. [id number] at [redacted]w[redacted]d being seen today for ongoing prenatal care.  She is currently monitored for the following issues for this high-risk pregnancy and has TOBACCO USER; History of preterm delivery; Dry skin; Vaginal bleeding affecting early pregnancy; Subchorionic hemorrhage in first trimester; Supervision of high risk pregnancy, antepartum; AMA (advanced maternal age) multigravida 35+; History of multiple miscarriages; Cervical mass; Previous cesarean section; Alpha thalassemia silent carrier; and Cervical cerclage suture present on their problem list.  Patient reports no complaints.   Contractions: Not present. Vag. Bleeding: None.  Movement: Present. Denies leaking of fluid.   The following portions of the patient's history were reviewed and updated as appropriate: allergies, current medications, past family history, past medical history, past social history, past surgical history and problem list. Problem list updated.  Objective:   Vitals:   07/15/20 1435  BP: 108/60  Pulse: 83  Weight: 128 lb 3.2 oz (58.2 kg)    Fetal Status: Fetal Heart Rate (bpm): 164   Movement: Present     General:  Alert, oriented and cooperative. Patient is in no acute distress.  Skin: Skin is warm and dry. No rash noted.   Cardiovascular: Normal heart rate noted  Respiratory: Normal respiratory effort, no problems with respiration noted  Abdomen: Soft, gravid, appropriate for gestational age. Pain/Pressure: Present     Pelvic:  Cervical exam deferred        Extremities: Normal range of motion.  Edema: None  Mental Status: Normal mood and affect. Normal behavior. Normal judgment and thought content.   Urinalysis:      Assessment and Plan:  Pregnancy: [id number] at [redacted]w[redacted]d  1. Supervision of high risk pregnancy, antepartum Routine care. Anatomy u/s next week. Pt states she got 2nd covid shot  in april - Flu Vaccine QUAD 36+ mos IM  2. History of preterm delivery Continue weekly 17p - HYDROXYprogesterone caproate (Makena) autoinjector 275 mg  3. Cervical cerclage suture present in second trimester On 8/20. Normal CL a few days. Has rpt with anatomy u/s next week  4. Multigravida of advanced maternal age in second trimester No current issues  5. History of C-section D/w her re: delivery mode at later visits.    Preterm labor symptoms and general obstetric precautions including but not limited to vaginal bleeding, contractions, leaking of fluid and fetal movement were reviewed in detail with the patient. Please refer to After Visit Summary for other counseling recommendations.  Return in about 1 week (around 07/22/2020). for 17p. 8m for rob   Aletha Halim, MD

## 2020-07-15 NOTE — Progress Notes (Signed)
Refill for prenatal vitamins sent per patient request.

## 2020-07-20 ENCOUNTER — Other Ambulatory Visit: Payer: Self-pay

## 2020-07-20 ENCOUNTER — Ambulatory Visit: Payer: Medicaid Other | Admitting: *Deleted

## 2020-07-20 ENCOUNTER — Ambulatory Visit: Payer: Medicaid Other | Attending: Obstetrics and Gynecology

## 2020-07-20 ENCOUNTER — Other Ambulatory Visit: Payer: Self-pay | Admitting: *Deleted

## 2020-07-20 ENCOUNTER — Encounter: Payer: Self-pay | Admitting: *Deleted

## 2020-07-20 DIAGNOSIS — N96 Recurrent pregnancy loss: Secondary | ICD-10-CM

## 2020-07-20 DIAGNOSIS — O26892 Other specified pregnancy related conditions, second trimester: Secondary | ICD-10-CM

## 2020-07-20 DIAGNOSIS — O209 Hemorrhage in early pregnancy, unspecified: Secondary | ICD-10-CM

## 2020-07-20 DIAGNOSIS — Z98891 History of uterine scar from previous surgery: Secondary | ICD-10-CM | POA: Insufficient documentation

## 2020-07-20 DIAGNOSIS — O09522 Supervision of elderly multigravida, second trimester: Secondary | ICD-10-CM | POA: Insufficient documentation

## 2020-07-20 DIAGNOSIS — O468X1 Other antepartum hemorrhage, first trimester: Secondary | ICD-10-CM

## 2020-07-20 DIAGNOSIS — O321XX0 Maternal care for breech presentation, not applicable or unspecified: Secondary | ICD-10-CM

## 2020-07-20 DIAGNOSIS — O09529 Supervision of elderly multigravida, unspecified trimester: Secondary | ICD-10-CM | POA: Diagnosis not present

## 2020-07-20 DIAGNOSIS — O418X10 Other specified disorders of amniotic fluid and membranes, first trimester, not applicable or unspecified: Secondary | ICD-10-CM | POA: Diagnosis not present

## 2020-07-20 DIAGNOSIS — Z3A19 19 weeks gestation of pregnancy: Secondary | ICD-10-CM

## 2020-07-20 DIAGNOSIS — N888 Other specified noninflammatory disorders of cervix uteri: Secondary | ICD-10-CM | POA: Diagnosis not present

## 2020-07-20 DIAGNOSIS — O3432 Maternal care for cervical incompetence, second trimester: Secondary | ICD-10-CM

## 2020-07-20 DIAGNOSIS — O0990 Supervision of high risk pregnancy, unspecified, unspecified trimester: Secondary | ICD-10-CM

## 2020-07-20 DIAGNOSIS — O09899 Supervision of other high risk pregnancies, unspecified trimester: Secondary | ICD-10-CM | POA: Diagnosis not present

## 2020-07-20 DIAGNOSIS — O2692 Pregnancy related conditions, unspecified, second trimester: Secondary | ICD-10-CM

## 2020-07-20 DIAGNOSIS — Z148 Genetic carrier of other disease: Secondary | ICD-10-CM | POA: Diagnosis not present

## 2020-07-23 ENCOUNTER — Other Ambulatory Visit: Payer: Self-pay

## 2020-07-23 ENCOUNTER — Ambulatory Visit (INDEPENDENT_AMBULATORY_CARE_PROVIDER_SITE_OTHER): Payer: Medicaid Other | Admitting: *Deleted

## 2020-07-23 VITALS — BP 99/58 | HR 92 | Ht 64.0 in | Wt 132.6 lb

## 2020-07-23 DIAGNOSIS — Z8751 Personal history of pre-term labor: Secondary | ICD-10-CM | POA: Diagnosis not present

## 2020-07-23 DIAGNOSIS — O0990 Supervision of high risk pregnancy, unspecified, unspecified trimester: Secondary | ICD-10-CM | POA: Diagnosis not present

## 2020-07-23 MED ORDER — HYDROXYPROGESTERONE CAPROATE 250 MG/ML IM OIL
250.0000 mg | TOPICAL_OIL | Freq: Once | INTRAMUSCULAR | Status: AC
  Administered 2020-07-23: 250 mg via INTRAMUSCULAR

## 2020-07-23 NOTE — Progress Notes (Addendum)
Mardee Postin here for [name]  Injection.  Injection administered without complication. Patient will return in one week for next injection. FHR 165 I called to order next shipment of 17p.  Gwenn Teodoro,RN 07/23/2020  4:01 PM

## 2020-07-29 ENCOUNTER — Ambulatory Visit (INDEPENDENT_AMBULATORY_CARE_PROVIDER_SITE_OTHER): Payer: Medicaid Other | Admitting: General Practice

## 2020-07-29 ENCOUNTER — Other Ambulatory Visit: Payer: Self-pay

## 2020-07-29 VITALS — BP 99/39 | HR 79 | Ht 64.0 in | Wt 134.0 lb

## 2020-07-29 DIAGNOSIS — Z8751 Personal history of pre-term labor: Secondary | ICD-10-CM

## 2020-07-29 MED ORDER — HYDROXYPROGESTERONE CAPROATE 250 MG/ML IM OIL
250.0000 mg | TOPICAL_OIL | Freq: Once | INTRAMUSCULAR | Status: AC
  Administered 2020-07-29: 250 mg via INTRAMUSCULAR

## 2020-07-29 NOTE — Progress Notes (Signed)
Pam Vargas here for [name]  Injection.  Injection administered without complication. Patient will return in one week for next injection.  Derinda Late, RN 07/29/2020  4:37 PM

## 2020-07-30 ENCOUNTER — Encounter: Payer: Medicaid Other | Admitting: Obstetrics and Gynecology

## 2020-08-05 ENCOUNTER — Encounter: Payer: Self-pay | Admitting: *Deleted

## 2020-08-05 ENCOUNTER — Other Ambulatory Visit: Payer: Self-pay

## 2020-08-05 ENCOUNTER — Ambulatory Visit (INDEPENDENT_AMBULATORY_CARE_PROVIDER_SITE_OTHER): Payer: Medicaid Other | Admitting: *Deleted

## 2020-08-05 VITALS — BP 104/55 | HR 82 | Ht 64.0 in | Wt 137.9 lb

## 2020-08-05 DIAGNOSIS — Z8751 Personal history of pre-term labor: Secondary | ICD-10-CM

## 2020-08-05 MED ORDER — HYDROXYPROGESTERONE CAPROATE 250 MG/ML IM OIL
250.0000 mg | TOPICAL_OIL | Freq: Once | INTRAMUSCULAR | Status: AC
  Administered 2020-08-05: 250 mg via INTRAMUSCULAR

## 2020-08-05 NOTE — Progress Notes (Signed)
Pt here for scheduled 17P injection which was administered without difficulty. Pt tolerated well. FHR - 165 bpm per doppler, FM audible. Pt will return in one week for next 17P injection.

## 2020-08-05 NOTE — Progress Notes (Signed)
Agree with A & P. 

## 2020-08-07 ENCOUNTER — Inpatient Hospital Stay (HOSPITAL_COMMUNITY)
Admission: AD | Admit: 2020-08-07 | Discharge: 2020-08-07 | Disposition: A | Discharge status: Home / Self Care | Payer: Medicaid Other | Attending: Obstetrics and Gynecology | Admitting: Obstetrics and Gynecology

## 2020-08-07 ENCOUNTER — Other Ambulatory Visit: Payer: Self-pay

## 2020-08-07 ENCOUNTER — Encounter (HOSPITAL_COMMUNITY): Payer: Self-pay | Admitting: Obstetrics and Gynecology

## 2020-08-07 DIAGNOSIS — Z3A21 21 weeks gestation of pregnancy: Secondary | ICD-10-CM | POA: Diagnosis not present

## 2020-08-07 DIAGNOSIS — N96 Recurrent pregnancy loss: Secondary | ICD-10-CM | POA: Insufficient documentation

## 2020-08-07 DIAGNOSIS — O0992 Supervision of high risk pregnancy, unspecified, second trimester: Secondary | ICD-10-CM | POA: Diagnosis not present

## 2020-08-07 DIAGNOSIS — D563 Thalassemia minor: Secondary | ICD-10-CM | POA: Insufficient documentation

## 2020-08-07 DIAGNOSIS — R0602 Shortness of breath: Secondary | ICD-10-CM | POA: Diagnosis not present

## 2020-08-07 DIAGNOSIS — O26892 Other specified pregnancy related conditions, second trimester: Secondary | ICD-10-CM | POA: Insufficient documentation

## 2020-08-07 DIAGNOSIS — O09292 Supervision of pregnancy with other poor reproductive or obstetric history, second trimester: Secondary | ICD-10-CM

## 2020-08-07 DIAGNOSIS — R002 Palpitations: Secondary | ICD-10-CM | POA: Diagnosis not present

## 2020-08-07 DIAGNOSIS — R000 Tachycardia, unspecified: Secondary | ICD-10-CM | POA: Insufficient documentation

## 2020-08-07 DIAGNOSIS — O09522 Supervision of elderly multigravida, second trimester: Secondary | ICD-10-CM | POA: Insufficient documentation

## 2020-08-07 DIAGNOSIS — O0990 Supervision of high risk pregnancy, unspecified, unspecified trimester: Secondary | ICD-10-CM

## 2020-08-07 DIAGNOSIS — Z79899 Other long term (current) drug therapy: Secondary | ICD-10-CM | POA: Insufficient documentation

## 2020-08-07 DIAGNOSIS — O99412 Diseases of the circulatory system complicating pregnancy, second trimester: Secondary | ICD-10-CM

## 2020-08-07 DIAGNOSIS — Z87891 Personal history of nicotine dependence: Secondary | ICD-10-CM | POA: Insufficient documentation

## 2020-08-07 HISTORY — DX: Supervision of elderly multigravida, unspecified trimester: O09.529

## 2020-08-07 HISTORY — DX: Acute upper respiratory infection, unspecified: J06.9

## 2020-08-07 HISTORY — DX: Thalassemia minor: D56.3

## 2020-08-07 HISTORY — DX: Anemia, unspecified: D64.9

## 2020-08-07 HISTORY — DX: Encounter for other specified aftercare: Z51.89

## 2020-08-07 LAB — URINALYSIS, ROUTINE W REFLEX MICROSCOPIC
Bilirubin Urine: NEGATIVE
Glucose, UA: NEGATIVE mg/dL
Hgb urine dipstick: NEGATIVE
Ketones, ur: NEGATIVE mg/dL
Leukocytes,Ua: NEGATIVE
Nitrite: NEGATIVE
Protein, ur: NEGATIVE mg/dL
Specific Gravity, Urine: 1.016 (ref 1.005–1.030)
pH: 6 (ref 5.0–8.0)

## 2020-08-07 LAB — CBC WITH DIFFERENTIAL/PLATELET
Abs Immature Granulocytes: 0.13 10*3/uL — ABNORMAL HIGH (ref 0.00–0.07)
Basophils Absolute: 0 10*3/uL (ref 0.0–0.1)
Basophils Relative: 0 %
Eosinophils Absolute: 0.1 10*3/uL (ref 0.0–0.5)
Eosinophils Relative: 1 %
HCT: 35.8 % — ABNORMAL LOW (ref 36.0–46.0)
Hemoglobin: 11.7 g/dL — ABNORMAL LOW (ref 12.0–15.0)
Immature Granulocytes: 1 %
Lymphocytes Relative: 19 %
Lymphs Abs: 1.9 10*3/uL (ref 0.7–4.0)
MCH: 28.5 pg (ref 26.0–34.0)
MCHC: 32.7 g/dL (ref 30.0–36.0)
MCV: 87.3 fL (ref 80.0–100.0)
Monocytes Absolute: 0.9 10*3/uL (ref 0.1–1.0)
Monocytes Relative: 9 %
Neutro Abs: 7.1 10*3/uL (ref 1.7–7.7)
Neutrophils Relative %: 70 %
Platelets: 221 10*3/uL (ref 150–400)
RBC: 4.1 MIL/uL (ref 3.87–5.11)
RDW: 13.6 % (ref 11.5–15.5)
WBC: 10.1 10*3/uL (ref 4.0–10.5)
nRBC: 0 % (ref 0.0–0.2)

## 2020-08-07 LAB — COMPREHENSIVE METABOLIC PANEL
ALT: 13 U/L (ref 0–44)
AST: 18 U/L (ref 15–41)
Albumin: 2.6 g/dL — ABNORMAL LOW (ref 3.5–5.0)
Alkaline Phosphatase: 59 U/L (ref 38–126)
Anion gap: 8 (ref 5–15)
BUN: 9 mg/dL (ref 6–20)
CO2: 22 mmol/L (ref 22–32)
Calcium: 8.8 mg/dL — ABNORMAL LOW (ref 8.9–10.3)
Chloride: 108 mmol/L (ref 98–111)
Creatinine, Ser: 0.65 mg/dL (ref 0.44–1.00)
GFR, Estimated: >60 mL/min (ref >60)
Glucose, Bld: 83 mg/dL (ref 70–99)
Potassium: 3.8 mmol/L (ref 3.5–5.1)
Sodium: 138 mmol/L (ref 135–145)
Total Bilirubin: 0.3 mg/dL (ref 0.3–1.2)
Total Protein: 5.6 g/dL — ABNORMAL LOW (ref 6.5–8.1)

## 2020-08-07 NOTE — MAU Provider Note (Signed)
History     CSN: [id number]  Arrival date and time: 08/07/20 0907   First Provider Initiated Contact with Patient 08/07/20 1035      Chief Complaint  Patient presents with  . Shortness of Breath   Pam Vargas is a 39 y.o. G6P2 at 61w6dwho presents to MAU with complaints of SOB and palpitations. Patient reports waking up this morning around 0600 to palpitations and SOB. She reports that it felt like her heart was beating out of her chest. Patient read on google to put ice on face, patient reports doing this around 0700 and palpitations went away for about 5-10 minutes. She reports that over the course of the morning palpitations and SOB resolved but she wanted to come to make sure everything was okay. Patient reports that this happened before in 2005 with last pregnancy.   She denies any pregnancy related complaints or concerns. She denies abdominal pain, bleeding, discharge. Reports +FM.    OB History    Gravida  6   Para  3   Term  1   Preterm  1   AB  2   Living  2     SAB  2   TAB  0   Ectopic  0   Multiple  0   Live Births  2           Past Medical History:  Diagnosis Date  . Abnormal uterine bleeding (AUB) 03/23/2020  . Advanced maternal age in multigravida   . Alpha thalassemia silent carrier   . Anemia   . Blood transfusion without reported diagnosis    "I think so with my first delivery"  . Cervical mass 04/2020  . Medical history non-contributory    cerclage placed August 20,2021-recieving [name] injections weekly  . Preterm labor   . Recurrent upper respiratory infection (URI)   . UTI (urinary tract infection)     Past Surgical History:  Procedure Laterality Date  . CERVICAL CERCLAGE N/A 06/19/2020   Procedure: CERCLAGE CERVICAL;  Surgeon: PAletha Halim MD;  Location: [facility];  Service: Gynecology;  Laterality: N/A;  . CESAREAN SECTION    . FOOT SURGERY    . FOOT SURGERY      Family History  Problem Relation Age of Onset  .  Diabetes Mother   . Hyperlipidemia Mother   . Hypertension Mother   . Heart disease Father   . Hypertension Father   . Breast cancer Maternal Aunt     Social History   Tobacco Use  . Smoking status: Former Smoker    Packs/day: 0.30    Types: Cigarettes    Quit date: 05/29/2020    Years since quitting: 0.1  . Smokeless tobacco: Never Used  Vaping Use  . Vaping Use: Never used  Substance Use Topics  . Alcohol use: Not Currently    Comment: last drink 04/21/2018  . Drug use: No    Allergies: No Known Allergies  Medications Prior to Admission  Medication Sig Dispense Refill Last Dose  . acetaminophen (TYLENOL) 500 MG tablet Take 500 mg by mouth every 6 (six) hours as needed for mild pain or headache.    08/06/2020 at Unknown time  . Prenatal Vit-Fe Phos-FA-Omega (VITAFOL GUMMIES) 3.33-0.333-34.8 MG CHEW CHEW 3 GUMMIES BY MOUTH EVERY DAY 90 tablet 2 08/07/2020 at Unknown time  . Blood Pressure Monitoring (BLOOD PRESSURE KIT) DEVI 1 Device by Does not apply route as needed. 1 each 0   . Prenatal  MV & Min w/FA-DHA (PRENATAL ADULT GUMMY/DHA/FA) 0.4-25 MG CHEW Chew 1 Dose by mouth in the morning, at noon, and at bedtime. 180 tablet 2     Review of Systems  Constitutional: Negative.   Respiratory: Positive for shortness of breath.   Cardiovascular: Positive for palpitations.  Gastrointestinal: Negative.   Genitourinary: Negative.   Musculoskeletal: Negative.   Neurological: Negative.   Psychiatric/Behavioral: Negative.    Physical Exam   Blood pressure 95/63, pulse (!) 113, temperature 98.3 F (36.8 C), temperature source Oral, resp. rate 16, height '5\' 4"'  (1.626 m), weight 62.6 kg, last menstrual period 03/07/2020, SpO2 99 %.  Physical Exam Vitals and nursing note reviewed.  HENT:     Head: Normocephalic.  Cardiovascular:     Rate and Rhythm: Regular rhythm. Tachycardia present.  Pulmonary:     Effort: Pulmonary effort is normal. No respiratory distress.     Breath sounds:  Normal breath sounds. No decreased breath sounds, wheezing or rhonchi.  Abdominal:     Palpations: Abdomen is soft.     Comments: Gravid appropriate for gestational age   Skin:    General: Skin is warm and dry.     Coloration: Skin is not pale.  Neurological:     Mental Status: She is alert and oriented to person, place, and time.  Psychiatric:        Mood and Affect: Mood normal.        Behavior: Behavior normal.    FHR 155 by doppler   MAU Course  Procedures  MDM ED EKG - sinus tachycardia  Patient most likely had occurrence of SVT this morning  Recommends Cardiology referral and follow up  CBC and CMP obtained and WNL   Discussed with Dr Elgie Congo, agrees with plan of care and reviewed EKG tracing.   Assessment and Plan   1. Palpitations   2. Supervision of high risk pregnancy, antepartum   3. Multigravida of advanced maternal age in second trimester   4. History of multiple miscarriages   5. [redacted] weeks gestation of pregnancy   6. Sinus tachycardia by electrocardiography    Discharge home Follow up as scheduled in the office for prenatal care Amb referral to cardiology placed Return to MAU as needed for reasons discussed and/or emergencies    Follow-up Information    [facility].   Specialty: Cardiology Contact information: [address], [place] [address] [phone]             Allergies as of 08/07/2020   No Known Allergies     Medication List    TAKE these medications   acetaminophen 500 MG tablet Commonly known as: TYLENOL Take 500 mg by mouth every 6 (six) hours as needed for mild pain or headache.   Blood Pressure Kit Devi 1 Device by Does not apply route as needed.   Prenatal Adult Gummy/DHA/FA 0.4-25 MG Chew Chew 1 Dose by mouth in the morning, at noon, and at bedtime.   Vitafol Gummies 3.33-0.333-34.8 MG Chew CHEW 3 GUMMIES BY MOUTH EVERY DAY       Lajean Manes  CNM 08/07/2020, 12:28 PM

## 2020-08-07 NOTE — MAU Note (Addendum)
Pt is 2yr ol G6P2 with history of heart palpations with first episode 2005. Pt was evaluated by "heart doctor" and received no diagnosis or treatment that she is aware of .Pt states this morning she" felt like her heart was going to come out of her chest", pt Googled treatment and placed cold water on her face which she sates resolved the rapid heart beat for approximately 5 min. Pt denied HA, changes in her vision, SOB, or feeling light headed or dizzy. Pt denies she stopped smoking the end of August. Denies ingesting coffee, cola or any caffinated drinks. Pt denies known exposure to covid and has been vaccinated. Pt endorses + fetal movement and denies contractions, cramping, vaginal bleeding or bloody show.Pt does have a history of PTD x 2 and has cerclage that was placed August 20th

## 2020-08-07 NOTE — MAU Note (Signed)
Pam Vargas is a 39 y.o. at [redacted]w[redacted]d here in MAU reporting: woke up this AM with heart palpitations. States she put ice on her face and that helped but then it came back. Is not feeling them now. Does report some SOB. Reports no other covid symptoms. No pain, bleeding, or discharge.  Onset of complaint: today  Pain score: 0/10  Vitals:   08/07/20 0922  BP: 95/63  Pulse: (!) 113  Resp: 16  Temp: 98.3 F (36.8 C)  SpO2: 99%     FHT: 160  Lab orders placed from triage: UA

## 2020-08-12 ENCOUNTER — Ambulatory Visit (INDEPENDENT_AMBULATORY_CARE_PROVIDER_SITE_OTHER): Payer: Medicaid Other | Admitting: *Deleted

## 2020-08-12 ENCOUNTER — Other Ambulatory Visit: Payer: Self-pay

## 2020-08-12 ENCOUNTER — Encounter: Payer: Self-pay | Admitting: *Deleted

## 2020-08-12 VITALS — BP 106/64 | HR 92 | Ht 64.0 in | Wt 141.6 lb

## 2020-08-12 DIAGNOSIS — Z8751 Personal history of pre-term labor: Secondary | ICD-10-CM

## 2020-08-12 MED ORDER — HYDROXYPROGESTERONE CAPROATE 250 MG/ML IM OIL
250.0000 mg | TOPICAL_OIL | Freq: Once | INTRAMUSCULAR | Status: AC
  Administered 2020-08-12: 250 mg via INTRAMUSCULAR

## 2020-08-12 NOTE — Progress Notes (Signed)
Pt presented for 17P injection as scheduled. Administration completed and pt tolerated well. Next injection scheduled on 10/20 in addition [place] visit.

## 2020-08-13 NOTE — Progress Notes (Signed)
Patient was assessed and managed by nursing staff during this encounter. I have reviewed the chart and agree with the documentation and plan. I have also made any necessary editorial changes.  Verita Schneiders, MD 08/13/2020 1:15 PM

## 2020-08-17 ENCOUNTER — Encounter: Payer: Self-pay | Admitting: *Deleted

## 2020-08-17 ENCOUNTER — Other Ambulatory Visit: Payer: Self-pay

## 2020-08-17 ENCOUNTER — Other Ambulatory Visit: Payer: Self-pay | Admitting: Obstetrics and Gynecology

## 2020-08-17 ENCOUNTER — Ambulatory Visit: Payer: Medicaid Other | Attending: Obstetrics and Gynecology

## 2020-08-17 ENCOUNTER — Ambulatory Visit: Payer: Medicaid Other | Admitting: *Deleted

## 2020-08-17 DIAGNOSIS — O09899 Supervision of other high risk pregnancies, unspecified trimester: Secondary | ICD-10-CM

## 2020-08-17 DIAGNOSIS — O0990 Supervision of high risk pregnancy, unspecified, unspecified trimester: Secondary | ICD-10-CM | POA: Insufficient documentation

## 2020-08-17 DIAGNOSIS — Z98891 History of uterine scar from previous surgery: Secondary | ICD-10-CM | POA: Insufficient documentation

## 2020-08-17 DIAGNOSIS — Z148 Genetic carrier of other disease: Secondary | ICD-10-CM | POA: Diagnosis not present

## 2020-08-17 DIAGNOSIS — O09522 Supervision of elderly multigravida, second trimester: Secondary | ICD-10-CM

## 2020-08-17 DIAGNOSIS — Z362 Encounter for other antenatal screening follow-up: Secondary | ICD-10-CM | POA: Diagnosis not present

## 2020-08-17 DIAGNOSIS — Z3A23 23 weeks gestation of pregnancy: Secondary | ICD-10-CM

## 2020-08-17 DIAGNOSIS — N96 Recurrent pregnancy loss: Secondary | ICD-10-CM

## 2020-08-17 DIAGNOSIS — O09529 Supervision of elderly multigravida, unspecified trimester: Secondary | ICD-10-CM | POA: Insufficient documentation

## 2020-08-17 DIAGNOSIS — O3432 Maternal care for cervical incompetence, second trimester: Secondary | ICD-10-CM | POA: Insufficient documentation

## 2020-08-17 DIAGNOSIS — O34219 Maternal care for unspecified type scar from previous cesarean delivery: Secondary | ICD-10-CM

## 2020-08-17 DIAGNOSIS — O09212 Supervision of pregnancy with history of pre-term labor, second trimester: Secondary | ICD-10-CM | POA: Diagnosis not present

## 2020-08-17 DIAGNOSIS — N888 Other specified noninflammatory disorders of cervix uteri: Secondary | ICD-10-CM | POA: Diagnosis not present

## 2020-08-17 DIAGNOSIS — O99891 Other specified diseases and conditions complicating pregnancy: Secondary | ICD-10-CM

## 2020-08-18 ENCOUNTER — Other Ambulatory Visit: Payer: Self-pay | Admitting: *Deleted

## 2020-08-18 DIAGNOSIS — O09529 Supervision of elderly multigravida, unspecified trimester: Secondary | ICD-10-CM

## 2020-08-19 ENCOUNTER — Other Ambulatory Visit: Payer: Self-pay

## 2020-08-19 ENCOUNTER — Encounter: Payer: Self-pay | Admitting: *Deleted

## 2020-08-19 ENCOUNTER — Ambulatory Visit (INDEPENDENT_AMBULATORY_CARE_PROVIDER_SITE_OTHER): Payer: Medicaid Other | Admitting: Obstetrics & Gynecology

## 2020-08-19 VITALS — BP 105/56 | HR 79 | Wt 140.0 lb

## 2020-08-19 DIAGNOSIS — Z3A23 23 weeks gestation of pregnancy: Secondary | ICD-10-CM | POA: Diagnosis not present

## 2020-08-19 DIAGNOSIS — Z8751 Personal history of pre-term labor: Secondary | ICD-10-CM

## 2020-08-19 DIAGNOSIS — O09212 Supervision of pregnancy with history of pre-term labor, second trimester: Secondary | ICD-10-CM

## 2020-08-19 DIAGNOSIS — O3432 Maternal care for cervical incompetence, second trimester: Secondary | ICD-10-CM | POA: Diagnosis not present

## 2020-08-19 DIAGNOSIS — O3429 Maternal care due to uterine scar from other previous surgery: Secondary | ICD-10-CM

## 2020-08-19 DIAGNOSIS — Z98891 History of uterine scar from previous surgery: Secondary | ICD-10-CM

## 2020-08-19 MED ORDER — HYDROXYPROGESTERONE CAPROATE 250 MG/ML IM OIL
250.0000 mg | TOPICAL_OIL | Freq: Once | INTRAMUSCULAR | Status: AC
  Administered 2020-08-19: 250 mg via INTRAMUSCULAR

## 2020-08-19 NOTE — Progress Notes (Signed)
   PRENATAL VISIT NOTE  Subjective:  Pam Vargas is a 39 y.o. Q2M6381 at [redacted]w[redacted]d being seen today for ongoing prenatal care.  She is currently monitored for the following issues for this high-risk pregnancy and has TOBACCO USER; History of preterm delivery; Dry skin; Vaginal bleeding affecting early pregnancy; Subchorionic hemorrhage in first trimester; Supervision of high risk pregnancy, antepartum; AMA (advanced maternal age) multigravida 35+; History of multiple miscarriages; Cervical mass; Previous cesarean section; Alpha thalassemia silent carrier; and Cervical cerclage suture present on their problem list.  Patient reports no bleeding and no contractions.  Contractions: Not present. Vag. Bleeding: None.  Movement: Present. Denies leaking of fluid.   The following portions of the patient's history were reviewed and updated as appropriate: allergies, current medications, past family history, past medical history, past social history, past surgical history and problem list.   Objective:   Vitals:   08/19/20 1320  BP: (!) 105/56  Pulse: 79  Weight: 140 lb (63.5 kg)    Fetal Status: Fetal Heart Rate (bpm): 166   Movement: Present     General:  Alert, oriented and cooperative. Patient is in no acute distress.  Skin: Skin is warm and dry. No rash noted.   Cardiovascular: Normal heart rate noted  Respiratory: Normal respiratory effort, no problems with respiration noted  Abdomen: Soft, gravid, appropriate for gestational age.  Pain/Pressure: Present     Pelvic: Cervical exam deferred        Extremities: Normal range of motion.  Edema: None  Mental Status: Normal mood and affect. Normal behavior. Normal judgment and thought content.   Assessment and Plan:  Pregnancy: [id number] at [redacted]w[redacted]d 1. Cervical cerclage suture present in second trimester Doing well  2. History of preterm delivery cerclage  3. Previous cesarean section Unsure about TOLAC   Preterm labor symptoms and general  obstetric precautions including but not limited to vaginal bleeding, contractions, leaking of fluid and fetal movement were reviewed in detail with the patient. Please refer to After Visit Summary for other counseling recommendations.   Return in about 4 weeks (around 09/16/2020) for 2 hr GTT.  Future Appointments  Date Time Provider [place]  08/25/2020  9:00 AM Werner Lean, MD [facility]  10/12/2020  7:15 AM [facility] NURSE [facility]  10/12/2020  7:30 AM [facility] US3 [facility]    Emeterio Reeve, MD

## 2020-08-19 NOTE — Patient Instructions (Signed)

## 2020-08-19 NOTE — Addendum Note (Signed)
Addended by: Bethanne Ginger on: 08/19/2020 01:49 PM   Modules accepted: Orders

## 2020-08-25 ENCOUNTER — Encounter: Payer: Self-pay | Admitting: Internal Medicine

## 2020-08-25 ENCOUNTER — Ambulatory Visit: Payer: Medicaid Other | Admitting: Internal Medicine

## 2020-08-25 ENCOUNTER — Other Ambulatory Visit: Payer: Self-pay

## 2020-08-25 VITALS — BP 102/64 | HR 98 | Ht 64.0 in | Wt 140.8 lb

## 2020-08-25 DIAGNOSIS — R000 Tachycardia, unspecified: Secondary | ICD-10-CM

## 2020-08-25 DIAGNOSIS — R002 Palpitations: Secondary | ICD-10-CM | POA: Diagnosis not present

## 2020-08-25 NOTE — Progress Notes (Signed)
Cardiology Office Note:    Date:  08/25/2020   ID:  Pam Vargas, DOB 04-01-1981, MRN [id number]  PCP:  Danna Hefty, DO   Referring MD: Lajean Manes, CNM   CC: Palpitations Consulted for the evaluation of palpitations at the behest of Danna Hefty, DO  History of Present Illness:    Pam Vargas is a 39 y.o. female with a hx of sinus tachycardia.  Patient is ~ [redacted] weeks pregnant.  Patient notes that she has had second child in 2005.  She has a feeling as if her heart is about to beat out of her chest.  Tried to drink some water and lay down.  By the time she went to the the car her heart rate slowed down.  Patient notes that she had had three of these episodes during this pregnancy.  Has had these palpitations when not pregnant.   These episodes have occurred outside of pregnant.  No syncope.  No chest pain.  No DOE.  Past Medical History:  Diagnosis Date   Abnormal uterine bleeding (AUB) 03/23/2020   Advanced maternal age in multigravida    Alpha thalassemia silent carrier    Anemia    Blood transfusion without reported diagnosis    "I think so with my first delivery"   Cervical mass 04/2020   Medical history non-contributory    cerclage placed August 20,2021-recieving [name] injections weekly   Palpitations    Preterm labor    Recurrent upper respiratory infection (URI)    Sinus tachycardia    UTI (urinary tract infection)     Past Surgical History:  Procedure Laterality Date   CERVICAL CERCLAGE N/A 06/19/2020   Procedure: CERCLAGE CERVICAL;  Surgeon: Aletha Halim, MD;  Location: [facility];  Service: Gynecology;  Laterality: N/A;   CESAREAN SECTION     FOOT SURGERY     FOOT SURGERY     Current Medications: No outpatient medications have been marked as taking for the 08/25/20 encounter (Appointment) with Werner Lean, MD.     Allergies:   Patient has no known allergies.   Social History   Socioeconomic History    Marital status: Married    Spouse name: Not on file   Number of children: Not on file   Years of education: Not on file   Highest education level: Not on file  Occupational History   Not on file  Tobacco Use   Smoking status: Former Smoker    Packs/day: 0.30    Types: Cigarettes    Quit date: 05/29/2020    Years since quitting: 0.2   Smokeless tobacco: Never Used  Vaping Use   Vaping Use: Never used  Substance and Sexual Activity   Alcohol use: Not Currently    Comment: last drink 04/21/2018   Drug use: No   Sexual activity: Not Currently    Partners: Male    Birth control/protection: None  Other Topics Concern   Not on file  Social History Narrative   Works at [employer] as a Chartered certified accountant    Social Determinants of Radio broadcast assistant Strain:    Difficulty of Paying Living Expenses: Not on file  Food Insecurity: No Food Insecurity   Worried About Charity fundraiser in the Last Year: Never true   Arboriculturist in the Last Year: Never true  Transportation Needs: No Transportation Needs   Lack of Transportation (Medical): No   Lack of  Transportation (Non-Medical): No  Physical Activity:    Days of Exercise per Week: Not on file   Minutes of Exercise per Session: Not on file  Stress:    Feeling of Stress : Not on file  Social Connections:    Frequency of Communication with Friends and Family: Not on file   Frequency of Social Gatherings with Friends and Family: Not on file   Attends Religious Services: Not on file   Active Member of Clubs or Organizations: Not on file   Attends Archivist Meetings: Not on file   Marital Status: Not on file     Family History: The patient's family history includes Breast cancer in her maternal aunt; Diabetes in her mother; Heart disease in her father; Hyperlipidemia in her mother; Hypertension in her father and mother. Father and sister had hole in the heart (and have been  closed). Father had pacemaker and now has defibrillator. Patient unsure indications for above. No SCD, drowning's or car accidents. No history of ARVC  ROS:   Please see the history of present illness.    All other systems reviewed and are negative.  EKGs/Labs/Other Studies Reviewed:    The following studies were reviewed today:  EKG:   08/07/20 sinus tach 107 with anterior TWI and borderline LAE  Recent Labs: 03/23/2020: TSH 0.522 08/07/2020: ALT 13; BUN 9; Creatinine, Ser 0.65; Hemoglobin 11.7; Platelets 221; Potassium 3.8; Sodium 138  Recent Lipid Panel    Component Value Date/Time   CHOL 124 10/10/2007 2120   TRIG 36 10/10/2007 2120   HDL 57 10/10/2007 2120   CHOLHDL 2.2 Ratio 10/10/2007 2120   VLDL 7 10/10/2007 2120   LDLCALC 60 10/10/2007 2120    Physical Exam:    VS:  LMP 03/07/2020 (Exact Date)     Wt Readings from Last 3 Encounters:  08/19/20 140 lb (63.5 kg)  08/12/20 141 lb 9.6 oz (64.2 kg)  08/07/20 137 lb 14.4 oz (62.6 kg)     GEN:  Well nourished, well developed in no acute distress HEENT: Normal NECK: No JVD; No carotid bruits LYMPHATICS: No lymphadenopathy CARDIAC: RRR, no murmurs, rubs, gallops RESPIRATORY:  Clear to auscultation without rales, wheezing or rhonchi  ABDOMEN: Soft, non-tender, non-distended MUSCULOSKELETAL:  No edema; No deformity  SKIN: Warm and dry NEUROLOGIC:  Alert and oriented x 3 PSYCHIATRIC:  Normal affect   ASSESSMENT:    No diagnosis found. PLAN:    In order of problems listed above:  Palpitations With anterior TWIs In the setting of pregnancy Family history of PFO - will get 1 month Preventice Monitor - will get echocardiogram  3 months follow up unless new symptoms or abnormal test results warranting change in plan  Would be reasonable for Virtual Follow up Would be reasonable for APP Follow up   Medication Adjustments/Labs and Tests Ordered: Current medicines are reviewed at length with the patient  today.  Concerns regarding medicines are outlined above.  No orders of the defined types were placed in this encounter.  No orders of the defined types were placed in this encounter.   There are no Patient Instructions on file for this visit.   Signed, Werner Lean, MD  08/25/2020 7:57 AM    [place]

## 2020-08-25 NOTE — Patient Instructions (Signed)
Medication Instructions:  The current medical regimen is effective;  continue present plan and medications.  *If you need a refill on your cardiac medications before your next appointment, please call your pharmacy*  Testing/Procedures: Your physician has requested that you have an echocardiogram. Echocardiography is a painless test that uses sound waves to create images of your heart. It provides your doctor with information about the size and shape of your heart and how well your heart's chambers and valves are working. This procedure takes approximately one hour. There are no restrictions for this procedure.  Your physician has recommended that you wear an event monitor for 30 days. Event monitors are medical devices that record the heart's electrical activity. Doctors most often [place] these monitors to diagnose arrhythmias. Arrhythmias are problems with the speed or rhythm of the heartbeat. The monitor is a small, portable device. You can wear one while you do your normal daily activities. This is usually used to diagnose what is causing palpitations/syncope (passing out).  Follow-Up: At [facility], you and your health needs are our priority.  As part of our continuing mission to provide you with exceptional heart care, we have created designated Provider Care Teams.  These Care Teams include your primary Cardiologist (physician) and Advanced Practice Providers (APPs -  Physician Assistants and Nurse Practitioners) who all work together to provide you with the care you need, when you need it.  We recommend signing up for the patient portal called "MyChart".  Sign up information is provided on this After Visit Summary.  MyChart is used to connect with patients for Virtual Visits (Telemedicine).  Patients are able to view lab/test results, encounter notes, upcoming appointments, etc.  Non-urgent messages can be sent to your provider as well.   To learn more about what you can do with MyChart, go to  [email or web address].    Your next appointment:   3 month(s)  The format for your next appointment:   In Person  Provider:   Rudean Haskell, MD   Thank you for choosing [facility]!!

## 2020-08-26 ENCOUNTER — Encounter: Payer: Self-pay | Admitting: *Deleted

## 2020-08-26 ENCOUNTER — Ambulatory Visit (INDEPENDENT_AMBULATORY_CARE_PROVIDER_SITE_OTHER): Payer: Medicaid Other | Admitting: *Deleted

## 2020-08-26 VITALS — BP 104/63 | HR 95 | Ht 64.0 in | Wt 141.6 lb

## 2020-08-26 DIAGNOSIS — Z8751 Personal history of pre-term labor: Secondary | ICD-10-CM

## 2020-08-26 DIAGNOSIS — O3432 Maternal care for cervical incompetence, second trimester: Secondary | ICD-10-CM | POA: Diagnosis not present

## 2020-08-26 MED ORDER — DOCUSATE SODIUM 100 MG PO CAPS: 100.0000 mg | ORAL_CAPSULE | Freq: Two times a day (BID) | ORAL | 1 refills | Status: DC

## 2020-08-26 MED ORDER — HYDROXYPROGESTERONE CAPROATE 250 MG/ML IM OIL
250.0000 mg | TOPICAL_OIL | Freq: Once | INTRAMUSCULAR | Status: AC
  Administered 2020-08-26: 250 mg via INTRAMUSCULAR

## 2020-08-26 NOTE — Progress Notes (Addendum)
17P administered as scheduled. Pt tolerated well. FHR - 168 per doppler. Pt reports constipation. I advised her to increase po fluids, add extra fruits and vegetables to diet. Colace Rx e-prescribed per standing order. Pt was instructed on dosing instructions. She voiced understanding of all information and instructions given.    Chart reviewed for nurse visit. Agree with plan of care.   Virginia Rochester, NP 08/26/2020 3:27 PM

## 2020-08-27 ENCOUNTER — Encounter: Payer: Self-pay | Admitting: Family Medicine

## 2020-08-28 ENCOUNTER — Inpatient Hospital Stay (HOSPITAL_COMMUNITY)
Admission: AD | Admit: 2020-08-28 | Discharge: 2020-08-28 | DRG: 831 | Disposition: A | Discharge status: Other Acute Inpatient Hospital | Payer: Medicaid Other | Attending: Obstetrics & Gynecology | Admitting: Obstetrics & Gynecology

## 2020-08-28 ENCOUNTER — Inpatient Hospital Stay (HOSPITAL_BASED_OUTPATIENT_CLINIC_OR_DEPARTMENT_OTHER): Payer: Medicaid Other

## 2020-08-28 ENCOUNTER — Ambulatory Visit: Payer: Self-pay

## 2020-08-28 ENCOUNTER — Encounter (HOSPITAL_COMMUNITY): Payer: Self-pay | Admitting: Obstetrics & Gynecology

## 2020-08-28 DIAGNOSIS — Z20822 Contact with and (suspected) exposure to covid-19: Secondary | ICD-10-CM | POA: Diagnosis present

## 2020-08-28 DIAGNOSIS — O42912 Preterm premature rupture of membranes, unspecified as to length of time between rupture and onset of labor, second trimester: Principal | ICD-10-CM | POA: Diagnosis present

## 2020-08-28 DIAGNOSIS — O09522 Supervision of elderly multigravida, second trimester: Secondary | ICD-10-CM

## 2020-08-28 DIAGNOSIS — N841 Polyp of cervix uteri: Secondary | ICD-10-CM | POA: Diagnosis not present

## 2020-08-28 DIAGNOSIS — O34219 Maternal care for unspecified type scar from previous cesarean delivery: Secondary | ICD-10-CM | POA: Diagnosis present

## 2020-08-28 DIAGNOSIS — I471 Supraventricular tachycardia, unspecified: Secondary | ICD-10-CM

## 2020-08-28 DIAGNOSIS — O3432 Maternal care for cervical incompetence, second trimester: Secondary | ICD-10-CM

## 2020-08-28 DIAGNOSIS — Z87891 Personal history of nicotine dependence: Secondary | ICD-10-CM | POA: Diagnosis not present

## 2020-08-28 DIAGNOSIS — R000 Tachycardia, unspecified: Secondary | ICD-10-CM

## 2020-08-28 DIAGNOSIS — Z3A34 34 weeks gestation of pregnancy: Secondary | ICD-10-CM

## 2020-08-28 DIAGNOSIS — D563 Thalassemia minor: Secondary | ICD-10-CM | POA: Diagnosis present

## 2020-08-28 DIAGNOSIS — Z3A26 26 weeks gestation of pregnancy: Secondary | ICD-10-CM | POA: Diagnosis not present

## 2020-08-28 DIAGNOSIS — O99824 Streptococcus B carrier state complicating childbirth: Secondary | ICD-10-CM | POA: Diagnosis not present

## 2020-08-28 DIAGNOSIS — O9942 Diseases of the circulatory system complicating childbirth: Secondary | ICD-10-CM | POA: Diagnosis not present

## 2020-08-28 DIAGNOSIS — Z148 Genetic carrier of other disease: Secondary | ICD-10-CM

## 2020-08-28 DIAGNOSIS — O09212 Supervision of pregnancy with history of pre-term labor, second trimester: Secondary | ICD-10-CM | POA: Diagnosis not present

## 2020-08-28 DIAGNOSIS — O99891 Other specified diseases and conditions complicating pregnancy: Secondary | ICD-10-CM

## 2020-08-28 DIAGNOSIS — O4290 Premature rupture of membranes, unspecified as to length of time between rupture and onset of labor, unspecified weeks of gestation: Secondary | ICD-10-CM | POA: Diagnosis present

## 2020-08-28 DIAGNOSIS — Z3A24 24 weeks gestation of pregnancy: Secondary | ICD-10-CM | POA: Diagnosis not present

## 2020-08-28 DIAGNOSIS — O42913 Preterm premature rupture of membranes, unspecified as to length of time between rupture and onset of labor, third trimester: Secondary | ICD-10-CM | POA: Diagnosis not present

## 2020-08-28 DIAGNOSIS — O26892 Other specified pregnancy related conditions, second trimester: Secondary | ICD-10-CM | POA: Diagnosis not present

## 2020-08-28 DIAGNOSIS — Z362 Encounter for other antenatal screening follow-up: Secondary | ICD-10-CM | POA: Diagnosis not present

## 2020-08-28 DIAGNOSIS — I491 Atrial premature depolarization: Secondary | ICD-10-CM | POA: Diagnosis not present

## 2020-08-28 HISTORY — DX: Premature rupture of membranes, unspecified as to length of time between rupture and onset of labor, unspecified weeks of gestation: O42.90

## 2020-08-28 LAB — CBC
HCT: 37.6 % (ref 36.0–46.0)
Hemoglobin: 12 g/dL (ref 12.0–15.0)
MCH: 27.7 pg (ref 26.0–34.0)
MCHC: 31.9 g/dL (ref 30.0–36.0)
MCV: 86.8 fL (ref 80.0–100.0)
Platelets: 230 10*3/uL (ref 150–400)
RBC: 4.33 MIL/uL (ref 3.87–5.11)
RDW: 13.4 % (ref 11.5–15.5)
WBC: 13.7 10*3/uL — ABNORMAL HIGH (ref 4.0–10.5)
nRBC: 0 % (ref 0.0–0.2)

## 2020-08-28 LAB — URINALYSIS, ROUTINE W REFLEX MICROSCOPIC
Bilirubin Urine: NEGATIVE
Glucose, UA: NEGATIVE mg/dL
Hgb urine dipstick: NEGATIVE
Ketones, ur: 5 mg/dL — AB
Nitrite: NEGATIVE
Protein, ur: 100 mg/dL — AB
Specific Gravity, Urine: 1.019 (ref 1.005–1.030)
WBC, UA: >50 WBC/hpf — ABNORMAL HIGH (ref 0–5)
pH: 6 (ref 5.0–8.0)

## 2020-08-28 LAB — RESPIRATORY PANEL BY RT PCR (FLU A&B, COVID)
Influenza A by PCR: NEGATIVE
Influenza B by PCR: NEGATIVE
SARS Coronavirus 2 by RT PCR: NEGATIVE

## 2020-08-28 LAB — TYPE AND SCREEN
ABO/RH(D): O POS
Antibody Screen: NEGATIVE

## 2020-08-28 MED ORDER — BETAMETHASONE SOD PHOS & ACET 6 (3-3) MG/ML IJ SUSP
12.0000 mg | Freq: Once | INTRAMUSCULAR | Status: DC
Start: 2020-08-28 — End: 2020-08-28

## 2020-08-28 MED ORDER — PRENATAL MULTIVITAMIN CH: 1.0000 | ORAL_TABLET | Freq: Every day | ORAL | Status: DC

## 2020-08-28 MED ORDER — BETAMETHASONE SOD PHOS & ACET 6 (3-3) MG/ML IJ SUSP
12.0000 mg | INTRAMUSCULAR | Status: DC
  Administered 2020-08-28: 12 mg via INTRAMUSCULAR
  Filled 2020-08-28: qty 5

## 2020-08-28 MED ORDER — METOPROLOL TARTRATE 5 MG/5ML IV SOLN
5.0000 mg | INTRAVENOUS | Status: AC
  Administered 2020-08-28: 5 mg via INTRAVENOUS
  Filled 2020-08-28: qty 5

## 2020-08-28 MED ORDER — SODIUM CHLORIDE 0.9 % IV SOLN
2.0000 g | Freq: Four times a day (QID) | INTRAVENOUS | Status: DC
  Administered 2020-08-28: 2 g via INTRAVENOUS
  Filled 2020-08-28: qty 2000

## 2020-08-28 MED ORDER — MAGNESIUM SULFATE 40 GM/1000ML IV SOLN
2.0000 g/h | INTRAVENOUS | Status: DC
  Filled 2020-08-28: qty 1000

## 2020-08-28 MED ORDER — AMOXICILLIN 500 MG PO CAPS: 500.0000 mg | ORAL_CAPSULE | Freq: Three times a day (TID) | ORAL | Status: DC

## 2020-08-28 MED ORDER — ACETAMINOPHEN 325 MG PO TABS: 650.0000 mg | ORAL_TABLET | ORAL | Status: DC | PRN

## 2020-08-28 MED ORDER — CALCIUM CARBONATE ANTACID 500 MG PO CHEW: 2.0000 | CHEWABLE_TABLET | ORAL | Status: DC | PRN

## 2020-08-28 MED ORDER — MAGNESIUM SULFATE BOLUS VIA INFUSION
4.0000 g | Freq: Once | INTRAVENOUS | Status: AC
  Administered 2020-08-28: 4 g via INTRAVENOUS
  Filled 2020-08-28: qty 1000

## 2020-08-28 MED ORDER — ZOLPIDEM TARTRATE 5 MG PO TABS: 5.0000 mg | ORAL_TABLET | Freq: Every evening | ORAL | Status: DC | PRN

## 2020-08-28 MED ORDER — AZITHROMYCIN 250 MG PO TABS
1000.0000 mg | ORAL_TABLET | Freq: Once | ORAL | Status: AC
  Administered 2020-08-28: 1000 mg via ORAL
  Filled 2020-08-28: qty 4

## 2020-08-28 MED ORDER — LACTATED RINGERS IV SOLN
INTRAVENOUS | Status: DC
  Administered 2020-08-28: 1000 mL via INTRAVENOUS

## 2020-08-28 MED ORDER — DOCUSATE SODIUM 100 MG PO CAPS: 100.0000 mg | ORAL_CAPSULE | Freq: Every day | ORAL | Status: DC

## 2020-08-28 NOTE — MAU Note (Signed)
Felt like she had to pee, went to the bathroom and a gush of fluid came out.  Happened again, called dr. Sherrin Daisy to lay down for 69min, while she was laying down, more fluid came out.  Only pain is oral. (gums are swollen)

## 2020-08-28 NOTE — MAU Note (Signed)
[facility] arrived at 1945-transportation explained to pt and husband-pt agreeable to go but does desire to be transferred back to [facility] if a bed becomes available for the baby. Reviewed previous report with Rich RN with update on BP and HR. Pt continues to deny pain, contractions, or cramping. No vaginal bleeding or bloody show. FHR tracing remains appropriate for gestational age. Uterus is soft and non tender.

## 2020-08-28 NOTE — MAU Note (Signed)
On arrival to room - pt requesting peri pad. Pad noted to be saturated with clear fluid. No bleeding or bloody show noted. Pt denies bleeding or bloody show prior to arrival. Denies cramping or uterine contractions. Uterus is soft and non tender. Reports + fetal movement.

## 2020-08-28 NOTE — MAU Provider Note (Addendum)
History     CSN: [id number]  Arrival date and time: 08/28/20 1439   First Provider Initiated Contact with Patient 08/28/20 1604      Chief Complaint  Patient presents with  . Rupture of Membranes    10/29/20121 at 1230 pt reports large gush of clear fluid   [name] is a 38 y.o. year old [phone] female at 52w6dweeks gestation who presents to MAU reporting she felt like she had to pee, went to the bathroom and a gush of fluid came out @ 1230. She reports that the gush happened again. She called her OB office and was instructed to lay down for 15 minutes. While she was laying down the gush occurred again. She denies pain; only gum pain from tooth pain. Her pregnancy is high risk for incompetent cervix with McDonald cerclage (placed 05/2020), cervical funneling noted with cervix 1.9 cm in 07/2020 and weekly Makena injections (next injection 09/02/2020). [facility].   OB History    Gravida  6   Para  3   Term  1   Preterm  1   AB  2   Living  2     SAB  2   TAB  0   Ectopic  0   Multiple  0   Live Births  2           Past Medical History:  Diagnosis Date  . Abnormal uterine bleeding (AUB) 03/23/2020  . Advanced maternal age in multigravida   . Alpha thalassemia silent carrier   . Anemia   . Blood transfusion without reported diagnosis    "I think so with my first delivery"  . Cervical mass 04/2020  . Medical history non-contributory    cerclage placed August 20,2021-recieving [name] injections weekly  . Palpitations   . Preterm labor   . Recurrent upper respiratory infection (URI)   . Sinus tachycardia   . UTI (urinary tract infection)     Past Surgical History:  Procedure Laterality Date  . CERVICAL CERCLAGE N/A 06/19/2020   Procedure: CERCLAGE CERVICAL;  Surgeon: PAletha Halim MD;  Location: [facility];  Service: Gynecology;  Laterality:  N/A;  . CESAREAN SECTION    . FOOT SURGERY    . FOOT SURGERY      Family History  Problem Relation Age of Onset  . Diabetes Mother   . Hyperlipidemia Mother   . Hypertension Mother   . Heart disease Father   . Hypertension Father   . Breast cancer Maternal Aunt     Social History   Tobacco Use  . Smoking status: Former Smoker    Packs/day: 0.30    Types: Cigarettes    Quit date: 05/29/2020    Years since quitting: 0.2  . Smokeless tobacco: Never Used  Vaping Use  . Vaping Use: Never used  Substance Use Topics  . Alcohol use: Not Currently    Comment: last drink 04/21/2018  . Drug use: No    Allergies: No Known Allergies  Medications Prior to Admission  Medication Sig Dispense Refill Last Dose  . acetaminophen (TYLENOL) 500 MG tablet Take 500 mg by mouth every 6 (six) hours as needed for mild pain or headache.    08/28/2020 at 0730  . hydroxyprogesterone caproate (MAKENA) 250 mg/mL OIL injection Inject into the muscle once a week.   Past Week at Unknown time  .  Prenatal Vit-Fe Phos-FA-Omega (VITAFOL GUMMIES) 3.33-0.333-34.8 MG CHEW CHEW 3 GUMMIES BY MOUTH EVERY DAY 90 tablet 2 08/28/2020 at 0730  . Blood Pressure Monitoring (BLOOD PRESSURE KIT) DEVI 1 Device by Does not apply route as needed. 1 each 0   . docusate sodium (COLACE) 100 MG capsule Take 1 capsule (100 mg total) by mouth 2 (two) times daily. 60 capsule 1     Review of Systems  Constitutional: Negative.   HENT: Negative.   Eyes: Negative.   Respiratory: Negative.   Cardiovascular: Positive for palpitations.  Gastrointestinal: Negative.   Endocrine: Negative.   Genitourinary: Positive for vaginal discharge (fluid since 1230 today).  Musculoskeletal: Negative.   Skin: Negative.   Allergic/Immunologic: Negative.   Neurological: Negative.   Hematological: Negative.   Psychiatric/Behavioral: Negative.    Physical Exam   Blood pressure (!) 114/50, pulse 98, temperature 98.2 F (36.8 C), temperature  source Oral, resp. rate 18, height _0  (1.626 m), weight 64 kg, last menstrual period 03/07/2020, SpO2 99 %.  Physical Exam Vitals and nursing note reviewed. Exam conducted with a chaperone present.  Constitutional:      Appearance: Normal appearance. She is normal weight.  HENT:     Head: Normocephalic and atraumatic.  Cardiovascular:     Rate and Rhythm: Tachycardia present.  Pulmonary:     Effort: Pulmonary effort is normal.  Genitourinary:    General: Normal vulva.     Comments: SSE: copious amounts of yellowish-green fluid with white particulate matter floating in it filled up speculum, cervix appears to be visually closed, cerclage appears to be in place Musculoskeletal:        General: Normal range of motion.     Cervical back: Normal range of motion.  Skin:    General: Skin is warm and dry.  Neurological:     Mental Status: She is alert and oriented to person, place, and time.  Psychiatric:        Mood and Affect: Mood normal.        Behavior: Behavior normal.        Thought Content: Thought content normal.        Judgment: Judgment normal.     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 No sign of PTL, fetal HR normal. She has responded to treatment for tachycardia and feels better.  NICU is reported as closed to new preterm admissions. Pam Vargas [facility] accepted transfer to [facility] triage. She will receive ABX, BMZ and magnesium for transfer  Woodroe Mode, MD 08/28/2020 5:18 PM

## 2020-08-28 NOTE — MAU Note (Addendum)
Dr.Bass called regarding pts HR-consulting with Dr.Stinson-due to BP and pt continues to be asymptomatic-without SOB, chest pain or pressure.- will hold any additional medications for tachycardia at this time Will continue to monitor

## 2020-08-28 NOTE — Telephone Encounter (Signed)
  Pt. Reports she is [redacted] weeks pregnant. Today has had 2 gushes of clear fluid. No pain. Instructed to call her OB/GYN. Verbalizes understanding. Answer Assessment - Initial Assessment Questions 1. PREGNANCY: "Do you know how many weeks or months pregnant you are?"      24 2. EDD: "What date are you expecting to deliver?"     n/a 3. BLOOD PRESSURE: "Do you have high blood pressure before or during this pregnancy?" (Note to Triager: High blood pressure may elevate acuity with minor symptoms)     No 4. PLACENTA LOCATION: "Is your placenta abnormally placed?" (Note to Triager: Placenta previa patients have higher risk for severe bleeding and should be sent in now for any bleeding)     n/a 5. MAIN SYMPTOM: "What are you most concerned about?" When did it start?"     Had a gush of clear fluid. 6. VAGINAL BLEEDING: If present, ask: "How bad is it?" (Mild, Moderate or Severe. See Severity definitions).     None 7. ABDOMINAL PAIN: If present, ask: "How bad is it?" (Mild, Moderate or Severe. See Severity definitions).     None 8. VOMITING: If present, ask: "How bad is it?" (Mild, Moderate or Severe. See Severity definitions).     None 9. LEG SWELLING (EDEMA): If present, ask: "How bad is it?" (Mild, Moderate or Severe. See Severity definitions).     None 10. FEVER: "Do you have a fever?" If so, ask: "What is it, how was it measured, and when did it start?"        None 11. OTHER SYMPTOMS: "Do you have any other symptoms?" (e.g., vision changes, chest pain, trouble breathing)       No  Protocols used: PREGNANCY QUESTIONS-P-AH

## 2020-08-28 NOTE — MAU Note (Signed)
Awaiting Metoprolol from paharmacy

## 2020-08-28 NOTE — MAU Note (Signed)
5mg  metoprolol given per order dose verified with Dr.Stinson prior to administration. Pt agreeable and understands purpose of medication

## 2020-08-28 NOTE — MAU Note (Signed)
Tachycardia noted-pt states she feels "her hear "racing"-pt using straw to blow-valsalva.CNM made aware. Pt denies feeling chest pain or pressure. Denies SOB. O2 sats 99%

## 2020-08-29 ENCOUNTER — Other Ambulatory Visit: Payer: Self-pay | Admitting: Obstetrics and Gynecology

## 2020-08-29 DIAGNOSIS — O34219 Maternal care for unspecified type scar from previous cesarean delivery: Secondary | ICD-10-CM | POA: Diagnosis not present

## 2020-08-29 DIAGNOSIS — O09522 Supervision of elderly multigravida, second trimester: Secondary | ICD-10-CM | POA: Diagnosis not present

## 2020-08-29 DIAGNOSIS — O09212 Supervision of pregnancy with history of pre-term labor, second trimester: Secondary | ICD-10-CM | POA: Diagnosis not present

## 2020-08-29 DIAGNOSIS — Z3A24 24 weeks gestation of pregnancy: Secondary | ICD-10-CM | POA: Diagnosis not present

## 2020-08-29 DIAGNOSIS — O3432 Maternal care for cervical incompetence, second trimester: Secondary | ICD-10-CM | POA: Diagnosis not present

## 2020-08-29 DIAGNOSIS — O42912 Preterm premature rupture of membranes, unspecified as to length of time between rupture and onset of labor, second trimester: Secondary | ICD-10-CM | POA: Diagnosis not present

## 2020-08-29 DIAGNOSIS — O26892 Other specified pregnancy related conditions, second trimester: Secondary | ICD-10-CM | POA: Diagnosis not present

## 2020-08-29 DIAGNOSIS — R000 Tachycardia, unspecified: Secondary | ICD-10-CM | POA: Diagnosis not present

## 2020-08-29 DIAGNOSIS — Z3A26 26 weeks gestation of pregnancy: Secondary | ICD-10-CM | POA: Diagnosis not present

## 2020-08-30 DIAGNOSIS — O3432 Maternal care for cervical incompetence, second trimester: Secondary | ICD-10-CM | POA: Diagnosis not present

## 2020-08-30 DIAGNOSIS — O34219 Maternal care for unspecified type scar from previous cesarean delivery: Secondary | ICD-10-CM | POA: Diagnosis not present

## 2020-08-30 DIAGNOSIS — I440 Atrioventricular block, first degree: Secondary | ICD-10-CM | POA: Diagnosis not present

## 2020-08-30 DIAGNOSIS — O09522 Supervision of elderly multigravida, second trimester: Secondary | ICD-10-CM | POA: Diagnosis not present

## 2020-08-30 DIAGNOSIS — O09212 Supervision of pregnancy with history of pre-term labor, second trimester: Secondary | ICD-10-CM | POA: Diagnosis not present

## 2020-08-30 DIAGNOSIS — O26892 Other specified pregnancy related conditions, second trimester: Secondary | ICD-10-CM | POA: Diagnosis not present

## 2020-08-30 DIAGNOSIS — R000 Tachycardia, unspecified: Secondary | ICD-10-CM | POA: Diagnosis not present

## 2020-08-30 DIAGNOSIS — O42912 Preterm premature rupture of membranes, unspecified as to length of time between rupture and onset of labor, second trimester: Secondary | ICD-10-CM | POA: Diagnosis not present

## 2020-08-30 DIAGNOSIS — Z3A24 24 weeks gestation of pregnancy: Secondary | ICD-10-CM | POA: Diagnosis not present

## 2020-08-30 DIAGNOSIS — Z950 Presence of cardiac pacemaker: Secondary | ICD-10-CM | POA: Diagnosis not present

## 2020-08-30 LAB — CULTURE, BETA STREP (GROUP B ONLY)

## 2020-08-31 ENCOUNTER — Telehealth: Payer: Self-pay | Admitting: Family Medicine

## 2020-08-31 ENCOUNTER — Telehealth (HOSPITAL_COMMUNITY): Payer: Self-pay | Admitting: Internal Medicine

## 2020-08-31 ENCOUNTER — Encounter: Payer: Self-pay | Admitting: Family Medicine

## 2020-08-31 DIAGNOSIS — O365920 Maternal care for other known or suspected poor fetal growth, second trimester, not applicable or unspecified: Secondary | ICD-10-CM | POA: Diagnosis not present

## 2020-08-31 DIAGNOSIS — O09212 Supervision of pregnancy with history of pre-term labor, second trimester: Secondary | ICD-10-CM | POA: Diagnosis not present

## 2020-08-31 DIAGNOSIS — R000 Tachycardia, unspecified: Secondary | ICD-10-CM | POA: Diagnosis not present

## 2020-08-31 DIAGNOSIS — Z3A24 24 weeks gestation of pregnancy: Secondary | ICD-10-CM | POA: Diagnosis not present

## 2020-08-31 DIAGNOSIS — O3432 Maternal care for cervical incompetence, second trimester: Secondary | ICD-10-CM | POA: Diagnosis not present

## 2020-08-31 DIAGNOSIS — O42912 Preterm premature rupture of membranes, unspecified as to length of time between rupture and onset of labor, second trimester: Secondary | ICD-10-CM | POA: Diagnosis not present

## 2020-08-31 DIAGNOSIS — O99820 Streptococcus B carrier state complicating pregnancy: Secondary | ICD-10-CM | POA: Insufficient documentation

## 2020-08-31 DIAGNOSIS — O09522 Supervision of elderly multigravida, second trimester: Secondary | ICD-10-CM | POA: Diagnosis not present

## 2020-08-31 DIAGNOSIS — O26892 Other specified pregnancy related conditions, second trimester: Secondary | ICD-10-CM | POA: Diagnosis not present

## 2020-08-31 DIAGNOSIS — O34219 Maternal care for unspecified type scar from previous cesarean delivery: Secondary | ICD-10-CM | POA: Diagnosis not present

## 2020-08-31 HISTORY — DX: Streptococcus B carrier state complicating pregnancy: O99.820

## 2020-08-31 NOTE — Telephone Encounter (Signed)
Patient called to cancel her up coming Due to her water broke and she is at [facility], patient will call us once is out of the hospital

## 2020-08-31 NOTE — Telephone Encounter (Signed)
08/31/20 patient cancelled echocardiogram  due to she is in the hospital due to pregnancy issues and will be for 1 month and will at a later date to reschedule/LBW Order will be removed from the [facility] and when she calls back top rescheduel we will reinstate the order.

## 2020-09-01 DIAGNOSIS — Z3A24 24 weeks gestation of pregnancy: Secondary | ICD-10-CM | POA: Diagnosis not present

## 2020-09-01 DIAGNOSIS — O3432 Maternal care for cervical incompetence, second trimester: Secondary | ICD-10-CM | POA: Diagnosis not present

## 2020-09-01 DIAGNOSIS — O09212 Supervision of pregnancy with history of pre-term labor, second trimester: Secondary | ICD-10-CM | POA: Diagnosis not present

## 2020-09-01 DIAGNOSIS — O42912 Preterm premature rupture of membranes, unspecified as to length of time between rupture and onset of labor, second trimester: Secondary | ICD-10-CM | POA: Diagnosis not present

## 2020-09-01 DIAGNOSIS — O09522 Supervision of elderly multigravida, second trimester: Secondary | ICD-10-CM | POA: Diagnosis not present

## 2020-09-01 DIAGNOSIS — R000 Tachycardia, unspecified: Secondary | ICD-10-CM | POA: Diagnosis not present

## 2020-09-01 DIAGNOSIS — O34219 Maternal care for unspecified type scar from previous cesarean delivery: Secondary | ICD-10-CM | POA: Diagnosis not present

## 2020-09-01 DIAGNOSIS — O26892 Other specified pregnancy related conditions, second trimester: Secondary | ICD-10-CM | POA: Diagnosis not present

## 2020-09-02 ENCOUNTER — Ambulatory Visit: Payer: Medicaid Other

## 2020-09-02 DIAGNOSIS — O42912 Preterm premature rupture of membranes, unspecified as to length of time between rupture and onset of labor, second trimester: Secondary | ICD-10-CM | POA: Diagnosis not present

## 2020-09-02 DIAGNOSIS — Z3A25 25 weeks gestation of pregnancy: Secondary | ICD-10-CM | POA: Diagnosis not present

## 2020-09-03 DIAGNOSIS — O365920 Maternal care for other known or suspected poor fetal growth, second trimester, not applicable or unspecified: Secondary | ICD-10-CM | POA: Diagnosis not present

## 2020-09-03 DIAGNOSIS — O26892 Other specified pregnancy related conditions, second trimester: Secondary | ICD-10-CM | POA: Diagnosis not present

## 2020-09-03 DIAGNOSIS — O42912 Preterm premature rupture of membranes, unspecified as to length of time between rupture and onset of labor, second trimester: Secondary | ICD-10-CM | POA: Diagnosis not present

## 2020-09-03 DIAGNOSIS — O3432 Maternal care for cervical incompetence, second trimester: Secondary | ICD-10-CM | POA: Diagnosis not present

## 2020-09-03 DIAGNOSIS — Z3A24 24 weeks gestation of pregnancy: Secondary | ICD-10-CM | POA: Diagnosis not present

## 2020-09-03 DIAGNOSIS — O34219 Maternal care for unspecified type scar from previous cesarean delivery: Secondary | ICD-10-CM | POA: Diagnosis not present

## 2020-09-03 DIAGNOSIS — R000 Tachycardia, unspecified: Secondary | ICD-10-CM | POA: Diagnosis not present

## 2020-09-03 DIAGNOSIS — Z3A25 25 weeks gestation of pregnancy: Secondary | ICD-10-CM | POA: Diagnosis not present

## 2020-09-03 DIAGNOSIS — O09212 Supervision of pregnancy with history of pre-term labor, second trimester: Secondary | ICD-10-CM | POA: Diagnosis not present

## 2020-09-03 DIAGNOSIS — O09522 Supervision of elderly multigravida, second trimester: Secondary | ICD-10-CM | POA: Diagnosis not present

## 2020-09-03 NOTE — Progress Notes (Signed)
Patient ID: Pam Vargas, female   DOB: January 07, 1981, 39 y.o.   MRN: [id number] Patient was assessed and managed by nursing staff during this encounter. I have reviewed the chart and agree with the documentation and plan. I have also made any necessary editorial changes.  Emeterio Reeve, MD 09/03/2020 11:07 AM

## 2020-09-03 NOTE — Progress Notes (Signed)
Patient ID: Pam Vargas, female   DOB: [birth year], 39 y.o.   MRN: [id number] Patient was assessed and managed by nursing staff during this encounter. I have reviewed the chart and agree with the documentation and plan. I have also made any necessary editorial changes.  Emeterio Reeve, MD 09/03/2020 11:12 AM

## 2020-09-04 DIAGNOSIS — O3432 Maternal care for cervical incompetence, second trimester: Secondary | ICD-10-CM | POA: Diagnosis not present

## 2020-09-04 DIAGNOSIS — O34219 Maternal care for unspecified type scar from previous cesarean delivery: Secondary | ICD-10-CM | POA: Diagnosis not present

## 2020-09-04 DIAGNOSIS — Z3A24 24 weeks gestation of pregnancy: Secondary | ICD-10-CM | POA: Diagnosis not present

## 2020-09-04 DIAGNOSIS — O09212 Supervision of pregnancy with history of pre-term labor, second trimester: Secondary | ICD-10-CM | POA: Diagnosis not present

## 2020-09-04 DIAGNOSIS — R000 Tachycardia, unspecified: Secondary | ICD-10-CM | POA: Diagnosis not present

## 2020-09-04 DIAGNOSIS — O09522 Supervision of elderly multigravida, second trimester: Secondary | ICD-10-CM | POA: Diagnosis not present

## 2020-09-04 DIAGNOSIS — O42912 Preterm premature rupture of membranes, unspecified as to length of time between rupture and onset of labor, second trimester: Secondary | ICD-10-CM | POA: Diagnosis not present

## 2020-09-04 DIAGNOSIS — O26892 Other specified pregnancy related conditions, second trimester: Secondary | ICD-10-CM | POA: Diagnosis not present

## 2020-09-06 DIAGNOSIS — Z3A26 26 weeks gestation of pregnancy: Secondary | ICD-10-CM | POA: Diagnosis not present

## 2020-09-06 DIAGNOSIS — O411210 Chorioamnionitis, first trimester, not applicable or unspecified: Secondary | ICD-10-CM | POA: Diagnosis not present

## 2020-09-06 DIAGNOSIS — O43892 Other placental disorders, second trimester: Secondary | ICD-10-CM | POA: Diagnosis not present

## 2020-09-06 DIAGNOSIS — O42112 Preterm premature rupture of membranes, onset of labor more than 24 hours following rupture, second trimester: Secondary | ICD-10-CM | POA: Diagnosis not present

## 2020-09-06 DIAGNOSIS — O368390 Maternal care for abnormalities of the fetal heart rate or rhythm, unspecified trimester, not applicable or unspecified: Secondary | ICD-10-CM | POA: Diagnosis not present

## 2020-09-06 DIAGNOSIS — O42919 Preterm premature rupture of membranes, unspecified as to length of time between rupture and onset of labor, unspecified trimester: Secondary | ICD-10-CM | POA: Diagnosis not present

## 2020-09-07 ENCOUNTER — Other Ambulatory Visit (HOSPITAL_COMMUNITY): Payer: Medicaid Other

## 2020-09-09 ENCOUNTER — Ambulatory Visit: Payer: Medicaid Other

## 2020-09-09 ENCOUNTER — Telehealth: Payer: Self-pay | Admitting: *Deleted

## 2020-09-09 NOTE — Telephone Encounter (Signed)
Message received from [facility] from [facility] MFM. She stated that she is calling to provide update on pt status who was transferred to them due to no NICU beds available in [place]. Pt delivered on 11/7 @ 26 wks, baby is in NICU. Pt was d/c home on 11/9 however is spending most days @ hospital in NICU with baby. She stated that pt needs to be scheduled for routine PP care @ 4-6 wks after delivery. Message will be sent to scheduling staff.

## 2020-09-16 ENCOUNTER — Other Ambulatory Visit: Payer: Medicaid Other

## 2020-09-16 ENCOUNTER — Encounter: Payer: Medicaid Other | Admitting: Obstetrics & Gynecology

## 2020-10-12 ENCOUNTER — Ambulatory Visit: Payer: Medicaid Other

## 2020-10-13 ENCOUNTER — Ambulatory Visit (INDEPENDENT_AMBULATORY_CARE_PROVIDER_SITE_OTHER): Payer: Medicaid Other | Admitting: Obstetrics and Gynecology

## 2020-10-13 ENCOUNTER — Encounter: Payer: Self-pay | Admitting: Obstetrics and Gynecology

## 2020-10-13 ENCOUNTER — Other Ambulatory Visit: Payer: Self-pay

## 2020-10-13 VITALS — BP 91/58 | HR 71 | Wt 140.1 lb

## 2020-10-13 DIAGNOSIS — N888 Other specified noninflammatory disorders of cervix uteri: Secondary | ICD-10-CM

## 2020-10-13 DIAGNOSIS — Z8751 Personal history of pre-term labor: Secondary | ICD-10-CM

## 2020-10-13 DIAGNOSIS — I471 Supraventricular tachycardia: Secondary | ICD-10-CM | POA: Diagnosis not present

## 2020-10-13 DIAGNOSIS — O9943 Diseases of the circulatory system complicating the puerperium: Secondary | ICD-10-CM

## 2020-10-13 DIAGNOSIS — Z8759 Personal history of other complications of pregnancy, childbirth and the puerperium: Secondary | ICD-10-CM

## 2020-10-13 DIAGNOSIS — F17200 Nicotine dependence, unspecified, uncomplicated: Secondary | ICD-10-CM

## 2020-10-13 NOTE — Patient Instructions (Addendum)
Please keep your appointment with Cardiology. Continue your prenatal vitamin daily, iron supplement every other day, and metoprolol as prescribed until follow-up with Cardiology. Please call your Family Medicine physician to schedule an appointment in the next 4-6 weeks or sooner as needed.  Postpartum Care After Vaginal Delivery This sheet gives you information about how to care for yourself from the time you deliver your baby to up to 6-12 weeks after delivery (postpartum period). Your health care provider may also give you more specific instructions. If you have problems or questions, contact your health care provider. Follow these instructions at home: Vaginal bleeding  It is normal to have vaginal bleeding (lochia) after delivery. Wear a sanitary pad for vaginal bleeding and discharge. ? During the first week after delivery, the amount and appearance of lochia is often similar to a menstrual period. ? Over the next few weeks, it will gradually decrease to a dry, yellow-brown discharge. ? For most women, lochia stops completely by 4-6 weeks after delivery. Vaginal bleeding can vary from woman to woman.  Change your sanitary pads frequently. Watch for any changes in your flow, such as: ? A sudden increase in volume. ? A change in color. ? Large blood clots.  If you pass a blood clot from your vagina, save it and call your health care provider to discuss. Do not flush blood clots down the toilet before talking with your health care provider.  Do not use tampons or douches until your health care provider says this is safe.  If you are not breastfeeding, your period should return 6-8 weeks after delivery. If you are feeding your child breast milk only (exclusive breastfeeding), your period may not return until you stop breastfeeding. Perineal care  Keep the area between the vagina and the anus (perineum) clean and dry as told by your health care provider. Use medicated pads and  pain-relieving sprays and creams as directed.  If you had a cut in the perineum (episiotomy) or a tear in the vagina, check the area for signs of infection until you are healed. Check for: ? More redness, swelling, or pain. ? Fluid or blood coming from the cut or tear. ? Warmth. ? Pus or a bad smell.  You may be given a squirt bottle to use instead of wiping to clean the perineum area after you go to the bathroom. As you start healing, you may use the squirt bottle before wiping yourself. Make sure to wipe gently.  To relieve pain caused by an episiotomy, a tear in the vagina, or swollen veins in the anus (hemorrhoids), try taking a warm sitz bath 2-3 times a day. A sitz bath is a warm water bath that is taken while you are sitting down. The water should only come up to your hips and should cover your buttocks. Breast care  Within the first few days after delivery, your breasts may feel heavy, full, and uncomfortable (breast engorgement). Milk may also leak from your breasts. Your health care provider can suggest ways to help relieve the discomfort. Breast engorgement should go away within a few days.  If you are breastfeeding: ? Wear a bra that supports your breasts and fits you well. ? Keep your nipples clean and dry. Apply creams and ointments as told by your health care provider. ? You may need to use breast pads to absorb milk that leaks from your breasts. ? You may have uterine contractions every time you breastfeed for up to several weeks after delivery.  Uterine contractions help your uterus return to its normal size. ? If you have any problems with breastfeeding, work with your health care provider or Science writer.  If you are not breastfeeding: ? Avoid touching your breasts a lot. Doing this can make your breasts produce more milk. ? Wear a good-fitting bra and use cold packs to help with swelling. ? Do not squeeze out (express) milk. This causes you to make more  milk. Intimacy and sexuality  Ask your health care provider when you can engage in sexual activity. This may depend on: ? Your risk of infection. ? How fast you are healing. ? Your comfort and desire to engage in sexual activity.  You are able to get pregnant after delivery, even if you have not had your period. If desired, talk with your health care provider about methods of birth control (contraception). Medicines  Take over-the-counter and prescription medicines only as told by your health care provider.  If you were prescribed an antibiotic medicine, take it as told by your health care provider. Do not stop taking the antibiotic even if you start to feel better. Activity  Gradually return to your normal activities as told by your health care provider. Ask your health care provider what activities are safe for you.  Rest as much as possible. Try to rest or take a nap while your baby is sleeping. Eating and drinking   Drink enough fluid to keep your urine pale yellow.  Eat high-fiber foods every day. These may help prevent or relieve constipation. High-fiber foods include: ? Whole grain cereals and breads. ? Brown rice. ? Beans. ? Fresh fruits and vegetables.  Do not try to lose weight quickly by cutting back on calories.  Take your prenatal vitamins until your postpartum checkup or until your health care provider tells you it is okay to stop. Lifestyle  Do not use any products that contain nicotine or tobacco, such as cigarettes and e-cigarettes. If you need help quitting, ask your health care provider.  Do not drink alcohol, especially if you are breastfeeding. General instructions  Keep all follow-up visits for you and your baby as told by your health care provider. Most women visit their health care provider for a postpartum checkup within the first 3-6 weeks after delivery. Contact a health care provider if:  You feel unable to cope with the changes that your child  brings to your life, and these feelings do not go away.  You feel unusually sad or worried.  Your breasts become red, painful, or hard.  You have a fever.  You have trouble holding urine or keeping urine from leaking.  You have little or no interest in activities you used to enjoy.  You have not breastfed at all and you have not had a menstrual period for 12 weeks after delivery.  You have stopped breastfeeding and you have not had a menstrual period for 12 weeks after you stopped breastfeeding.  You have questions about caring for yourself or your baby.  You pass a blood clot from your vagina. Get help right away if:  You have chest pain.  You have difficulty breathing.  You have sudden, severe leg pain.  You have severe pain or cramping in your lower abdomen.  You bleed from your vagina so much that you fill more than one sanitary pad in one hour. Bleeding should not be heavier than your heaviest period.  You develop a severe headache.  You faint.  You have  blurred vision or spots in your vision.  You have bad-smelling vaginal discharge.  You have thoughts about hurting yourself or your baby. If you ever feel like you may hurt yourself or others, or have thoughts about taking your own life, get help right away. You can go to the nearest emergency department or call:  Your local emergency services (911 in the [place]  A suicide crisis helpline, such as the [place] at 818-606-9520. This is open 24 hours a day. Summary  The period of time right after you deliver your newborn up to 6-12 weeks after delivery is called the postpartum period.  Gradually return to your normal activities as told by your health care provider.  Keep all follow-up visits for you and your baby as told by your health care provider. This information is not intended to replace advice given to you by your health care provider. Make sure you discuss any questions you  have with your health care provider. Document Revised: 10/20/2017 Document Reviewed: 07/31/2017 Elsevier Patient Education  2020 [employer].

## 2020-10-13 NOTE — Progress Notes (Signed)
[place] Partum Visit Note  Pam Vargas is a 39 y.o. [phone] female who presents for a postpartum visit. She is 5 weeks 2 days postpartum following a vaginal delivery. Pt was admitted to [facility] for PPROM at [redacted]w[redacted]d, at which time her cerclage was in place. The delivery was at [redacted]w[redacted]d gestational weeks, complicated by retained placenta s/p manual extraction.  Anesthesia: epidural. Postpartum course has been uncomplicated. Baby is in the NICU given preterm delivery. Baby is feeding by breast; pt pumping every 2 hours. Bleeding red; changing a pad every 3 hours. Bowel function is normal. Bladder function is normal. Patient is not sexually active. Contraception method is condoms. Postpartum depression screening: negative. Pt presents with husband.  Given maternal tachycardia to 130s and subsequent finding of SVT on HD#2 at [facility], cardiology was consulted and started pt on metoprolol 12.5mg  BID. SVT resolved s/p initiation of metoprolol per discharge summary. Pt reports occasional "racing of heart s/p delivery." She has an appointment with cardiologist in [place] tomorrow (10/14/20) to have an echo. Pt taking metoprolol 12.5mg  twice daily (per cardiology at [facility]) with good adherence in addition to iron supplement as instructed at time of discharge. Normal pap on 09/20/2019.  The pregnancy intention screening data noted above was reviewed. Potential methods of contraception were discussed. The patient elected to proceed with Female Condom.  The following portions of the patient's history were reviewed and updated as appropriate: allergies, current medications, past family history, past medical history, past social history, past surgical history and problem list.  Review of Systems Pertinent items noted in HPI and remainder of comprehensive ROS otherwise negative.    Objective:  LMP 03/07/2020 (Exact Date)    General:  alert, cooperative and appears stated age   Breasts:  Not  inspected  Lungs: normal WOB  Heart:  regular rate  Abdomen: soft, non-tender, non-distended   Vulva:  normal except for single non-erythematous pustule along left vulva  Vagina: normal vagina, no discharge, exudate, lesion, or erythema  Cervix:  1cm x1cm cystic structure along superior right aspect of cervix, consistent with Nabothian cyst  Corpus: not examined  Adnexa:  not evaluated  Rectal Exam: deferreed        Assessment:    Normal postpartum exam. Pap smear not done at today's visit given up-to-date.  Plan:   Essential components of care per ACOG recommendations:  1.  Mood and well being: Patient with negative depression screening today. Reviewed local resources for support.  - Patient does use tobacco. We discussed reduction and for recently cessation risk of relapse. Using 5 cigarettes daily. Counseled on benefits/risks of cessation vs continued use respectively. Pt reports desire to cut back but declines [facility] consult and pharmacologic options for cessation support at this time. - hx of drug use? Denies  2. Infant care and feeding:  -Patient currently breastmilk feeding? Yes Pt not planning to return to work at this time. Reviewed importance of draining breast regularly to support lactation. -Social determinants of health (SDOH) reviewed in EPIC.  3. Sexuality, contraception and birth spacing - Patient does not want a pregnancy in the next year.  Desired family size is unknown.  - Reviewed forms of contraception in tiered fashion. Patient desired condoms today s/p counseling on birth control options including LARC. - Discussed birth spacing of 18 months  4. Sleep and fatigue -Encouraged family/partner/community support of 4 hrs of uninterrupted sleep to help with mood and fatigue  5. Physical Recovery  -  Discussed patients delivery and complications - Patient had no lacerations, perineal healing reviewed. Patient expressed understanding - Patient has urinary incontinence?  No  - Patient is safe to resume physical and sexual activity - Provided reassurance regarding Nabothian cyst and small sebaceous cyst  6.  Health Maintenance - Last pap smear done 09/20/19 and was normal with negative HPV.  7. Chronic Disease - Supraventricular Tachycardia: diagnosed intrapartum at [facility]. Pt taking metoprolol 12.5mg  BID with good adherence per Cardiology with nearly complete resolution of symptoms. Pt today endorses an "occasional fluttering". Pt with low normal BP today. No signs/symptoms of hypovolemia or orthostatic hypotension. - plan for Cardiology f/u on 12/15 as previously scheduled - recommended continued metoprolol 12.5mg  BID (as prescribed by Cardiology on discharge from [facility]) - provided strict return precautions to PCP or Cardiology for dizziness, lightheadedness, chest pain, dyspnea or other concerns  Fran Mcree, Gildardo Cranker, MD OB Fellow, Faculty Practice 10/14/2020 3:17 PM

## 2020-10-14 ENCOUNTER — Other Ambulatory Visit: Payer: Self-pay

## 2020-10-14 ENCOUNTER — Ambulatory Visit (HOSPITAL_COMMUNITY): Payer: Medicaid Other | Attending: Internal Medicine

## 2020-10-14 ENCOUNTER — Encounter: Payer: Self-pay | Admitting: Obstetrics and Gynecology

## 2020-10-14 DIAGNOSIS — R002 Palpitations: Secondary | ICD-10-CM

## 2020-10-14 LAB — ECHOCARDIOGRAM COMPLETE
Area-P 1/2: 2.56 cm2
S' Lateral: 3.9 cm

## 2020-10-15 ENCOUNTER — Encounter: Payer: Self-pay | Admitting: *Deleted

## 2020-11-02 ENCOUNTER — Ambulatory Visit (INDEPENDENT_AMBULATORY_CARE_PROVIDER_SITE_OTHER): Payer: Medicaid Other | Admitting: Internal Medicine

## 2020-11-02 ENCOUNTER — Ambulatory Visit: Payer: Medicaid Other | Admitting: Family Medicine

## 2020-11-02 ENCOUNTER — Encounter: Payer: Self-pay | Admitting: Internal Medicine

## 2020-11-02 ENCOUNTER — Other Ambulatory Visit: Payer: Self-pay

## 2020-11-02 VITALS — BP 120/72 | HR 71 | Ht 64.0 in | Wt 139.6 lb

## 2020-11-02 DIAGNOSIS — I471 Supraventricular tachycardia: Secondary | ICD-10-CM

## 2020-11-02 MED ORDER — METOPROLOL TARTRATE 25 MG PO TABS: 12.5000 mg | ORAL_TABLET | Freq: Two times a day (BID) | ORAL | 3 refills | Status: DC

## 2020-11-02 NOTE — Patient Instructions (Signed)
Medication Instructions:  Your physician recommends that you continue on your current medications as directed. Please refer to the Current Medication list given to you today. *If you need a refill on your cardiac medications before your next appointment, please call your pharmacy*  Lab Work: None ordered. If you have labs (blood work) drawn today and your tests are completely normal, you will receive your results only by: Marland Kitchen MyChart Message (if you have MyChart) OR . A paper copy in the mail If you have any lab test that is abnormal or we need to change your treatment, we will call you to review the results.  Testing/Procedures: None ordered.  Follow-Up: At [facility], you and your health needs are our priority.  As part of our continuing mission to provide you with exceptional heart care, we have created designated Provider Care Teams.  These Care Teams include your primary Cardiologist (physician) and Advanced Practice Providers (APPs -  Physician Assistants and Nurse Practitioners) who all work together to provide you with the care you need, when you need it.  Your next appointment:   6 month(s)  The format for your next appointment:   In Person  Provider:   [name], MD

## 2020-11-02 NOTE — Progress Notes (Signed)
Cardiology Office Note:    Date:  11/02/2020   ID:  Pam Vargas, DOB 1981/04/30, MRN [id number]  PCP:  Danna Hefty, DO   Referring MD: Danna Hefty, DO   CC: SVT Follow up  History of Present Illness:    Pam Vargas is a 40 y.o. female with a hx of sinus tachycardia in the setting of pregnancy (seen 08/25/20 while [redacted] weeks pregnant)   Interval history notable for Delivery at [facility], 10/14/20, with SVT on HD#2 at [facility], cardiology was consulted and started pt on metoprolol 12.5mg  BID.   Patient notes that she is doing well.  Since day prior/last visit notes new SVT changes with delivery of her baby who is still in NICU.     No chest pain or pressure.  No SOB/DOE and no PND/Orthopnea.  No weight gain or leg swelling.  No palpitations or syncope.  Past Medical History:  Diagnosis Date  . Abnormal uterine bleeding (AUB) 03/23/2020  . Advanced maternal age in multigravida   . Alpha thalassemia silent carrier   . Anemia   . Blood transfusion without reported diagnosis    "I think so with my first delivery"  . Cervical cerclage suture present 06/19/2020   06/19/20: ppx mcdonald cerclage with mersilene tape. Knot anteriorly.   . Cervical mass 04/2020  . GBS (group B Streptococcus carrier), +RV culture, currently pregnant 08/31/2020  . Medical history non-contributory    cerclage placed August 20,2021-recieving [name] injections weekly  . Palpitations   . Preterm labor   . PROM (premature rupture of membranes) 08/28/2020  . Recurrent upper respiratory infection (URI)   . Sinus tachycardia   . UTI (urinary tract infection)     Past Surgical History:  Procedure Laterality Date  . CERVICAL CERCLAGE N/A 06/19/2020   Procedure: CERCLAGE CERVICAL;  Surgeon: Aletha Halim, MD;  Location: [facility] LD ORS;  Service: Gynecology;  Laterality: N/A;  . CESAREAN SECTION    . FOOT SURGERY    . FOOT SURGERY     Current Medications: No outpatient medications have been marked  as taking for the 11/02/20 encounter (Office Visit) with Werner Lean, MD.     Allergies:   Patient has no known allergies.   Social History   Socioeconomic History  . Marital status: Married    Spouse name: Not on file  . Number of children: Not on file  . Years of education: Not on file  . Highest education level: Not on file  Occupational History  . Not on file  Tobacco Use  . Smoking status: Former Smoker    Packs/day: 0.30    Types: Cigarettes    Quit date: 05/29/2020    Years since quitting: 0.4  . Smokeless tobacco: Never Used  Vaping Use  . Vaping Use: Never used  Substance and Sexual Activity  . Alcohol use: Not Currently    Comment: last drink 04/21/2018  . Drug use: No  . Sexual activity: Not Currently    Partners: Male    Birth control/protection: None  Other Topics Concern  . Not on file  Social History Narrative   Works at [employer] as a Chartered certified accountant    Social Determinants of Radio broadcast assistant Strain: Not on [employer] Insecurity: No [employer]  . Worried About Charity fundraiser in the Last Year: Never true  . Ran Out of Food in the Last Year: Never true  Transportation Needs: No  Transportation Needs  . Lack of Transportation (Medical): No  . Lack of Transportation (Non-Medical): No  Physical Activity: Not on file  Stress: Not on file  Social Connections: Not on file    Boy's names are Tyreek (22), Oshi (16) and Sincere; who is still in the NICU.  Family History: The patient's family history includes Breast cancer in her maternal aunt; Diabetes in her mother; Heart disease in her father; Hyperlipidemia in her mother; Hypertension in her father and mother. Father and sister had hole in the heart (and have been closed). Father had pacemaker and now has defibrillator. Patient unsure indications for above. No SCD, drowning's or car accidents. No history of ARVC  ROS:   Please see the history of present illness.    All  other systems reviewed and are negative.  EKGs/Labs/Other Studies Reviewed:    The following studies were reviewed today:  EKG:   11/03/19: Sinus Rhythm with 1st degree heart block with atypical P wave morphology but no evidence of A Fl. 08/07/20 sinus tach 107 with anterior TWI and borderline LAE  Transthoracic Echocardiogram: Date: 10/14/20 Results: No not see significant inferior hypokinesis on two chamber, 1. Inferior basal hypokinesis . Left ventricular ejection fraction, by  estimation, is 55%. The left ventricle has normal function. The left  ventricle demonstrates regional wall motion abnormalities (see scoring  diagram/findings for description). Left  ventricular diastolic parameters were normal.  2. Right ventricular systolic function is normal. The right ventricular  size is normal.  3. The mitral valve is normal in structure. Mild mitral valve  regurgitation. No evidence of mitral stenosis.  4. The aortic valve is tricuspid. Aortic valve regurgitation is not  visualized. No aortic stenosis is present.  5. The inferior vena cava is normal in size with greater than 50%  respiratory variability, suggesting right atrial pressure of 3 mmHg.    Recent Labs: 03/23/2020: TSH 0.522 08/07/2020: ALT 13; BUN 9; Creatinine, Ser 0.65; Potassium 3.8; Sodium 138 08/28/2020: Hemoglobin 12.0; Platelets 230  Recent Lipid Panel    Component Value Date/Time   CHOL 124 10/10/2007 2120   TRIG 36 10/10/2007 2120   HDL 57 10/10/2007 2120   CHOLHDL 2.2 Ratio 10/10/2007 2120   VLDL 7 10/10/2007 2120   LDLCALC 60 10/10/2007 2120    Physical Exam:    VS:  BP 120/72   Pulse 71   Ht 5\' 4"  (1.626 m)   Wt 139 lb 9.6 oz (63.3 kg)   SpO2 99%   BMI 23.96 kg/m     Wt Readings from Last 3 Encounters:  11/02/20 139 lb 9.6 oz (63.3 kg)  10/13/20 140 lb 1.6 oz (63.5 kg)  08/28/20 141 lb (64 kg)    GEN:  Well nourished, well developed in no acute distress HEENT: Normal NECK: No JVD; No  carotid bruits LYMPHATICS: No lymphadenopathy CARDIAC: RRR, no murmurs, rubs, gallops RESPIRATORY:  Clear to auscultation without rales, wheezing or rhonchi  ABDOMEN: Soft, non-tender, non-distended MUSCULOSKELETAL:  No edema; No deformity  SKIN: Warm and dry NEUROLOGIC:  Alert and oriented x 3 PSYCHIATRIC:  Normal affect   ASSESSMENT:    1. SVT (supraventricular tachycardia) (HCC)    PLAN:    In order of problems listed above:  SVT - AV Nodal Therapy: 12.5 mg PO BID is tolerate well with good control and no adverse sx; continue - discussed strategies to come off medication if patient desires or plans if needing AAD therapy  6 month follow up unless  new symptoms or abnormal test results warranting change in plan  Would be reasonable for  Virtual Follow up  Would be reasonable for  APP Follow up    Medication Adjustments/Labs and Tests Ordered: Current medicines are reviewed at length with the patient today.  Concerns regarding medicines are outlined above.  Orders Placed This Encounter  Procedures  . EKG 12-Lead   Meds ordered this encounter  Medications  . metoprolol tartrate (LOPRESSOR) 25 MG tablet    Sig: Take 0.5 tablets (12.5 mg total) by mouth 2 (two) times daily.    Dispense:  90 tablet    Refill:  3    Patient Instructions  Medication Instructions:  Your physician recommends that you continue on your current medications as directed. Please refer to the Current Medication list given to you today. *If you need a refill on your cardiac medications before your next appointment, please call your pharmacy*  Lab Work: None ordered. If you have labs (blood work) drawn today and your tests are completely normal, you will receive your results only by: Marland Kitchen MyChart Message (if you have MyChart) OR . A paper copy in the mail If you have any lab test that is abnormal or we need to change your treatment, we will call you to review the  results.  Testing/Procedures: None ordered.  Follow-Up: At [facility], you and your health needs are our priority.  As part of our continuing mission to provide you with exceptional heart care, we have created designated Provider Care Teams.  These Care Teams include your primary Cardiologist (physician) and Advanced Practice Providers (APPs -  Physician Assistants and Nurse Practitioners) who all work together to provide you with the care you need, when you need it.  Your next appointment:   6 month(s)  The format for your next appointment:   In Person  Provider:   Rudean Haskell, MD     Signed, Werner Lean, MD  11/02/2020 8:41 AM    [place]

## 2020-11-10 DIAGNOSIS — Z1231 Encounter for screening mammogram for malignant neoplasm of breast: Secondary | ICD-10-CM

## 2020-11-12 ENCOUNTER — Ambulatory Visit: Payer: Medicaid Other | Admitting: Family Medicine

## 2020-11-13 ENCOUNTER — Other Ambulatory Visit: Payer: Self-pay | Admitting: Family Medicine

## 2020-11-13 DIAGNOSIS — Z1231 Encounter for screening mammogram for malignant neoplasm of breast: Secondary | ICD-10-CM

## 2020-11-21 IMAGING — US US OB < 14 WEEKS - US OB TV
1 series · 15 of 28 positions shown · non-contrast
Comparison: None for this gestation; 04/26/2018

CLINICAL DATA: At positive pregnancy test, incidental pregnancy,
LMP 03/07/2020

EXAM:
OBSTETRIC <14 WK US AND TRANSVAGINAL OB US
TECHNIQUE: Both transabdominal and transvaginal ultrasound examinations were
performed for complete evaluation of the gestation as well as the
maternal uterus, adnexal regions, and pelvic cul-de-sac.
Transvaginal technique was performed to assess early pregnancy.

[Series 1: us ob < 14 weeks - us ob tv · 15 of 83 slices shown]
[im 1/83]
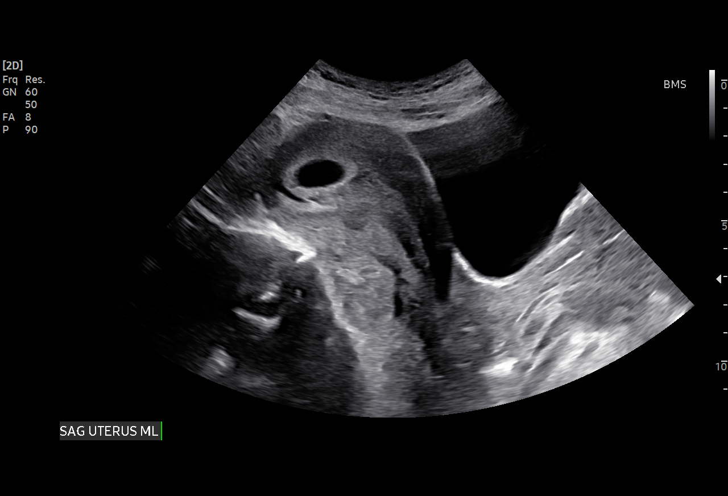
[im 7/83]
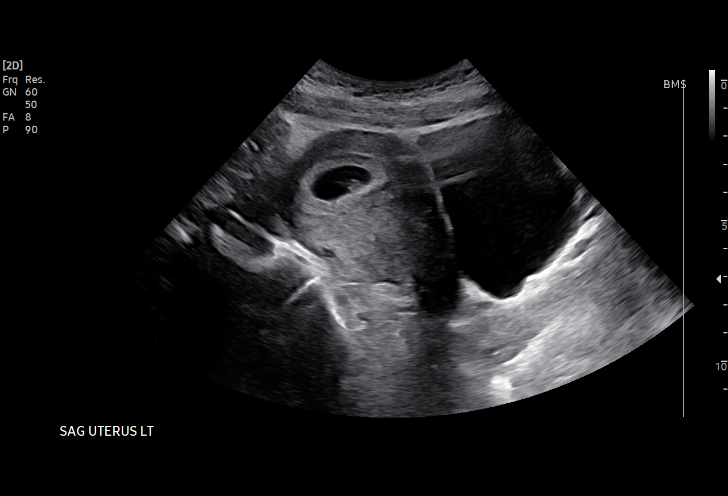
[im 13/83]
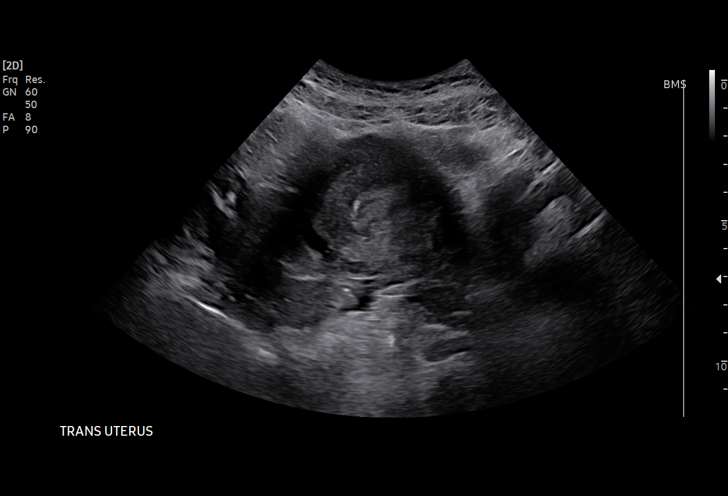
[im 19/83]
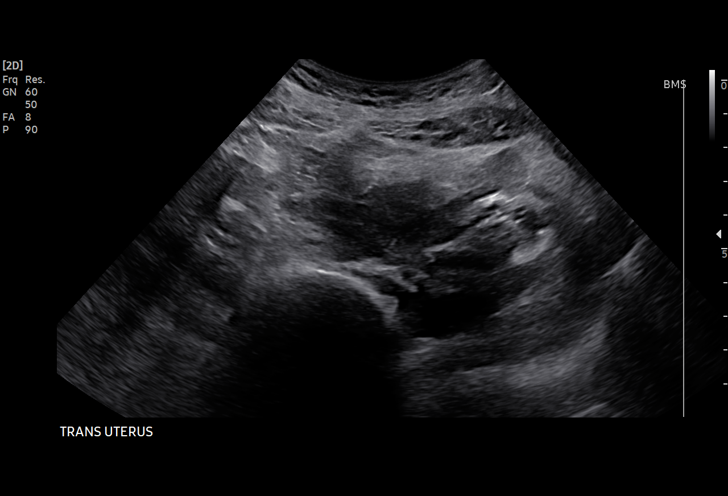
[im 25/83]
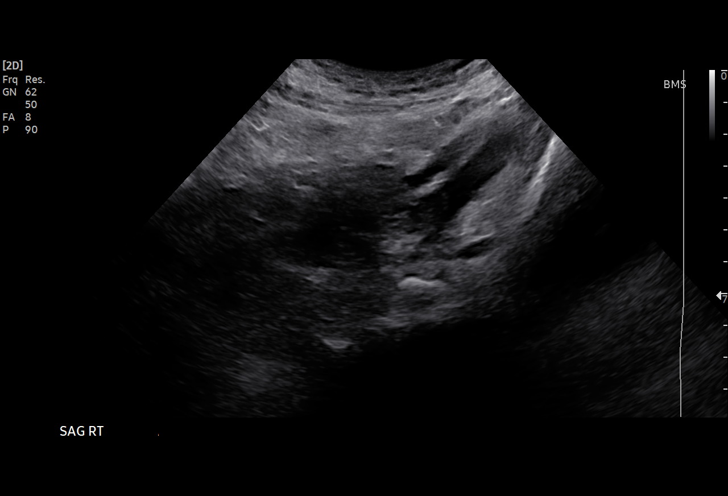
[im 31/83]
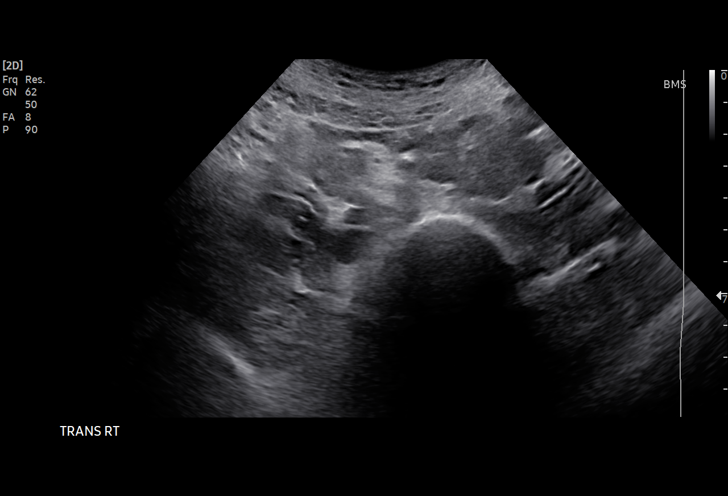
[im 37/83]
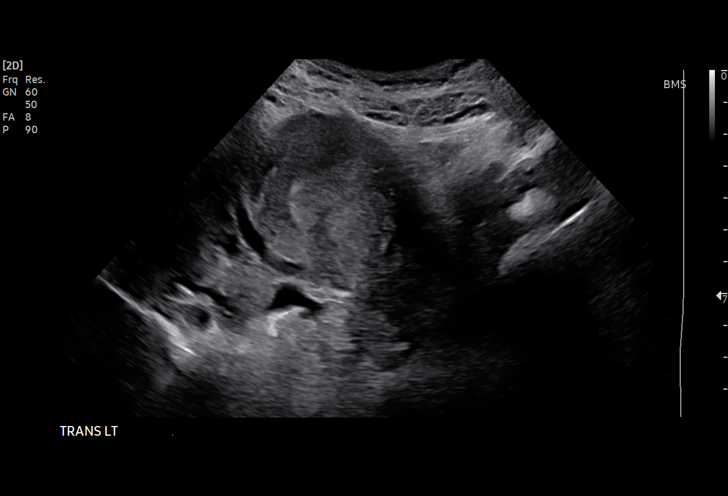
[im 43/83]
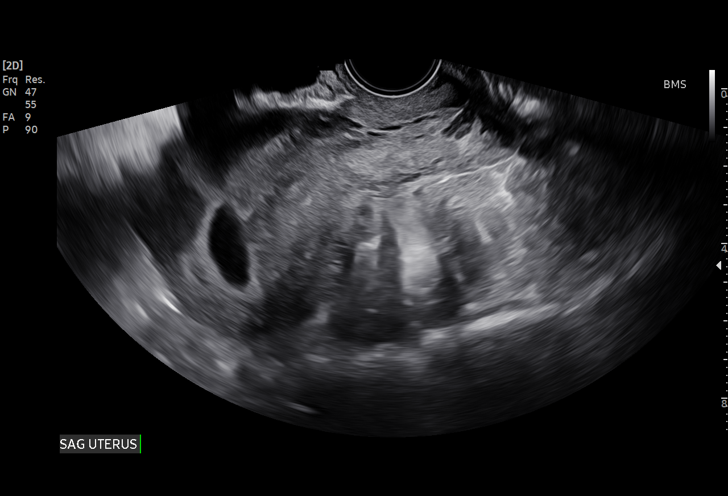
[im 46/83]
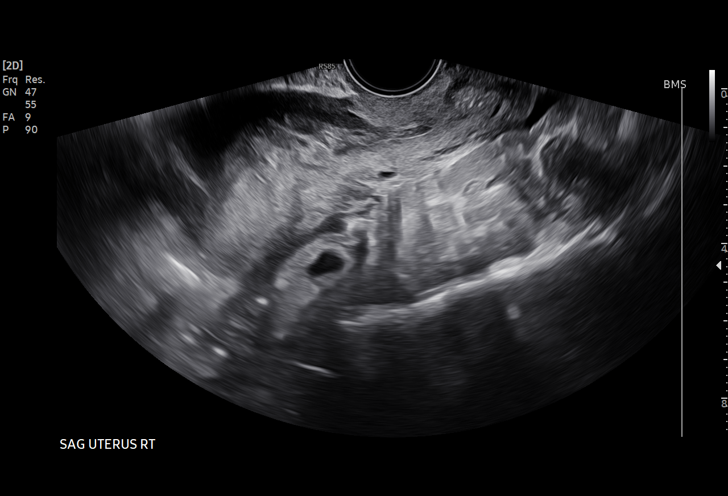
[im 52/83]
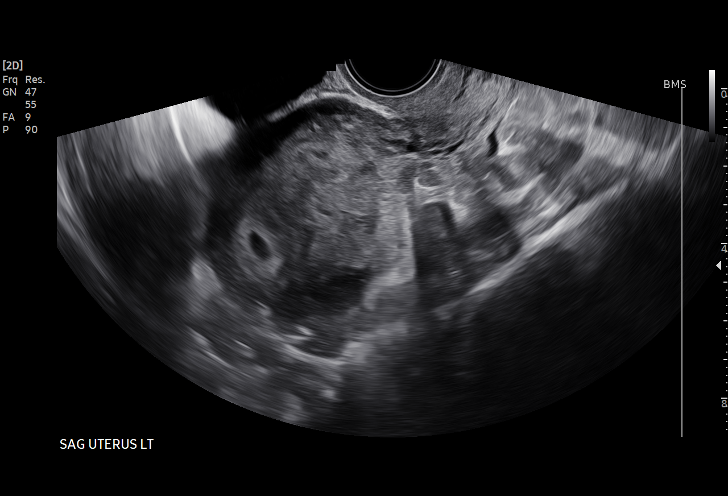
[im 58/83]
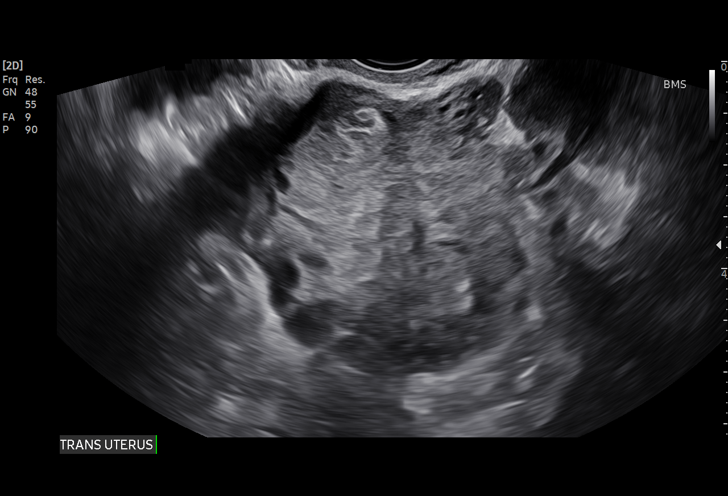
[im 64/83]
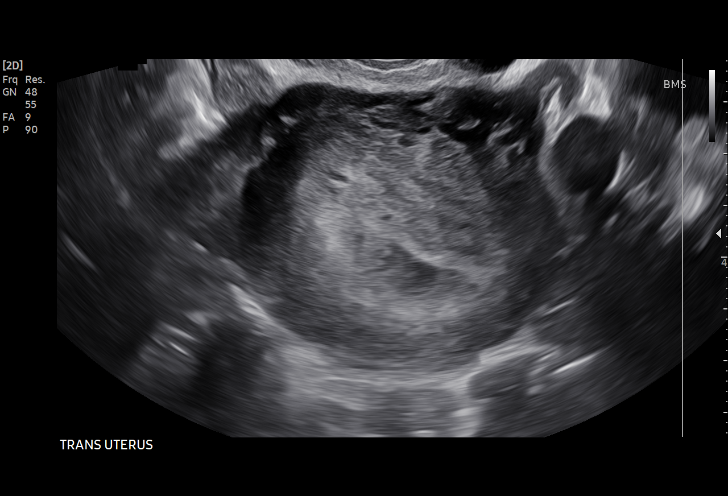
[im 70/83]
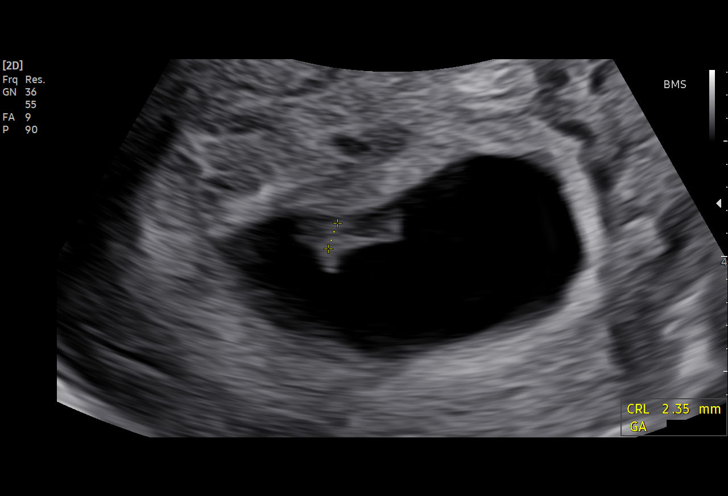
[im 76/83]
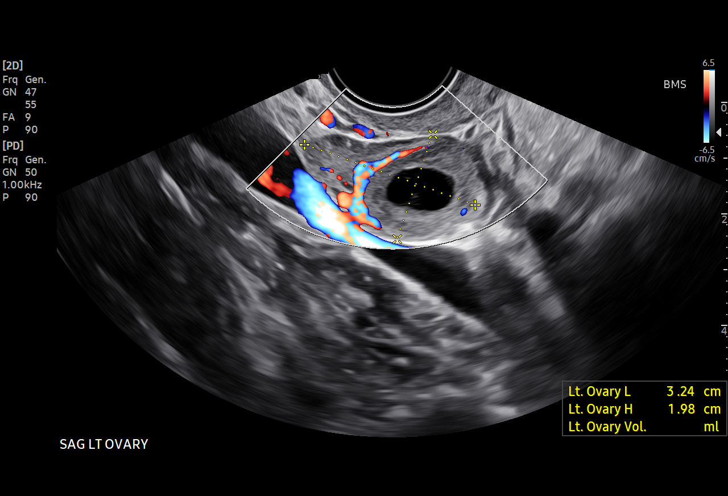
[im 83/83]
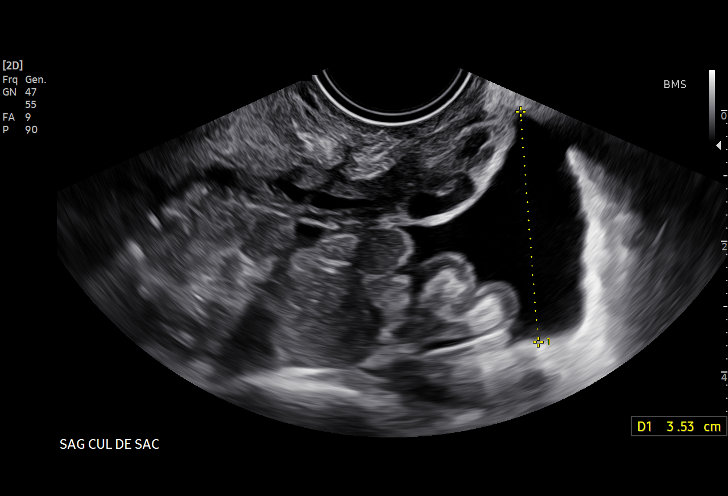

[15 of 28 positions shown; findings below may reference images not displayed]

FINDINGS: Intrauterine gestational sac: Present, single

Yolk sac:  Present

Embryo:  Present

Cardiac Activity: Present

Heart Rate: 117 bpm

CRL:  2.6 mm   5 w   6 d                  US EDC: 12/15/2020

Subchorionic hemorrhage:  None visualized.

Maternal uterus/adnexae:

Maternal uterus otherwise unremarkable.

Hyperechoic focus within the body of the cervix 18 x 12 x 15 mm,
without internal blood flow on color Doppler imaging, nonspecific;
this could represent a complicated Nabothian cyst or a cervical
mass/tumor.

This was not definitely identified on the prior exam.

RIGHT ovary normal size and morphology, 2.6 x 1.6 x 2.0 cm.

LEFT ovary measures 3.2 x 2.0 x 2.6 cm and contains a corpus luteum.

Moderate amount of free pelvic fluid.

No adnexal masses.
IMPRESSION: Single live intrauterine gestation at 5 weeks 6 days EGA by
crown-rump length.

Hyperechoic mass within body of cervix, 18 mm greatest size question
complicated Nabothian cyst or a cervical mass/tumor; recommend
characterization by MRI (noncontrast in the setting of early
pregnancy).

These results will be called to the ordering clinician or
representative by the Radiologist Assistant, and communication
documented in the PACS or [REDACTED].

## 2020-11-25 ENCOUNTER — Ambulatory Visit: Payer: Medicaid Other | Admitting: Family Medicine

## 2020-12-02 ENCOUNTER — Other Ambulatory Visit: Payer: Self-pay | Admitting: Family Medicine

## 2020-12-02 ENCOUNTER — Other Ambulatory Visit: Payer: Self-pay

## 2020-12-02 ENCOUNTER — Telehealth: Payer: Self-pay

## 2020-12-02 ENCOUNTER — Ambulatory Visit
Admission: RE | Admit: 2020-12-02 | Discharge: 2020-12-02 | Disposition: A | Discharge status: Home / Self Care | Payer: Medicaid Other | Source: Ambulatory Visit

## 2020-12-02 DIAGNOSIS — Z1231 Encounter for screening mammogram for malignant neoplasm of breast: Secondary | ICD-10-CM

## 2020-12-02 DIAGNOSIS — R928 Other abnormal and inconclusive findings on diagnostic imaging of breast: Secondary | ICD-10-CM

## 2020-12-02 NOTE — Telephone Encounter (Signed)
Pt called requested Dr Tarry Kos call her back to go over her mammogram results [phone]

## 2020-12-02 NOTE — Telephone Encounter (Signed)
Spoke to patient and discussed her mammogram results. Discussed follow up recommendations. Answered all questions. Orders placed and message sent to team to schedule.

## 2020-12-03 ENCOUNTER — Other Ambulatory Visit: Payer: Self-pay | Admitting: Family Medicine

## 2020-12-03 DIAGNOSIS — R928 Other abnormal and inconclusive findings on diagnostic imaging of breast: Secondary | ICD-10-CM

## 2020-12-03 NOTE — Telephone Encounter (Signed)
-----   Message from Danna Hefty, [place] sent at 12/02/2020  7:59 PM EST ----- Regarding: Schedule Diagnostic mammogram & [place] Please schedule patient for diagnostic mammogram with ultrasound for abnormal bilateral mammogram. Please schedule at earliest convenience. I have informed patient of results. She is aware that we will be scheduling this.

## 2020-12-03 NOTE — Telephone Encounter (Signed)
Patient informed of appointment for diagnostic mammogram and ultrasound.  12/17/2020 1410 with arrival time at [facility] [phone]  Patient verbalized understanding.  Ozella Almond, [place]

## 2020-12-17 ENCOUNTER — Other Ambulatory Visit: Payer: Self-pay | Admitting: Family Medicine

## 2020-12-17 ENCOUNTER — Other Ambulatory Visit: Payer: Self-pay

## 2020-12-17 ENCOUNTER — Ambulatory Visit: Payer: Medicaid Other

## 2020-12-17 ENCOUNTER — Ambulatory Visit
Admission: RE | Admit: 2020-12-17 | Discharge: 2020-12-17 | Disposition: A | Discharge status: Home / Self Care | Payer: Medicaid Other | Source: Ambulatory Visit | Attending: Family Medicine | Admitting: Family Medicine

## 2020-12-17 DIAGNOSIS — R928 Other abnormal and inconclusive findings on diagnostic imaging of breast: Secondary | ICD-10-CM

## 2020-12-17 DIAGNOSIS — R922 Inconclusive mammogram: Secondary | ICD-10-CM | POA: Diagnosis not present

## 2020-12-17 DIAGNOSIS — N6002 Solitary cyst of left breast: Secondary | ICD-10-CM | POA: Diagnosis not present

## 2020-12-18 ENCOUNTER — Ambulatory Visit
Admission: RE | Admit: 2020-12-18 | Discharge: 2020-12-18 | Disposition: A | Discharge status: Home / Self Care | Payer: Medicaid Other | Source: Ambulatory Visit | Attending: Family Medicine | Admitting: Family Medicine

## 2020-12-18 ENCOUNTER — Ambulatory Visit
Admission: RE | Admit: 2020-12-18 | Discharge: 2020-12-18 | Disposition: A | Discharge status: Home / Self Care | Payer: Medicaid Other | Source: Ambulatory Visit | Attending: Internal Medicine | Admitting: Internal Medicine

## 2020-12-18 ENCOUNTER — Other Ambulatory Visit: Payer: Self-pay | Admitting: Internal Medicine

## 2020-12-18 ENCOUNTER — Other Ambulatory Visit: Payer: Self-pay | Admitting: Family Medicine

## 2020-12-18 DIAGNOSIS — R928 Other abnormal and inconclusive findings on diagnostic imaging of breast: Secondary | ICD-10-CM

## 2020-12-18 DIAGNOSIS — N6002 Solitary cyst of left breast: Secondary | ICD-10-CM | POA: Diagnosis not present

## 2020-12-18 DIAGNOSIS — R000 Tachycardia, unspecified: Secondary | ICD-10-CM

## 2020-12-18 DIAGNOSIS — R002 Palpitations: Secondary | ICD-10-CM

## 2020-12-21 ENCOUNTER — Encounter: Payer: Self-pay | Admitting: Family Medicine

## 2021-02-13 IMAGING — US US MFM OB TRANSVAGINAL
1 series · 15 of 21 positions shown · non-contrast
Comparison: none

[Series 1: us mfm ob transvaginal · 21 acquisitions, 15 frames shown]
[im 1/21]
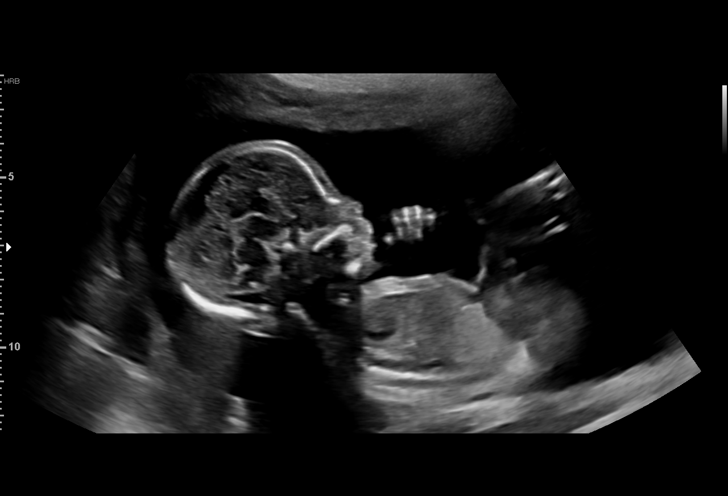
[im 3/21]
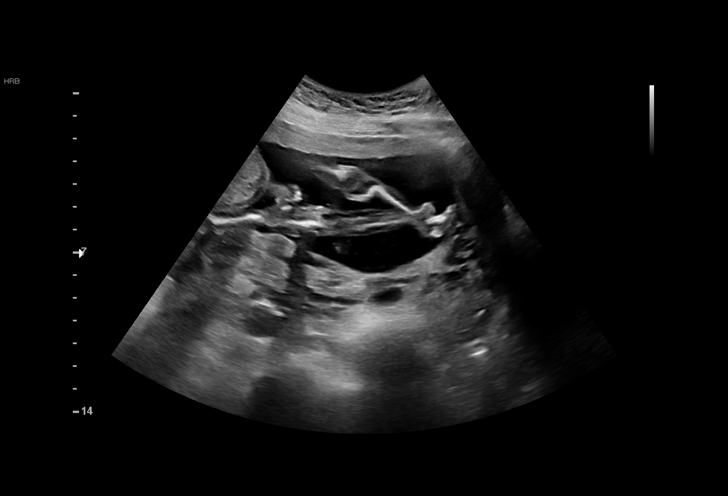
[im 4/21]
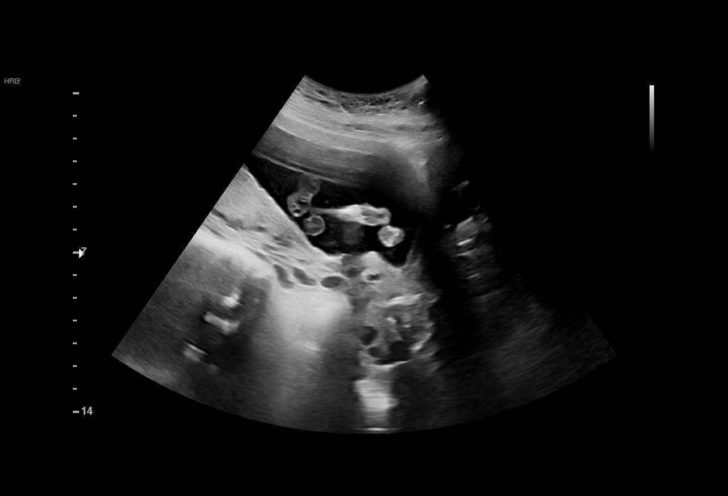
[im 5/21]
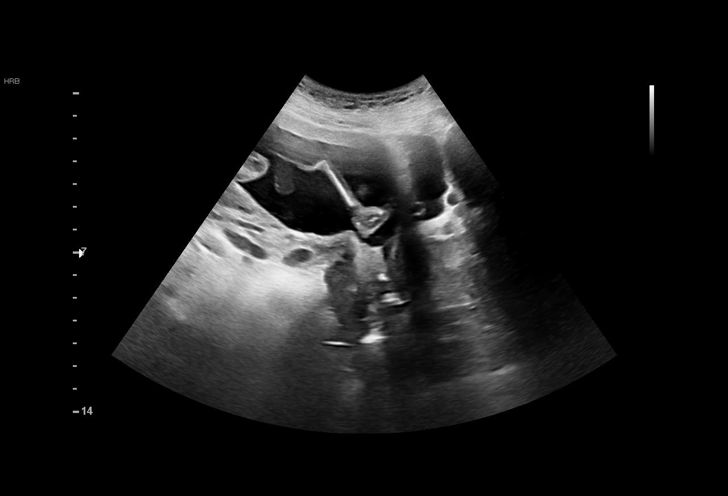
[im 7/21]
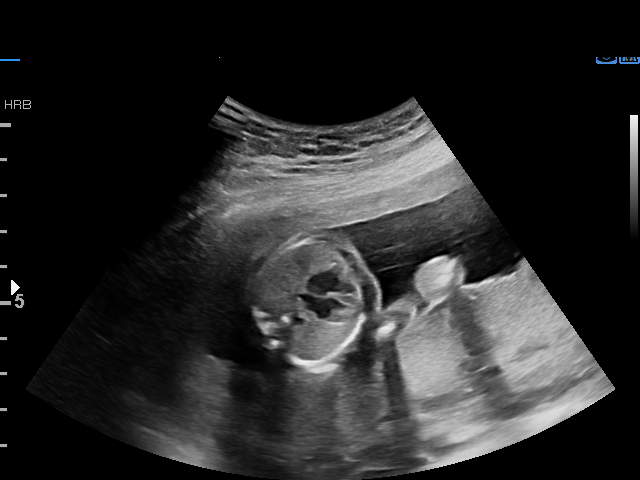
[im 8/21]
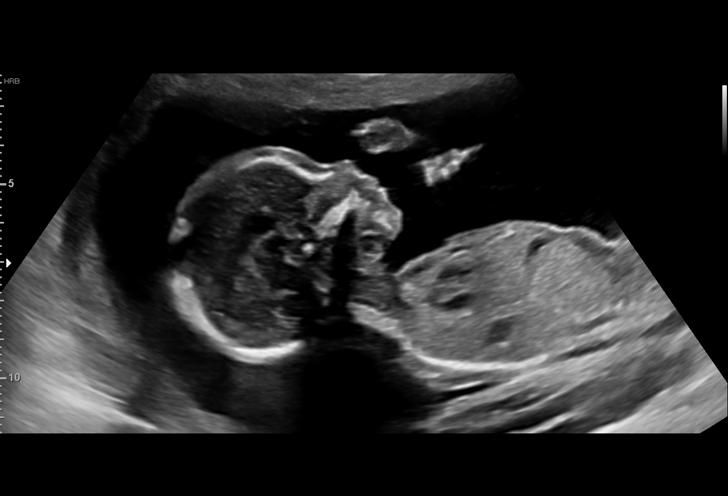
[im 10/21]
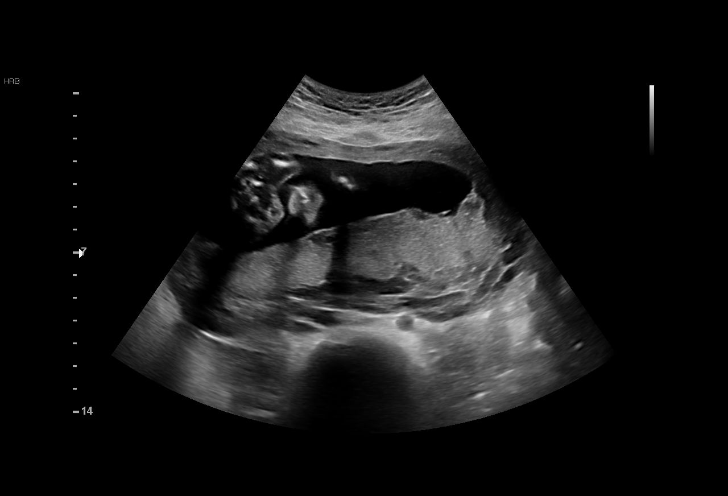
[im 11/21]
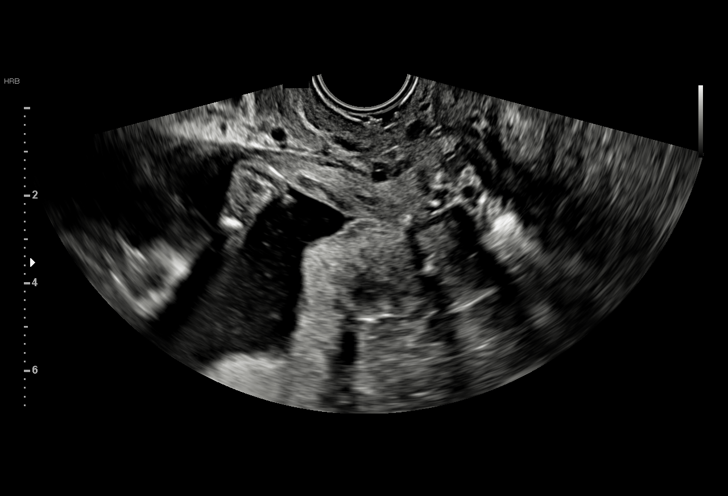
[im 12/21]
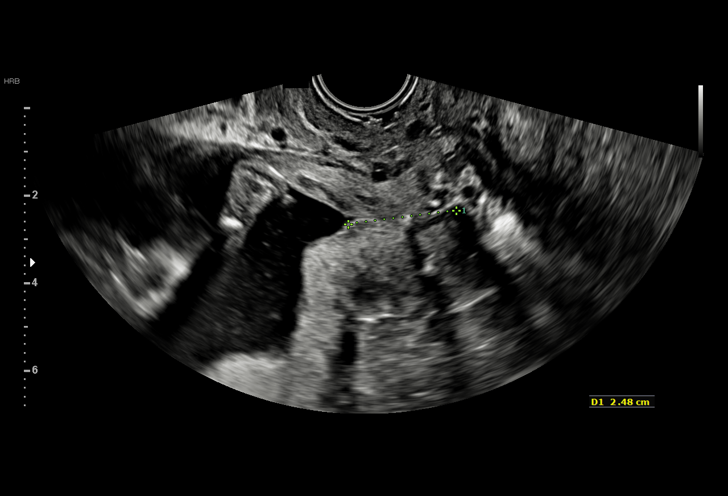
[im 14/21]
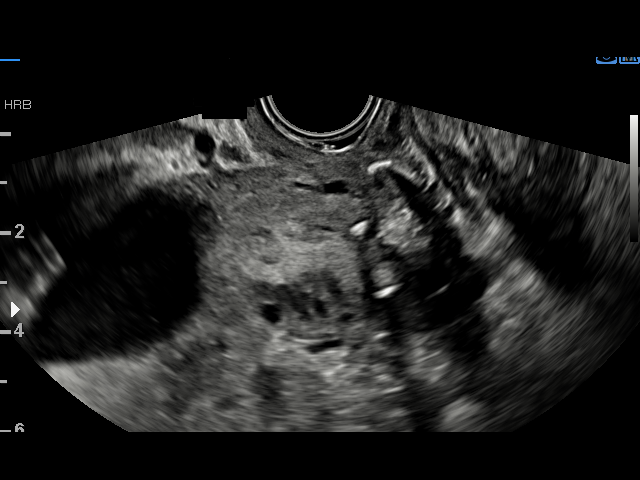
[im 15/21]
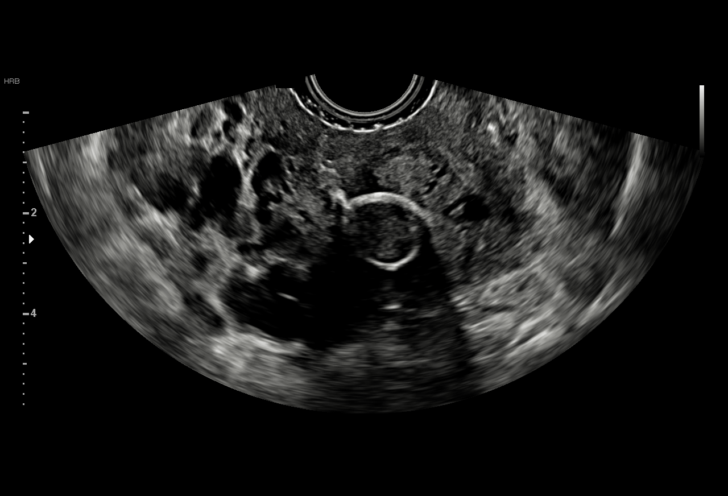
[im 17/21]
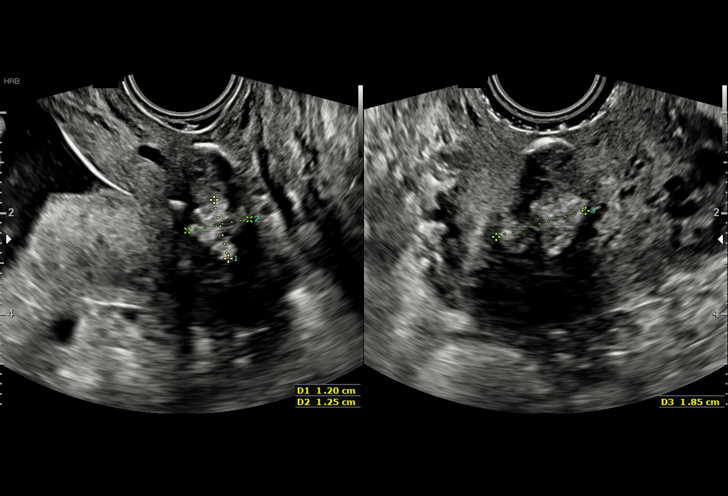
[im 18/21]
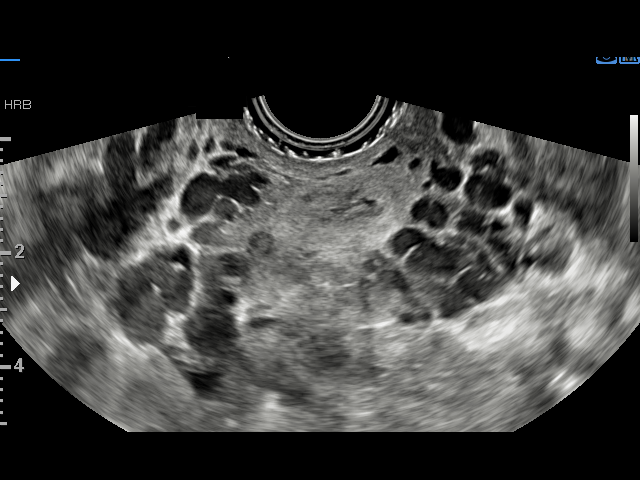
[im 19/21]
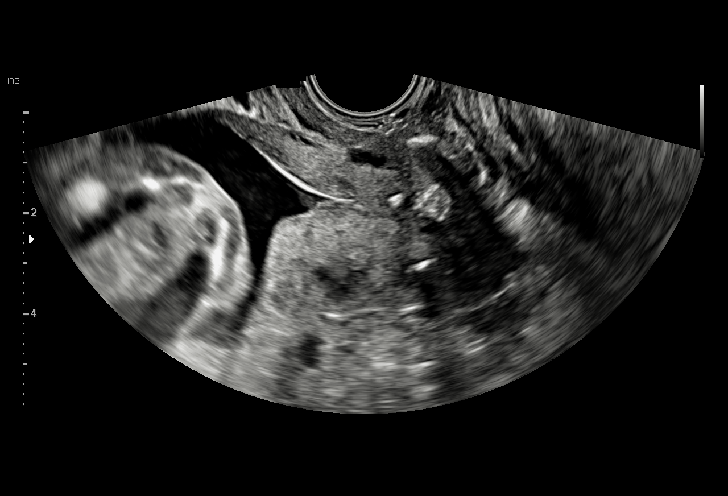
[im 21/21]
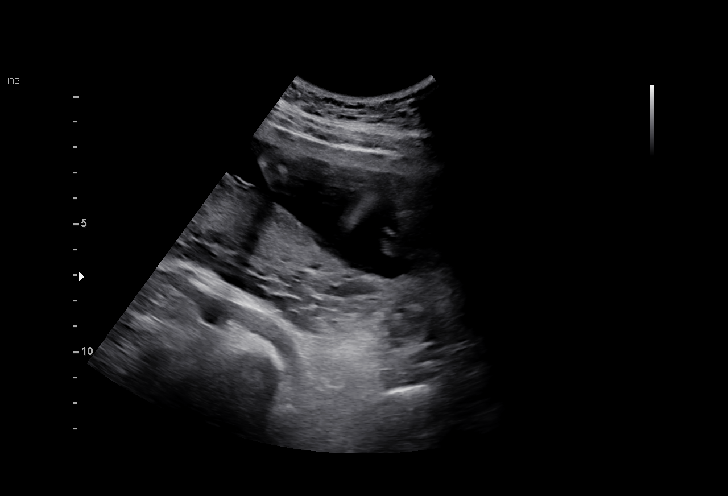

[15 of 21 positions shown; findings below may reference images not displayed]

Indications

 Cervical cerclage suture present, second
 trimester
 Medical complication of pregnancy (cervical
 mass)
 Advanced maternal age multigravida 35+,
 second trimester
 18 weeks gestation of pregnancy
Fetal Evaluation

 Num Of Fetuses:         1
 Fetal Heart Rate(bpm):  171
 Cardiac Activity:       Observed
 Presentation:           Breech, footling
 Placenta:               Posterior

 Amniotic Fluid
 AFI FV:      Within normal limits
OB History

 Gravidity:    6         Term:   1        Prem:   1        SAB:   2
 Living:       2
Gestational Age
 LMP:           18w 2d        Date:  03/07/20                 EDD:   12/12/20
 Best:          18w 2d     Det. By:  LMP  (03/07/20)          EDD:   12/12/20
Cervix Uterus Adnexa

 Cervix
 Length:           2.57  cm.
 Measured transvaginally. Cerclage visualized, mass of cervix
Comments

 This patient was seen for a transvaginal ultrasound today due
 to a history of cervical incompetence.  She currently has a
 cervical cerclage in place.  She is receiving weekly 17 P
 injections.  She denies any lower abdominal cramping and
 denies any problems since her last exam.

 A viable singleton intrauterine pregnancy was noted today.

 On a transvaginal ultrasound performed today, her cervical
 length measured 2.57 cm long.  There were no signs of
 funneling noted today.  The cervical cerclage stitch was
 visualized.  The mass in the cervix was also visualized.

 The patient was reassured that based on her cervical length
 measured today, her risk of a preterm birth is low at this time.

 She will return in 1 week for a detailed fetal anatomy scan.

## 2021-02-17 ENCOUNTER — Encounter: Payer: Self-pay | Admitting: General Practice

## 2021-02-24 ENCOUNTER — Other Ambulatory Visit: Payer: Self-pay

## 2021-02-24 ENCOUNTER — Ambulatory Visit: Payer: Medicaid Other | Admitting: Family Medicine

## 2021-02-24 ENCOUNTER — Encounter: Payer: Self-pay | Admitting: Family Medicine

## 2021-02-24 DIAGNOSIS — L301 Dyshidrosis [pompholyx]: Secondary | ICD-10-CM | POA: Insufficient documentation

## 2021-02-24 DIAGNOSIS — L309 Dermatitis, unspecified: Secondary | ICD-10-CM | POA: Diagnosis present

## 2021-02-24 MED ORDER — HYDROCORTISONE 1 % EX OINT: 1.0000 "application " | TOPICAL_OINTMENT | Freq: Two times a day (BID) | CUTANEOUS | 0 refills | Status: DC

## 2021-02-24 NOTE — Assessment & Plan Note (Signed)
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 Considered scabies and tinea however less likely based on history and physical exam today. No sick contacts. Recommended hydrocortisone 1% cream BID to affected area along with regular emmollient. Follow up with PCP if persistent symptoms.

## 2021-02-24 NOTE — Progress Notes (Signed)
     SUBJECTIVE:   CHIEF COMPLAINT / HPI:   Pam Vargas is a 40 y.o. female presents for allergic reaction   Allergic reaction  1 week history of pruritus between 3rd and 4th right finger and it has spread to the other hand and to the palm. Pt has dry skin at baseline and she thinks she may have eczema. She has tried Aveeno cream which made it worse. No hx of scabies. Son is 5 months who is not in day care. No sick contacts. Switched her washing powder to a hypoallergenic for her son. No insect bites.  No changes in jewelry.  [facility] Office Visit from 02/24/2021 in [place]  PHQ-9 Total Score 0       Health Maintenance Due  Topic  . COVID-19 Vaccine (4 - Booster for [employer] series)      PERTINENT  PMH / PSH:  SVT, breast cyst   OBJECTIVE:   BP 110/64   Pulse 74   Wt 131 lb (59.4 kg)   LMP 02/24/2021   Breastfeeding No   BMI 22.49 kg/m    General: Alert, no acute distress Cardio: Well-perfused Pulm:  normal work of breathing Neuro: Cranial nerves grossly intact  Hand: Dry, flaky skin with fissure/cracks in finger webs bilaterally, no obvious erythema or edema.      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 Considered scabies and tinea however less likely based on history and physical exam today. No sick contacts. Recommended hydrocortisone 1% cream BID to affected area along with regular emmollient. Follow up with PCP if persistent symptoms.      Lattie Haw, MD PGY-2 [place]

## 2021-02-24 NOTE — Patient Instructions (Addendum)
Thank you for coming to see me today. It was a pleasure. Today we discussed your finger itching. It could be hand eczema. I recommend hydrocortisone cream to the areas twice daily.   Please follow-up with PCP if no improvement.   If you have any questions or concerns, please do not hesitate to call the office at [phone].  Best wishes,   Dr Posey Pronto    Dyshidrotic Eczema Dyshidrotic eczema, also known as pompholyx, is a type of eczema that causes very itchy, fluid-filled blisters (vesicles) to form on the hands and feet. It is more common before age 79, though it can affect people of any age. There is no cure, but treatment and certain lifestyle changes can help relieve symptoms. What are the causes? The cause of this condition is not known. What increases the risk? You are more likely to develop this condition if:  You wash your hands frequently.  You have a personal or family history of eczema, allergies, asthma, or hay fever.  You are allergic to metals, such as nickel or cobalt.  You work with cement.  You smoke. What are the signs or symptoms? Symptoms of this condition may affect the hands, the feet, or both. Symptoms may come and go (recur), and may include:  Severe itching. This may happen before blisters appear.  Blisters. These may form suddenly. ? In the early stages, blisters may form near the fingertips. ? In severe cases, blisters may grow to large blister masses (bullae). ? Blisters resolve in 2-3 weeks without bursting. This is followed by a dry phase in which itching eases.  Pain and swelling.  Cracks or long, narrow openings (fissures) in the skin.  Severe dryness.  Ridges on the nails. How is this diagnosed? This condition may be diagnosed based on:  Your symptoms and a physical exam.  Your medical history.  Skin scrapings to rule out a fungal infection.  Testing a swab of fluid for bacteria (culture).  Removing a small piece of skin  (biopsy) to test for infection or to rule out other conditions.  Skin patch tests. These tests involve using patches that contain possible allergens and placing them on your back. Your health care provider will wait a few days and then check to see if an allergic reaction occurred. These tests may be done if your health care provider suspects allergic reactions, or to rule out other types of eczema. You may be referred to a health care provider who specializes in skin conditions (dermatologist) to help diagnose and treat this condition. How is this treated? There is no cure for this condition, but treatment can help relieve symptoms. Depending on the amount and severity of the blisters, your health care provider may suggest:  Avoiding allergens, irritants, or triggers that worsen symptoms. This may involve lifestyle changes, such as: ? Using different lotions or soaps. ? Avoiding hot weather or places that will cause you to sweat a lot. ? Managing stress with coping techniques, such as relaxation and exercise, and asking for help when you need it. ? Diet changes as recommended by your health care provider.  Using a clean, damp towel (cool compress) to relieve symptoms.  Soaking in a bath that contains a type of salt that relieves irritation (aluminum acetate soaks).  Medicines, such as: ? Medicine taken by mouth to reduce itching (oral antihistamines). ? Medicine applied to the skin to reduce swelling and irritation (topical corticosteroids). ? Medicine that reduces the activity of the body's disease-fighting  system (immunosuppressants) to treat inflammation. This may be given in severe cases. ? Antibiotic medicines to treat bacterial infection.  Light therapy (phototherapy). This involves shining ultraviolet (UV) light on the affected skin in order to reduce itchiness and inflammation.   Follow these instructions at home: Bathing and skin care  Wash skin gently. After bathing or washing  your hands, pat your skin dry. Avoid rubbing your skin.  Remove all jewelry before bathing. If the skin under the jewelry stays wet, blisters may form or get worse.  Apply cool compresses as told by your health care provider. To do this: ? Soak a clean towel in cool water. ? Wring out excess water until towel is damp. ? Place the towel over the affected skin. Leave the towel on for 20 minutes at a time, 2-3 times a day.  Use mild soaps, cleansers, and lotions that do not contain dyes, perfumes, or other irritants.  Keep your skin hydrated. To do this: ? Avoid very hot water. Take lukewarm baths or showers. ? Apply moisturizer within 3 minutes of bathing. This locks in moisture.   Medicines  Take and apply over-the-counter and prescription medicines only as told by your health care provider.  If you were prescribed an antibiotic medicine, take or apply it as told by your health care provider. Do not stop using the antibiotic even if you start to feel better. General instructions  Do not use any products that contain nicotine or tobacco. These include cigarettes, chewing tobacco, and vaping devices, such as e-cigarettes. If you need help quitting, ask your health care provider.  Identify and avoid triggers and allergens.  Keep fingernails short to avoid breaking the skin while scratching.  Use waterproof gloves to protect your hands when doing work that keeps your hands wet for a long time.  Wear socks to keep your feet dry.  Keep all follow-up visits. This is important. Contact a health care provider if:  You have symptoms that do not go away.  You have signs of infection, such as: ? Crusting, pus, or a bad smell. ? More redness, swelling, or pain. ? Increased warmth in the affected area. Get help right away if:  Your skin gets streaking redness with associated pain. Summary  Dyshidrotic eczema, also known as pompholyx, is a type of eczema that causes very itchy,  fluid-filled blisters (vesicles) to form on the hands and feet.  The cause of this condition is not known.  There is no cure for this condition, but treatment can help relieve symptoms. Treatment depends on the amount and severity of the blisters.  Use mild soaps, cleansers, and lotions that do not contain dyes, perfumes, or other irritants. Keep your skin hydrated. This information is not intended to replace advice given to you by your health care provider. Make sure you discuss any questions you have with your health care provider. Document Revised: 07/27/2020 Document Reviewed: 07/27/2020 Elsevier Patient Education  2021 [employer].

## 2021-03-03 ENCOUNTER — Encounter (HOSPITAL_COMMUNITY): Payer: Self-pay

## 2021-03-03 ENCOUNTER — Emergency Department (HOSPITAL_COMMUNITY)
Admission: EM | Admit: 2021-03-03 | Discharge: 2021-03-04 | Disposition: A | Discharge status: Home / Self Care | Payer: Medicaid Other | Attending: Emergency Medicine | Admitting: Emergency Medicine

## 2021-03-03 ENCOUNTER — Other Ambulatory Visit: Payer: Self-pay

## 2021-03-03 DIAGNOSIS — K0889 Other specified disorders of teeth and supporting structures: Secondary | ICD-10-CM | POA: Insufficient documentation

## 2021-03-03 DIAGNOSIS — K002 Abnormalities of size and form of teeth: Secondary | ICD-10-CM | POA: Diagnosis not present

## 2021-03-03 DIAGNOSIS — Z87891 Personal history of nicotine dependence: Secondary | ICD-10-CM | POA: Diagnosis not present

## 2021-03-03 DIAGNOSIS — R000 Tachycardia, unspecified: Secondary | ICD-10-CM | POA: Diagnosis not present

## 2021-03-03 DIAGNOSIS — K029 Dental caries, unspecified: Secondary | ICD-10-CM | POA: Insufficient documentation

## 2021-03-03 NOTE — ED Triage Notes (Signed)
Pt reports left jaw swelling and left upper dental pain since this morning.

## 2021-03-04 ENCOUNTER — Emergency Department (HOSPITAL_COMMUNITY)
Admission: EM | Admit: 2021-03-04 | Discharge: 2021-03-04 | Disposition: A | Discharge status: Home / Self Care | Payer: Medicaid Other | Source: Home / Self Care | Attending: Emergency Medicine | Admitting: Emergency Medicine

## 2021-03-04 ENCOUNTER — Encounter (HOSPITAL_COMMUNITY): Payer: Self-pay

## 2021-03-04 ENCOUNTER — Other Ambulatory Visit: Payer: Self-pay

## 2021-03-04 DIAGNOSIS — K029 Dental caries, unspecified: Secondary | ICD-10-CM | POA: Insufficient documentation

## 2021-03-04 DIAGNOSIS — K002 Abnormalities of size and form of teeth: Secondary | ICD-10-CM | POA: Insufficient documentation

## 2021-03-04 DIAGNOSIS — Z87891 Personal history of nicotine dependence: Secondary | ICD-10-CM | POA: Insufficient documentation

## 2021-03-04 DIAGNOSIS — R000 Tachycardia, unspecified: Secondary | ICD-10-CM | POA: Insufficient documentation

## 2021-03-04 DIAGNOSIS — K0889 Other specified disorders of teeth and supporting structures: Secondary | ICD-10-CM

## 2021-03-04 MED ORDER — NAPROXEN 500 MG PO TABS
500.0000 mg | ORAL_TABLET | Freq: Once | ORAL | Status: AC
  Administered 2021-03-04: 500 mg via ORAL
  Filled 2021-03-04: qty 1

## 2021-03-04 MED ORDER — AMOXICILLIN 500 MG PO CAPS
500.0000 mg | ORAL_CAPSULE | Freq: Once | ORAL | Status: AC
  Administered 2021-03-04: 500 mg via ORAL
  Filled 2021-03-04: qty 1

## 2021-03-04 MED ORDER — AMOXICILLIN 500 MG PO CAPS: 500.0000 mg | ORAL_CAPSULE | Freq: Three times a day (TID) | ORAL | 0 refills | Status: DC

## 2021-03-04 MED ORDER — LIDOCAINE VISCOUS HCL 2 % MT SOLN: 10.0000 mL | Freq: Four times a day (QID) | OROMUCOSAL | 0 refills | Status: DC | PRN

## 2021-03-04 MED ORDER — CHLORHEXIDINE GLUCONATE 0.12 % MT SOLN: 15.0000 mL | Freq: Two times a day (BID) | OROMUCOSAL | 0 refills | Status: DC

## 2021-03-04 MED ORDER — OXYCODONE-ACETAMINOPHEN 5-325 MG PO TABS
1.0000 | ORAL_TABLET | Freq: Three times a day (TID) | ORAL | 0 refills | Status: AC | PRN
Start: 2021-03-04 — End: 2021-03-06

## 2021-03-04 MED ORDER — NAPROXEN 500 MG PO TABS: 500.0000 mg | ORAL_TABLET | Freq: Two times a day (BID) | ORAL | 0 refills | Status: DC

## 2021-03-04 NOTE — Discharge Instructions (Addendum)
You came to the emergency department to be evaluated for your dental pain.  There is no obvious abscess that need to be drained at this time.  Please continue the antibiotic medication that you were prescribed.  I given you prescription for viscous lidocaine.  You may swish this around your mouth and spit to help relieve your pain.  I have also given you prescription for an antibiotic mouthwash.  Please use this twice a day.  Have given you prescription for pain medication you may take 1 pill every 8 hours as needed for severe pain.  Do not combine this medication with alcohol or other drugs.  Please do not Drive or operate heavy machinery while taking this medication.  Please follow-up with the dentist you were given information for earlier.  I have also given you a list of other local dentist in the area.

## 2021-03-04 NOTE — Discharge Instructions (Addendum)
Take the antibiotics and pain medication as prescribed.  Follow-up with a dentist.  Return to the ED with difficulty breathing, difficulty swallowing or any other concerns.

## 2021-03-04 NOTE — ED Provider Notes (Signed)
[facility] DEPT Provider Note   CSN: [id number] Arrival date & time: 03/04/21  1800     History Chief Complaint  Patient presents with  . Dental Pain    Pam Vargas is a 40 y.o. female  presents with a chief complaint of swelling and pain to left jaw.  Patient was seen by provider yesterday at [facility] for the same complaints.  Patient was started on amoxicillin and told to follow up with dentist.    Patient reports that pain has increased since last seen seen by provider.  Patient also reports minimal increase in her swelling as well.  Pain is located to left upper jawline.  Ppatient rates pain 8/10 on the pain scale.  Pain is describes as "throbbing."  Patient denies any alleviating factors.  Patient complains of increased pain with touch of affected area.  Denies any fever, chills, trouble swallowing, drooling, change in voice, purulent discharge.   HPI     Past Medical History:  Diagnosis Date  . Abnormal uterine bleeding (AUB) 03/23/2020  . Advanced maternal age in multigravida   . Alpha thalassemia silent carrier   . Anemia   . Blood transfusion without reported diagnosis    "I think so with my first delivery"  . Cervical cerclage suture present 06/19/2020   06/19/20: ppx mcdonald cerclage with mersilene tape. Knot anteriorly.   . Cervical mass 04/2020  . GBS (group B Streptococcus carrier), +RV culture, currently pregnant 08/31/2020  . Medical history non-contributory    cerclage placed August 20,2021-recieving [name] injections weekly  . Palpitations   . Preterm labor   . PROM (premature rupture of membranes) 08/28/2020  . Recurrent upper respiratory infection (URI)   . Sinus tachycardia   . UTI (urinary tract infection)     Patient Active Problem List   Diagnosis Date Noted  . 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 Postpartum care following vaginal delivery 10/13/2020  . SVT (supraventricular tachycardia) ([place])   . Alpha  thalassemia silent carrier 06/19/2020  . Single nabothian cyst 06/03/2020  . Subchorionic hemorrhage in first trimester 05/13/2020  . Dry skin 03/23/2020  . Benign cyst of breast 06/05/2017  . History of preterm delivery 09/12/2016  . Previous cesarean section 09/12/2016  . TOBACCO USER 11/02/2009    Past Surgical History:  Procedure Laterality Date  . CERVICAL CERCLAGE N/A 06/19/2020   Procedure: CERCLAGE CERVICAL;  Surgeon: Aletha Halim, MD;  Location: [facility] LD ORS;  Service: Gynecology;  Laterality: N/A;  . CESAREAN SECTION    . FOOT SURGERY    . FOOT SURGERY       OB History    Gravida  6   Para  3   Term  1   Preterm  0   AB  2   Living  2     SAB  2   IAB  0   Ectopic  0   Multiple  0   Live Births  2           Family History  Problem Relation Age of Onset  . Diabetes Mother   . Hyperlipidemia Mother   . Hypertension Mother   . Heart disease Father   . Hypertension Father   . Breast cancer Maternal Aunt     Social History   Tobacco Use  . Smoking status: Former Smoker    Packs/day: 0.30    Types: Cigarettes    Quit date: 05/29/2020    Years since quitting: 0.7  .  Smokeless tobacco: Never Used  Vaping Use  . Vaping Use: Never used  Substance Use Topics  . Alcohol use: Not Currently    Comment: last drink 04/21/2018  . Drug use: No    Home Medications Prior to Admission medications   Medication Sig Start Date End Date Taking? Authorizing Provider  acetaminophen (TYLENOL) 500 MG tablet Take 500 mg by mouth every 6 (six) hours as needed for mild pain or headache.     [provider]  amoxicillin (AMOXIL) 500 MG capsule Take 1 capsule (500 mg total) by mouth 3 (three) times daily. 03/04/21   Rancour, Annie Main, MD  Blood Pressure Monitoring (BLOOD PRESSURE KIT) DEVI 1 Device by Does not apply route as needed. 05/27/20   Aletha Halim, MD  ferrous sulfate 325 (65 FE) MG tablet Take by mouth. 09/08/20   [provider]   hydrocortisone 1 % ointment Apply 1 application topically 2 (two) times daily. 02/24/21   Lattie Haw, MD  metoprolol tartrate (LOPRESSOR) 25 MG tablet Take 0.5 tablets (12.5 mg total) by mouth 2 (two) times daily. 11/02/20   Werner Lean, MD  naproxen (NAPROSYN) 500 MG tablet Take 1 tablet (500 mg total) by mouth 2 (two) times daily with a meal. 03/04/21   Rancour, Annie Main, MD  Prenatal Vit-Fe Phos-FA-Omega (VITAFOL GUMMIES) 3.33-0.333-34.8 MG CHEW CHEW 3 GUMMIES BY MOUTH EVERY DAY 07/16/20   Mullis, Kiersten P, DO    Allergies    Patient has no known allergies.  Review of Systems   Review of Systems  Constitutional: Negative for chills and fever.  HENT: Positive for dental problem. Negative for drooling, trouble swallowing and voice change.   Respiratory: Negative for shortness of breath.   Musculoskeletal: Negative for neck pain and neck stiffness.    Physical Exam Updated Vital Signs BP 124/64 (BP Location: Left Arm)   Pulse (!) 103   Temp 98.5 F (36.9 C) (Oral)   Resp 18   Ht '5\' 4"'  (1.626 m)   Wt 60.8 kg   LMP 02/24/2021   SpO2 100%   BMI 23.00 kg/m   Physical Exam Vitals and nursing note reviewed.  Constitutional:      General: She is not in acute distress.    Appearance: She is not ill-appearing, toxic-appearing or diaphoretic.  HENT:     Head: Normocephalic. No raccoon eyes, abrasion, contusion, masses, right periorbital erythema, left periorbital erythema or laceration.     Jaw: No trismus, tenderness, swelling or pain on movement.     Mouth/Throat:     Lips: No lesions.     Mouth: Mucous membranes are moist. No injury, lacerations, oral lesions or angioedema.     Dentition: Abnormal dentition. Dental caries present. No dental tenderness, gingival swelling or dental abscesses.     Tongue: No lesions. Tongue does not deviate from midline.     Palate: No lesions.     Pharynx: Oropharynx is clear. Uvula midline. No pharyngeal swelling, oropharyngeal  exudate, posterior oropharyngeal erythema or uvula swelling.     Comments: Poor dentition with multiple missing teeth and multiple teeth with dental caries  Patient has tenderness to gumline at location of first upper left molar however no tooth observed Eyes:     General: No scleral icterus.       Right eye: No discharge.        Left eye: No discharge.  Cardiovascular:     Rate and Rhythm: Tachycardia present.     Comments: Tachy at rate  of 103  Pulmonary:     Effort: Pulmonary effort is normal. No respiratory distress.     Breath sounds: No stridor.  Musculoskeletal:     Cervical back: Full passive range of motion without pain. No edema, signs of trauma, rigidity or torticollis. No pain with movement.  Skin:    General: Skin is warm and dry.     Coloration: Skin is not jaundiced or pale.  Neurological:     General: No focal deficit present.     Mental Status: She is alert.  Psychiatric:        Behavior: Behavior is cooperative.     ED Results / Procedures / Treatments   Labs (all labs ordered are listed, but only abnormal results are displayed) Labs Reviewed - No data to display  EKG None  Radiology No results found.  Procedures Procedures   Medications Ordered in ED Medications - No data to display  ED Course  I have reviewed the triage vital signs and the nursing notes.  Pertinent labs & imaging results that were available during my care of the patient were reviewed by me and considered in my medical decision making (see chart for details).    MDM Rules/Calculators/A&P                          Alert 40 year old female in no acute distress, nontoxic-appearing.  Patient presents with chief complaint of left upper jaw pain.  Patient also endorses swelling to affected area.  Patient was seen by provider yesterday and diagnosed with dental pain.  Patient was started on amoxicillin and given information to follow-up with dentist.  Patient presents today with  complaints of increase in her pain and minimal increase in swelling.  Patient denies fevers, chills, drooling, trouble swallowing, difficulty breathing.  Patient noted to be afebrile and hemodynamically stable.  On physical exam patient has poor dentition with multiple missing teeth and multiple dental caries.  Patient complains of pain to upper left gumline at area first molar however tooth is missing.  No fluctuance observed.  Patient has no swelling to oropharynx.  Patient has no swelling or edema to neck.  Patient has no pain with passive range of motion of neck.  No evidence of dental abscess, Ludwig angina, angioedema.  Will prescribe patient viscous lidocaine, Peridex and short course of pain medication.  Importance of antibiotic treatment and close follow-up with dentist was stressed to patient.  Patient given strict return precautions.  Patient expressed understanding of all instructions and is agreeable with this plan.    Final Clinical Impression(s) / ED Diagnoses Final diagnoses:  Pain, dental    Rx / DC Orders ED Discharge Orders         Ordered    lidocaine (XYLOCAINE) 2 % solution  Every 6 hours PRN        03/04/21 1835    oxyCODONE-acetaminophen (PERCOCET/ROXICET) 5-325 MG tablet  Every 8 hours PRN        03/04/21 1835    chlorhexidine (PERIDEX) 0.12 % solution  2 times daily        03/04/21 1837           Loni Beckwith, PA-C 03/05/21 0017    Drenda Freeze, MD 03/06/21 1537

## 2021-03-04 NOTE — ED Provider Notes (Signed)
[facility] DEPT Provider Note   CSN: [id number] Arrival date & time: 03/03/21  2138     History Chief Complaint  Patient presents with  . Dental Pain    Pam Vargas is a 40 y.o. female.  Patient with swelling and pain to her left upper jaw since this morning.  Denies any fall or trauma.  Denies any bleeding or drainage.  She states she does not have a tooth in this area but feels like her upper jaw swollen.  Denies any difficulty breathing or difficulty swallowing.  No chest pain or shortness of breath.  Did not take any medications at home.  Denies any difficulty breathing or difficulty swallowing.  The history is provided by the patient.  Dental Pain Associated symptoms: no fever and no headaches        Past Medical History:  Diagnosis Date  . Abnormal uterine bleeding (AUB) 03/23/2020  . Advanced maternal age in multigravida   . Alpha thalassemia silent carrier   . Anemia   . Blood transfusion without reported diagnosis    "I think so with my first delivery"  . Cervical cerclage suture present 06/19/2020   06/19/20: ppx mcdonald cerclage with mersilene tape. Knot anteriorly.   . Cervical mass 04/2020  . GBS (group B Streptococcus carrier), +RV culture, currently pregnant 08/31/2020  . Medical history non-contributory    cerclage placed August 20,2021-recieving [name] injections weekly  . Palpitations   . Preterm labor   . PROM (premature rupture of membranes) 08/28/2020  . Recurrent upper respiratory infection (URI)   . Sinus tachycardia   . UTI (urinary tract infection)     Patient Active Problem List   Diagnosis Date Noted  . 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 Postpartum care following vaginal delivery 10/13/2020  . SVT (supraventricular tachycardia) ([place])   . Alpha thalassemia silent carrier 06/19/2020  . Single nabothian cyst 06/03/2020  . Subchorionic hemorrhage in first trimester 05/13/2020  . Dry skin 03/23/2020  . Benign  cyst of breast 06/05/2017  . History of preterm delivery 09/12/2016  . Previous cesarean section 09/12/2016  . TOBACCO USER 11/02/2009    Past Surgical History:  Procedure Laterality Date  . CERVICAL CERCLAGE N/A 06/19/2020   Procedure: CERCLAGE CERVICAL;  Surgeon: Aletha Halim, MD;  Location: [facility] ORS;  Service: Gynecology;  Laterality: N/A;  . CESAREAN SECTION    . FOOT SURGERY    . FOOT SURGERY       OB History    Gravida  6   Para  3   Term  1   Preterm  0   AB  2   Living  2     SAB  2   IAB  0   Ectopic  0   Multiple  0   Live Births  2           Family History  Problem Relation Age of Onset  . Diabetes Mother   . Hyperlipidemia Mother   . Hypertension Mother   . Heart disease Father   . Hypertension Father   . Breast cancer Maternal Aunt     Social History   Tobacco Use  . Smoking status: Former Smoker    Packs/day: 0.30    Types: Cigarettes    Quit date: 05/29/2020    Years since quitting: 0.7  . Smokeless tobacco: Never Used  Vaping Use  . Vaping Use: Never used  Substance Use Topics  . Alcohol use: Not  Currently    Comment: last drink 04/21/2018  . Drug use: No    Home Medications Prior to Admission medications   Medication Sig Start Date End Date Taking? Authorizing Provider  acetaminophen (TYLENOL) 500 MG tablet Take 500 mg by mouth every 6 (six) hours as needed for mild pain or headache.     [provider]  Blood Pressure Monitoring (BLOOD PRESSURE KIT) DEVI 1 Device by Does not apply route as needed. 05/27/20   Aletha Halim, MD  ferrous sulfate 325 (65 FE) MG tablet Take by mouth. 09/08/20   [provider]  hydrocortisone 1 % ointment Apply 1 application topically 2 (two) times daily. 02/24/21   Lattie Haw, MD  metoprolol tartrate (LOPRESSOR) 25 MG tablet Take 0.5 tablets (12.5 mg total) by mouth 2 (two) times daily. 11/02/20   Werner Lean, MD  Prenatal Vit-Fe Phos-FA-Omega (VITAFOL  GUMMIES) 3.33-0.333-34.8 MG CHEW CHEW 3 GUMMIES BY MOUTH EVERY DAY 07/16/20   Mullis, Kiersten P, DO    Allergies    Patient has no known allergies.  Review of Systems   Review of Systems  Constitutional: Negative for activity change, appetite change, fatigue and fever.  HENT: Positive for dental problem.   Respiratory: Negative for cough, chest tightness and shortness of breath.   Cardiovascular: Negative for chest pain.  Gastrointestinal: Negative for abdominal pain, nausea and vomiting.  Genitourinary: Negative for dysuria and genital sores.  Musculoskeletal: Negative for arthralgias and myalgias.  Skin: Negative for rash.  Neurological: Negative for dizziness, weakness and headaches.   all other systems are negative except as noted in the HPI and PMH.   Physical Exam Updated Vital Signs BP 120/66 (BP Location: Left Arm)   Pulse 78   Temp 98.4 F (36.9 C) (Oral)   Resp 18   LMP 02/24/2021   SpO2 100%   Physical Exam Vitals and nursing note reviewed.  Constitutional:      General: She is not in acute distress.    Appearance: She is well-developed.  HENT:     Head: Normocephalic and atraumatic.     Mouth/Throat:     Pharynx: No oropharyngeal exudate.     Comments: No obvious facial swelling.  Multiple missing teeth to the left upper jaw.  She is tender to the left upper molar without abscess.  No fluctuance appreciated.  Floor mouth is soft. Eyes:     Conjunctiva/sclera: Conjunctivae normal.     Pupils: Pupils are equal, round, and reactive to light.  Neck:     Comments: No meningismus. Cardiovascular:     Rate and Rhythm: Normal rate and regular rhythm.     Heart sounds: Normal heart sounds. No murmur heard.   Pulmonary:     Effort: Pulmonary effort is normal. No respiratory distress.     Breath sounds: Normal breath sounds.  Abdominal:     Palpations: Abdomen is soft.     Tenderness: There is no abdominal tenderness. There is no guarding or rebound.   Musculoskeletal:        General: No tenderness. Normal range of motion.     Cervical back: Normal range of motion and neck supple.  Skin:    General: Skin is warm.  Neurological:     Mental Status: She is alert and oriented to person, place, and time.     Cranial Nerves: No cranial nerve deficit.     Motor: No abnormal muscle tone.     Coordination: Coordination normal.  Comments: No ataxia on finger to nose bilaterally. No pronator drift. 5/5 strength throughout. CN 2-12 intact.Equal grip strength. Sensation intact.   Psychiatric:        Behavior: Behavior normal.     ED Results / Procedures / Treatments   Labs (all labs ordered are listed, but only abnormal results are displayed) Labs Reviewed - No data to display  EKG None  Radiology No results found.  Procedures Procedures   Medications Ordered in ED Medications - No data to display  ED Course  I have reviewed the triage vital signs and the nursing notes.  Pertinent labs & imaging results that were available during my care of the patient were reviewed by me and considered in my medical decision making (see chart for details).    MDM Rules/Calculators/A&P                         Dental pain without evidence of abscess or Ludwig's angina.  Will treat with warm compresses, antibiotics, anti-inflammatory medication and dental follow-up. Return precautions discussed  Final Clinical Impression(s) / ED Diagnoses Final diagnoses:  Pain, dental    Rx / DC Orders ED Discharge Orders    None       Staley Lunz, Annie Main, MD 03/04/21 [phone]

## 2021-03-04 NOTE — ED Triage Notes (Signed)
Patient c/o left upper dental pain and facial swelling since yesterday.

## 2021-03-05 ENCOUNTER — Telehealth: Payer: Self-pay | Admitting: *Deleted

## 2021-03-05 NOTE — Telephone Encounter (Signed)
Transition Care Management Unsuccessful Follow-up Telephone Call  Date of discharge and from where:  03/05/2021 - [facility]  Attempts:  1st Attempt  Reason for unsuccessful TCM follow-up call:  Left voice message

## 2021-03-08 NOTE — Telephone Encounter (Signed)
Transition Care Management Follow-up Telephone Call  Date of discharge and from where: 03/04/2021 from [facility]  How have you been since you were released from the hospital? Pt stated that she is feeling well and has no questions or concerns.   Any questions or concerns? No  Items Reviewed:  Did the pt receive and understand the discharge instructions provided? Yes   Medications obtained and verified? Yes   Other? No   Any new allergies since your discharge? No   Dietary orders reviewed? n/a  Do you have support at home? Yes   Functional Questionnaire: (I = Independent and D = Dependent) ADLs: I  Bathing/Dressing- I  Meal Prep- I  Eating- I  Maintaining continence- I  Transferring/Ambulation- I  Managing Meds- I   Follow up appointments reviewed:   PCP Hospital f/u appt confirmed? No    Specialist Hospital f/u appt confirmed? No    Are transportation arrangements needed? No   If their condition worsens, is the pt aware to call PCP or go to the Emergency Dept.? Yes  Was the patient provided with contact information for the PCP's office or ED? Yes  Was to pt encouraged to call back with questions or concerns? Yes

## 2021-03-12 ENCOUNTER — Other Ambulatory Visit: Payer: Self-pay | Admitting: Family Medicine

## 2021-03-17 ENCOUNTER — Ambulatory Visit: Payer: Medicaid Other | Admitting: Family Medicine

## 2021-03-17 ENCOUNTER — Other Ambulatory Visit (HOSPITAL_COMMUNITY)
Admission: RE | Admit: 2021-03-17 | Discharge: 2021-03-17 | Disposition: A | Discharge status: Home / Self Care | Payer: Medicaid Other | Source: Ambulatory Visit | Attending: Family Medicine | Admitting: Family Medicine

## 2021-03-17 ENCOUNTER — Other Ambulatory Visit: Payer: Self-pay

## 2021-03-17 VITALS — BP 100/62 | HR 65 | Ht 64.0 in | Wt 137.4 lb

## 2021-03-17 DIAGNOSIS — Z113 Encounter for screening for infections with a predominantly sexual mode of transmission: Secondary | ICD-10-CM

## 2021-03-17 DIAGNOSIS — B9689 Other specified bacterial agents as the cause of diseases classified elsewhere: Secondary | ICD-10-CM | POA: Diagnosis not present

## 2021-03-17 DIAGNOSIS — N898 Other specified noninflammatory disorders of vagina: Secondary | ICD-10-CM

## 2021-03-17 DIAGNOSIS — N760 Acute vaginitis: Secondary | ICD-10-CM | POA: Diagnosis present

## 2021-03-17 DIAGNOSIS — Z114 Encounter for screening for human immunodeficiency virus [HIV]: Secondary | ICD-10-CM

## 2021-03-17 LAB — POCT WET PREP (WET MOUNT)
Clue Cells Wet Prep Whiff POC: POSITIVE
Trichomonas Wet Prep HPF POC: ABSENT

## 2021-03-17 MED ORDER — METRONIDAZOLE 500 MG PO TABS: 500.0000 mg | ORAL_TABLET | Freq: Two times a day (BID) | ORAL | 0 refills | Status: AC

## 2021-03-17 NOTE — Assessment & Plan Note (Signed)
Wet prep and exam consistent with bacterial vaginosis.  Treat with Flagyl 500 mg twice daily for 7 days.  Advised to avoid intercourse during treatment for best results.  Return to care if no improvement.  STD testing per below.

## 2021-03-17 NOTE — Progress Notes (Signed)
    SUBJECTIVE:   CHIEF COMPLAINT / HPI:   Vaginal Discharge Discharge started 1 week ago Discharge appears normal She endorses vaginal odors Has itching, some pressure She denies abnormal vaginal bleeding, dysuria, hematuria, pelvic pain, nausea, vomiting, fevers She has used cranberry juice with no relief.    No history of STIs She is sexually active and does not use condoms, in a monogamous relationship Contraception: none, she declines.  She understands it is possible that she could get pregnant Patient's last menstrual period was 02/24/2021.  She does not douche. Last Pap: 09/20/2019 neg HPV with NILM Desires HIV/RPR: yes Prior Hep C Ab: Yes Throat or anal swab for G/C: throat and anal   PERTINENT  PMH / PSH: History of SVT, tobacco abuse, 6 months postpartum  OBJECTIVE:   BP 100/62   Pulse 65   Ht 5\' 4"  (1.626 m)   Wt 137 lb 6.4 oz (62.3 kg)   LMP 02/24/2021   SpO2 98%   BMI 23.58 kg/m    Physical Exam: General: In NAD Respiratory: Breathing comfortably on room air GU: Pelvic exam performed with patient supine.  Chaperone in room.  Bilateral labia without abnormalities.  Cervix exhibits thin, watery, white discharge, no cervix abnormalities.  No vaginal lesions.  Vaginal discharge thin, watery, white.  Results for orders placed or performed in visit on 03/17/21 (from the past 24 hour(s))  POCT Wet Prep [facility] [place])     Status: Abnormal   Collection Time: 03/17/21  9:40 AM  Result Value Ref Range   Source Wet Prep POC VAG    WBC, Wet Prep HPF POC NONE    Bacteria Wet Prep HPF POC Many (A) Few   Clue Cells Wet Prep HPF POC Moderate (A) None   Clue Cells Wet Prep Whiff POC Positive Whiff    Yeast Wet Prep HPF POC None None   KOH Wet Prep POC None None   Trichomonas Wet Prep HPF POC Absent Absent      ASSESSMENT/PLAN:   Bacterial vaginitis Wet prep and exam consistent with bacterial vaginosis.  Treat with Flagyl 500 mg twice daily for 7 days.  Advised to  avoid intercourse during treatment for best results.  Return to care if no improvement.  STD testing per below.  Screen for sexually transmitted diseases G/C performed oral, vaginal, anal at patient request.  Also performed HIV/RPR.  She is up-to-date on her Pap smear.  Did discuss that given she would not like to be on birth control, do recommend condoms to avoid pregnancy at this time given that she is 6 months postpartum.     Cleophas Dunker, [place]

## 2021-03-17 NOTE — Assessment & Plan Note (Addendum)
G/C performed oral, vaginal, anal at patient request.  Also performed HIV/RPR.  She is up-to-date on her Pap smear.  Did discuss that given she would not like to be on birth control, do recommend condoms to avoid pregnancy at this time given that she is 6 months postpartum.

## 2021-03-17 NOTE — Patient Instructions (Signed)
Thank you for coming to see me today. It was a pleasure. Today we talked about:   We performed STD testing today. This will take a few days to come back. If your MyChart is activated, we will message you on there if everything is normal, otherwise we will call. If we need to treat something we will also call you. If you do not hear from [place] in the next 4 days, please give [place] a call.  We will treat you for bacterial vaginosis.  You will take metronidazole twice daily for 7 days.  Come back if you do not have improvement in your symptoms.  If you have any questions or concerns, please do not hesitate to call the office at [phone].  Best,   [place] Constable, DO

## 2021-03-18 LAB — CERVICOVAGINAL ANCILLARY ONLY
Chlamydia: NEGATIVE
Chlamydia: NEGATIVE
Chlamydia: NEGATIVE
Comment: NEGATIVE
Comment: NEGATIVE
Comment: NEGATIVE
Comment: NORMAL
Comment: NORMAL
Comment: NORMAL
Neisseria Gonorrhea: NEGATIVE
Neisseria Gonorrhea: NEGATIVE
Neisseria Gonorrhea: NEGATIVE

## 2021-03-18 LAB — HIV ANTIBODY (ROUTINE TESTING W REFLEX): HIV Screen 4th Generation wRfx: NONREACTIVE

## 2021-03-18 LAB — RPR: RPR Ser Ql: NONREACTIVE

## 2021-03-31 IMAGING — US US MFM OB LIMITED
1 series · 14 of 14 positions shown · non-contrast
Comparison: none

[Series 1: us mfm ob limited · 14 acquisitions, 14 frames shown]
[im 1/14]
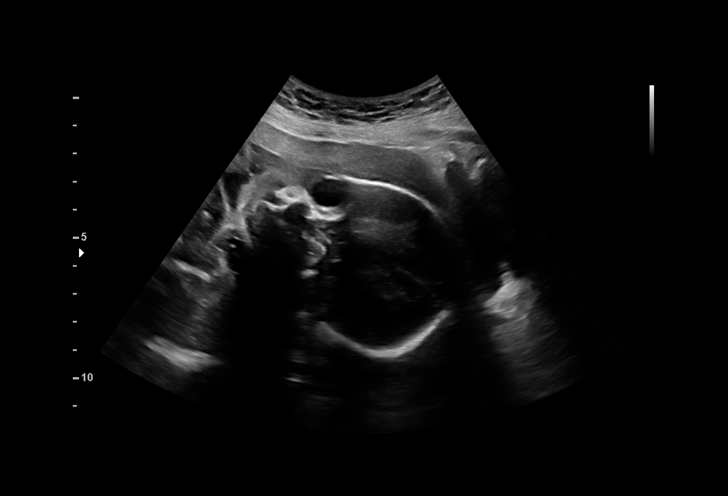
[im 2/14]
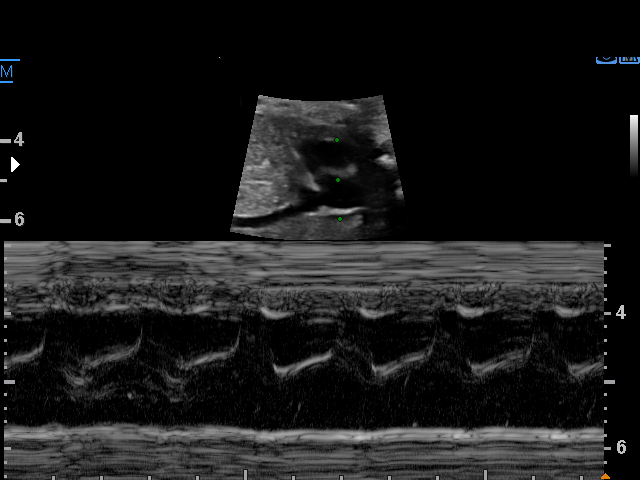
[im 3/14]
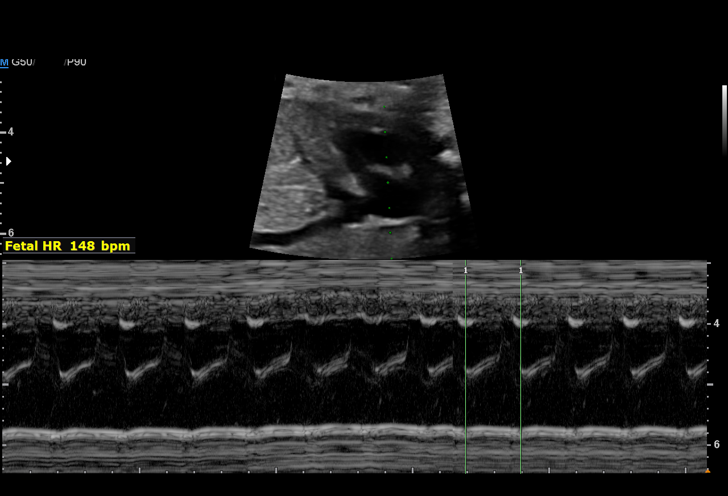
[im 4/14]
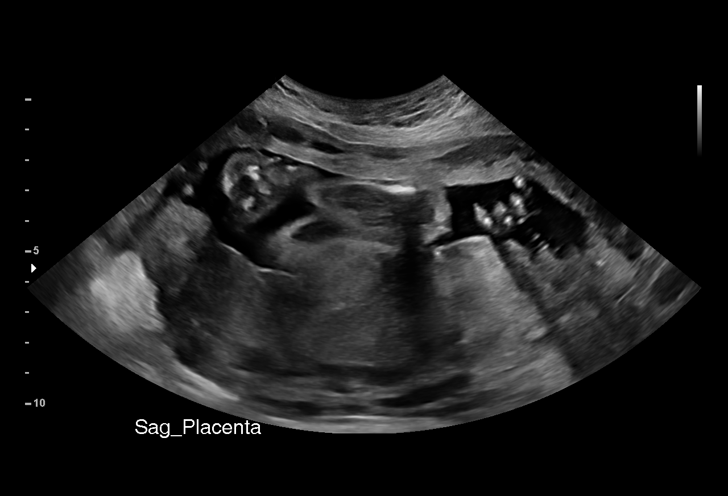
[im 5/14]
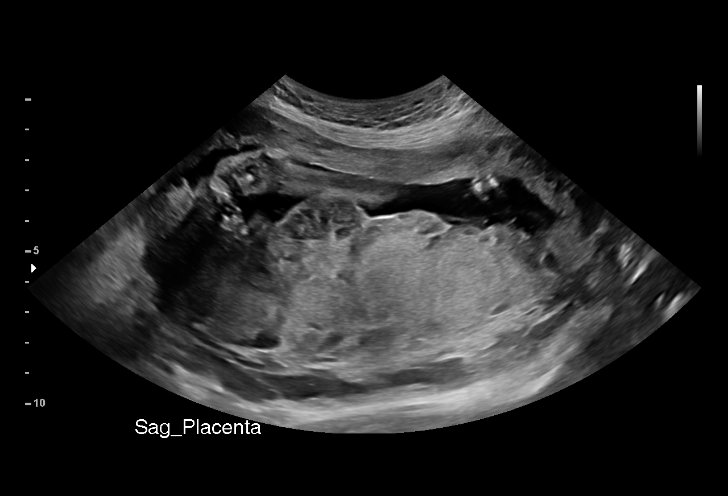
[im 6/14]
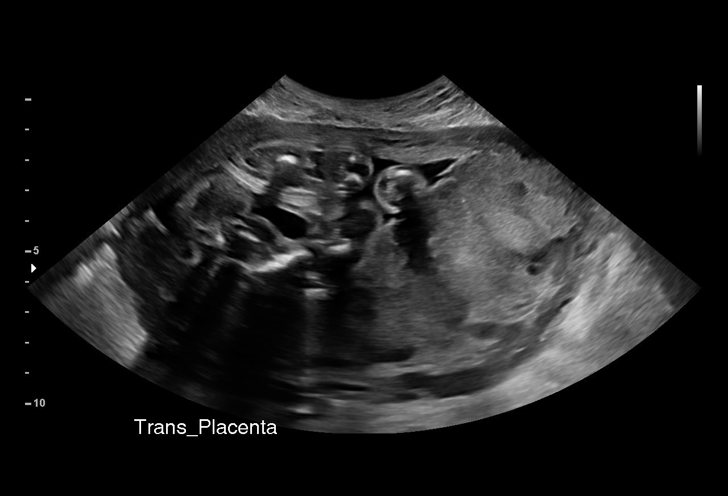
[im 7/14]
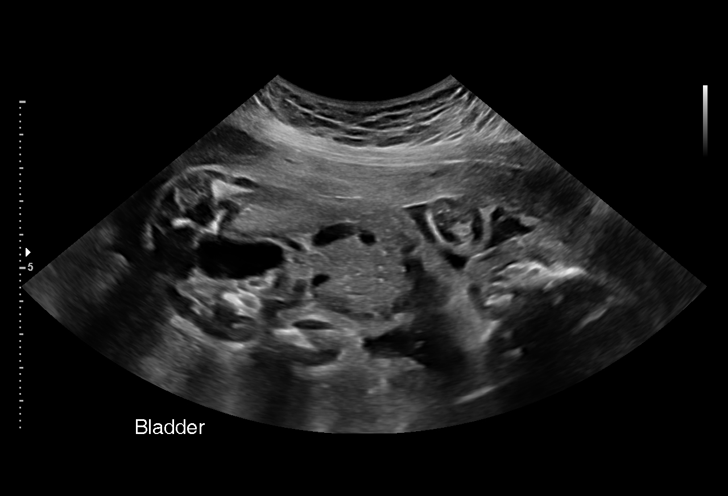
[im 8/14]
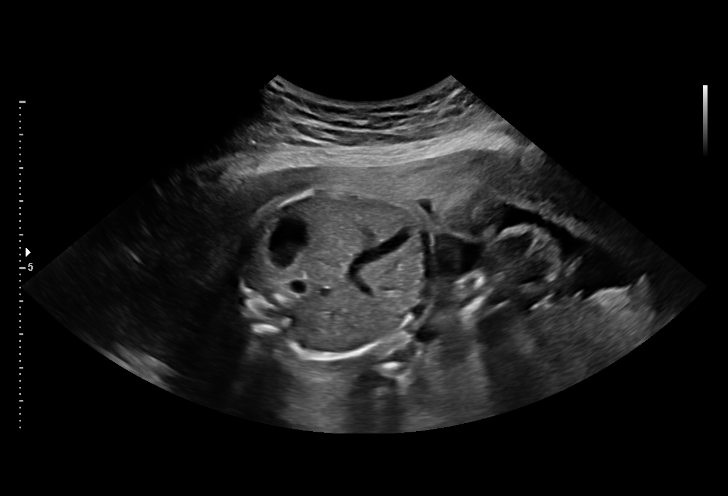
[im 9/14]
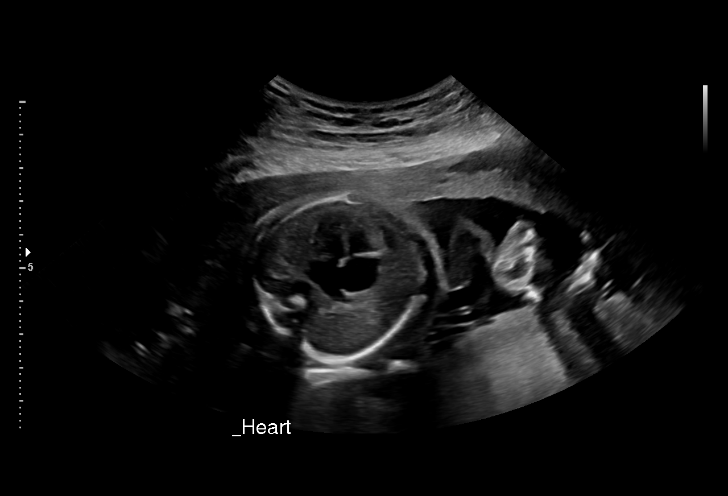
[im 10/14]
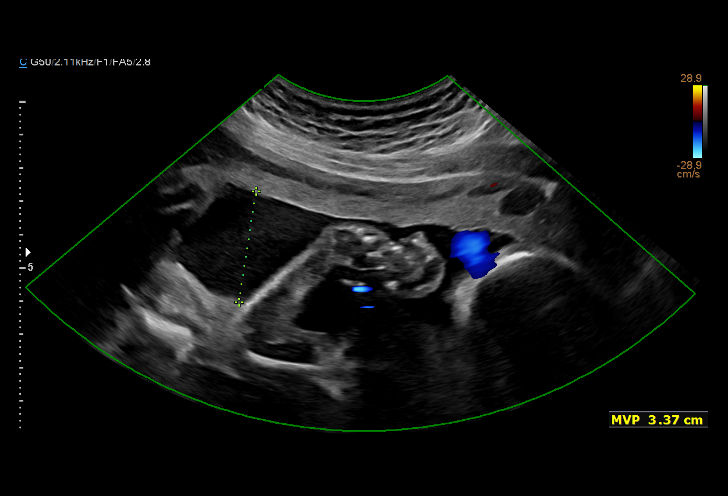
[im 11/14]
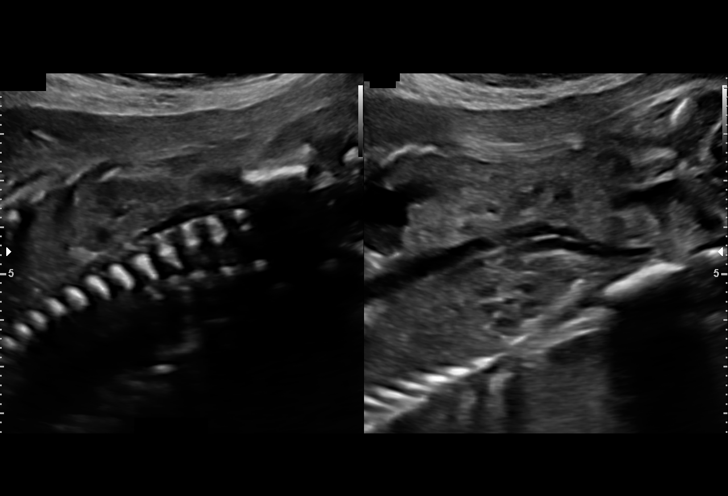
[im 12/14]
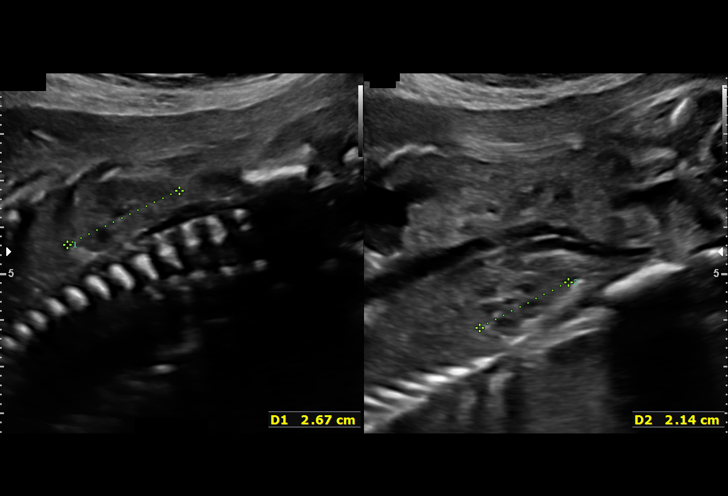
[im 13/14]
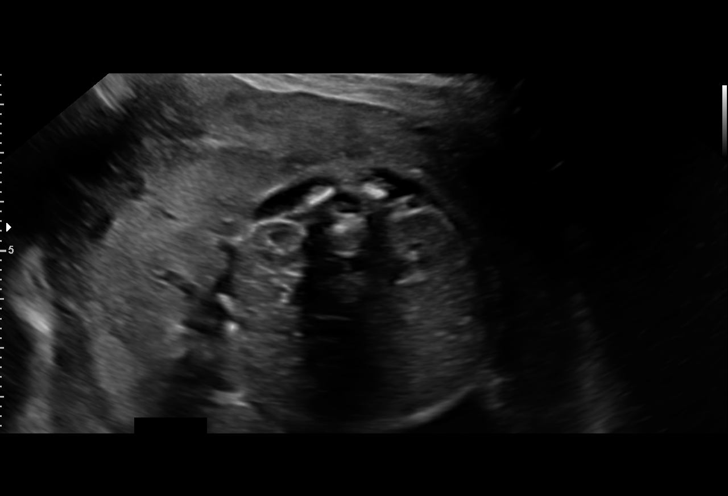
[im 14/14]
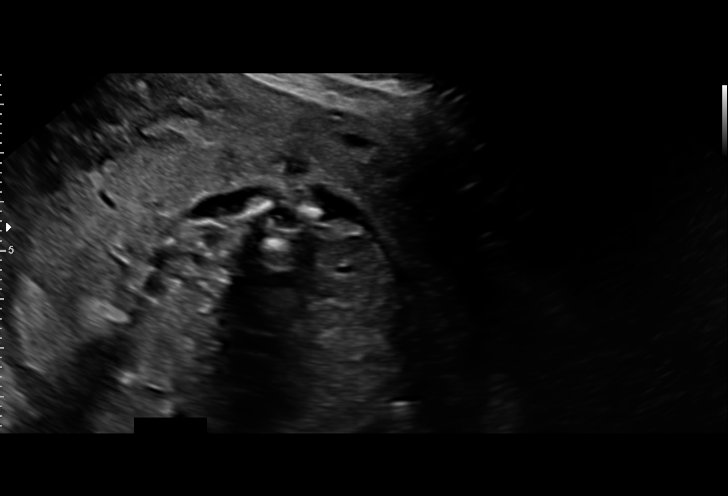

[14 of 14 positions shown; findings below may reference images not displayed]

1  US MFM OB LIMITED                     76815.01    DENISA MAZRAE

Indications

 Advanced maternal age multigravida 39,
 second trimester (low risk NIPS/neg AFP)
 Encounter for other antenatal screening
 follow-up
 Cervical cerclage suture present, second
 trimester
 Medical complication of pregnancy (cervical
 mass)
 Genetic carrier (Alpha Thalessemia - silent,
 Galactosemia, and SMA-silent)
 Poor obstetric history: Previous preterm
 delivery, antepartum
 24 weeks gestation of pregnancy
 Previous cesarean delivery, antepartum x 1
Fetal Evaluation

 Num Of Fetuses:          1
 Fetal Heart Rate(bpm):   148
 Cardiac Activity:        Observed
 Presentation:            Cephalic
 Placenta:                Posterior
 P. Cord Insertion:       Previously Visualized

 Amniotic Fluid
 AFI FV:      Within normal limits

                             Largest Pocket(cm)

OB History
 Gravidity:    6         Term:   1        Prem:   1        SAB:   2
 Living:       2
Gestational Age

 LMP:           24w 6d        Date:  03/07/20                 EDD:   12/12/20
 Best:          24w 6d     Det. By:  LMP  (03/07/20)          EDD:   12/12/20
Comments

 This patient presented to the [facility] due to PPROM.

 A limited ultrasound performed today shows that the fetus is
 in the vertex presentation.

 Low normal amniotic fluid is noted. A maximal vertical pocket
 of 3.37 cm is present.

## 2021-04-01 ENCOUNTER — Other Ambulatory Visit: Payer: Self-pay

## 2021-04-01 ENCOUNTER — Ambulatory Visit: Payer: Medicaid Other | Admitting: Student in an Organized Health Care Education/Training Program

## 2021-04-01 DIAGNOSIS — K0510 Chronic gingivitis, plaque induced: Secondary | ICD-10-CM | POA: Insufficient documentation

## 2021-04-01 DIAGNOSIS — R21 Rash and other nonspecific skin eruption: Secondary | ICD-10-CM | POA: Insufficient documentation

## 2021-04-01 MED ORDER — TRIAMCINOLONE ACETONIDE 0.5 % EX OINT: TOPICAL_OINTMENT | CUTANEOUS | 0 refills | Status: DC

## 2021-04-01 MED ORDER — PERMETHRIN 5 % EX CREA: TOPICAL_CREAM | CUTANEOUS | 0 refills | Status: DC

## 2021-04-01 NOTE — Progress Notes (Signed)
   SUBJECTIVE:   CHIEF COMPLAINT / HPI: hand rash  Hand rash- seen 4/27 for hand rash and diagnosed with dyshidrotic eczema.  She was given hydrocortisone 1% twice daily which she was adherent with and has not had improvement in her symptoms.  Appearance is similar to previous exam however, there are more excoriations today.  The rash is exclusively in the webbing of her fingers and has not become painful or weeping.  Just continues to be highly pruritic.  Denies systemic symptoms.  Gingivitis-seen in ED 5/4 and 5/5 for dental pain and put on antibiotics.  She continues to have mild pain in her gums and has not seen a dentist.  OBJECTIVE:   BP 94/62   Pulse 74   Wt 136 lb 12.8 oz (62.1 kg)   SpO2 98%   BMI 23.48 kg/m   Physical Exam Vitals and nursing note reviewed.  Constitutional:      General: She is not in acute distress.    Appearance: She is normal weight. She is not toxic-appearing.  HENT:     Mouth/Throat:     Mouth: Mucous membranes are moist.     Dentition: Has dentures. Gingival swelling present. No dental abscesses.     Comments: Receding gum line on left upper mouth where patient is experiencing pain. No appreciated abscess on exam.  Pulmonary:     Effort: Pulmonary effort is normal.  Skin:    General: Skin is warm and dry.     Capillary Refill: Capillary refill takes less than 2 seconds.     Findings: Rash present. No erythema. Rash is not crusting, pustular, scaling or vesicular.     Comments: Flesh colored raised lesions inbetween finger webbing bilateral hands with excoriations. Mostly unchanged from previous exam.   Neurological:     Mental Status: She is alert.      ASSESSMENT/PLAN:   Gingivitis Completed Abx course from ED and seen improvement in abscess. Continues to have gum pain and swelling.  No abscess appreciated on exam today.  Recommended close follow up with dentist  Rash of both hands Not improved with 1% hydrocortisone cream. Discussed  with patient possibility of increasing dose of steroid to further treat for presumed dishydrotic eczema vs trial treatment of scabies. Patient elected to do both. Informed patient to return if not improving to proceed with likely biopsy vs dermatology referral.  - permethrin 5% cream to be used tonight and washed off 8-14 hours later. Can repeat in 1 week if not resolved.  - also sent moderate dose topical steroid to use BID if not resolved with permethrin - return in 1-2 weeks if not resolved to consider biopsy vs dermatology referral     Richarda Osmond, [place]

## 2021-04-01 NOTE — Assessment & Plan Note (Addendum)
Not improved with 1% hydrocortisone cream. Discussed with patient possibility of increasing dose of steroid to further treat for presumed dishydrotic eczema vs trial treatment of scabies. Patient elected to do both. Informed patient to return if not improving to proceed with likely biopsy vs dermatology referral.  - permethrin 5% cream to be used tonight and washed off 8-14 hours later. Can repeat in 1 week if not resolved.  - also sent moderate dose topical steroid to use BID if not resolved with permethrin - return in 1-2 weeks if not resolved to consider biopsy vs dermatology referral

## 2021-04-01 NOTE — Patient Instructions (Signed)
It was a pleasure to see you today!  To summarize our discussion for this visit:  For your hand rash- the appearance is very similar to in a couple of conditions including eczema or scabies. Since the steroid you used previously did not help, we can try a stronger strength for the eczema and also a topical for possible scabies. If it is not improving in the next week, please come back for a biopsy of the skin.  Some additional health maintenance measures we should update are: Health Maintenance Due  Topic Date Due  . COVID-19 Vaccine (4 - Booster for [place] series) 11/16/2020  .    Please return to our clinic to see me as needed.  Call the clinic at [phone] if your symptoms worsen or you have any concerns.   Thank you for allowing me to take part in your care,  Dr. Doristine Mango   Scabies, Adult  Scabies is a skin condition that happens when very small insects called mites get under the skin (infestation). This causes a rash and severe itchiness. Scabies is contagious, which means it can spread from person to person. If you get scabies, it is common for others in your household to get scabies too. With proper treatment, symptoms usually go away in 2-4 weeks. Scabies usually does not cause lasting problems. What are the causes? This condition is caused by tiny mites (Sarcoptes scabiei, or human itch mites) that can only be seen with a microscope. The mites get into the top layer of skin and lay eggs. Scabies can spread from person to person through:  Close contact with a person who has scabies.  Sharing or having contact with infested items, such as towels, bedding, or clothing. What increases the risk? The following factors may make you more likely to develop this condition:  Living in a nursing home or other extended care facility.  Having sexual contact with a partner who has scabies.  Caring for others who are at increased risk for scabies. What are the signs or  symptoms? Symptoms of this condition include:  Severe itchiness. This is often worse at night.  A rash that includes tiny red bumps or blisters. The rash commonly occurs on the hands, wrists, elbows, armpits, chest, waist, groin, or buttocks. The bumps may form a line (burrow) in some areas.  Skin irritation. This can include scaly patches or sores. How is this diagnosed? This condition may be diagnosed based on:  A physical exam of the skin.  A skin test. Your health care provider may take a sample of your affected skin (skin scraping) and have it examined under a microscope for signs of mites. How is this treated? This condition may be treated with:  Medicated cream or lotion that kills the mites. This is spread on the entire body and left on for several hours. Usually, one treatment with medicated cream or lotion is enough to kill all the mites. In severe cases, the treatment may need to be repeated.  Medicated cream that relieves itching.  Medicines taken by mouth (orally) that: ? Relieve itching. ? Reduce the swelling and redness. ? Kill the mites. This treatment may be done in severe cases. Follow these instructions at home: Medicines  Take or apply over-the-counter and prescription medicines only as told by your health care provider.  Apply medicated cream or lotion as told by your health care provider.  Do not wash off the medicated cream or lotion until the necessary amount of time  has passed. Skin care  Avoid scratching the affected areas of your skin.  Keep your fingernails closely trimmed to reduce injury from scratching.  Take cool baths or apply cool washcloths to your skin to help reduce itching. General instructions  Clean all items that you had contact with during the 3 days before diagnosis. This includes bedding, clothing, towels, and furniture. Do this on the same day that you start treatment. ? Dry-clean items, or use hot water to wash items. Dry items  on the hot dry cycle. ? Place items that cannot be washed into closed, airtight plastic bags for at least 3 days. The mites cannot live for more than 3 days away from human skin. ? Vacuum furniture and mattresses that you use.  Make sure that other people who may have been infested are examined by a health care provider. These include members of your household and anyone who may have had contact with infested items.  Keep all follow-up visits. This is important. Where to find more information  Centers for Disease Control and Prevention: http://www.wolf.info/ Contact a health care provider if:  You have itching that does not go away after 4 weeks of treatment.  You continue to develop new bumps or burrows.  You have redness, swelling, or pain in your rash area after treatment.  You have fluid, blood, or pus coming from your rash. Summary  Scabies is a skin condition that causes a rash and severe itchiness.  This condition is caused by tiny mites that get into the top layer of the skin and lay eggs.  Scabies can spread from person to person.  Follow treatments as recommended by your health care provider.  Clean all items that you recently had contact with. This information is not intended to replace advice given to you by your health care provider. Make sure you discuss any questions you have with your health care provider. Document Revised: 02/14/2020 Document Reviewed: 02/14/2020 Elsevier Patient Education  [place].

## 2021-04-01 NOTE — Assessment & Plan Note (Signed)
Completed Abx course from ED and seen improvement in abscess. Continues to have gum pain and swelling.  No abscess appreciated on exam today.  Recommended close follow up with dentist

## 2021-04-02 ENCOUNTER — Encounter (HOSPITAL_COMMUNITY): Payer: Self-pay

## 2021-04-02 ENCOUNTER — Ambulatory Visit (HOSPITAL_COMMUNITY)
Admission: EM | Admit: 2021-04-02 | Discharge: 2021-04-02 | Disposition: A | Discharge status: Home / Self Care | Payer: Medicaid Other | Attending: Emergency Medicine | Admitting: Emergency Medicine

## 2021-04-02 ENCOUNTER — Other Ambulatory Visit: Payer: Self-pay

## 2021-04-02 DIAGNOSIS — N898 Other specified noninflammatory disorders of vagina: Secondary | ICD-10-CM | POA: Insufficient documentation

## 2021-04-02 DIAGNOSIS — K0889 Other specified disorders of teeth and supporting structures: Secondary | ICD-10-CM | POA: Diagnosis not present

## 2021-04-02 MED ORDER — LIDOCAINE VISCOUS HCL 2 % MT SOLN: 15.0000 mL | OROMUCOSAL | 0 refills | Status: DC | PRN

## 2021-04-02 MED ORDER — METRONIDAZOLE 500 MG PO TABS: 500.0000 mg | ORAL_TABLET | Freq: Two times a day (BID) | ORAL | 0 refills | Status: DC

## 2021-04-02 MED ORDER — AMOXICILLIN 500 MG PO CAPS: 500.0000 mg | ORAL_CAPSULE | Freq: Three times a day (TID) | ORAL | 0 refills | Status: AC

## 2021-04-02 MED ORDER — FLUCONAZOLE 150 MG PO TABS: 150.0000 mg | ORAL_TABLET | Freq: Every day | ORAL | 0 refills | Status: AC

## 2021-04-02 NOTE — Discharge Instructions (Addendum)
Take Flagyl twice a day for 7 days  Take Amoxicillin three times a day for 7 days  Take 1 diflucan pill today then if symptoms present in 3 days can take 2nd pill   Can use 600-800 mg ibuprofen every 8 hours as needed for pain  Can use lidocaine 2% solution every 4 hours for pain   Sti screen pending- will be called if positive for treatment  Do not have sex until labs result, if positive do not have sex until treatment complete

## 2021-04-02 NOTE — ED Provider Notes (Signed)
[place]    CSN: [id number] Arrival date & time: 04/02/21  1538      History   Chief Complaint Chief Complaint  Patient presents with  . Vaginal Itching  . Vaginal Discharge  . Oral Swelling    HPI Pam Vargas is a 40 y.o. female.   Patient presents with external and internal vaginal irritation and itching. Thin [place] discharge present. Denies odor, frequency, urgency, abdominal pain, flank pain, lesion, rash, dysuria. Recent BV infection in May, completed medication, symptoms came back within 1 week. Had sex after completion of medication.  Presents with pain and swelling in left upper gum line for 1 week. Denies discharge, numbness, tingling, facial swelling, difficulty swallowing, pain with neck movement, pain with swallowing, ear fullness, headache. Seen for dental pain in beginning of May. Has attempted to get dental appointment but due to insurance availability would not occur until August. Has been trying to get appointment at any dentist office. Has not attempted treatment  Past Medical History:  Diagnosis Date  . Abnormal uterine bleeding (AUB) 03/23/2020  . Advanced maternal age in multigravida   . Alpha thalassemia silent carrier   . Anemia   . Blood transfusion without reported diagnosis    "I think so with my first delivery"  . Cervical cerclage suture present 06/19/2020   06/19/20: ppx mcdonald cerclage with mersilene tape. Knot anteriorly.   . Cervical mass 04/2020  . GBS (group B Streptococcus carrier), +RV culture, currently pregnant 08/31/2020  . Medical history non-contributory    cerclage placed August 20,2021-recieving [name] injections weekly  . Palpitations   . Preterm labor   . PROM (premature rupture of membranes) 08/28/2020  . Recurrent upper respiratory infection (URI)   . Sinus tachycardia   . UTI (urinary tract infection)     Patient Active Problem List   Diagnosis Date Noted  . Gingivitis 04/01/2021  . Rash of both hands  04/01/2021  . Bacterial vaginitis 03/17/2021  . 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 Postpartum care following vaginal delivery 10/13/2020  . SVT (supraventricular tachycardia) ([place])   . Alpha thalassemia silent carrier 06/19/2020  . Single nabothian cyst 06/03/2020  . Subchorionic hemorrhage in first trimester 05/13/2020  . Dry skin 03/23/2020  . Benign cyst of breast 06/05/2017  . History of preterm delivery 09/12/2016  . Previous cesarean section 09/12/2016  . Screen for sexually transmitted diseases 06/03/2011  . TOBACCO USER 11/02/2009    Past Surgical History:  Procedure Laterality Date  . CERVICAL CERCLAGE N/A 06/19/2020   Procedure: CERCLAGE CERVICAL;  Surgeon: Aletha Halim, MD;  Location: [facility];  Service: Gynecology;  Laterality: N/A;  . CESAREAN SECTION    . FOOT SURGERY    . FOOT SURGERY      OB History    Gravida  6   Para  3   Term  1   Preterm  0   AB  2   Living  2     SAB  2   IAB  0   Ectopic  0   Multiple  0   Live Births  2            Home Medications    Prior to Admission medications   Medication Sig Start Date End Date Taking? Authorizing Provider  amoxicillin (AMOXIL) 500 MG capsule Take 1 capsule (500 mg total) by mouth 3 (three) times daily for 7 days. 04/02/21 04/09/21 Yes Aashna Matson R, NP  fluconazole (DIFLUCAN) 150 MG  tablet Take 1 tablet (150 mg total) by mouth daily for 2 doses. 04/02/21 04/04/21 Yes Anie Juniel R, NP  lidocaine (XYLOCAINE) 2 % solution Use as directed 15 mLs in the mouth or throat every 4 (four) hours as needed for mouth pain. 04/02/21  Yes Lateasha Breuer, Leitha Schuller, NP  metoprolol tartrate (LOPRESSOR) 25 MG tablet Take 0.5 tablets (12.5 mg total) by mouth 2 (two) times daily. 11/02/20  Yes Chandrasekhar, Mahesh A, MD  metroNIDAZOLE (FLAGYL) 500 MG tablet Take 1 tablet (500 mg total) by mouth 2 (two) times daily. 04/02/21  Yes Marylynn Rigdon R, NP  permethrin (ELIMITE) 5 % cream Apply to effected area at bedtime  and wash off 8-14 hours later. Can repeat in 1 week if still symptomatic. 04/01/21  Yes Anderson, Chelsey L, DO  Prenatal Vit-Fe Phos-FA-Omega (VITAFOL GUMMIES) 3.33-0.333-34.8 MG CHEW CHEW 1 DOSE BY MOUTH IN THE MORNING, AT NOON, AND AT BEDTIME. 03/15/21  Yes Mullis, Kiersten P, DO  triamcinolone ointment (KENALOG) 0.5 % If rash not cleared with permethrin, use this twice per day to effected area for 1 week. 04/01/21  Yes Anderson, Chelsey L, DO  Blood Pressure Monitoring (BLOOD PRESSURE KIT) DEVI 1 Device by Does not apply route as needed. 05/27/20   Aletha Halim, MD  ferrous sulfate 325 (65 FE) MG tablet Take by mouth. 09/08/20 04/02/21  [provider]    Family History Family History  Problem Relation Age of Onset  . Diabetes Mother   . Hyperlipidemia Mother   . Hypertension Mother   . Heart disease Father   . Hypertension Father   . Breast cancer Maternal Aunt     Social History Social History   Tobacco Use  . Smoking status: Current Every Day Smoker    Types: Cigarettes    Last attempt to quit: 05/29/2020    Years since quitting: 0.8  . Smokeless tobacco: Never Used  . Tobacco comment: 2-3 cigarettes per day  Vaping Use  . Vaping Use: Never used  Substance Use Topics  . Alcohol use: Not Currently    Comment: last drink 04/21/2018  . Drug use: No     Allergies   Patient has no known allergies.   Review of Systems Review of Systems  Defer to HPI    Physical Exam Triage Vital Signs ED Triage Vitals  Enc Vitals Group     BP 04/02/21 1613 (!) 112/54     Pulse Rate 04/02/21 1613 71     Resp 04/02/21 1613 18     Temp 04/02/21 1613 99.1 F (37.3 C)     Temp src --      SpO2 04/02/21 1613 100 %     Weight --      Height --      Head Circumference --      Peak Flow --      Pain Score 04/02/21 1616 7     Pain Loc --      Pain Edu? --      Excl. in [place]? --    No data found.  Updated Vital Signs BP (!) 112/54   Pulse 71   Temp 99.1 F (37.3 C)    Resp 18   LMP 03/20/2021   SpO2 100%   Visual Acuity Right Eye Distance:   Left Eye Distance:   Bilateral Distance:    Right Eye Near:   Left Eye Near:    Bilateral Near:     Physical Exam Constitutional:  Appearance: Normal appearance. She is normal weight.  HENT:     Mouth/Throat:      Comments: Several missing teeth on left upper gum line, 1st molar only tooth present, decay along gumline of tooth with possible small abscess forming, swelling along gumline,  Eyes:     Extraocular Movements: Extraocular movements intact.  Pulmonary:     Effort: Pulmonary effort is normal.  Genitourinary:    Comments: Deferred, self collect vaginal swab Musculoskeletal:        General: Normal range of motion.  Skin:    General: Skin is warm and dry.  Neurological:     Mental Status: She is alert and oriented to person, place, and time. Mental status is at baseline.  Psychiatric:        Mood and Affect: Mood normal.        Behavior: Behavior normal.        Thought Content: Thought content normal.        Judgment: Judgment normal.      UC Treatments / Results  Labs (all labs ordered are listed, but only abnormal results are displayed) Labs Reviewed  CERVICOVAGINAL ANCILLARY ONLY    EKG   Radiology No results found.  Procedures Procedures (including critical care time)  Medications Ordered in UC Medications - No data to display  Initial Impression / Assessment and Plan / UC Course  I have reviewed the triage vital signs and the nursing notes.  Pertinent labs & imaging results that were available during my care of the patient were reviewed by me and considered in my medical decision making (see chart for details).  Dental pain Vaginal irritation  1. Amoxicillin 500 mg tid for 7 days 2. Flagyl 500 mg bid for 7 days 3. Diflucan 150 once then prn in 72 hrs  4. sti screen pending, will treat per protocol, advised abstinence until labs result and/or treatment  complete  5. Lidocaine 2% solution every 4 hours prn 6. Advised otc pain medications 7. Discussed importance of dental visit, encouraged scheduling appointment in August then continuing to look for closer appointment, verbalized understanding  Final Clinical Impressions(s) / UC Diagnoses   Final diagnoses:  Pain, dental  Vaginal irritation     Discharge Instructions     Take Flagyl twice a day for 7 days  Take Amoxicillin three times a day for 7 days  Take 1 diflucan pill today then if symptoms present in 3 days can take 2nd pill   Can use 600-800 mg ibuprofen every 8 hours as needed for pain  Can use lidocaine 2% solution every 4 hours for pain   Sti screen pending- will be called if positive for treatment  Do not have sex until labs result, if positive do not have sex until treatment complete        ED Prescriptions    Medication Sig Dispense Auth. Provider   lidocaine (XYLOCAINE) 2 % solution Use as directed 15 mLs in the mouth or throat every 4 (four) hours as needed for mouth pain. 100 mL Tyrion Glaude R, NP   amoxicillin (AMOXIL) 500 MG capsule Take 1 capsule (500 mg total) by mouth 3 (three) times daily for 7 days. 21 capsule Tamberly Pomplun R, NP   metroNIDAZOLE (FLAGYL) 500 MG tablet Take 1 tablet (500 mg total) by mouth 2 (two) times daily. 14 tablet Darion Milewski R, NP   fluconazole (DIFLUCAN) 150 MG tablet Take 1 tablet (150 mg total) by mouth daily for 2 doses.  2 tablet Hans Eden, NP     PDMP not reviewed this encounter.   Hans Eden, NP 04/02/21 [phone]

## 2021-04-02 NOTE — ED Triage Notes (Signed)
Pt c/o left upper gum pain and swelling for about one week.  Pt also c/o vaginal itching. Pt also c/o some white discharge. Pt reports recently having BV.

## 2021-04-05 LAB — CERVICOVAGINAL ANCILLARY ONLY
Bacterial Vaginitis (gardnerella): NEGATIVE
Candida Glabrata: POSITIVE — AB
Candida Vaginitis: POSITIVE — AB
Chlamydia: NEGATIVE
Comment: NEGATIVE
Comment: NEGATIVE
Comment: NEGATIVE
Comment: NEGATIVE
Comment: NEGATIVE
Comment: NORMAL
Neisseria Gonorrhea: NEGATIVE
Trichomonas: NEGATIVE

## 2021-04-07 ENCOUNTER — Encounter: Payer: Self-pay | Admitting: Student in an Organized Health Care Education/Training Program

## 2021-04-08 ENCOUNTER — Ambulatory Visit: Payer: Medicaid Other | Admitting: Family Medicine

## 2021-04-08 ENCOUNTER — Other Ambulatory Visit: Payer: Self-pay

## 2021-04-08 VITALS — BP 102/62 | HR 85 | Ht 64.0 in | Wt 136.6 lb

## 2021-04-08 DIAGNOSIS — R21 Rash and other nonspecific skin eruption: Secondary | ICD-10-CM | POA: Diagnosis not present

## 2021-04-08 NOTE — Patient Instructions (Signed)
Thank you for coming to see me today. It was a pleasure. Today we talked about:   I have placed a referral to Dermatology for your rash.  If you do not hear from them in the next week, please give [place] a call.   Use permetherin tonight.    Please follow-up with PCP in 2 weeks if not improved.  If you have any questions or concerns, please do not hesitate to call the office at [phone].  Best,   [place] Constable, DO

## 2021-04-08 NOTE — Assessment & Plan Note (Signed)
Does appear that it could be some contact dermatitis given the location, however she has not been wearing any rings in this area and cannot think of anything that would have caused this in this area.  Given that this has been going on for over a month and she has not had improvement with steroid ointment or permethrin, will refer to dermatology.  Did advise patient that if she uses a small amount of Kenalog cream and allows it to dry before picking up her baby, this should not have a large effect on the infant, but would make sure to avoid eyes and mouth.  Also advised to use permethrin tonight as it has been 1 week since her last use.  Referred to dermatology, follow-up with PCP in 2 weeks if does not have an appointment yet.

## 2021-04-08 NOTE — Progress Notes (Signed)
    SUBJECTIVE:   CHIEF COMPLAINT / HPI:   Rash Between Fingers Has been present over a month Originally seen on 4/27 and was diagnosed with Dyshidrotic eczema, prescribed hydrocortisone cream 1% twice daily Seen again on 6/2 given that she had not had improvement She was given permethrin and Kenalog cream to use She states that she has used permethrin once and did not notice improvement She is also been using Kenalog, but frequently washes it off because she is trying to care for her new baby and does not want it to get on the baby She has not had any new exposures to chemicals, detergents, soaps, lotions She also works in housekeeping at [employer], but uses gloves whenever she is using chemicals She does not report any exposure to poison ivy or oak or recent weed pulling The rash is very itchy and distressing to her She does not have this in any other regions She has noticed that it has moved between fingers  PERTINENT  PMH / PSH: History of SVT, tobacco abuse, alpha thalassemia silent carrier  OBJECTIVE:   BP 102/62 (BP Location: Right Arm, Patient Position: Sitting, Cuff Size: Normal)   Pulse 85   Ht 5\' 4"  (1.626 m)   Wt 136 lb 9.6 oz (62 kg)   LMP 03/20/2021   SpO2 99%   BMI 23.45 kg/m    Physical Exam:  General: 40 y.o. female in NAD Lungs: Breathing comfortably on room air Skin: warm and dry, flesh-colored papules with few excoriations between third and fourth digit on right hand Extremities: No edema   ASSESSMENT/PLAN:   Rash of both hands Does appear that it could be some contact dermatitis given the location, however she has not been wearing any rings in this area and cannot think of anything that would have caused this in this area.  Given that this has been going on for over a month and she has not had improvement with steroid ointment or permethrin, will refer to dermatology.  Did advise patient that if she uses a small amount of Kenalog cream and allows it  to dry before picking up her baby, this should not have a large effect on the infant, but would make sure to avoid eyes and mouth.  Also advised to use permethrin tonight as it has been 1 week since her last use.  Referred to dermatology, follow-up with PCP in 2 weeks if does not have an appointment yet.     Cleophas Dunker, [place]

## 2021-04-14 NOTE — Progress Notes (Signed)
Cardiology Office Note:    Date:  04/15/2021   ID:  Pam Vargas, DOB 1981-09-14, MRN [id number]  PCP:  Danna Hefty, DO   Referring MD: Danna Hefty, DO   CC: SVT Follow up  History of Present Illness:    Pam Vargas is a 40 y.o. female with a hx of sinus tachycardia in the setting of pregnancy (seen 08/25/20 while [redacted] weeks pregnant)   Interval history notable for Delivery at [facility], 10/14/20, with SVT on HD#2 at [facility], cardiology was consulted and started pt on metoprolol 12.76m BID. Saw patient 11/02/20 In interim of this visit, patient had no issues on metoprolol.  Seen 04/15/21.  Patient notes that she is doing great..  Since last visit notes no changes; YBetsy Coderis out of NICU and doing well..  Relevant interval testing or therapy include no changes.  There are no interval hospital/ED visit.    No chest pain or pressure.  No SOB/DOE and no PND/Orthopnea.  No significant weight gain or leg swelling.  No palpitations or syncope.   Past Medical History:  Diagnosis Date   Abnormal uterine bleeding (AUB) 03/23/2020   Advanced maternal age in multigravida    Alpha thalassemia silent carrier    Anemia    Blood transfusion without reported diagnosis    "I think so with my first delivery"   Cervical cerclage suture present 06/19/2020   06/19/20: ppx mcdonald cerclage with mersilene tape. Knot anteriorly.    Cervical mass 04/2020   GBS (group B Streptococcus carrier), +RV culture, currently pregnant 08/31/2020   Medical history non-contributory    cerclage placed August 20,2021-recieving [name] injections weekly   Palpitations    Preterm labor    PROM (premature rupture of membranes) 08/28/2020   Recurrent upper respiratory infection (URI)    Sinus tachycardia    UTI (urinary tract infection)     Past Surgical History:  Procedure Laterality Date   CERVICAL CERCLAGE N/A 06/19/2020   Procedure: CERCLAGE CERVICAL;  Surgeon: PAletha Halim MD;  Location: [facility] LD  ORS;  Service: Gynecology;  Laterality: N/A;   CESAREAN SECTION     FOOT SURGERY     FOOT SURGERY     Current Medications: Current Meds  Medication Sig   Blood Pressure Monitoring (BLOOD PRESSURE KIT) DEVI 1 Device by Does not apply route as needed.   lidocaine (XYLOCAINE) 2 % solution Use as directed 15 mLs in the mouth or throat every 4 (four) hours as needed for mouth pain.   metoprolol tartrate (LOPRESSOR) 25 MG tablet Take 0.5 tablets (12.5 mg total) by mouth 2 (two) times daily.   permethrin (ELIMITE) 5 % cream Apply to effected area at bedtime and wash off 8-14 hours later. Can repeat in 1 week if still symptomatic.   Prenatal Vit-Fe Phos-FA-Omega (VITAFOL GUMMIES) 3.33-0.333-34.8 MG CHEW CHEW 1 DOSE BY MOUTH IN THE MORNING, AT NOON, AND AT BEDTIME.   triamcinolone ointment (KENALOG) 0.5 % If rash not cleared with permethrin, use this twice per day to effected area for 1 week.     Allergies:   Patient has no known allergies.   Social History   Socioeconomic History   Marital status: Married    Spouse name: Not on file   Number of children: Not on file   Years of education: Not on file   Highest education level: Not on file  Occupational History   Not on file  Tobacco Use   Smoking status: Every Day  Pack years: 0.00    Types: Cigarettes    Last attempt to quit: 05/29/2020    Years since quitting: 0.8   Smokeless tobacco: Never   Tobacco comments:    2-3 cigarettes per day  Vaping Use   Vaping Use: Never used  Substance and Sexual Activity   Alcohol use: Not Currently    Comment: last drink 04/21/2018   Drug use: No   Sexual activity: Not Currently    Partners: Male    Birth control/protection: None  Other Topics Concern   Not on file  Social History Narrative   Works at [employer] as a Chartered certified accountant    Social Determinants of Radio broadcast assistant Strain: Not on file  Food Insecurity: No Food Insecurity   Worried About Charity fundraiser in the  Last Year: Never true   Arboriculturist in the Last Year: Never true  Transportation Needs: No Transportation Needs   Lack of Transportation (Medical): No   Lack of Transportation (Non-Medical): No  Physical Activity: Not on file  Stress: Not on file  Social Connections: Not on file    Social: Boy's names are Tyreek (22), Oshi (16) and Sincere; who is still in the NICU.  Family History: The patient's family history includes Breast cancer in her maternal aunt; Diabetes in her mother; Heart disease in her father; Hyperlipidemia in her mother; Hypertension in her father and mother. Father and sister had hole in the heart (and have been closed). Father had pacemaker and now has defibrillator. Patient unsure indications for above. No SCD, drowning's or car accidents. No history of ARVC  ROS:   Please see the history of present illness.    All other systems reviewed and are negative.  EKGs/Labs/Other Studies Reviewed:    The following studies were reviewed today:  EKG:   11/03/19: Sinus Rhythm with 1st degree heart block with atypical P wave morphology but no evidence of A Fl. 08/07/20 sinus tach 107 with anterior TWI and borderline LAE  Transthoracic Echocardiogram: Date: 10/14/20 Results: No not see significant inferior hypokinesis on two chamber,  1. Inferior basal hypokinesis . Left ventricular ejection fraction, by  estimation, is 55%. The left ventricle has normal function. The left  ventricle demonstrates regional wall motion abnormalities (see scoring  diagram/findings for description). Left  ventricular diastolic parameters were normal.   2. Right ventricular systolic function is normal. The right ventricular  size is normal.   3. The mitral valve is normal in structure. Mild mitral valve  regurgitation. No evidence of mitral stenosis.   4. The aortic valve is tricuspid. Aortic valve regurgitation is not  visualized. No aortic stenosis is present.   5. The inferior vena  cava is normal in size with greater than 50%  respiratory variability, suggesting right atrial pressure of 3 mmHg.    Recent Labs: 08/07/2020: ALT 13; BUN 9; Creatinine, Ser 0.65; Potassium 3.8; Sodium 138 08/28/2020: Hemoglobin 12.0; Platelets 230  Recent Lipid Panel    Component Value Date/Time   CHOL 124 10/10/2007 2120   TRIG 36 10/10/2007 2120   HDL 57 10/10/2007 2120   CHOLHDL 2.2 Ratio 10/10/2007 2120   VLDL 7 10/10/2007 2120   LDLCALC 60 10/10/2007 2120    Physical Exam:    VS:  BP 106/60   Pulse 69   Ht '5\' 4"'  (1.626 m)   Wt 60.8 kg   LMP 03/20/2021   SpO2 99%   BMI 23.00 kg/m  Wt Readings from Last 3 Encounters:  04/15/21 60.8 kg  04/08/21 62 kg  04/01/21 62.1 kg    GEN:  Well nourished, well developed in no acute distress HEENT: Normal NECK: No JVD; No carotid bruits  LYMPHATICS: No lymphadenopathy CARDIAC: RRR, no murmurs, rubs, gallops RESPIRATORY:  Clear to auscultation without rales, wheezing or rhonchi  ABDOMEN: Soft, non-tender, non-distended MUSCULOSKELETAL:  No edema; No deformity  SKIN: Warm and dry NEUROLOGIC:  Alert and oriented x 3 PSYCHIATRIC:  Normal affect   ASSESSMENT:    No diagnosis found.  PLAN:    In order of problems listed above:  SVT Former Tobacco Use - Discussed the pros and cons of 12.5 mg PO BID  weaning; patient is tolerating well with good control and no adverse sx; we agreed to continue metoprolol 12.5 mg PO BID  Two years follow up unless new symptoms or abnormal test results warranting change in plan  Would be reasonable for Video Visit Follow up Would be reasonable for  APP Follow up     Medication Adjustments/Labs and Tests Ordered: Current medicines are reviewed at length with the patient today.  Concerns regarding medicines are outlined above.  No orders of the defined types were placed in this encounter.  No orders of the defined types were placed in this encounter.   There are no Patient  Instructions on file for this visit.   Signed, Werner Lean, MD  04/15/2021 8:16 AM    [place]

## 2021-04-15 ENCOUNTER — Other Ambulatory Visit: Payer: Self-pay

## 2021-04-15 ENCOUNTER — Encounter: Payer: Self-pay | Admitting: Internal Medicine

## 2021-04-15 ENCOUNTER — Ambulatory Visit (INDEPENDENT_AMBULATORY_CARE_PROVIDER_SITE_OTHER): Payer: Medicaid Other | Admitting: Internal Medicine

## 2021-04-15 VITALS — BP 106/60 | HR 69 | Ht 64.0 in | Wt 134.0 lb

## 2021-04-15 DIAGNOSIS — I471 Supraventricular tachycardia: Secondary | ICD-10-CM

## 2021-04-15 NOTE — Patient Instructions (Signed)
Medication Instructions:  Your physician recommends that you continue on your current medications as directed. Please refer to the Current Medication list given to you today.  CONTINUE to take metoprolol   *If you need a refill on your cardiac medications before your next appointment, please call your pharmacy*   Lab Work: NONE If you have labs (blood work) drawn today and your tests are completely normal, you will receive your results only by: [place] (if you have MyChart) OR A paper copy in the mail If you have any lab test that is abnormal or we need to change your treatment, we will call you to review the results.   Testing/Procedures: NONE   Follow-Up: At [facility], you and your health needs are our priority.  As part of our continuing mission to provide you with exceptional heart care, we have created designated Provider Care Teams.  These Care Teams include your primary Cardiologist (physician) and Advanced Practice Providers (APPs -  Physician Assistants and Nurse Practitioners) who all work together to provide you with the care you need, when you need it.  We recommend signing up for the patient portal called "MyChart".  Sign up information is provided on this After Visit Summary.  MyChart is used to connect with patients for Virtual Visits (Telemedicine).  Patients are able to view lab/test results, encounter notes, upcoming appointments, etc.  Non-urgent messages can be sent to your provider as well.   To learn more about what you can do with MyChart, go to [email or web address].    Your next appointment:   24 month(s)  The format for your next appointment:   In Person  Provider:   You may see Rudean Haskell, MD or one of the following Advanced Practice Providers on your designated Care Team:   Melina Copa, PA-C Ermalinda Barrios, PA-C

## 2021-04-29 ENCOUNTER — Encounter (HOSPITAL_COMMUNITY): Payer: Self-pay

## 2021-04-29 ENCOUNTER — Ambulatory Visit (HOSPITAL_COMMUNITY)
Admission: EM | Admit: 2021-04-29 | Discharge: 2021-04-29 | Disposition: A | Discharge status: Home / Self Care | Payer: Medicaid Other | Attending: Emergency Medicine | Admitting: Emergency Medicine

## 2021-04-29 ENCOUNTER — Other Ambulatory Visit: Payer: Self-pay

## 2021-04-29 DIAGNOSIS — K047 Periapical abscess without sinus: Secondary | ICD-10-CM | POA: Diagnosis not present

## 2021-04-29 MED ORDER — LIDOCAINE VISCOUS HCL 2 % MT SOLN: 15.0000 mL | OROMUCOSAL | 0 refills | Status: DC | PRN

## 2021-04-29 MED ORDER — AMOXICILLIN-POT CLAVULANATE 875-125 MG PO TABS: 1.0000 | ORAL_TABLET | Freq: Two times a day (BID) | ORAL | 0 refills | Status: DC

## 2021-04-29 NOTE — ED Provider Notes (Signed)
[facility]    CSN: [id number] Arrival date & time: 04/29/21  0935      History   Chief Complaint Chief Complaint  Patient presents with   Facial Swelling   Dental Pain    HPI Pam Vargas is a 40 y.o. female.   Patient here for evaluation of right upper gum pain and swelling that started on Tuesday.  Also reports noticing some pus and drainage.  Reports pain worse last night and worse when trying to eat.  Reports attempting to get an appointment with a dentist but is unable to get in until August.  Has not taken any OTC medications or treatments.  Denies any trauma, injury, or other precipitating event. Denies any fevers, chest pain, shortness of breath, N/V/D, numbness, tingling, weakness, abdominal pain, or headaches.     The history is provided by the patient.  Dental Pain  Past Medical History:  Diagnosis Date   Abnormal uterine bleeding (AUB) 03/23/2020   Advanced maternal age in multigravida    Alpha thalassemia silent carrier    Anemia    Blood transfusion without reported diagnosis    "I think so with my first delivery"   Cervical cerclage suture present 06/19/2020   06/19/20: ppx mcdonald cerclage with mersilene tape. Knot anteriorly.    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   SVT (supraventricular tachycardia) ([place])    Alpha thalassemia silent carrier 06/19/2020   Single nabothian cyst 06/03/2020   Subchorionic hemorrhage  in first trimester 05/13/2020   Dry skin 03/23/2020   Benign cyst of breast 06/05/2017   History of preterm delivery 09/12/2016   Previous cesarean section 09/12/2016   Screen for sexually transmitted diseases 06/03/2011   TOBACCO USER 11/02/2009    Past Surgical History:  Procedure Laterality Date   CERVICAL CERCLAGE N/A 06/19/2020   Procedure: CERCLAGE CERVICAL;  Surgeon: Aletha Halim, MD;  Location: [facility];  Service: Gynecology;  Laterality: N/A;   CESAREAN SECTION     FOOT SURGERY     FOOT SURGERY      OB History     Gravida  6   Para  3   Term  1   Preterm  0   AB  2   Living  2      SAB  2   IAB  0   Ectopic  0   Multiple  0   Live Births  2            Home Medications    Prior to Admission medications   Medication Sig Start Date End Date Taking? Authorizing Provider  amoxicillin-clavulanate (AUGMENTIN) 875-125 MG tablet Take 1 tablet by mouth every 12 (twelve) hours. 04/29/21  Yes Pearson Forster, NP  lidocaine (XYLOCAINE) 2 % solution Use as directed 15 mLs in the mouth or throat as needed for mouth pain. 04/29/21  Yes Pearson Forster, NP  Blood Pressure Monitoring (BLOOD PRESSURE  KIT) DEVI 1 Device by Does not apply route as needed. 05/27/20   Aletha Halim, MD  metoprolol tartrate (LOPRESSOR) 25 MG tablet Take 0.5 tablets (12.5 mg total) by mouth 2 (two) times daily. 11/02/20   Chandrasekhar, Terisa Starr, MD  permethrin (ELIMITE) 5 % cream Apply to effected area at bedtime and wash off 8-14 hours later. Can repeat in 1 week if still symptomatic. 04/01/21   Anderson, Chelsey L, DO  Prenatal Vit-Fe Phos-FA-Omega (VITAFOL GUMMIES) 3.33-0.333-34.8 MG CHEW CHEW 1 DOSE BY MOUTH IN THE MORNING, AT NOON, AND AT BEDTIME. 03/15/21   Mullis, Kiersten P, DO  triamcinolone ointment (KENALOG) 0.5 % If rash not cleared with permethrin, use this twice per day to effected area for 1 week. 04/01/21   Richarda Osmond, DO    Family History Family History   Problem Relation Age of Onset   Diabetes Mother    Hyperlipidemia Mother    Hypertension Mother    Heart disease Father    Hypertension Father    Breast cancer Maternal Aunt     Social History Social History   Tobacco Use   Smoking status: Every Day    Pack years: 0.00    Types: Cigarettes    Last attempt to quit: 05/29/2020    Years since quitting: 0.9   Smokeless tobacco: Never   Tobacco comments:    2-3 cigarettes per day  Vaping Use   Vaping Use: Never used  Substance Use Topics   Alcohol use: Not Currently    Comment: last drink 04/21/2018   Drug use: No     Allergies   Patient has no known allergies.   Review of Systems Review of Systems  HENT:  Positive for dental problem.   All other systems reviewed and are negative.   Physical Exam Triage Vital Signs ED Triage Vitals  Enc Vitals Group     BP 04/29/21 0957 (!) 102/57     Pulse Rate 04/29/21 0957 74     Resp 04/29/21 0957 (!) 22     Temp 04/29/21 0957 98.9 F (37.2 C)     Temp Source 04/29/21 0957 Oral     SpO2 04/29/21 0957 100 %     Weight --      Height --      Head Circumference --      Peak Flow --      Pain Score 04/29/21 0955 8     Pain Loc --      Pain Edu? --      Excl. in [place]? --    No data found.  Updated Vital Signs BP (!) 102/57 (BP Location: Right Arm)   Pulse 74   Temp 98.9 F (37.2 C) (Oral)   Resp (!) 22   LMP 04/14/2021   SpO2 100%   Visual Acuity Right Eye Distance:   Left Eye Distance:   Bilateral Distance:    Right Eye Near:   Left Eye Near:    Bilateral Near:     Physical Exam Vitals and nursing note reviewed.  Constitutional:      General: She is not in acute distress.    Appearance: Normal appearance. She is not ill-appearing, toxic-appearing or diaphoretic.  HENT:     Head: Normocephalic and atraumatic.     Mouth/Throat:     Dentition: Abnormal dentition. Gingival swelling, dental abscesses and gum lesions present.  Eyes:     Conjunctiva/sclera:  Conjunctivae normal.  Cardiovascular:     Rate  and Rhythm: Normal rate.     Pulses: Normal pulses.  Pulmonary:     Effort: Pulmonary effort is normal.  Abdominal:     General: Abdomen is flat.  Musculoskeletal:        General: Normal range of motion.     Cervical back: Normal range of motion.  Skin:    General: Skin is warm and dry.  Neurological:     General: No focal deficit present.     Mental Status: She is alert and oriented to person, place, and time.  Psychiatric:        Mood and Affect: Mood normal.     UC Treatments / Results  Labs (all labs ordered are listed, but only abnormal results are displayed) Labs Reviewed - No data to display  EKG   Radiology No results found.  Procedures Procedures (including critical care time)  Medications Ordered in UC Medications - No data to display  Initial Impression / Assessment and Plan / UC Course  I have reviewed the triage vital signs and the nursing notes.  Pertinent labs & imaging results that were available during my care of the patient were reviewed by me and considered in my medical decision making (see chart for details).    Assessment negative for red flags or concerns.  This is a dental abscess that we will treat with Augmentin twice daily for 7 days.  Also prescribed lidocaine as needed for pain.  May take Tylenol and/or ibuprofen for pain and fevers.  Recommend following up with a dentist as soon as possible and given dental resource sheet.  Return or go to the emergency room for any worsening symptoms. Final Clinical Impressions(s) / UC Diagnoses   Final diagnoses:  Dental abscess     Discharge Instructions      Take the Augmentin twice a day for the next 7 days.  You can apply the lidocaine to your gum as needed for pain.  You can take Tylenol and/or ibuprofen as needed for pain relief and fever reduction.  Follow-up with the dentist as soon as possible.  Return or go to the Emergency Department  if symptoms worsen or do not improve in the next few days.      ED Prescriptions     Medication Sig Dispense Auth. Provider   amoxicillin-clavulanate (AUGMENTIN) 875-125 MG tablet Take 1 tablet by mouth every 12 (twelve) hours. 14 tablet Caroll Rancher R, NP   lidocaine (XYLOCAINE) 2 % solution Use as directed 15 mLs in the mouth or throat as needed for mouth pain. 100 mL Pearson Forster, NP      PDMP not reviewed this encounter.   Pearson Forster, NP 04/29/21 1021

## 2021-04-29 NOTE — ED Triage Notes (Signed)
Pt c/o facial swelling, gums being swollen and hole in gum to left side of face. Patient states it hurts when she tries to eat on the left side of mouth.   Symptoms started Tuesdays but got worse last night.  Patient has not taken any interventions.

## 2021-04-29 NOTE — Discharge Instructions (Addendum)
Take the Augmentin twice a day for the next 7 days.  You can apply the lidocaine to your gum as needed for pain.  You can take Tylenol and/or ibuprofen as needed for pain relief and fever reduction.  Follow-up with the dentist as soon as possible.  Return or go to the Emergency Department if symptoms worsen or do not improve in the next few days.

## 2021-05-08 ENCOUNTER — Other Ambulatory Visit: Payer: Self-pay

## 2021-05-08 ENCOUNTER — Other Ambulatory Visit: Payer: Self-pay | Admitting: Obstetrics and Gynecology

## 2021-05-13 ENCOUNTER — Telehealth: Payer: Self-pay

## 2021-05-14 NOTE — Telephone Encounter (Signed)
Patient states that her back has been giving her problems and that is the reason for the request for Meloxicam and Ibuprofen.  Patient was not near her calendar and will call back later to schedule an appointment with PCP.  Pam Vargas, [place]

## 2021-06-03 ENCOUNTER — Other Ambulatory Visit: Payer: Self-pay

## 2021-06-03 ENCOUNTER — Encounter (HOSPITAL_COMMUNITY): Payer: Self-pay

## 2021-06-03 ENCOUNTER — Ambulatory Visit (HOSPITAL_COMMUNITY)
Admission: EM | Admit: 2021-06-03 | Discharge: 2021-06-03 | Disposition: A | Discharge status: Home / Self Care | Payer: Medicaid Other | Attending: Emergency Medicine | Admitting: Emergency Medicine

## 2021-06-03 DIAGNOSIS — N898 Other specified noninflammatory disorders of vagina: Secondary | ICD-10-CM | POA: Insufficient documentation

## 2021-06-03 MED ORDER — FLUCONAZOLE 150 MG PO TABS: 150.0000 mg | ORAL_TABLET | Freq: Every day | ORAL | 0 refills | Status: DC

## 2021-06-03 NOTE — ED Provider Notes (Signed)
[facility]    CSN: [id number] Arrival date & time: 06/03/21  1013      History   Chief Complaint Chief Complaint  Patient presents with   Vaginal Itching   vaginal odor    HPI Pam Vargas is a 40 y.o. female.   Patient presents with vaginal irritation and odor for 1 week.  Denies discharge, frequency, urgency, abdominal pain or pressure, flank pain, back pain, dysuria, hematuria.  History of reoccurring yeast and BV infections.  Sexually active, 1 partner, no condom use.  Past Medical History:  Diagnosis Date   Abnormal uterine bleeding (AUB) 03/23/2020   Advanced maternal age in multigravida    Alpha thalassemia silent carrier    Anemia    Blood transfusion without reported diagnosis    "I think so with my first delivery"   Cervical cerclage suture present 06/19/2020   06/19/20: ppx mcdonald cerclage with mersilene tape. Knot anteriorly.    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   SVT (supraventricular tachycardia) ([place])    Alpha thalassemia silent carrier 06/19/2020   Single nabothian cyst 06/03/2020   Subchorionic hemorrhage in first trimester 05/13/2020   Dry skin 03/23/2020   Benign cyst of breast 06/05/2017   History of preterm delivery 09/12/2016   Previous cesarean section 09/12/2016   Screen for sexually transmitted diseases 06/03/2011   TOBACCO USER 11/02/2009    Past Surgical History:  Procedure Laterality Date    CERVICAL CERCLAGE N/A 06/19/2020   Procedure: CERCLAGE CERVICAL;  Surgeon: Aletha Halim, MD;  Location: [facility];  Service: Gynecology;  Laterality: N/A;   CESAREAN SECTION     FOOT SURGERY     FOOT SURGERY      OB History     Gravida  6   Para  3   Term  1   Preterm  0   AB  2   Living  2      SAB  2   IAB  0   Ectopic  0   Multiple  0   Live Births  2            Home Medications    Prior to Admission medications   Medication Sig Start Date End Date Taking? Authorizing Provider  fluconazole (DIFLUCAN) 150 MG tablet Take 1 tablet (150 mg total) by mouth daily. 06/03/21  Yes Aribella Vavra, Leitha Schuller, NP  amoxicillin-clavulanate (AUGMENTIN) 875-125 MG tablet Take 1 tablet by mouth every 12 (twelve) hours. 04/29/21   Pearson Forster, NP  Blood Pressure Monitoring (BLOOD PRESSURE KIT) DEVI 1 Device by Does not apply route as needed. 05/27/20   Aletha Halim, MD  lidocaine (XYLOCAINE) 2 % solution Use as directed 15 mLs in the mouth or throat as needed for mouth pain. 04/29/21   Pearson Forster, NP  metoprolol tartrate (LOPRESSOR) 25 MG tablet Take 0.5 tablets (12.5 mg total) by mouth 2 (two) times daily. 11/02/20  Chandrasekhar, Mahesh A, MD  permethrin (ELIMITE) 5 % cream Apply to effected area at bedtime and wash off 8-14 hours later. Can repeat in 1 week if still symptomatic. 04/01/21   Anderson, Chelsey L, DO  Prenatal Vit-Fe Phos-FA-Omega (VITAFOL GUMMIES) 3.33-0.333-34.8 MG CHEW CHEW 1 DOSE BY MOUTH IN THE MORNING, AT NOON, AND AT BEDTIME. 03/15/21   Mullis, Kiersten P, DO  triamcinolone ointment (KENALOG) 0.5 % If rash not cleared with permethrin, use this twice per day to effected area for 1 week. 04/01/21   Richarda Osmond, DO    Family History Family History  Problem Relation Age of Onset   Diabetes Mother    Hyperlipidemia Mother    Hypertension Mother    Heart disease Father    Hypertension Father    Breast cancer Maternal Aunt     Social  History Social History   Tobacco Use   Smoking status: Every Day    Types: Cigarettes    Last attempt to quit: 05/29/2020    Years since quitting: 1.0   Smokeless tobacco: Never   Tobacco comments:    2-3 cigarettes per day  Vaping Use   Vaping Use: Never used  Substance Use Topics   Alcohol use: Not Currently    Comment: last drink 04/21/2018   Drug use: No     Allergies   Patient has no known allergies.   Review of Systems Review of Systems  Constitutional: Negative.   Respiratory: Negative.    Cardiovascular: Negative.   Genitourinary:  Negative for decreased urine volume, difficulty urinating, dyspareunia, dysuria, enuresis, flank pain, frequency, genital sores, hematuria, menstrual problem, pelvic pain, urgency, vaginal bleeding, vaginal discharge and vaginal pain.  Musculoskeletal: Negative.   Skin: Negative.     Physical Exam Triage Vital Signs ED Triage Vitals  Enc Vitals Group     BP 06/03/21 1129 96/68     Pulse Rate 06/03/21 1129 64     Resp 06/03/21 1129 18     Temp 06/03/21 1129 98.4 F (36.9 C)     Temp Source 06/03/21 1129 Oral     SpO2 06/03/21 1129 98 %     Weight --      Height --      Head Circumference --      Peak Flow --      Pain Score 06/03/21 1128 3     Pain Loc --      Pain Edu? --      Excl. in [place]? --    No data found.  Updated Vital Signs BP 96/68 (BP Location: Right Arm)   Pulse 64   Temp 98.4 F (36.9 C) (Oral)   Resp 18   LMP 05/14/2021 (Approximate)   SpO2 98%   Visual Acuity Right Eye Distance:   Left Eye Distance:   Bilateral Distance:    Right Eye Near:   Left Eye Near:    Bilateral Near:     Physical Exam Constitutional:      Appearance: Normal appearance. She is normal weight.  Eyes:     Extraocular Movements: Extraocular movements intact.  Pulmonary:     Effort: Pulmonary effort is normal.  Genitourinary:    Comments: Deferred self collected vaginal swab Skin:    General: Skin is warm and dry.   Neurological:     Mental Status: She is alert and oriented to person, place, and time. Mental status is at baseline.  Psychiatric:        Mood  and Affect: Mood normal.        Behavior: Behavior normal.     UC Treatments / Results  Labs (all labs ordered are listed, but only abnormal results are displayed) Labs Reviewed  CERVICOVAGINAL ANCILLARY ONLY    EKG   Radiology No results found.  Procedures Procedures (including critical care time)  Medications Ordered in UC Medications - No data to display  Initial Impression / Assessment and Plan / UC Course  I have reviewed the triage vital signs and the nursing notes.  Pertinent labs & imaging results that were available during my care of the patient were reviewed by me and considered in my medical decision making (see chart for details).  Vaginal irritation  1.  Diflucan 150 mg once, 150 mg in 72 hours prn 2.  STI screening.,  Will treat per protocol, advised abstinence until labs results and/or symptoms resolve Final Clinical Impressions(s) / UC Diagnoses   Final diagnoses:  Vaginal irritation     Discharge Instructions      Take 1 Diflucan tablet today then if having symptoms in 3 days can take second tablet  Swab pending 2 to 3 days, you will be called if positive for any infection and treatment will be sent to pharmacy  Please refrain from sex until symptoms resolve and/or treatment is complete  May follow-up at urgent care or with primary doctor if symptoms are persistent   ED Prescriptions     Medication Sig Dispense Auth. Provider   fluconazole (DIFLUCAN) 150 MG tablet Take 1 tablet (150 mg total) by mouth daily. 2 tablet Hans Eden, NP      PDMP not reviewed this encounter.   Hans Eden, NP 06/03/21 1148

## 2021-06-03 NOTE — Discharge Instructions (Addendum)
Take 1 Diflucan tablet today then if having symptoms in 3 days can take second tablet  Swab pending 2 to 3 days, you will be called if positive for any infection and treatment will be sent to pharmacy  Please refrain from sex until symptoms resolve and/or treatment is complete  May follow-up at urgent care or with primary doctor if symptoms are persistent

## 2021-06-03 NOTE — ED Triage Notes (Signed)
Pt in with c/o vaginal itching and odor x 1 week  States she works outside and sweats a lot

## 2021-06-04 LAB — CERVICOVAGINAL ANCILLARY ONLY
Bacterial Vaginitis (gardnerella): POSITIVE — AB
Candida Glabrata: NEGATIVE
Candida Vaginitis: NEGATIVE
Chlamydia: NEGATIVE
Comment: NEGATIVE
Comment: NEGATIVE
Comment: NEGATIVE
Comment: NEGATIVE
Comment: NEGATIVE
Comment: NORMAL
Neisseria Gonorrhea: NEGATIVE
Trichomonas: NEGATIVE

## 2021-06-07 ENCOUNTER — Telehealth: Payer: Self-pay | Admitting: Emergency Medicine

## 2021-06-07 MED ORDER — METRONIDAZOLE 500 MG PO TABS
500.0000 mg | ORAL_TABLET | Freq: Two times a day (BID) | ORAL | 0 refills | Status: DC
Start: 2021-06-07 — End: 2022-01-13

## 2021-07-11 ENCOUNTER — Other Ambulatory Visit: Payer: Self-pay

## 2021-07-11 ENCOUNTER — Emergency Department (HOSPITAL_COMMUNITY)
Admission: EM | Admit: 2021-07-11 | Discharge: 2021-07-11 | Disposition: A | Discharge status: Home / Self Care | Payer: Medicaid Other | Attending: Emergency Medicine | Admitting: Emergency Medicine

## 2021-07-11 ENCOUNTER — Emergency Department (HOSPITAL_COMMUNITY): Payer: Medicaid Other

## 2021-07-11 DIAGNOSIS — F17210 Nicotine dependence, cigarettes, uncomplicated: Secondary | ICD-10-CM | POA: Insufficient documentation

## 2021-07-11 DIAGNOSIS — W2209XA Striking against other stationary object, initial encounter: Secondary | ICD-10-CM | POA: Insufficient documentation

## 2021-07-11 DIAGNOSIS — S99921A Unspecified injury of right foot, initial encounter: Secondary | ICD-10-CM | POA: Diagnosis present

## 2021-07-11 DIAGNOSIS — S92344A Nondisplaced fracture of fourth metatarsal bone, right foot, initial encounter for closed fracture: Secondary | ICD-10-CM | POA: Diagnosis not present

## 2021-07-11 DIAGNOSIS — S92514A Nondisplaced fracture of proximal phalanx of right lesser toe(s), initial encounter for closed fracture: Secondary | ICD-10-CM | POA: Insufficient documentation

## 2021-07-11 DIAGNOSIS — Y9302 Activity, running: Secondary | ICD-10-CM | POA: Diagnosis not present

## 2021-07-11 NOTE — ED Notes (Signed)
Cms remains intact after post op shoe being placed

## 2021-07-11 NOTE — ED Notes (Signed)
Patient denies pain and is resting comfortably.  

## 2021-07-11 NOTE — ED Provider Notes (Signed)
[facility] DEPT Provider Note   CSN: [id number] Arrival date & time: 07/11/21  4967     History Chief Complaint  Patient presents with   Toe Injury    Pam Vargas is a 40 y.o. female who presents the emergency department today for further evaluation of right third fourth and fifth toe pain that began last night.  She states that she was running to comfort her crying infant when she struck her toes against the door frame.  Her pain has been constant and worse with ambulation.  She rates her pain moderate severity.  Tylenol offered little improvement.  She denies any other injuries and weakness/numbness to the foot.  HPI     Past Medical History:  Diagnosis Date   Abnormal uterine bleeding (AUB) 03/23/2020   Advanced maternal age in multigravida    Alpha thalassemia silent carrier    Anemia    Blood transfusion without reported diagnosis    "I think so with my first delivery"   Cervical cerclage suture present 06/19/2020   06/19/20: ppx mcdonald cerclage with mersilene tape. Knot anteriorly.    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   SVT (supraventricular tachycardia) ([place])    Alpha thalassemia silent carrier 06/19/2020   Single nabothian cyst 06/03/2020   Subchorionic hemorrhage in first trimester 05/13/2020   Dry skin 03/23/2020   Benign cyst of breast 06/05/2017   History of preterm delivery 09/12/2016   Previous  cesarean section 09/12/2016   Screen for sexually transmitted diseases 06/03/2011   TOBACCO USER 11/02/2009    Past Surgical History:  Procedure Laterality Date   CERVICAL CERCLAGE N/A 06/19/2020   Procedure: CERCLAGE CERVICAL;  Surgeon: Aletha Halim, MD;  Location: [facility];  Service: Gynecology;  Laterality: N/A;   CESAREAN SECTION     FOOT SURGERY     FOOT SURGERY       OB History     Gravida  6   Para  3   Term  1   Preterm  0   AB  2   Living  2      SAB  2   IAB  0   Ectopic  0   Multiple  0   Live Births  2           Family History  Problem Relation Age of Onset   Diabetes Mother    Hyperlipidemia Mother    Hypertension Mother    Heart disease Father    Hypertension Father    Breast cancer Maternal Aunt     Social History   Tobacco Use   Smoking status: Every Day    Types: Cigarettes    Last attempt to quit: 05/29/2020    Years since quitting: 1.1   Smokeless tobacco: Never   Tobacco comments:    2-3 cigarettes per day  Vaping Use   Vaping Use: Never used  Substance Use Topics  Alcohol use: Not Currently    Comment: last drink 04/21/2018   Drug use: No    Home Medications Prior to Admission medications   Medication Sig Start Date End Date Taking? Authorizing Provider  amoxicillin-clavulanate (AUGMENTIN) 875-125 MG tablet Take 1 tablet by mouth every 12 (twelve) hours. 04/29/21   Pearson Forster, NP  Blood Pressure Monitoring (BLOOD PRESSURE KIT) DEVI 1 Device by Does not apply route as needed. 05/27/20   Aletha Halim, MD  fluconazole (DIFLUCAN) 150 MG tablet Take 1 tablet (150 mg total) by mouth daily. 06/03/21   White, Leitha Schuller, NP  lidocaine (XYLOCAINE) 2 % solution Use as directed 15 mLs in the mouth or throat as needed for mouth pain. 04/29/21   Pearson Forster, NP  metoprolol tartrate (LOPRESSOR) 25 MG tablet Take 0.5 tablets (12.5 mg total) by mouth 2 (two) times daily. 11/02/20   Chandrasekhar, Terisa Starr, MD   metroNIDAZOLE (FLAGYL) 500 MG tablet Take 1 tablet (500 mg total) by mouth 2 (two) times daily. 06/07/21   Chase Picket, MD  permethrin (ELIMITE) 5 % cream Apply to effected area at bedtime and wash off 8-14 hours later. Can repeat in 1 week if still symptomatic. 04/01/21   Richarda Osmond, MD  Prenatal Vit-Fe Phos-FA-Omega (VITAFOL GUMMIES) 3.33-0.333-34.8 MG CHEW CHEW 1 DOSE BY MOUTH IN THE MORNING, AT NOON, AND AT BEDTIME. 03/15/21   Mullis, Kiersten P, DO  triamcinolone ointment (KENALOG) 0.5 % If rash not cleared with permethrin, use this twice per day to effected area for 1 week. 04/01/21   Richarda Osmond, MD    Allergies    Patient has no known allergies.  Review of Systems   Review of Systems  All other systems reviewed and are negative.  Physical Exam Updated Vital Signs BP (!) 107/54   Pulse 84   Temp 97.9 F (36.6 C)   Resp 19   LMP 07/04/2021   SpO2 99%   Physical Exam Vitals reviewed.  Constitutional:      Appearance: Normal appearance.  HENT:     Head: Normocephalic and atraumatic.  Eyes:     General:        Right eye: No discharge.        Left eye: No discharge.     Conjunctiva/sclera: Conjunctivae normal.  Pulmonary:     Effort: Pulmonary effort is normal.  Musculoskeletal:     Comments: Tenderness to palpation of the 3rd, 4th, and 5th metatarsals. She is neurovascularly in the right foot. Normal range of motion.   Skin:    General: Skin is warm and dry.     Capillary Refill: Capillary refill takes less than 2 seconds.     Findings: No rash.  Neurological:     General: No focal deficit present.     Mental Status: She is alert.  Psychiatric:        Mood and Affect: Mood normal.        Behavior: Behavior normal.    ED Results / Procedures / Treatments   Labs (all labs ordered are listed, but only abnormal results are displayed) Labs Reviewed - No data to display  EKG None  Radiology DG Foot Complete Right  Result Date:  07/11/2021 CLINICAL DATA:  Trauma of the third through fifth right toe with pain. EXAM: RIGHT FOOT COMPLETE - 3+ VIEW COMPARISON:  None. FINDINGS: There is nondisplaced fracture at the base of the fourth proximal phalanx. No other acute fracture or dislocation is  identified. Patient status post prior fixation of the first metatarsal. IMPRESSION: Nondisplaced fracture at the base of the fourth proximal phalanx. Electronically Signed   By: Abelardo Diesel M.D.   On: 07/11/2021 10:02    Procedures Procedures   Medications Ordered in ED Medications - No data to display  ED Course  I have reviewed the triage vital signs and the nursing notes.  Pertinent labs & imaging results that were available during my care of the patient were reviewed by me and considered in my medical decision making (see chart for details).    MDM Rules/Calculators/A&P                          Pam Vargas is a 40 y.o. female who presents the emergency department today for further evaluation of right toe pain after an injury that occurred last night.  Patient is neurovascularly intact and compartments are soft on physical exam.  I have a low suspicion for compartment syndrome at this time.  There are no obvious open fractures during my physical examination.  Imaging reveals a nondisplaced fracture at the base of the fourth proximal phalanx.  I will place the patient in a postop shoe, will provide her instructions for RICE therapy, and will have her follow-up with orthopedics for further evaluation.  She is stable for discharge.  Final Clinical Impression(s) / ED Diagnoses Final diagnoses:  Closed nondisplaced fracture of proximal phalanx of lesser toe of right foot, initial encounter    Rx / DC Orders ED Discharge Orders     None        Hendricks Limes, [place] 07/11/21 1012    Regan Lemming, MD 07/11/21 1028

## 2021-07-11 NOTE — ED Triage Notes (Signed)
Pt states she struck three of her toes on her right foot last night on a dresser.

## 2021-07-11 NOTE — Discharge Instructions (Addendum)
You were seen and evaluated in the emergency department today for right toe pain.  As we discussed, you have a nondisplaced fracture of the fourth right toe.  Tylenol/ibuprofen for pain.  Please read the RICE therapy handout for additional fracture care.  Please follow-up with orthopedics in the next week.  Please return to the emergency department for worsening pain, swelling, new weakness/numbness of the right foot, significant changes in color of the foot, or any other concerns you may have.

## 2021-07-12 ENCOUNTER — Emergency Department (HOSPITAL_COMMUNITY)
Admission: EM | Admit: 2021-07-12 | Discharge: 2021-07-12 | Disposition: A | Discharge status: Home / Self Care | Payer: Medicaid Other | Attending: Emergency Medicine | Admitting: Emergency Medicine

## 2021-07-12 ENCOUNTER — Other Ambulatory Visit: Payer: Self-pay

## 2021-07-12 ENCOUNTER — Encounter (HOSPITAL_COMMUNITY): Payer: Self-pay

## 2021-07-12 ENCOUNTER — Telehealth: Payer: Self-pay

## 2021-07-12 DIAGNOSIS — S92514D Nondisplaced fracture of proximal phalanx of right lesser toe(s), subsequent encounter for fracture with routine healing: Secondary | ICD-10-CM | POA: Diagnosis not present

## 2021-07-12 DIAGNOSIS — S99921D Unspecified injury of right foot, subsequent encounter: Secondary | ICD-10-CM | POA: Diagnosis present

## 2021-07-12 DIAGNOSIS — F17210 Nicotine dependence, cigarettes, uncomplicated: Secondary | ICD-10-CM | POA: Insufficient documentation

## 2021-07-12 DIAGNOSIS — W2209XD Striking against other stationary object, subsequent encounter: Secondary | ICD-10-CM | POA: Diagnosis not present

## 2021-07-12 DIAGNOSIS — S92511A Displaced fracture of proximal phalanx of right lesser toe(s), initial encounter for closed fracture: Secondary | ICD-10-CM | POA: Diagnosis not present

## 2021-07-12 MED ORDER — HYDROCODONE-ACETAMINOPHEN 5-325 MG PO TABS: 1.0000 | ORAL_TABLET | Freq: Four times a day (QID) | ORAL | 0 refills | Status: DC | PRN

## 2021-07-12 NOTE — Discharge Instructions (Addendum)
Take ibuprofen 3 times a day with meals.  Take 4 tablets (800 mg) at a tome. Do not take other anti-inflammatories at the same time (Advil, Motrin, naproxen, Aleve). You may supplement with Tylenol if you need further pain control. Use norco as needed for severe or breakthrough pain. Have caution, this may make you tired or groggy. Do not drive or operate heavy machinery while taking this medicine.  Use ice packs and elevation to help with pain.

## 2021-07-12 NOTE — ED Triage Notes (Signed)
Pt complains of right foot 4th toe pain from a fracture on Saturday.

## 2021-07-12 NOTE — Telephone Encounter (Signed)
Transition Care Management Unsuccessful Follow-up Telephone Call  Date of discharge and from where:  07/11/2021-[facility]  Attempts:  1st Attempt  Reason for unsuccessful TCM follow-up call:  Left voice message

## 2021-07-12 NOTE — ED Provider Notes (Signed)
[facility] DEPT Provider Note   CSN: [id number] Arrival date & time: 07/12/21  2033     History Chief Complaint  Patient presents with   Toe Pain    Pam Vargas is a 40 y.o. female presenting for evaluation of right foot pain.  Patient states 2 nights ago she stubbed her foot against a [employer].  Since then, she has had pain of the lateral right foot.  She was seen yesterday in the ER, diagnosed with a fracture.  Put in a postop shoe, told to use rice therapy.  She is been taking Tylenol and ibuprofen without improvement symptoms.  She reports pain is still severe.  No new trauma or injury.  Does not radiate.  No numbness.  She called her orthopedic doctor, has an appointment scheduled for Friday.  HPI     Past Medical History:  Diagnosis Date   Abnormal uterine bleeding (AUB) 03/23/2020   Advanced maternal age in multigravida    Alpha thalassemia silent carrier    Anemia    Blood transfusion without reported diagnosis    "I think so with my first delivery"   Cervical cerclage suture present 06/19/2020   06/19/20: ppx mcdonald cerclage with mersilene tape. Knot anteriorly.    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   SVT (supraventricular tachycardia) ([place])    Alpha thalassemia silent carrier 06/19/2020   Single nabothian cyst 06/03/2020   Subchorionic hemorrhage in first trimester 05/13/2020   Dry skin 03/23/2020    Benign cyst of breast 06/05/2017   History of preterm delivery 09/12/2016   Previous cesarean section 09/12/2016   Screen for sexually transmitted diseases 06/03/2011   TOBACCO USER 11/02/2009    Past Surgical History:  Procedure Laterality Date   CERVICAL CERCLAGE N/A 06/19/2020   Procedure: CERCLAGE CERVICAL;  Surgeon: Aletha Halim, MD;  Location: [facility] LD [facility]S;  Service: Gynecology;  Laterality: N/A;   CESAREAN SECTION     FOOT SURGERY     FOOT SURGERY       OB History     Gravida  6   Para  3   Term  1   Preterm  0   AB  2   Living  2      SAB  2   IAB  0   Ectopic  0   Multiple  0   Live Births  2           Family History  Problem Relation Age of Onset   Diabetes Mother    Hyperlipidemia Mother    Hypertension Mother    Heart disease Father    Hypertension Father    Breast cancer Maternal Aunt     Social History   Tobacco Use   Smoking status: Every Day    Types: Cigarettes    Last attempt to quit: 05/29/2020    Years since quitting: 1.1   Smokeless tobacco: Never  Tobacco comments:    2-3 cigarettes per day  Vaping Use   Vaping Use: Never used  Substance Use Topics   Alcohol use: Not Currently    Comment: last drink 04/21/2018   Drug use: No    Home Medications Prior to Admission medications   Medication Sig Start Date End Date Taking? Authorizing Provider  HYDROcodone-acetaminophen (NORCO/VICODIN) 5-325 MG tablet Take 1 tablet by mouth every 6 (six) hours as needed. 07/12/21  Yes Kaleth Koy, PA-C  amoxicillin-clavulanate (AUGMENTIN) 875-125 MG tablet Take 1 tablet by mouth every 12 (twelve) hours. 04/29/21   Pearson Forster, NP  Blood Pressure Monitoring (BLOOD PRESSURE KIT) DEVI 1 Device by Does not apply route as needed. 05/27/20   Aletha Halim, MD  fluconazole (DIFLUCAN) 150 MG tablet Take 1 tablet (150 mg total) by mouth daily. 06/03/21   White, Leitha Schuller, NP  lidocaine (XYLOCAINE) 2 % solution Use as directed 15 mLs in  the mouth or throat as needed for mouth pain. 04/29/21   Pearson Forster, NP  metoprolol tartrate (LOPRESSOR) 25 MG tablet Take 0.5 tablets (12.5 mg total) by mouth 2 (two) times daily. 11/02/20   Chandrasekhar, Terisa Starr, MD  metroNIDAZOLE (FLAGYL) 500 MG tablet Take 1 tablet (500 mg total) by mouth 2 (two) times daily. 06/07/21   Chase Picket, MD  permethrin (ELIMITE) 5 % cream Apply to effected area at bedtime and wash off 8-14 hours later. Can repeat in 1 week if still symptomatic. 04/01/21   Richarda Osmond, MD  Prenatal Vit-Fe Phos-FA-Omega (VITAFOL GUMMIES) 3.33-0.333-34.8 MG CHEW CHEW 1 DOSE BY MOUTH IN THE MORNING, AT NOON, AND AT BEDTIME. 03/15/21   Mullis, Kiersten P, DO  triamcinolone ointment (KENALOG) 0.5 % If rash not cleared with permethrin, use this twice per day to effected area for 1 week. 04/01/21   Richarda Osmond, MD    Allergies    Patient has no known allergies.  Review of Systems   Review of Systems  Musculoskeletal:  Positive for arthralgias.  Neurological:  Negative for numbness.   Physical Exam Updated Vital Signs BP 124/69 (BP Location: Right Arm)   Pulse 67   Temp 97.8 F (36.6 C) (Oral)   Resp 18   LMP 07/04/2021   SpO2 100%   Physical Exam Vitals and nursing note reviewed.  Constitutional:      General: She is not in acute distress.    Appearance: She is well-developed.  HENT:     Head: Normocephalic and atraumatic.  Eyes:     Extraocular Movements: Extraocular movements intact.  Cardiovascular:     Rate and Rhythm: Normal rate.  Pulmonary:     Effort: Pulmonary effort is normal.  Abdominal:     General: There is no distension.  Musculoskeletal:        General: Tenderness present. Normal range of motion.     Cervical back: Normal range of motion.     Comments: Tenderness palpation of the right foot, mostly at the base of the third and fourth toes.  No erythema or warmth.  No tense palpation of the proximal foot.  Good distal sensation and  cap refill of all toes.  Pedal pulse 2+ bilaterally.  No significant swelling of the foot  Skin:    General: Skin is warm.     Findings: No rash.  Neurological:     Mental Status: She is alert and oriented to person, place, and time.    ED Results / Procedures /  Treatments   Labs (all labs ordered are listed, but only abnormal results are displayed) Labs Reviewed - No data to display  EKG None  Radiology DG Foot Complete Right  Result Date: 07/11/2021 CLINICAL DATA:  Trauma of the third through fifth right toe with pain. EXAM: RIGHT FOOT COMPLETE - 3+ VIEW COMPARISON:  None. FINDINGS: There is nondisplaced fracture at the base of the fourth proximal phalanx. No other acute fracture or dislocation is identified. Patient status post prior fixation of the first metatarsal. IMPRESSION: Nondisplaced fracture at the base of the fourth proximal phalanx. Electronically Signed   By: Abelardo Diesel M.D.   On: 07/11/2021 10:02    Procedures Procedures   Medications Ordered in ED Medications - No data to display  ED Course  I have reviewed the triage vital signs and the nursing notes.  Pertinent labs & imaging results that were available during my care of the patient were reviewed by me and considered in my medical decision making (see chart for details).    MDM Rules/Calculators/A&P                           Patient presenting for evaluation of foot pain.  On exam, patient peers nontoxic.  She was diagnosed with a foot fracture yesterday.  I reviewed these images, shows a nondisplaced fracture at the proximal phalanx of the fourth digit.  Without new injury or worsening pain, I do not believe repeat x-rays would be beneficial.  I discussed typical course of pain with a fracture, continue treatment Tylenol and ibuprofen.  We will give a few pain pills as needed for severe breakthrough pain, discussed risks.  Encourage patient to keep her appointment with Ortho.  At this time, patient appears  safe for discharge.  Return precautions given.  Patient states she understands and agrees to plan  Final Clinical Impression(s) / ED Diagnoses Final diagnoses:  Closed nondisplaced fracture of proximal phalanx of lesser toe of right foot with routine healing, subsequent encounter    Rx / DC Orders ED Discharge Orders          Ordered    HYDROcodone-acetaminophen (NORCO/VICODIN) 5-325 MG tablet  Every 6 hours PRN        07/12/21 2102             Franchot Heidelberg, PA-C 07/12/21 2107    Sherwood Gambler, MD 07/12/21 [phone]

## 2021-07-13 ENCOUNTER — Telehealth: Payer: Self-pay

## 2021-07-13 NOTE — Telephone Encounter (Signed)
Transition Care Management Follow-up Telephone Call Date of discharge and from where: 07/12/2021-[facility]  How have you been since you were released from the hospital? Patient stated she is feeling a lot better  Any questions or concerns? No  Items Reviewed: Did the pt receive and understand the discharge instructions provided? Yes  Medications obtained and verified? Yes  Other? No  Any new allergies since your discharge? No  Dietary orders reviewed? N/A Do you have support at home? Yes   Home Care and Equipment/Supplies: Were home health services ordered? not applicable If so, what is the name of the agency? N/A  Has the agency set up a time to come to the patient's home? not applicable Were any new equipment or medical supplies ordered?  No What is the name of the medical supply agency? N/A Were you able to get the supplies/equipment? not applicable Do you have any questions related to the use of the equipment or supplies? No  Functional Questionnaire: (I = Independent and D = Dependent) ADLs: I  Bathing/Dressing- I  Meal Prep- I  Eating- I  Maintaining continence- I  Transferring/Ambulation- I  Managing Meds- I  Follow up appointments reviewed:  PCP Hospital f/u appt confirmed? No   Specialist Hospital f/u appt confirmed? Yes  Scheduled to see Dr. Alvan Dame on 07/16/2021 @ 11:45 am. Are transportation arrangements needed? No  If their condition worsens, is the pt aware to call PCP or go to the Emergency Dept.? Yes Was the patient provided with contact information for the PCP's office or ED? Yes Was to pt encouraged to call back with questions or concerns? Yes

## 2021-07-13 NOTE — Telephone Encounter (Signed)
Transition Care Management Follow-up Telephone Call Date of discharge and from where: 07/11/2021 from [facility] How have you been since you were released from the hospital? Pt stated that she is still in pain but it is better with the pain medication.  Any questions or concerns? No  Items Reviewed: Did the pt receive and understand the discharge instructions provided? Yes  Medications obtained and verified? Yes  Other? No  Any new allergies since your discharge? No  Dietary orders reviewed? No Do you have support at home? Yes   Functional Questionnaire: (I = Independent and D = Dependent) ADLs: I  Bathing/Dressing- I  Meal Prep- I  Eating- I  Maintaining continence- I  Transferring/Ambulation- I  Managing Meds- I   Follow up appointments reviewed:  PCP Hospital f/u appt confirmed? Yes  Scheduled to see [facility] Nurse on 07/16/2021 @ [facility] f/u appt confirmed? No   Are transportation arrangements needed? No  If their condition worsens, is the pt aware to call PCP or go to the Emergency Dept.? Yes Was the patient provided with contact information for the PCP's office or ED? Yes Was to pt encouraged to call back with questions or concerns? Yes

## 2021-07-16 ENCOUNTER — Ambulatory Visit (INDEPENDENT_AMBULATORY_CARE_PROVIDER_SITE_OTHER): Payer: Medicaid Other

## 2021-07-16 ENCOUNTER — Other Ambulatory Visit: Payer: Self-pay

## 2021-07-16 DIAGNOSIS — S9031XA Contusion of right foot, initial encounter: Secondary | ICD-10-CM | POA: Diagnosis not present

## 2021-07-16 DIAGNOSIS — Z23 Encounter for immunization: Secondary | ICD-10-CM | POA: Diagnosis not present

## 2021-07-19 NOTE — Progress Notes (Signed)
Patient presents to nurse clinic for flu vaccination. Administered in RD, site unremarkable, tolerated injection well.   Annora Guderian C Yang Rack, RN  

## 2021-07-24 DIAGNOSIS — Z20822 Contact with and (suspected) exposure to covid-19: Secondary | ICD-10-CM | POA: Diagnosis not present

## 2021-07-26 ENCOUNTER — Other Ambulatory Visit: Payer: Self-pay

## 2021-07-26 ENCOUNTER — Encounter (HOSPITAL_COMMUNITY): Payer: Self-pay

## 2021-07-26 ENCOUNTER — Encounter: Payer: Self-pay | Admitting: Student

## 2021-07-26 ENCOUNTER — Ambulatory Visit (HOSPITAL_COMMUNITY)
Admission: EM | Admit: 2021-07-26 | Discharge: 2021-07-26 | Disposition: A | Discharge status: Home / Self Care | Payer: Medicaid Other | Attending: Emergency Medicine | Admitting: Emergency Medicine

## 2021-07-26 DIAGNOSIS — U071 COVID-19: Secondary | ICD-10-CM

## 2021-07-26 MED ORDER — FLUTICASONE PROPIONATE 50 MCG/ACT NA SUSP: 2.0000 | Freq: Every day | NASAL | 0 refills | Status: DC

## 2021-07-26 MED ORDER — BENZONATATE 100 MG PO CAPS: 100.0000 mg | ORAL_CAPSULE | Freq: Three times a day (TID) | ORAL | 0 refills | Status: DC | PRN

## 2021-07-26 NOTE — ED Triage Notes (Signed)
Pt presents with non productive cough, headache, nasal drainage, congestion, fatigue, and generalized body aches since testing positive for covid X 3 days ago.

## 2021-07-26 NOTE — ED Provider Notes (Signed)
[facility]    CSN: [id number] Arrival date & time: 07/26/21  1713      History   Chief Complaint Chief Complaint  Patient presents with   Covid +    HPI Pam Vargas is a 40 y.o. female.  Patient's 38-monthold infant tested positive for COVID a week ago.  Patient began having symptoms of COVID 2 days ago and tested positive at home.  Complains of sore throat, headache, congestion, cough.  Denies productive cough.  Denies shortness of breath or wheezing.  Has not been taking anything to manage symptoms.  HPI  Past Medical History:  Diagnosis Date   Abnormal uterine bleeding (AUB) 03/23/2020   Advanced maternal age in multigravida    Alpha thalassemia silent carrier    Anemia    Blood transfusion without reported diagnosis    "I think so with my first delivery"   Cervical cerclage suture present 06/19/2020   06/19/20: ppx mcdonald cerclage with mersilene tape. Knot anteriorly.    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   [place]    Alpha thalassemia silent carrier 06/19/2020   Single nabothian cyst 06/03/2020   Subchorionic hemorrhage in first trimester 05/13/2020   Dry skin 03/23/2020   Benign cyst of breast 06/05/2017   History of preterm delivery 09/12/2016   Previous cesarean section 09/12/2016   Screen for sexually transmitted diseases 06/03/2011   TOBACCO USER 11/02/2009    Past Surgical  History:  Procedure Laterality Date   CERVICAL CERCLAGE N/A 06/19/2020   Procedure: CERCLAGE CERVICAL;  Surgeon: PAletha Halim MD;  Location: [facility];  Service: Gynecology;  Laterality: N/A;   CESAREAN SECTION     FOOT SURGERY     FOOT SURGERY      OB History     Gravida  6   Para  3   Term  1   Preterm  0   AB  2   Living  2      SAB  2   IAB  0   Ectopic  0   Multiple  0   Live Births  2            Home Medications    Prior to Admission medications   Medication Sig Start Date End Date Taking? Authorizing Provider  benzonatate (TESSALON) 100 MG capsule Take 1 capsule (100 mg total) by mouth 3 (three) times daily as needed for cough. 07/26/21  Yes KCarvel Getting NP  fluticasone (FLONASE) 50 MCG/ACT nasal spray Place 2 sprays into both nostrils daily. 07/26/21  Yes KCarvel Getting NP  amoxicillin-clavulanate (AUGMENTIN) 875-125 MG tablet Take 1 tablet by mouth every 12 (twelve) hours. 04/29/21   SPearson Forster NP  Blood Pressure Monitoring (BLOOD PRESSURE KIT) DEVI 1 Device by Does not apply route as needed. 05/27/20   PAletha Halim MD  fluconazole (DIFLUCAN) 150 MG tablet Take 1 tablet (150 mg total)  by mouth daily. 06/03/21   White, Leitha Schuller, NP  HYDROcodone-acetaminophen (NORCO/VICODIN) 5-325 MG tablet Take 1 tablet by mouth every 6 (six) hours as needed. 07/12/21   Caccavale, Sophia, PA-C  lidocaine (XYLOCAINE) 2 % solution Use as directed 15 mLs in the mouth or throat as needed for mouth pain. 04/29/21   Pearson Forster, NP  metoprolol tartrate (LOPRESSOR) 25 MG tablet Take 0.5 tablets (12.5 mg total) by mouth 2 (two) times daily. 11/02/20   Chandrasekhar, Terisa Starr, MD  metroNIDAZOLE (FLAGYL) 500 MG tablet Take 1 tablet (500 mg total) by mouth 2 (two) times daily. 06/07/21   Chase Picket, MD  permethrin (ELIMITE) 5 % cream Apply to effected area at bedtime and wash off 8-14 hours later. Can repeat in 1 week if still symptomatic. 04/01/21   Richarda Osmond, MD  Prenatal Vit-Fe Phos-FA-Omega (VITAFOL GUMMIES) 3.33-0.333-34.8 MG CHEW CHEW 1 DOSE BY MOUTH IN THE MORNING, AT NOON, AND AT BEDTIME. 03/15/21   Mullis, Kiersten P, DO  triamcinolone ointment (KENALOG) 0.5 % If rash not cleared with permethrin, use this twice per day to effected area for 1 week. 04/01/21   Richarda Osmond, MD    Family History Family History  Problem Relation Age of Onset   Diabetes Mother    Hyperlipidemia Mother    Hypertension Mother    Heart disease Father    Hypertension Father    Breast cancer Maternal Aunt     Social History Social History   Tobacco Use   Smoking status: Every Day    Types: Cigarettes    Last attempt to quit: 05/29/2020    Years since quitting: 1.1   Smokeless tobacco: Never   Tobacco comments:    2-3 cigarettes per day  Vaping Use   Vaping Use: Never used  Substance Use Topics   Alcohol use: Not Currently    Comment: last drink 04/21/2018   Drug use: No     Allergies   Patient has no known allergies.   Review of Systems Review of Systems  Constitutional:  Negative for chills and fever.  HENT:  Positive for congestion, postnasal drip, rhinorrhea, sinus pressure and sore throat.   Respiratory:  Positive for cough. Negative for shortness of breath.   Neurological:  Positive for headaches.    Physical Exam Triage Vital Signs ED Triage Vitals  Enc Vitals Group     BP 07/26/21 1837 111/74     Pulse Rate 07/26/21 1837 71     Resp 07/26/21 1837 17     Temp 07/26/21 1837 98.3 F (36.8 C)     Temp Source 07/26/21 1837 Oral     SpO2 07/26/21 1837 96 %     Weight --      Height --      Head Circumference --      Peak Flow --      Pain Score 07/26/21 1840 5     Pain Loc --      Pain Edu? --      Excl. in [place]? --    No data found.  Updated Vital Signs BP 111/74 (BP Location: Right Arm)   Pulse 71   Temp 98.3 F (36.8 C) (Oral)   Resp 17   LMP 07/04/2021   SpO2 96%   Visual Acuity Right Eye  Distance:   Left Eye Distance:   Bilateral Distance:    Right Eye Near:   Left Eye Near:    Bilateral  Near:     Physical Exam Constitutional:      Appearance: Normal appearance. She is ill-appearing.  HENT:     Right Ear: Tympanic membrane, ear canal and external ear normal.     Left Ear: Tympanic membrane, ear canal and external ear normal.     Nose: Congestion and rhinorrhea present.     Mouth/Throat:     Mouth: Mucous membranes are moist.     Pharynx: Oropharynx is clear.  Cardiovascular:     Rate and Rhythm: Normal rate and regular rhythm.  Pulmonary:     Effort: Pulmonary effort is normal.     Breath sounds: Normal breath sounds.  Lymphadenopathy:     Cervical: No cervical adenopathy.  Neurological:     Mental Status: She is alert.     UC Treatments / Results  Labs (all labs ordered are listed, but only abnormal results are displayed) Labs Reviewed - No data to display  EKG   Radiology No results found.  Procedures Procedures (including critical care time)  Medications Ordered in UC Medications - No data to display  Initial Impression / Assessment and Plan / UC Course  I have reviewed the triage vital signs and the nursing notes.  Pertinent labs & imaging results that were available during my care of the patient were reviewed by me and considered in my medical decision making (see chart for details).  Patient does not qualify for antiviral therapy for COVID.  Reviewed supportive care measures.  Reviewed quarantine procedures.   Final Clinical Impressions(s) / UC Diagnoses   Final diagnoses:  [id number]     Discharge Instructions      Benzonatate may not be covered by your insurance. If it is not, you can use over the counter Delsym (or generic dextromethorphan) for cough.  Flonase may not be covered by your insurance.  If it is not it is available over-the-counter.  Also use saline nasal spray to help relieve your congestion.  You need to  quarantine for at least 5 days while you have COVID.  On day 6, if your symptoms have completely resolved you may go back to life but you need to wear a mask out in public for another 5 days.  If you still have symptoms on day 6, you need to quarantine for a total of 10 days.   ED Prescriptions     Medication Sig Dispense Auth. Provider   fluticasone (FLONASE) 50 MCG/ACT nasal spray Place 2 sprays into both nostrils daily. 16 g Carvel Getting, NP   benzonatate (TESSALON) 100 MG capsule Take 1 capsule (100 mg total) by mouth 3 (three) times daily as needed for cough. 21 capsule Carvel Getting, NP      PDMP not reviewed this encounter.   Carvel Getting, NP 07/26/21 1918

## 2021-07-26 NOTE — Discharge Instructions (Addendum)
Benzonatate may not be covered by your insurance. If it is not, you can use over the counter Delsym (or generic dextromethorphan) for cough.  Flonase may not be covered by your insurance.  If it is not it is available over-the-counter.  Also use saline nasal spray to help relieve your congestion.  You need to quarantine for at least 5 days while you have COVID.  On day 6, if your symptoms have completely resolved you may go back to life but you need to wear a mask out in public for another 5 days.  If you still have symptoms on day 6, you need to quarantine for a total of 10 days.

## 2021-07-30 ENCOUNTER — Ambulatory Visit: Payer: Medicaid Other

## 2021-08-03 ENCOUNTER — Ambulatory Visit (INDEPENDENT_AMBULATORY_CARE_PROVIDER_SITE_OTHER): Payer: Medicaid Other | Admitting: Family Medicine

## 2021-08-03 ENCOUNTER — Other Ambulatory Visit: Payer: Self-pay

## 2021-08-03 VITALS — BP 100/70 | HR 104 | Ht 64.0 in | Wt 134.0 lb

## 2021-08-03 DIAGNOSIS — L301 Dyshidrosis [pompholyx]: Secondary | ICD-10-CM

## 2021-08-03 DIAGNOSIS — L309 Dermatitis, unspecified: Secondary | ICD-10-CM

## 2021-08-03 MED ORDER — TRIAMCINOLONE ACETONIDE 0.5 % EX OINT: TOPICAL_OINTMENT | CUTANEOUS | 0 refills | Status: DC

## 2021-08-03 NOTE — Patient Instructions (Signed)
Thank you for coming into the office today.   Stop by the pharmacy to pick up your steroid hand ointment.  Take Care,   Dr. Susa Simmonds

## 2021-08-03 NOTE — Assessment & Plan Note (Addendum)
Symptoms and history consistent with dyshidrotic eczema of hands. Treat with Kenalog 0.5% ointment BID to affected area. Advised to use emollients regularly. Follow up with PCP if persistent symptoms. Consider scabies treatment if not improving with potent steroids.

## 2021-08-03 NOTE — Progress Notes (Signed)
   SUBJECTIVE:   CHIEF COMPLAINT / HPI:   Chief Complaint  Patient presents with   Hand Pain    both     Pam Vargas is a 40 y.o. female here for hand burning since May. It stopped for a month and then reappeared. Sx "came out of no-where". States her right hand broke out and then spread to her left hand. Lotions, hand sanitizer, soap and washing dishes cause burning. Has itching. States there are cuts in her hands now. No one else has similar sx. She was treated with steroid cream in the past.      PERTINENT  PMH / PSH: reviewed and updated as appropriate   OBJECTIVE:   BP 100/70   Pulse (!) 104   Ht 5\' 4"  (1.626 m)   Wt 134 lb (60.8 kg)   LMP 07/04/2021   SpO2 100%   BMI 23.00 kg/m    GEN: well appearing fe   female in no acute distress  CVS: well perfused  RESP: speaking in full sentences without pause, no respiratory distress  SKIN: rough hyperpigmented patches between the webs of the fingers (see images below)     ASSESSMENT/PLAN:   Dyshidrotic eczema Symptoms and history consistent with dyshidrotic eczema of hands. Treat with Kenalog 0.5% ointment BID to affected area. Advised to use emollients regularly. Follow up with PCP if persistent symptoms. Consider scabies treatment if not improving with potent steroids.      Lyndee Hensen, DO PGY-3, [facility] 08/03/2021

## 2021-10-13 ENCOUNTER — Ambulatory Visit (HOSPITAL_COMMUNITY)
Admission: EM | Admit: 2021-10-13 | Discharge: 2021-10-13 | Disposition: A | Discharge status: Home / Self Care | Payer: Medicaid Other | Attending: Family Medicine | Admitting: Family Medicine

## 2021-10-13 ENCOUNTER — Other Ambulatory Visit: Payer: Self-pay

## 2021-10-13 ENCOUNTER — Ambulatory Visit (INDEPENDENT_AMBULATORY_CARE_PROVIDER_SITE_OTHER): Payer: Medicaid Other

## 2021-10-13 ENCOUNTER — Encounter (HOSPITAL_COMMUNITY): Payer: Self-pay | Admitting: Emergency Medicine

## 2021-10-13 DIAGNOSIS — M4302 Spondylolysis, cervical region: Secondary | ICD-10-CM | POA: Diagnosis not present

## 2021-10-13 DIAGNOSIS — M2578 Osteophyte, vertebrae: Secondary | ICD-10-CM | POA: Diagnosis not present

## 2021-10-13 DIAGNOSIS — R200 Anesthesia of skin: Secondary | ICD-10-CM | POA: Diagnosis not present

## 2021-10-13 DIAGNOSIS — M542 Cervicalgia: Secondary | ICD-10-CM | POA: Diagnosis not present

## 2021-10-13 DIAGNOSIS — M47812 Spondylosis without myelopathy or radiculopathy, cervical region: Secondary | ICD-10-CM | POA: Diagnosis not present

## 2021-10-13 DIAGNOSIS — R202 Paresthesia of skin: Secondary | ICD-10-CM | POA: Diagnosis not present

## 2021-10-13 MED ORDER — TIZANIDINE HCL 4 MG PO TABS: 4.0000 mg | ORAL_TABLET | Freq: Every day | ORAL | 0 refills | Status: DC

## 2021-10-13 MED ORDER — TIZANIDINE HCL 4 MG PO TABS: 4.0000 mg | ORAL_TABLET | Freq: Four times a day (QID) | ORAL | 0 refills | Status: DC | PRN

## 2021-10-13 MED ORDER — PREDNISONE 20 MG PO TABS: 40.0000 mg | ORAL_TABLET | Freq: Every day | ORAL | 0 refills | Status: DC

## 2021-10-13 NOTE — ED Provider Notes (Addendum)
[facility]     CSN: [id number] Arrival date & time: 10/13/21  1004      History   Chief Complaint Chief Complaint  Patient presents with   Neck Pain    HPI Pam Vargas is a 40 y.o. female.   HPI Patient presents today with neck pain which she describes as throbbing with intermittent numbness and tingling which occurred without injury 3 days ago.  She reports symptoms have subsequently worsened.  She denies any associated shoulder pain. Pain is precipitated by lifting and movement of the neck. She has taken over the counter medication without relief of pain. No prior history of neck pain, although in review of EMR patient had an abnormal imaging of the cervical spine which revealed "Minimal mid cervical spondylosis" .  Past Medical History:  Diagnosis Date   Abnormal uterine bleeding (AUB) 03/23/2020   Advanced maternal age in multigravida    Alpha thalassemia silent carrier    Anemia    Blood transfusion without reported diagnosis    "I think so with my first delivery"   Cervical cerclage suture present 06/19/2020   06/19/20: ppx mcdonald cerclage with mersilene tape. Knot anteriorly.    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   SVT (supraventricular tachycardia) ([place])    Alpha thalassemia silent carrier 06/19/2020   Single nabothian cyst 06/03/2020   Subchorionic hemorrhage in first trimester 05/13/2020   Dry skin 03/23/2020   Benign cyst  of breast 06/05/2017   History of preterm delivery 09/12/2016   Previous cesarean section 09/12/2016   Screen for sexually transmitted diseases 06/03/2011   TOBACCO USER 11/02/2009    Past Surgical History:  Procedure Laterality Date   CERVICAL CERCLAGE N/A 06/19/2020   Procedure: CERCLAGE CERVICAL;  Surgeon: Aletha Halim, MD;  Location: [facility] LD ORS;  Service: Gynecology;  Laterality: N/A;   CESAREAN SECTION     FOOT SURGERY     FOOT SURGERY      OB History     Gravida  6   Para  3   Term  1   Preterm  0   AB  2   Living  2      SAB  2   IAB  0   Ectopic  0   Multiple  0   Live Births  2            Home Medications    Prior to Admission medications   Medication Sig Start Date End Date Taking? Authorizing Provider  predniSONE (DELTASONE) 20 MG tablet Take 2 tablets (40 mg total) by mouth daily with breakfast. 10/13/21  Yes Scot Jun, FNP  amoxicillin-clavulanate (AUGMENTIN) 875-125 MG tablet Take 1 tablet by mouth every 12 (twelve) hours. 04/29/21   Pearson Forster, NP  benzonatate (TESSALON) 100 MG capsule Take 1 capsule (100 mg total) by mouth 3 (three) times daily as needed for cough. 07/26/21   Carvel Getting,  NP  Blood Pressure Monitoring (BLOOD PRESSURE KIT) DEVI 1 Device by Does not apply route as needed. 05/27/20   Aletha Halim, MD  fluconazole (DIFLUCAN) 150 MG tablet Take 1 tablet (150 mg total) by mouth daily. 06/03/21   White, Leitha Schuller, NP  fluticasone (FLONASE) 50 MCG/ACT nasal spray Place 2 sprays into both nostrils daily. 07/26/21   Carvel Getting, NP  HYDROcodone-acetaminophen (NORCO/VICODIN) 5-325 MG tablet Take 1 tablet by mouth every 6 (six) hours as needed. 07/12/21   Caccavale, Sophia, PA-C  lidocaine (XYLOCAINE) 2 % solution Use as directed 15 mLs in the mouth or throat as needed for mouth pain. 04/29/21   Pearson Forster, NP  metoprolol tartrate (LOPRESSOR) 25 MG tablet Take 0.5 tablets (12.5 mg total) by mouth 2 (two) times  daily. 11/02/20   Chandrasekhar, Terisa Starr, MD  metroNIDAZOLE (FLAGYL) 500 MG tablet Take 1 tablet (500 mg total) by mouth 2 (two) times daily. 06/07/21   Chase Picket, MD  permethrin (ELIMITE) 5 % cream Apply to effected area at bedtime and wash off 8-14 hours later. Can repeat in 1 week if still symptomatic. 04/01/21   Richarda Osmond, MD  Prenatal Vit-Fe Phos-FA-Omega (VITAFOL GUMMIES) 3.33-0.333-34.8 MG CHEW CHEW 1 DOSE BY MOUTH IN THE MORNING, AT NOON, AND AT BEDTIME. 03/15/21   Mullis, Kiersten P, DO  tiZANidine (ZANAFLEX) 4 MG tablet Take 1 tablet (4 mg total) by mouth at bedtime. 10/13/21   Scot Jun, FNP  triamcinolone ointment (KENALOG) 0.5 % If rash not cleared with permethrin, use this twice per day to effected area for 1 week. 08/03/21   Lyndee Hensen, DO    Family History Family History  Problem Relation Age of Onset   Diabetes Mother    Hyperlipidemia Mother    Hypertension Mother    Heart disease Father    Hypertension Father    Breast cancer Maternal Aunt     Social History Social History   Tobacco Use   Smoking status: Every Day    Types: Cigarettes    Last attempt to quit: 05/29/2020    Years since quitting: 1.3   Smokeless tobacco: Never   Tobacco comments:    2-3 cigarettes per day  Vaping Use   Vaping Use: Never used  Substance Use Topics   Alcohol use: Not Currently    Comment: last drink 04/21/2018   Drug use: No     Allergies   Patient has no known allergies.  Review of Systems Review of Systems Pertinent negatives listed in HPI Physical Exam Triage Vital Signs ED Triage Vitals [10/13/21 1057]  Enc Vitals Group     BP      Pulse      Resp      Temp      Temp src      SpO2      Weight      Height      Head Circumference      Peak Flow      Pain Score 9     Pain Loc      Pain Edu?      Excl. in [place]?    No data found.  Updated Vital Signs BP 107/74 (BP Location: Right Arm)    Pulse 74    Temp 98.7 F (37.1 C) (Oral)     Resp 17    LMP 09/25/2021    SpO2 96%   Visual Acuity Right Eye Distance:   Left Eye  Distance:   Bilateral Distance:    Right Eye Near:   Left Eye Near:    Bilateral Near:     Physical Exam Constitutional:      Appearance: Normal appearance.  HENT:     Head: Normocephalic and atraumatic.  Neck:     Vascular: No carotid bruit.  Cardiovascular:     Rate and Rhythm: Normal rate and regular rhythm.  Pulmonary:     Effort: Pulmonary effort is normal.     Breath sounds: Normal breath sounds.  Musculoskeletal:     Cervical back: Tenderness and bony tenderness present. No swelling. Pain with movement present.     Thoracic back: Normal.     Lumbar back: Normal.  Lymphadenopathy:     Cervical: No cervical adenopathy.  Skin:    Capillary Refill: Capillary refill takes less than 2 seconds.  Neurological:     General: No focal deficit present.     Mental Status: She is alert.     UC Treatments / Results  Labs (all labs ordered are listed, but only abnormal results are displayed) Labs Reviewed - No data to display  EKG   Radiology DG Cervical Spine 2-3 Views  Result Date: 10/13/2021 CLINICAL DATA:  Neck pain with numbness, tingling and burning. EXAM: CERVICAL SPINE - 2-3 VIEW COMPARISON:  05/31/2016 FINDINGS: Mid cervical spondylosis with mild disc space narrowing and endplate osteophytes at C4-5, C5-6 and C6-7. Findings have worsened slightly since 2017. Minimal curvature of the neck convex to the left. Soft tissue shadows are normal. IMPRESSION: Spondylosis at C4-5, C5-6 and C6-7, worsened since 2017. Electronically Signed   By: Nelson Chimes M.D.   On: 10/13/2021 11:21     Procedures Procedures (including critical care time)  Medications Ordered in UC Medications - No data to display  Initial Impression / Assessment and Plan / UC Course  I have reviewed the triage vital signs and the nursing notes.  Pertinent labs & imaging results that were available during my care of  the patient were reviewed by me and considered in my medical decision making (see chart for details).    Cervical spondylosis, tx with prednisone 40 mg x 5 days. Tizanidine QHS for pain. Orthopedics follow-up if symptoms worsen or doesn't readily improve. Final Clinical Impressions(s) / UC Diagnoses   Final diagnoses:  Cervical spondylolysis     Discharge Instructions      Start medication and complete entire course. Once prednisone is completed, start performing the neck exercises listed in your discharge paperwork. As discussed follow-up with [facility] for ongoing evaluation of your cervical neck condition.     ED Prescriptions     Medication Sig Dispense Auth. Provider   predniSONE (DELTASONE) 20 MG tablet Take 2 tablets (40 mg total) by mouth daily with breakfast. 10 tablet Scot Jun, FNP   tiZANidine (ZANAFLEX) 4 MG tablet  (Status: Discontinued) Take 1 tablet (4 mg total) by mouth every 6 (six) hours as needed for muscle spasms. 30 tablet Scot Jun, FNP   tiZANidine (ZANAFLEX) 4 MG tablet  (Status: Discontinued) Take 1 tablet (4 mg total) by mouth at bedtime. 30 tablet Scot Jun, FNP   tiZANidine (ZANAFLEX) 4 MG tablet Take 1 tablet (4 mg total) by mouth at bedtime. 30 tablet Scot Jun, FNP      PDMP not reviewed this encounter.   Scot Jun, FNP 10/17/21 1501    Scot Jun, FNP 10/17/21 [phone]

## 2021-10-13 NOTE — ED Triage Notes (Signed)
Neck pain with tingling, numbness and burning for a couple days. Denies injuries.

## 2021-10-13 NOTE — Discharge Instructions (Addendum)
Start medication and complete entire course. Once prednisone is completed, start performing the neck exercises listed in your discharge paperwork. As discussed follow-up with [facility] for ongoing evaluation of your cervical neck condition.

## 2021-10-27 ENCOUNTER — Other Ambulatory Visit: Payer: Self-pay | Admitting: Family Medicine

## 2021-10-27 ENCOUNTER — Telehealth: Payer: Self-pay | Admitting: Internal Medicine

## 2021-10-27 MED ORDER — METOPROLOL TARTRATE 25 MG PO TABS: 12.5000 mg | ORAL_TABLET | Freq: Two times a day (BID) | ORAL | 1 refills | Status: DC

## 2021-10-27 NOTE — Telephone Encounter (Signed)
Pt's medication was sent to pt's pharmacy as requested. Confirmation received.  °

## 2021-10-27 NOTE — Telephone Encounter (Signed)
°*  STAT* If patient is at the pharmacy, call can be transferred to refill team.   1. Which medications need to be refilled? (please list name of each medication and dose if known)  metoprolol tartrate (LOPRESSOR) 25 MG tablet 0.5 tablet twice daily   2. Which pharmacy/location (including street and city if local pharmacy) is medication to be sent to? [facility] [place], [place]  3. Do they need a 30 day or 90 day supply? 90 with refills

## 2021-11-24 ENCOUNTER — Other Ambulatory Visit: Payer: Self-pay | Admitting: Family Medicine

## 2021-11-24 DIAGNOSIS — Z1231 Encounter for screening mammogram for malignant neoplasm of breast: Secondary | ICD-10-CM

## 2021-11-26 ENCOUNTER — Emergency Department (HOSPITAL_COMMUNITY)
Admission: EM | Admit: 2021-11-26 | Discharge: 2021-11-26 | Disposition: A | Discharge status: Home / Self Care | Payer: Medicaid Other | Attending: Emergency Medicine | Admitting: Emergency Medicine

## 2021-11-26 ENCOUNTER — Emergency Department (HOSPITAL_COMMUNITY): Payer: Medicaid Other

## 2021-11-26 ENCOUNTER — Encounter (HOSPITAL_COMMUNITY): Payer: Self-pay | Admitting: Emergency Medicine

## 2021-11-26 DIAGNOSIS — M791 Myalgia, unspecified site: Secondary | ICD-10-CM | POA: Diagnosis present

## 2021-11-26 DIAGNOSIS — J101 Influenza due to other identified influenza virus with other respiratory manifestations: Secondary | ICD-10-CM | POA: Diagnosis not present

## 2021-11-26 DIAGNOSIS — R52 Pain, unspecified: Secondary | ICD-10-CM | POA: Diagnosis not present

## 2021-11-26 DIAGNOSIS — Z20822 Contact with and (suspected) exposure to covid-19: Secondary | ICD-10-CM | POA: Diagnosis not present

## 2021-11-26 LAB — RESP PANEL BY RT-PCR (FLU A&B, COVID) ARPGX2
Influenza A by PCR: POSITIVE — AB
Influenza B by PCR: NEGATIVE
SARS Coronavirus 2 by RT PCR: NEGATIVE

## 2021-11-26 MED ORDER — ONDANSETRON 4 MG PO TBDP: 4.0000 mg | ORAL_TABLET | Freq: Three times a day (TID) | ORAL | 0 refills | Status: DC | PRN

## 2021-11-26 MED ORDER — ACETAMINOPHEN 325 MG PO TABS
650.0000 mg | ORAL_TABLET | Freq: Once | ORAL | Status: AC
  Administered 2021-11-26: 650 mg via ORAL
  Filled 2021-11-26: qty 2

## 2021-11-26 MED ORDER — OSELTAMIVIR PHOSPHATE 75 MG PO CAPS: 75.0000 mg | ORAL_CAPSULE | Freq: Two times a day (BID) | ORAL | 0 refills | Status: DC

## 2021-11-26 NOTE — ED Provider Triage Note (Signed)
Emergency Medicine Provider Triage Evaluation Note  Pam Vargas , a 41 y.o. female  was evaluated in triage.  Pt complains of cough, congestion, myalgias, malaise, subjective fever, and chills.  This all started this morning.  No abdominal symptoms.  No history of breathing problems.  Review of Systems  Positive:  Negative: See above   Physical Exam  BP 99/68 (BP Location: Right Arm)    Pulse (!) 116    Temp 100.1 F (37.8 C) (Oral)    Resp (!) 24    SpO2 98%  Gen:   Awake, no distress   Resp:  Normal effort  MSK:   Moves extremities without difficulty  Other:    Medical Decision Making  Medically screening exam initiated at 12:26 PM.  Appropriate orders placed.  Pam Vargas was informed that the remainder of the evaluation will be completed by another provider, this initial triage assessment does not replace that evaluation, and the importance of remaining in the ED until their evaluation is complete.     Pam Vargas, [place] 11/26/21 1230

## 2021-11-26 NOTE — Discharge Instructions (Signed)
Please read and follow all provided instructions.  Your diagnoses today include:  1. Influenza A   2. Generalized body aches     Tests performed today include: Flu/COVID testing: flu is positive Chest x-ray: no pneumonia Vital signs. See below for your results today.   Medications prescribed:  Tamiflu - medication for influenza  This medication, when taken within the first 48 hours of illness, may help decrease the severity of the flu and cause symptoms to improve approximately 12 hours sooner. About 1 out of 5 patients may have diarrhea and vomiting from this medication.   Zofran (ondansetron) - for nausea and vomiting  Take any prescribed medications only as directed.  Home care instructions:  Follow any educational materials contained in this packet. Please continue drinking plenty of fluids. Use over-the-counter cold and flu medications as needed as directed on packaging for symptom relief. You may also use ibuprofen or tylenol as directed on packaging for pain or fever.   BE VERY CAREFUL not to take multiple medicines containing Tylenol (also called acetaminophen). Doing so can lead to an overdose which can damage your liver and cause liver failure and possibly death.   Follow-up instructions: Please follow-up with your primary care provider in the next 3 days for further evaluation of your symptoms.   Return instructions:  Please return to the Emergency Department if you experience worsening symptoms. Please return if you have a high fever greater than 101 degrees not controlled with over-the-counter medications, persistent vomiting and cannot keep down fluids, or worsening trouble breathing. Please return if you have any other emergent concerns.  Additional Information:  Your vital signs today were: BP 99/68 (BP Location: Right Arm)    Pulse (!) 116    Temp 100.1 F (37.8 C) (Oral)    Resp (!) 24    SpO2 98%  If your blood pressure (BP) was elevated above 135/85 this  visit, please have this repeated by your doctor within one month.

## 2021-11-26 NOTE — ED Triage Notes (Signed)
Patient here with complaint of generalized body aches that started this morning, reports being vaccinated against COVID and influenza. Patient is alert, oriented, speaking in complete sentences, and is in no apparent distress at this time.

## 2021-11-26 NOTE — ED Provider Notes (Addendum)
[facility] EMERGENCY DEPARTMENT Provider Note   CSN: [id number] Arrival date & time: 11/26/21  1204     History  Chief Complaint  Patient presents with   Generalized Body Aches    Pam Vargas is a 41 y.o. female.  Patient with history of tobacco use presents to the emergency department today for flulike illness starting this morning.  Patient works in housekeeping.  She has received flu vaccine.  Son is sick at home with coughing.  She denies other contacts.  She reports body aches, subjective fever and chills, headache.  No vomiting or diarrhea.  She has had cough. Patient denies risk factors for pulmonary embolism including: unilateral leg swelling, history of DVT/PE/other blood clots, use of exogenous hormones, recent immobilizations, recent surgery, recent travel (>4hr segment), malignancy, hemoptysis.         Home Medications Prior to Admission medications   Medication Sig Start Date End Date Taking? Authorizing Provider  ondansetron (ZOFRAN-ODT) 4 MG disintegrating tablet Take 1 tablet (4 mg total) by mouth every 8 (eight) hours as needed for nausea or vomiting. 11/26/21  Yes Carlisle Cater, PA-C  oseltamivir (TAMIFLU) 75 MG capsule Take 1 capsule (75 mg total) by mouth every 12 (twelve) hours. 11/26/21  Yes Carlisle Cater, PA-C  amoxicillin-clavulanate (AUGMENTIN) 875-125 MG tablet Take 1 tablet by mouth every 12 (twelve) hours. 04/29/21   Pearson Forster, NP  benzonatate (TESSALON) 100 MG capsule Take 1 capsule (100 mg total) by mouth 3 (three) times daily as needed for cough. 07/26/21   Carvel Getting, NP  Blood Pressure Monitoring (BLOOD PRESSURE KIT) DEVI 1 Device by Does not apply route as needed. 05/27/20   Aletha Halim, MD  fluconazole (DIFLUCAN) 150 MG tablet Take 1 tablet (150 mg total) by mouth daily. 06/03/21   White, Leitha Schuller, NP  fluticasone (FLONASE) 50 MCG/ACT nasal spray Place 2 sprays into both nostrils daily. 07/26/21   Carvel Getting, NP  HYDROcodone-acetaminophen (NORCO/VICODIN) 5-325 MG tablet Take 1 tablet by mouth every 6 (six) hours as needed. 07/12/21   Caccavale, Sophia, PA-C  lidocaine (XYLOCAINE) 2 % solution Use as directed 15 mLs in the mouth or throat as needed for mouth pain. 04/29/21   Pearson Forster, NP  metoprolol tartrate (LOPRESSOR) 25 MG tablet Take 0.5 tablets (12.5 mg total) by mouth 2 (two) times daily. 10/27/21   Werner Lean, MD  metroNIDAZOLE (FLAGYL) 500 MG tablet Take 1 tablet (500 mg total) by mouth 2 (two) times daily. 06/07/21   Chase Picket, MD  permethrin (ELIMITE) 5 % cream Apply to effected area at bedtime and wash off 8-14 hours later. Can repeat in 1 week if still symptomatic. 04/01/21   Richarda Osmond, MD  predniSONE (DELTASONE) 20 MG tablet Take 2 tablets (40 mg total) by mouth daily with breakfast. 10/13/21   Scot Jun, FNP  Prenatal Vit-Fe Phos-FA-Omega (VITAFOL GUMMIES) 3.33-0.333-34.8 MG CHEW CHEW 1 DOSE BY MOUTH IN THE MORNING, AT NOON, AND AT BEDTIME. 03/15/21   Mullis, Kiersten P, DO  tiZANidine (ZANAFLEX) 4 MG tablet Take 1 tablet (4 mg total) by mouth at bedtime. 10/13/21   Scot Jun, FNP  triamcinolone ointment (KENALOG) 0.5 % If rash not cleared with permethrin, use this twice per day to effected area for 1 week. 08/03/21   Lyndee Hensen, DO      Allergies    Patient has no known allergies.    Review of Systems  Review of Systems  Physical Exam Updated Vital Signs BP 99/68 (BP Location: Right Arm)    Pulse (!) 116    Temp 100.1 F (37.8 C) (Oral)    Resp (!) 24    SpO2 98%  Physical Exam Vitals and nursing note reviewed.  Constitutional:      Appearance: She is well-developed.  HENT:     Head: Normocephalic and atraumatic.     Jaw: No trismus.     Right Ear: External ear normal.     Left Ear: External ear normal.     Nose: Nose normal. No mucosal edema or rhinorrhea.     Mouth/Throat:     Mouth: Mucous membranes are moist.  Mucous membranes are not dry. No oral lesions.     Pharynx: Uvula midline. No oropharyngeal exudate, posterior oropharyngeal erythema or uvula swelling.     Tonsils: No tonsillar abscesses.  Eyes:     General:        Right eye: No discharge.        Left eye: No discharge.     Conjunctiva/sclera: Conjunctivae normal.  Cardiovascular:     Rate and Rhythm: Normal rate and regular rhythm.     Heart sounds: Normal heart sounds.  Pulmonary:     Effort: Pulmonary effort is normal. No respiratory distress.     Breath sounds: Normal breath sounds. No wheezing or rales.     Comments: Occasional cough during exam Abdominal:     Palpations: Abdomen is soft.     Tenderness: There is no abdominal tenderness.  Musculoskeletal:     Cervical back: Normal range of motion and neck supple.  Lymphadenopathy:     Cervical: No cervical adenopathy.  Skin:    General: Skin is warm and dry.  Neurological:     Mental Status: She is alert.  Psychiatric:        Mood and Affect: Mood normal.    ED Results / Procedures / Treatments   Labs (all labs ordered are listed, but only abnormal results are displayed) Labs Reviewed  RESP PANEL BY RT-PCR (FLU A&B, COVID) ARPGX2 - Abnormal; Notable for the following components:      Result Value   Influenza A by PCR POSITIVE (*)    All other components within normal limits    EKG None  Radiology DG Chest 1 View  Result Date: 11/26/2021 CLINICAL DATA:  Generalized body aches that started this morning. Recent COVID and flu vaccinations. EXAM: CHEST  1 VIEW COMPARISON:  Radiographs 10/16/2018. FINDINGS: 1245 hours. The heart size and mediastinal contours are normal. The lungs are clear. There is no pleural effusion or pneumothorax. No acute osseous findings are identified. IMPRESSION: Stable chest.  No active cardiopulmonary process. Electronically Signed   By: Richardean Sale M.D.   On: 11/26/2021 12:56    Procedures Procedures    Medications Ordered in  ED Medications  acetaminophen (TYLENOL) tablet 650 mg (has no administration in time range)    ED Course/ Medical Decision Making/ A&P                           Medical Decision Making Risk OTC drugs. Prescription drug management.   Patient seen and examined. History obtained directly from patient. Work-up including labs, imaging, EKG ordered in triage, if performed, were reviewed.    Labs/EKG: Independently reviewed and interpreted.  Respiratory panel negative for COVID, positive for flu A.  Imaging: Independently reviewed and interpreted.  This included: Chest x-ray without signs of pneumonia.  Medications/Fluids: Ordered: Tylenol  Most recent vital signs reviewed and are as follows: BP 99/68 (BP Location: Right Arm)    Pulse (!) 116    Temp 100.1 F (37.8 C) (Oral)    Resp (!) 24    SpO2 98%   Initial impression: Influenza A  Home treatment plan: Discussed risks and benefits of Tamiflu.  Patient's main risk factor in regards to her lungs is her smoking history.  Discussed side effects.  Patient would like prescription.  Will give Zofran if needed for nausea and vomiting.  Encouraged to rest and drink plenty of fluids.   Return instructions discussed with patient: Patient encouraged to return to ED or see their primary doctor if their symptoms worsen, high fever not controlled with tylenol, persistent vomiting, they feel they are dehydrated, or if they have other concerns.  Patient verbalized understanding and agreed with plan.    Follow-up instructions discussed with patient: Encourage PCP follow-up as needed.  2:35 PM patient was rechecked prior to discharge.  Temperature now 103 F.  Blood pressure was 91/53.  Patient is not having any lightheadedness or dizziness.  She is able to stand and walk without difficulty.  She is drinking water from a cup.  Discussed current options of being discharged and continue to hydrate well and treat symptoms at home versus additional  observation in the ED until temperature improves, given IV fluids.  Patient overall states that she feels well enough to go home.  States that her blood pressure typically runs low and she does not want anything additional at this time.  Again she appears nontoxic but feeling poorly due to her influenza.  Reviewed return instructions with patient at bedside.   Final Clinical Impression(s) / ED Diagnoses Final diagnoses:  Influenza A    Rx / DC Orders ED Discharge Orders          Ordered    oseltamivir (TAMIFLU) 75 MG capsule  Every 12 hours        11/26/21 1347    ondansetron (ZOFRAN-ODT) 4 MG disintegrating tablet  Every 8 hours PRN        11/26/21 1348              Carlisle Cater, PA-C 11/26/21 1402    Carlisle Cater, PA-C 11/26/21 Omar, [place], DO 11/28/21 [phone]

## 2021-12-03 DIAGNOSIS — Z1231 Encounter for screening mammogram for malignant neoplasm of breast: Secondary | ICD-10-CM

## 2021-12-14 ENCOUNTER — Other Ambulatory Visit: Payer: Self-pay

## 2021-12-14 ENCOUNTER — Ambulatory Visit: Payer: Medicaid Other | Admitting: Family Medicine

## 2021-12-14 VITALS — BP 101/59 | HR 120 | Temp 98.1°F | Ht 64.0 in | Wt 127.5 lb

## 2021-12-14 DIAGNOSIS — R000 Tachycardia, unspecified: Secondary | ICD-10-CM | POA: Diagnosis not present

## 2021-12-14 DIAGNOSIS — R052 Subacute cough: Secondary | ICD-10-CM | POA: Diagnosis not present

## 2021-12-14 DIAGNOSIS — I471 Supraventricular tachycardia: Secondary | ICD-10-CM | POA: Diagnosis not present

## 2021-12-14 MED ORDER — FLUTICASONE PROPIONATE 50 MCG/ACT NA SUSP: 2.0000 | Freq: Every day | NASAL | 0 refills | Status: DC

## 2021-12-14 MED ORDER — GUAIFENESIN-CODEINE 100-10 MG/5ML PO SOLN: 5.0000 mL | Freq: Four times a day (QID) | ORAL | 0 refills | Status: DC | PRN

## 2021-12-14 NOTE — Patient Instructions (Addendum)
It was a pleasure to see you today!  For your cough: you may use this cough syrup if you are not driving anywhere or at night as it will make you sleepy Also to help with cough: I recommend using flonase once in the each nostril in the morning to help with nasal congestion that can lead to cough by post nasal drip For your fast heart rate: this improved greatly with some water. I recommend drinking 1 to 1.5 liters of water per day. Follow up with your cardiologist if you notice your heart rate is still fast.  Be Well,  Dr. Chauncey Reading

## 2021-12-14 NOTE — Assessment & Plan Note (Addendum)
Notable tachycardia to 120 on exam today, asymptomatic. After drinking 10 oz of water, EKG shows NSR, HR improved to 83. Patient is compliant with metoprolol 12.5mg  BID. Counseled her on hydration, follow up with cardiology if persistent tachycardia, may need increase of metoprolol.

## 2021-12-14 NOTE — Assessment & Plan Note (Signed)
Patient has persistent, but improved, cough without purulence, no fever, after recent influenza infection. Pulmonary exam CTAB. Do not suspect superimposed bacterial infection given good exam. Will treat symptomatically with flonase for post nasal drip and guaifenicen and codeine to sleep at night. Follow up if fevers, no improvement in 4 weeks.

## 2021-12-14 NOTE — Progress Notes (Signed)
° ° °  SUBJECTIVE:   CHIEF COMPLAINT / HPI:   Primary symptom: Cough x3 weeks. Of note, patient had positive flu A on 11/26/21. She reports that her cough has improved, denies fever, chills, etc. She has some clear phlegm production. Her main concern is that she cannot sleep at night due to cough. No trouble tolerating fluids, no rhinorrhea, headache, sore throat, n/v/d. Sick contacts: yes, younger son in high school had flu Covid test: neg 11/26/21 Covid vaccination(s): x3 + flu vaccine this year  Tachycardia: patient has a history of tachycardia and SVT, started during pregnancy about 1.5 years ago. She follows with Dr. Gasper Sells, cardiology, is on metoprolol 12.5 mg BID. She took her metoprolol this morning, has not had any caffeine. She has tachycardia to 120 bpm in clinic. Will give her glass of water and get EKG. She denies palpitations, CP, SOB, dizziness.  PERTINENT  PMH / PSH: recent influenza, tobacco abuse  OBJECTIVE:   BP (!) 101/59    Pulse (!) 120    Temp 98.1 F (36.7 C) (Oral)    Ht 5\' 4"  (1.626 m)    Wt 127 lb 8 oz (57.8 kg)    LMP 11/20/2021    SpO2 98%    BMI 21.89 kg/m   Nursing note and vitals reviewed GEN: age appropriate, AAW, resting comfortably in chair, NAD, WNWD HEENT: NCAT. PERRLA. Sclera without injection or icterus. MMM. Clear oropharynx. Neck: Supple. No LAD Cardiac: tachycardic rate and regular rhythm. Normal S1/S2. No murmurs, rubs, or gallops appreciated. 2+ radial pulses. Lungs: Clear bilaterally to ascultation. No increased WOB, no accessory muscle usage. No w/r/r. Neuro: AOx3  Ext: no edema Psych: Pleasant and appropriate   ASSESSMENT/PLAN:   SVT (supraventricular tachycardia) (HCC) Notable tachycardia to 120 on exam today, asymptomatic. After drinking 10 oz of water, EKG shows NSR, HR improved to 83. Patient is compliant with metoprolol 12.5mg  BID. Counseled her on hydration, follow up with cardiology if persistent tachycardia, may need increase  of metoprolol.  Persistent cough for 3 weeks or longer Patient has persistent, but improved, cough without purulence, no fever, after recent influenza infection. Pulmonary exam CTAB. Do not suspect superimposed bacterial infection given good exam. Will treat symptomatically with flonase for post nasal drip and guaifenicen and codeine to sleep at night. Follow up if fevers, no improvement in 4 weeks.     Gladys Damme, MD [place]

## 2021-12-18 ENCOUNTER — Emergency Department (HOSPITAL_COMMUNITY)
Admission: EM | Admit: 2021-12-18 | Discharge: 2021-12-18 | Disposition: A | Discharge status: Home / Self Care | Payer: Medicaid Other | Attending: Emergency Medicine | Admitting: Emergency Medicine

## 2021-12-18 ENCOUNTER — Encounter (HOSPITAL_COMMUNITY): Payer: Self-pay | Admitting: Emergency Medicine

## 2021-12-18 ENCOUNTER — Other Ambulatory Visit: Payer: Self-pay

## 2021-12-18 DIAGNOSIS — X58XXXA Exposure to other specified factors, initial encounter: Secondary | ICD-10-CM | POA: Insufficient documentation

## 2021-12-18 DIAGNOSIS — S39012A Strain of muscle, fascia and tendon of lower back, initial encounter: Secondary | ICD-10-CM | POA: Insufficient documentation

## 2021-12-18 DIAGNOSIS — S34109A Unspecified injury to unspecified level of lumbar spinal cord, initial encounter: Secondary | ICD-10-CM | POA: Diagnosis present

## 2021-12-18 MED ORDER — OMEPRAZOLE 20 MG PO CPDR: 20.0000 mg | DELAYED_RELEASE_CAPSULE | Freq: Every day | ORAL | 0 refills | Status: DC

## 2021-12-18 MED ORDER — LIDOCAINE 4 % EX PTCH: 1.0000 | MEDICATED_PATCH | Freq: Two times a day (BID) | CUTANEOUS | 0 refills | Status: DC

## 2021-12-18 MED ORDER — LIDOCAINE 5 % EX PTCH
1.0000 | MEDICATED_PATCH | CUTANEOUS | Status: DC
  Administered 2021-12-18: 1 via TRANSDERMAL
  Filled 2021-12-18: qty 1

## 2021-12-18 MED ORDER — CYCLOBENZAPRINE HCL 10 MG PO TABS: 10.0000 mg | ORAL_TABLET | Freq: Two times a day (BID) | ORAL | 0 refills | Status: DC | PRN

## 2021-12-18 MED ORDER — MELOXICAM 7.5 MG PO TABS: 7.5000 mg | ORAL_TABLET | Freq: Every day | ORAL | 0 refills | Status: DC

## 2021-12-18 MED ORDER — CYCLOBENZAPRINE HCL 10 MG PO TABS
10.0000 mg | ORAL_TABLET | Freq: Once | ORAL | Status: AC
  Administered 2021-12-18: 10 mg via ORAL
  Filled 2021-12-18: qty 1

## 2021-12-18 MED ORDER — HYDROCODONE-ACETAMINOPHEN 5-325 MG PO TABS
1.0000 | ORAL_TABLET | Freq: Once | ORAL | Status: AC
  Administered 2021-12-18: 1 via ORAL
  Filled 2021-12-18: qty 1

## 2021-12-18 NOTE — ED Triage Notes (Signed)
Patient c/o lower back pain that started today, pain unrelieved with tylenol/motrin. Patient denies urinary s/s.

## 2021-12-18 NOTE — ED Provider Notes (Signed)
[place] DEPT Provider Note   CSN: [id number] Arrival date & time: 12/18/21  1853     History  Chief Complaint  Patient presents with   Back Pain    Lower back     Pam Vargas is a 41 y.o. female.  HPI    41 year old female comes in a chief complaint of back pain.  Indicates that she woke up with right-sided back pain this morning.  She has a 25 pound child that she thinks might have caused injury, but cannot think of any specific event.  Patient's pain is located over the lower right back, and it is worse with any kind of movement.  She has taken Tylenol and ibuprofen without significant relief.  She has had similar discomfort in the past and had improvement with Mobic and Flexeril.  Pt has no associated numbness, weakness, urinary incontinence, urinary retention, bowel incontinence, pins and needle sensation in the perineal area.   Home Medications Prior to Admission medications   Medication Sig Start Date End Date Taking? Authorizing Provider  cyclobenzaprine (FLEXERIL) 10 MG tablet Take 1 tablet (10 mg total) by mouth 2 (two) times daily as needed for muscle spasms. 12/18/21  Yes Rutledge Selsor, MD  Lidocaine 4 % PTCH Apply 1 patch topically 2 (two) times daily. 12/18/21  Yes Varney Biles, MD  meloxicam (MOBIC) 7.5 MG tablet Take 1 tablet (7.5 mg total) by mouth daily. 12/18/21  Yes Varney Biles, MD  omeprazole (PRILOSEC) 20 MG capsule Take 1 capsule (20 mg total) by mouth daily. 12/18/21  Yes Varney Biles, MD  amoxicillin-clavulanate (AUGMENTIN) 875-125 MG tablet Take 1 tablet by mouth every 12 (twelve) hours. 04/29/21   Pearson Forster, NP  benzonatate (TESSALON) 100 MG capsule Take 1 capsule (100 mg total) by mouth 3 (three) times daily as needed for cough. 07/26/21   Carvel Getting, NP  Blood Pressure Monitoring (BLOOD PRESSURE KIT) DEVI 1 Device by Does not apply route as needed. 05/27/20   Aletha Halim, MD  fluconazole  (DIFLUCAN) 150 MG tablet Take 1 tablet (150 mg total) by mouth daily. 06/03/21   White, Leitha Schuller, NP  fluticasone (FLONASE) 50 MCG/ACT nasal spray Place 2 sprays into both nostrils daily. 12/14/21   Gladys Damme, MD  guaiFENesin-codeine 100-10 MG/5ML syrup Take 5 mLs by mouth every 6 (six) hours as needed for cough. 12/14/21   Gladys Damme, MD  HYDROcodone-acetaminophen (NORCO/VICODIN) 5-325 MG tablet Take 1 tablet by mouth every 6 (six) hours as needed. 07/12/21   Caccavale, Sophia, PA-C  lidocaine (XYLOCAINE) 2 % solution Use as directed 15 mLs in the mouth or throat as needed for mouth pain. 04/29/21   Pearson Forster, NP  metoprolol tartrate (LOPRESSOR) 25 MG tablet Take 0.5 tablets (12.5 mg total) by mouth 2 (two) times daily. 10/27/21   Werner Lean, MD  metroNIDAZOLE (FLAGYL) 500 MG tablet Take 1 tablet (500 mg total) by mouth 2 (two) times daily. 06/07/21   Chase Picket, MD  ondansetron (ZOFRAN-ODT) 4 MG disintegrating tablet Take 1 tablet (4 mg total) by mouth every 8 (eight) hours as needed for nausea or vomiting. 11/26/21   Carlisle Cater, PA-C  oseltamivir (TAMIFLU) 75 MG capsule Take 1 capsule (75 mg total) by mouth every 12 (twelve) hours. 11/26/21   Carlisle Cater, PA-C  permethrin (ELIMITE) 5 % cream Apply to effected area at bedtime and wash off 8-14 hours later. Can repeat in 1 week if still symptomatic. 04/01/21  Richarda Osmond, MD  predniSONE (DELTASONE) 20 MG tablet Take 2 tablets (40 mg total) by mouth daily with breakfast. 10/13/21   Scot Jun, FNP  Prenatal Vit-Fe Phos-FA-Omega (VITAFOL GUMMIES) 3.33-0.333-34.8 MG CHEW CHEW 1 DOSE BY MOUTH IN THE MORNING, AT NOON, AND AT BEDTIME. 03/15/21   Mullis, Kiersten P, DO  tiZANidine (ZANAFLEX) 4 MG tablet Take 1 tablet (4 mg total) by mouth at bedtime. 10/13/21   Scot Jun, FNP  triamcinolone ointment (KENALOG) 0.5 % If rash not cleared with permethrin, use this twice per day to effected area for 1  week. 08/03/21   Lyndee Hensen, DO      Allergies    Patient has no known allergies.    Review of Systems   Review of Systems  All other systems reviewed and are negative.  Physical Exam Updated Vital Signs BP 125/67 (BP Location: Left Arm)    Pulse 75    Temp 98.2 F (36.8 C) (Oral)    Resp 18    Ht '5\' 4"'  (1.626 m)    Wt 57.8 kg    LMP 11/20/2021    SpO2 98%    BMI 21.87 kg/m  Physical Exam Vitals and nursing note reviewed.  Constitutional:      Appearance: She is well-developed.  HENT:     Head: Atraumatic.  Cardiovascular:     Rate and Rhythm: Normal rate.  Pulmonary:     Effort: Pulmonary effort is normal.  Musculoskeletal:     Cervical back: Normal range of motion and neck supple.     Comments: Reproducible tenderness over the lower right back, no significant spasms appreciated.  No midline spine tenderness  Skin:    General: Skin is warm and dry.  Neurological:     Mental Status: She is alert and oriented to person, place, and time.    ED Results / Procedures / Treatments   Labs (all labs ordered are listed, but only abnormal results are displayed) Labs Reviewed - No data to display  EKG None  Radiology No results found.  Procedures Procedures    Medications Ordered in ED Medications  HYDROcodone-acetaminophen (NORCO/VICODIN) 5-325 MG per tablet 1 tablet (has no administration in time range)  lidocaine (LIDODERM) 5 % 1 patch (has no administration in time range)  cyclobenzaprine (FLEXERIL) tablet 10 mg (has no administration in time range)    ED Course/ Medical Decision Making/ A&P                           Medical Decision Making Risk OTC drugs. Prescription drug management.   41 year old female comes in with chief complaint of back pain. She has history of back pain, and there is no specific trigger for this episode.  On exam, it appears to be a musculoskeletal pain.  Differential diagnosis includes lumbar strain, compression fracture,  pathologic fracture of the spine.  I consider getting x-ray of the spine, however based on exam, I feel very comfortable ruling out spine involvement and do not think there is need for x-ray of the spine.  Plan is to focus on symptom management.  Mobic prescribed along with Flexeril, lidocaine patch.  Patient is 100% sure she is not pregnant -she is trustworthy and reliable, we will not order necessary pregnancy test.  Final Clinical Impression(s) / ED Diagnoses Final diagnoses:  Strain of lumbar region, initial encounter    Rx / DC Orders ED Discharge Orders  Ordered    Lidocaine 4 % PTCH  2 times daily        12/18/21 2113    cyclobenzaprine (FLEXERIL) 10 MG tablet  2 times daily PRN        12/18/21 2113    meloxicam (MOBIC) 7.5 MG tablet  Daily        12/18/21 2113    omeprazole (PRILOSEC) 20 MG capsule  Daily        12/18/21 2113              Varney Biles, MD 12/18/21 2118

## 2021-12-18 NOTE — Discharge Instructions (Addendum)
You are seen in the ER for back pain.  Please take the medications prescribed Ensure that you are well-hydrated when taking Mobic and do not combine any other NSAIDs like ibuprofen with it.   See your primary care doctor if the pain continues beyond 5 days.

## 2021-12-18 NOTE — ED Notes (Signed)
Provider at bedside

## 2021-12-18 NOTE — ED Notes (Signed)
Patient lying down, respirations even and unlabored.  Patient currently awaiting assessment by provider.

## 2021-12-24 ENCOUNTER — Ambulatory Visit
Admission: RE | Admit: 2021-12-24 | Discharge: 2021-12-24 | Disposition: A | Discharge status: Home / Self Care | Payer: Medicaid Other | Source: Ambulatory Visit

## 2021-12-24 DIAGNOSIS — Z1231 Encounter for screening mammogram for malignant neoplasm of breast: Secondary | ICD-10-CM

## 2021-12-27 ENCOUNTER — Other Ambulatory Visit: Payer: Self-pay

## 2021-12-27 ENCOUNTER — Other Ambulatory Visit: Payer: Self-pay | Admitting: *Deleted

## 2021-12-27 NOTE — Patient Instructions (Signed)
Visit Information  Ms. Dente was given information about Medicaid Managed Care team care coordination services as a part of their Healthy [facility] Medicaid benefit. Julaine Zimny Feuerborn verbally consented to engagement with the [facility] Managed Care team.   If you are experiencing a medical emergency, please call 911 or report to your local emergency department or urgent care.   If you have a non-emergency medical problem during routine business hours, please contact your provider's office and ask to speak with a nurse.   For questions related to your Healthy [facility] health plan, please call: [phone] or visit the homepage here: [email or web address]  If you would like to schedule transportation through your Healthy [facility] plan, please call the following number at least 2 days in advance of your appointment: [phone]  For information about your ride after you set it up, call Ride Assist at [phone]. Use this number to activate a Will Call pickup, or if your transportation is late for a scheduled pickup. Use this number, too, if you need to make a change or cancel a previously scheduled reservation.  If you need transportation services right away, call [phone]. The after-hours call center is staffed 24 hours to handle ride assistance and urgent reservation requests (including discharges) 365 days a year. Urgent trips include sick visits, hospital discharge requests and life-sustaining treatment.  Call the [place] at [phone], at any time, 24 hours a day, 7 days a week. If you are in danger or need immediate medical attention call 911.  If you would like help to quit smoking, call 1-800-QUIT-NOW 818 669 5702) OR Espaol: 1-855-Djelo-Ya (1-287-867-6720) o para ms informacin haga clic aqu or Text READY to 200-400 to register via text  Ms. Kyllonen,   Please see education materials related to smoking  cessation provided by MyChart link.  Patient verbalizes understanding of instructions and care plan provided today and agrees to view in [place]. Active MyChart status confirmed with patient.    Telephone follow up appointment with Managed Medicaid care management team member scheduled for:01/26/22 @ 2:30pm  Lurena Joiner RN, BSN [place] RN Care Coordinator   Following is a copy of your plan of care:  Care Plan : RN Care Manager Plan of Care  Updates made by Melissa Montane, RN since 12/27/2021 12:00 AM     Problem: Health Management needs related to Smoking Cessation      Long-Range Goal: Development of Plan of Care to address Health Management needs related to Smoking Cessation   Start Date: 12/27/2021  Expected End Date: 02/25/2022  Priority: High  Note:   Current Barriers:  Care Coordination needs related to support for smoking cessation   RNCM Clinical Goal(s):  Patient will verbalize understanding of plan for management of Tobacco Use as evidenced by patient verbalization of self monitoring activities attend all scheduled medical appointments: scheduling with Dental provider and PCP for annual physical as evidenced by provider documentation in EMR           Interventions: Inter-disciplinary care team collaboration (see longitudinal plan of care) Evaluation of current treatment plan related to  self management and patient's adherence to plan as established by provider Care Guide referral for Dental providers accepting Medicaid and assistance with utilities   Smoking Cessation: (Status: New goal.) Long Term Goal  Reviewed smoking history:  tobacco abuse of many years; currently smoking .25 ppd Previous quit attempts, unsuccessful successful using in 2006 for 2 months, started back after her  husband was in a bad accident  Reports smoking within 30 minutes of waking up Reports triggers to smoke include: meals Reports motivation to quit smoking  includes: children On a scale of 1-10, reports MOTIVATION to quit is 8 On a scale of 1-10, reports CONFIDENCE in quitting is 8   Advised patient to discuss smoking cessation options with provider; Provided contact information for [facility] (1-800-QUIT-NOW); Reviewed smoking cessation techniques: substitution of other forms of reinforcement and support of family/friends Provided patient with printed smoking cessation educational materials; Reviewed scheduled/upcoming provider appointments including: scheduling annual physical with PCP; Discussed plans with patient for ongoing care management follow up and provided patient with direct contact information for care management team;  Patient Goals/Self-Care Activities: Take medications as prescribed   Attend all scheduled provider appointments Call provider office for new concerns or questions

## 2021-12-27 NOTE — Patient Outreach (Signed)
Medicaid Managed Care   Nurse Care Manager Note  12/27/2021 Name:  Pam Vargas MRN:  [id number] DOB:  [birth year]  Pam Vargas is an 41 y.o. year old female who is a primary patient of Sowell, Erlene Quan, MD.  The [facility] Managed Care Coordination team was consulted for assistance with:    Smoking cessation  Ms. Sedano was given information about Medicaid Managed Care Coordination team services today. Pam Vargas Patient agreed to services and verbal consent obtained.  Engaged with patient by telephone for initial visit in response to provider referral for case management and/or care coordination services.   Assessments/Interventions:  Review of past medical history, allergies, medications, health status, including review of consultants reports, laboratory and other test data, was performed as part of comprehensive evaluation and provision of chronic care management services.  SDOH (Social Determinants of Health) assessments and interventions performed: SDOH Interventions    Flowsheet Row Most Recent Value  SDOH Interventions   Food Insecurity Interventions Intervention Not Indicated  Housing Interventions Intervention Not Indicated  Transportation Interventions Intervention Not Indicated       Care Plan  No Known Allergies  Medications Reviewed Today     Reviewed by Melissa Montane, RN (Registered Nurse) on 12/27/21 at 60  Med List Status: <None>   Medication Order Taking? Sig Documenting Provider Last Dose Status Informant  amoxicillin-clavulanate (AUGMENTIN) 875-125 MG tablet [id number] No Take 1 tablet by mouth every 12 (twelve) hours.  Patient not taking: Reported on 12/27/2021   Pearson Forster, NP Not Taking Consider Medication Status and Discontinue (Completed Course)   benzonatate (TESSALON) 100 MG capsule [id number] No Take 1 capsule (100 mg total) by mouth 3 (three) times daily as needed for cough.  Patient not taking: Reported on 12/27/2021   Carvel Getting, NP Not Taking Consider Medication Status and Discontinue (Completed Course)   Blood Pressure Monitoring (BLOOD PRESSURE KIT) DEVI [id number] Yes 1 Device by Does not apply route as needed. Aletha Halim, MD Taking Active Self  cyclobenzaprine (FLEXERIL) 10 MG tablet [id number] No Take 1 tablet (10 mg total) by mouth 2 (two) times daily as needed for muscle spasms.  Patient not taking: Reported on 12/27/2021   Varney Biles, MD Not Taking Active   fluconazole (DIFLUCAN) 150 MG tablet [id number] No Take 1 tablet (150 mg total) by mouth daily.  Patient not taking: Reported on 12/27/2021   Hans Eden, NP Not Taking Consider Medication Status and Discontinue (Completed Course)   fluticasone (FLONASE) 50 MCG/ACT nasal spray [id number] Yes Place 2 sprays into both nostrils daily. Gladys Damme, MD Taking Active   guaiFENesin-codeine 100-10 MG/5ML syrup [id number] No Take 5 mLs by mouth every 6 (six) hours as needed for cough.  Patient not taking: Reported on 12/27/2021   Gladys Damme, MD Not Taking Active   HYDROcodone-acetaminophen (NORCO/VICODIN) 5-325 MG tablet [id number] No Take 1 tablet by mouth every 6 (six) hours as needed.  Patient not taking: Reported on 12/27/2021   Franchot Heidelberg, PA-C Not Taking Active   lidocaine (XYLOCAINE) 2 % solution [id number] No Use as directed 15 mLs in the mouth or throat as needed for mouth pain.  Patient not taking: Reported on 12/27/2021   Pearson Forster, NP Not Taking Active   Lidocaine 4 % PTCH [id number] No Apply 1 patch topically 2 (two) times daily.  Patient not taking: Reported on 12/27/2021   Varney Biles, MD Not Taking Active   meloxicam (MOBIC) 7.5 MG tablet [id number] No  Take 1 tablet (7.5 mg total) by mouth daily.  Patient not taking: Reported on 12/27/2021   Varney Biles, MD Not Taking Active            Med Note (Naomie Crow A   Mon Dec 27, 2021  2:22 PM) completed  metoprolol tartrate (LOPRESSOR) 25 MG tablet [id number]  Yes Take 0.5 tablets (12.5 mg total) by mouth 2 (two) times daily. Werner Lean, MD Taking Active   metroNIDAZOLE (FLAGYL) 500 MG tablet [id number] No Take 1 tablet (500 mg total) by mouth 2 (two) times daily.  Patient not taking: Reported on 12/27/2021   Chase Picket, MD Not Taking Active            Med Note Thamas Jaegers, Sariah Henkin A   Mon Dec 27, 2021  2:22 PM) completed  omeprazole (PRILOSEC) 20 MG capsule [id number] No Take 1 capsule (20 mg total) by mouth daily.  Patient not taking: Reported on 12/27/2021   Varney Biles, MD Not Taking Active   ondansetron (ZOFRAN-ODT) 4 MG disintegrating tablet [id number] No Take 1 tablet (4 mg total) by mouth every 8 (eight) hours as needed for nausea or vomiting.  Patient not taking: Reported on 12/27/2021   Carlisle Cater, PA-C Not Taking Active   oseltamivir (TAMIFLU) 75 MG capsule [id number] No Take 1 capsule (75 mg total) by mouth every 12 (twelve) hours.  Patient not taking: Reported on 12/27/2021   Carlisle Cater, PA-C Not Taking Active   permethrin (ELIMITE) 5 % cream [id number] Yes Apply to effected area at bedtime and wash off 8-14 hours later. Can repeat in 1 week if still symptomatic. Richarda Osmond, MD Taking Active   predniSONE (DELTASONE) 20 MG tablet [id number] No Take 2 tablets (40 mg total) by mouth daily with breakfast.  Patient not taking: Reported on 12/27/2021   Scot Jun, FNP Not Taking Active            Med Note (Romyn Boswell A   Mon Dec 27, 2021  2:24 PM) completed  Prenatal Vit-Fe Phos-FA-Omega (VITAFOL GUMMIES) 3.33-0.333-34.8 MG CHEW [id number] Yes CHEW 1 DOSE BY MOUTH IN THE MORNING, AT NOON, AND AT BEDTIME. Mullis, Kiersten P, DO Taking Active   tiZANidine (ZANAFLEX) 4 MG tablet [id number] No Take 1 tablet (4 mg total) by mouth at bedtime.  Patient not taking: Reported on 12/27/2021   Scot Jun, FNP Not Taking Active   triamcinolone ointment (KENALOG) 0.5 % [id number] No If rash not cleared with  permethrin, use this twice per day to effected area for 1 week.  Patient not taking: Reported on 12/27/2021   Lyndee Hensen, DO Not Taking Active             Patient Active Problem List   Diagnosis Date Noted   Gingivitis 04/01/2021   Rash of both hands 04/01/2021   Bacterial vaginitis 03/17/2021   Dyshidrotic eczema 02/24/2021   SVT (supraventricular tachycardia) (HCC)    Alpha thalassemia silent carrier 06/19/2020   Single nabothian cyst 06/03/2020   Subchorionic hemorrhage in first trimester 05/13/2020   Dry skin 03/23/2020   Benign cyst of breast 06/05/2017   History of preterm delivery 09/12/2016   Previous cesarean section 09/12/2016   Screen for sexually transmitted diseases 06/03/2011   TOBACCO USER 11/02/2009   Persistent cough for 3 weeks or longer 10/03/2008    Conditions to be addressed/monitored per PCP order:   smoking cessation  Care Plan : RN Care Manager Plan of Care  Updates  made by Melissa Montane, RN since 12/27/2021 12:00 AM     Problem: Health Management needs related to Smoking Cessation      Long-Range Goal: Development of Plan of Care to address Health Management needs related to Smoking Cessation   Start Date: 12/27/2021  Expected End Date: 02/25/2022  Priority: High  Note:   Current Barriers:  Care Coordination needs related to support for smoking cessation   RNCM Clinical Goal(s):  Patient will verbalize understanding of plan for management of Tobacco Use as evidenced by patient verbalization of self monitoring activities attend all scheduled medical appointments: scheduling with Dental provider and PCP for annual physical as evidenced by provider documentation in EMR           Interventions: Inter-disciplinary care team collaboration (see longitudinal plan of care) Evaluation of current treatment plan related to  self management and patient's adherence to plan as established by provider Care Guide referral for Dental providers accepting  Medicaid and assistance with utilities   Smoking Cessation: (Status: New goal.) Long Term Goal  Reviewed smoking history:  tobacco abuse of many years; currently smoking .25 ppd Previous quit attempts, unsuccessful successful using in 2006 for 2 months, started back after her husband was in a bad accident  Reports smoking within 30 minutes of waking up Reports triggers to smoke include: meals Reports motivation to quit smoking includes: children On a scale of 1-10, reports MOTIVATION to quit is 8 On a scale of 1-10, reports CONFIDENCE in quitting is 8   Advised patient to discuss smoking cessation options with provider; Provided contact information for [place] (1-800-QUIT-NOW); Reviewed smoking cessation techniques: substitution of other forms of reinforcement and support of family/friends Provided patient with printed smoking cessation educational materials; Reviewed scheduled/upcoming provider appointments including: scheduling annual physical with PCP; Discussed plans with patient for ongoing care management follow up and provided patient with direct contact information for care management team;  Patient Goals/Self-Care Activities: Take medications as prescribed   Attend all scheduled provider appointments Call provider office for new concerns or questions        Follow Up:  Patient agrees to Care Plan and Follow-up.  Plan: The Managed Medicaid care management team will reach out to the patient again over the next 30 days.  Date/time of next scheduled RN care management/care coordination outreach:  01/26/22 @ 2:30pm  Lurena Joiner RN, BSN [place] RN Care Coordinator

## 2022-01-12 ENCOUNTER — Other Ambulatory Visit: Payer: Self-pay | Admitting: Student

## 2022-01-12 MED ORDER — NICOTINE 7 MG/24HR TD PT24: 7.0000 mg | MEDICATED_PATCH | Freq: Every day | TRANSDERMAL | 0 refills | Status: DC

## 2022-01-12 NOTE — Progress Notes (Signed)
Patient wanting to start nicotine patches, smoking 5 cigarettes a day. Received message about patient from Lurena Joiner RN, and also advised patient to schedule annual physical ?

## 2022-01-13 ENCOUNTER — Encounter (HOSPITAL_COMMUNITY): Payer: Self-pay

## 2022-01-13 ENCOUNTER — Emergency Department (HOSPITAL_COMMUNITY)
Admission: EM | Admit: 2022-01-13 | Discharge: 2022-01-13 | Disposition: A | Discharge status: Home / Self Care | Payer: Medicaid Other | Attending: Emergency Medicine | Admitting: Emergency Medicine

## 2022-01-13 ENCOUNTER — Other Ambulatory Visit: Payer: Self-pay

## 2022-01-13 DIAGNOSIS — X501XXA Overexertion from prolonged static or awkward postures, initial encounter: Secondary | ICD-10-CM | POA: Insufficient documentation

## 2022-01-13 DIAGNOSIS — S34109A Unspecified injury to unspecified level of lumbar spinal cord, initial encounter: Secondary | ICD-10-CM | POA: Diagnosis present

## 2022-01-13 DIAGNOSIS — M79642 Pain in left hand: Secondary | ICD-10-CM | POA: Diagnosis not present

## 2022-01-13 DIAGNOSIS — S39012A Strain of muscle, fascia and tendon of lower back, initial encounter: Secondary | ICD-10-CM | POA: Diagnosis not present

## 2022-01-13 MED ORDER — CYCLOBENZAPRINE HCL 10 MG PO TABS: 10.0000 mg | ORAL_TABLET | Freq: Two times a day (BID) | ORAL | 0 refills | Status: DC | PRN

## 2022-01-13 MED ORDER — IBUPROFEN 600 MG PO TABS: 600.0000 mg | ORAL_TABLET | Freq: Four times a day (QID) | ORAL | 0 refills | Status: DC | PRN

## 2022-01-13 NOTE — ED Notes (Signed)
An After Visit Summary was printed and given to the patient. Discharge instructions given and no further questions at this time.  

## 2022-01-13 NOTE — ED Triage Notes (Signed)
Pt c/o back pain and left hand pain x2 days. Pt denies trauma/injury.  ?

## 2022-01-13 NOTE — ED Provider Notes (Signed)
?[facility] DEPT ?Provider Note ? ? ?CSN: [id number] ?Arrival date & time: 01/13/22  1843 ? ?  ? ?History ? ?Chief Complaint  ?Patient presents with  ? Back Pain  ? Left Hand Pain  ? ? ?Pam Vargas is a 41 y.o. female. ? ?The history is provided by the patient. No language interpreter was used.  ?Back Pain ? ?41 year old female significant history of anemia, cervical mass, SVT, complaining of back pain and finger pain.  Patient report for the past 2 days she has noticed pain to her left lower back as well as her left hand.  She described pain as a throbbing achy sensation, worsening with movement mild to moderate severity.  She denies any associated fever or chills no abdominal pain no dysuria or hematuria no numbness or weakness.  She denies any recent injury.  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  She is unsure if it may have contributed to her symptoms.  She is right-hand dominant.  No history of shingles.  No specific treatment tried. ? ?Home Medications ?Prior to Admission medications   ?Medication Sig Start Date End Date Taking? Authorizing Provider  ?amoxicillin-clavulanate (AUGMENTIN) 875-125 MG tablet Take 1 tablet by mouth every 12 (twelve) hours. ?Patient not taking: Reported on 12/27/2021 04/29/21   Pearson Forster, NP  ?benzonatate (TESSALON) 100 MG capsule Take 1 capsule (100 mg total) by mouth 3 (three) times daily as needed for cough. ?Patient not taking: Reported on 12/27/2021 07/26/21   Carvel Getting, NP  ?Blood Pressure Monitoring (BLOOD PRESSURE KIT) DEVI 1 Device by Does not apply route as needed. 05/27/20   Aletha Halim, MD  ?cyclobenzaprine (FLEXERIL) 10 MG tablet Take 1 tablet (10 mg total) by mouth 2 (two) times daily as needed for muscle spasms. ?Patient not taking: Reported on 12/27/2021 12/18/21   Varney Biles, MD  ?fluconazole (DIFLUCAN) 150 MG tablet Take 1 tablet  (150 mg total) by mouth daily. ?Patient not taking: Reported on 12/27/2021 06/03/21   Hans Eden, NP  ?fluticasone (FLONASE) 50 MCG/ACT nasal spray Place 2 sprays into both nostrils daily. 12/14/21   Gladys Damme, MD  ?guaiFENesin-codeine 100-10 MG/5ML syrup Take 5 mLs by mouth every 6 (six) hours as needed for cough. ?Patient not taking: Reported on 12/27/2021 12/14/21   Gladys Damme, MD  ?HYDROcodone-acetaminophen (NORCO/VICODIN) 5-325 MG tablet Take 1 tablet by mouth every 6 (six) hours as needed. ?Patient not taking: Reported on 12/27/2021 07/12/21   Caccavale, Sophia, PA-C  ?lidocaine (XYLOCAINE) 2 % solution Use as directed 15 mLs in the mouth or throat as needed for mouth pain. ?Patient not taking: Reported on 12/27/2021 04/29/21   Pearson Forster, NP  ?Lidocaine 4 % PTCH Apply 1 patch topically 2 (two) times daily. ?Patient not taking: Reported on 12/27/2021 12/18/21   Varney Biles, MD  ?meloxicam (MOBIC) 7.5 MG tablet Take 1 tablet (7.5 mg total) by mouth daily. ?Patient not taking: Reported on 12/27/2021 12/18/21   Varney Biles, MD  ?metoprolol tartrate (LOPRESSOR) 25 MG tablet Take 0.5 tablets (12.5 mg total) by mouth 2 (two) times daily. 10/27/21   Werner Lean, MD  ?metroNIDAZOLE (FLAGYL) 500 MG tablet Take 1 tablet (500 mg total) by mouth 2 (two) times daily. ?Patient not taking: Reported on 12/27/2021 06/07/21   Chase Picket, MD  ?nicotine (NICODERM CQ - DOSED IN MG/24 HR) 7 mg/24hr  patch Place 1 patch (7 mg total) onto the skin daily. 01/12/22   Holley Bouche, MD  ?omeprazole (PRILOSEC) 20 MG capsule Take 1 capsule (20 mg total) by mouth daily. ?Patient not taking: Reported on 12/27/2021 12/18/21   Varney Biles, MD  ?ondansetron (ZOFRAN-ODT) 4 MG disintegrating tablet Take 1 tablet (4 mg total) by mouth every 8 (eight) hours as needed for nausea or vomiting. ?Patient not taking: Reported on 12/27/2021 11/26/21   Carlisle Cater, PA-C  ?oseltamivir (TAMIFLU) 75 MG capsule Take 1  capsule (75 mg total) by mouth every 12 (twelve) hours. ?Patient not taking: Reported on 12/27/2021 11/26/21   Carlisle Cater, PA-C  ?permethrin (ELIMITE) 5 % cream Apply to effected area at bedtime and wash off 8-14 hours later. Can repeat in 1 week if still symptomatic. 04/01/21   Richarda Osmond, MD  ?predniSONE (DELTASONE) 20 MG tablet Take 2 tablets (40 mg total) by mouth daily with breakfast. ?Patient not taking: Reported on 12/27/2021 10/13/21   Scot Jun, FNP  ?Prenatal Vit-Fe Phos-FA-Omega (VITAFOL GUMMIES) 3.33-0.333-34.8 MG CHEW CHEW 1 DOSE BY MOUTH IN THE MORNING, AT NOON, AND AT BEDTIME. 03/15/21   Mullis, Kiersten P, DO  ?tiZANidine (ZANAFLEX) 4 MG tablet Take 1 tablet (4 mg total) by mouth at bedtime. ?Patient not taking: Reported on 12/27/2021 10/13/21   Scot Jun, FNP  ?triamcinolone ointment (KENALOG) 0.5 % If rash not cleared with permethrin, use this twice per day to effected area for 1 week. ?Patient not taking: Reported on 12/27/2021 08/03/21   Lyndee Hensen, DO  ?   ? ?Allergies    ?Patient has no known allergies.   ? ?Review of Systems   ?Review of Systems  ?Musculoskeletal:  Positive for back pain.  ?All other systems reviewed and are negative. ? ?Physical Exam ?Updated Vital Signs ?BP 125/72 (BP Location: Left Arm)   Pulse 93   Temp 98 ?F (36.7 ?C) (Oral)   Resp 18   SpO2 100%  ?Physical Exam ?Vitals and nursing note reviewed.  ?Constitutional:   ?   General: She is not in acute distress. ?   Appearance: She is well-developed.  ?HENT:  ?   Head: Atraumatic.  ?Eyes:  ?   Conjunctiva/sclera: Conjunctivae normal.  ?Pulmonary:  ?   Effort: Pulmonary effort is normal.  ?Abdominal:  ?   Palpations: Abdomen is soft.  ?   Tenderness: There is no abdominal tenderness.  ?Musculoskeletal:     ?   General: Tenderness (Mild tenderness to left lumbar paraspinal muscle on palpation.  No overlying skin changes.  No midline spine tenderness.  No CVA tenderness) present.  ?   Cervical  back: Neck supple.  ?   Comments: Left hand: Mild tenderness along the hypothenar eminence without any rash or overlying skin changes.  Pinky with chronic deformity to the PIP, unable to fully extend finger however this is not new.  ?Skin: ?   Findings: No rash.  ?Neurological:  ?   Mental Status: She is alert.  ?Psychiatric:     ?   Mood and Affect: Mood normal.  ? ? ?ED Results / Procedures / Treatments   ?Labs ?(all labs ordered are listed, but only abnormal results are displayed) ?Labs Reviewed - No data to display ? ?EKG ?None ? ?Radiology ?No results found. ? ?Procedures ?Procedures  ? ? ?Medications Ordered in ED ?Medications - No data to display ? ?ED Course/ Medical Decision Making/ A&P ?  ?                        ?  Medical Decision Making ? ?BP 125/72 (BP Location: Left Arm)   Pulse 93   Temp 98 ?F (36.7 ?C) (Oral)   Resp 18   SpO2 100%  ? ?7:22 PM ?Patient here with reproducible pain to her left lumbar paraspinal muscle as well as a left hand along the hypothenar eminence.  There are no signs of infection on exam.  She does not have any urinary symptoms or CVA tenderness to suggest kidney stone.  No signs of rash to suggest shingles.  At this time, I suspect this is likely MSK pain.  I have low suspicion for infection.  I do not feel imaging is indicated at this time.  Will provide symptomatic treatment as needed and return precaution given ? ? ? ? ? ? ? ?Final Clinical Impression(s) / ED Diagnoses ?Final diagnoses:  ?Strain of lumbar region, initial encounter  ?Left hand pain  ? ? ?Rx / DC Orders ?ED Discharge Orders   ? ?      Ordered  ?  ibuprofen (ADVIL) 600 MG tablet  Every 6 hours PRN       ? 01/13/22 1925  ?  cyclobenzaprine (FLEXERIL) 10 MG tablet  2 times daily PRN       ? 01/13/22 1925  ? ?  ?  ? ?  ? ? ?  ?Domenic Moras, PA-C ?01/13/22 1927 ? ?  ?Sherwood Gambler, MD ?01/13/22 2259 ? ?

## 2022-01-13 NOTE — Discharge Instructions (Addendum)
Your pain is likely due to muscle strain.  I do not see any signs of infection on exam.  Take medication prescribed as needed.  If you develop fever, rash, urinating blood or having trouble urinating, please return for further assessment. ?

## 2022-01-26 ENCOUNTER — Other Ambulatory Visit: Payer: Self-pay | Admitting: *Deleted

## 2022-01-26 NOTE — Patient Outreach (Signed)
Care Coordination ? ?01/26/2022 ? ?Pam Vargas ?[birth year] ?[id number] ? ? ?Medicaid Managed Care  ? ?Unsuccessful Outreach Note ? ?01/26/2022 ?Name: Pam Vargas MRN: [id number] DOB: September 29, 1981 ? ?Referred by: Holley Bouche, MD ?Reason for referral : High Risk Managed Medicaid (Unsuccessful RNCM follow up telephone outreach) ? ? ?An unsuccessful telephone outreach was attempted today. The patient was referred to the case management team for assistance with care management and care coordination.  ? ?Follow Up Plan: A HIPAA compliant phone message was left for the patient providing contact information and requesting a return call.  ? ?Lurena Joiner RN, BSN ?[facility] ?RN Care Coordinator ? ? ?

## 2022-01-26 NOTE — Patient Instructions (Signed)
Visit Information ? ?[name]  - as a part of your Medicaid benefit, you are eligible for care management and care coordination services at no cost or copay. I was unable to reach you by phone today but would be happy to help you with your health related needs. Please feel free to call me @ [phone].  ? ?A member of the Managed Medicaid care management team will reach out to you again over the next 14 days.  ? ?Lurena Joiner RN, BSN ?[place] ?RN Care Coordinator ?  ?

## 2022-01-31 ENCOUNTER — Other Ambulatory Visit: Payer: Self-pay | Admitting: *Deleted

## 2022-01-31 NOTE — Patient Instructions (Signed)
Visit Information ? ?[name]  - as a part of your Medicaid benefit, you are eligible for care management and care coordination services at no cost or copay. I was unable to reach you by phone today but would be happy to help you with your health related needs. Please feel free to call me @ [phone].  ? ?A member of the Managed Medicaid care management team will reach out to you again over the next 14 days.  ? ?Lurena Joiner RN, BSN ?[place] ?RN Care Coordinator ?  ?

## 2022-01-31 NOTE — Patient Outreach (Signed)
Care Coordination ? ?01/31/2022 ? ?Pam Vargas ?08-Feb-1981 ?[id number] ? ? ?Medicaid Managed Care  ? ?Unsuccessful Outreach Note ? ?01/31/2022 ?Name: Pam Vargas MRN: [id number] DOB: Feb 25, 1981 ? ?Referred by: Holley Bouche, MD ?Reason for referral : High Risk Managed Medicaid (Unsuccessful RNCM follow up telephone outreach) ? ? ?A second unsuccessful telephone outreach was attempted today. The patient was referred to the case management team for assistance with care management and care coordination.  ? ?Follow Up Plan: A HIPAA compliant phone message was left for the patient providing contact information and requesting a return call.  ? ?Lurena Joiner RN, BSN ?[place] ?RN Care Coordinator ? ? ?

## 2022-02-01 ENCOUNTER — Telehealth: Payer: Self-pay | Admitting: Student

## 2022-02-01 NOTE — Telephone Encounter (Signed)
.. ?  Medicaid Managed Care  ? ?Unsuccessful Outreach Note ? ?02/01/2022 ?Name: Pam Vargas MRN: [id number] DOB: 1981-06-29 ? ?Referred by: Holley Bouche, MD ?Reason for referral : High Risk Managed Medicaid (I called the patient today to get her rescheduled with the MM RNCM. I left my name and number on her VM.) ? ? ?An unsuccessful telephone outreach was attempted today. The patient was referred to the case management team for assistance with care management and care coordination.  ? ?Follow Up Plan: The care management team will reach out to the patient again over the next 7 days.  ? ?Reita Chard ?Care Guide, High Risk Medicaid Managed Care ?Embedded Care Coordination ?[place]  ? ? ? ?

## 2022-02-08 ENCOUNTER — Other Ambulatory Visit: Payer: Medicaid Other | Admitting: *Deleted

## 2022-02-08 ENCOUNTER — Telehealth: Payer: Self-pay | Admitting: Student

## 2022-02-08 NOTE — Patient Outreach (Signed)
Care Coordination ? ?02/08/2022 ? ?[name] ?07-Oct-1981 ?[id number] ? ? ?Medicaid Managed Care  ? ?Unsuccessful Outreach Note ? ?02/08/2022 ?Name: Pam Vargas MRN: [id number] DOB: [birth year] ? ?Referred by: Holley Bouche, MD ?Reason for referral : Case Closure (RNCM performing Case Closure for 3 or more unsuccessful outreach attempts) ? ? ?Three unsuccessful telephone outreach attempts have been made. The patient was referred to the case management team for assistance with care management and care coordination. The patient's primary care provider has been notified of our unsuccessful attempts to make or maintain contact with the patient. The care management team is pleased to engage with this patient at any time in the future should he/she be interested in assistance from the care management team.  ? ?Follow Up Plan: We have been unable to make contact with the patient for follow up. The care management team is available to follow up with the patient after provider conversation with the patient regarding recommendation for care management engagement and subsequent re-referral to the care management team.  ? ?Lurena Joiner RN, BSN ?[place] ?RN Care Coordinator ? ? ?

## 2022-02-08 NOTE — Telephone Encounter (Signed)
.. ?  Medicaid Managed Care  ? ?Unsuccessful Outreach Note ? ?02/08/2022 ?Name: Pam Vargas MRN: [id number] DOB: [birth year] ? ?Referred by: Holley Bouche, MD ?Reason for referral : High Risk Managed Medicaid (I called the patient today to get her rescheduled with the MM RNCM. I had to leave another message on her VM.) ? ? ?Third unsuccessful telephone outreach was attempted today. The patient was referred to the case management team for assistance with care management and care coordination. The patient's primary care provider has been notified of our unsuccessful attempts to make or maintain contact with the patient. The care management team is pleased to engage with this patient at any time in the future should he/she be interested in assistance from the care management team.  ? ?Follow Up Plan: We have been unable to make contact with the patient for follow up. The care management team is available to follow up with the patient after provider conversation with the patient regarding recommendation for care management engagement and subsequent re-referral to the care management team.  ? ? ?Reita Chard ?Care Guide, High Risk Medicaid Managed Care ?Embedded Care Coordination ?[place]  ? ? ?SIGNATURE  ?

## 2022-02-18 ENCOUNTER — Telehealth: Payer: Self-pay

## 2022-02-18 ENCOUNTER — Ambulatory Visit: Payer: Medicaid Other | Admitting: Family Medicine

## 2022-02-18 VITALS — BP 103/54 | HR 89 | Ht 64.0 in | Wt 130.2 lb

## 2022-02-18 DIAGNOSIS — L301 Dyshidrosis [pompholyx]: Secondary | ICD-10-CM

## 2022-02-18 DIAGNOSIS — D649 Anemia, unspecified: Secondary | ICD-10-CM | POA: Diagnosis not present

## 2022-02-18 DIAGNOSIS — L309 Dermatitis, unspecified: Secondary | ICD-10-CM | POA: Diagnosis not present

## 2022-02-18 DIAGNOSIS — L245 Irritant contact dermatitis due to other chemical products: Secondary | ICD-10-CM

## 2022-02-18 DIAGNOSIS — D563 Thalassemia minor: Secondary | ICD-10-CM

## 2022-02-18 MED ORDER — DESONIDE 0.05 % EX LOTN: TOPICAL_LOTION | Freq: Two times a day (BID) | CUTANEOUS | 0 refills | Status: DC

## 2022-02-18 MED ORDER — FLUOCINOLONE ACETONIDE BODY 0.01 % EX OIL: 1.0000 [drp] | TOPICAL_OIL | Freq: Two times a day (BID) | CUTANEOUS | 0 refills | Status: DC

## 2022-02-18 MED ORDER — BETAMETHASONE VALERATE 0.1 % EX CREA: TOPICAL_CREAM | Freq: Two times a day (BID) | CUTANEOUS | 0 refills | Status: DC

## 2022-02-18 NOTE — Assessment & Plan Note (Addendum)
-  Symptoms and history consistent with continued hand eczema.  ?-Prescribed Betamethasone for use between fingers and referred to dermatology. Advised to keep hands moisturized with vaseline and dry cotton gloves.  ? ?- For patch of dry itchy skin on face, this is likely eczema given history of eczema and appearance. Prescribed desonide for use on face, and referred to dermatology. ? ?

## 2022-02-18 NOTE — Progress Notes (Addendum)
?  Date of Visit: 02/18/2022  ? ?SUBJECTIVE:  ? ?HPI: ? ?Pam Vargas presents today for a dry, itchy, and dark patch on her right cheek that has been present since March of this year. She has a history of similar patches under her right ear, and also has dry and itchy patches on her fingers. For her face, she tried vaseline, which did not help, and hydrocortisone 1% cream, which she states made the area more hyperpigmented. She has used no new creams, lotions, facewashes, or makeup.  ? 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  She was also treated empirically for scabies given hand itching, but that did not improve symptoms. She saw dermatology in the past year, but did not feel as though the provider she saw attended to her concerns and they did not make any changes.  ? ?She is also concerned she is anemic, and is requesting we check her iron.  ? ? ?OBJECTIVE:  ? ?BP (!) 103/54   Pulse 89   Ht '5\' 4"'$  (1.626 m)   Wt 130 lb 4 oz (59.1 kg)   LMP 02/08/2022   SpO2 98%   BMI 22.36 kg/m?  ?Gen: Well-appearing, in no distress ?HEENT: NCAT ?Lungs: no increased work of breathing ?Neuro: Alert ?MSK: Hands - all ten digits have deformities with swelling in DIP joints ?Skin: dry and hyperpigmented patch on right cheek. Dry and hypopigmented small spots in between fingers.  ? ? ? ? ?ASSESSMENT/PLAN:  ? ?Health maintenance:  ?-Check CBC given patient's concern about anemia.  ? ?Dyshidrotic eczema ?-Symptoms and history consistent with continued hand eczema.  ?-Prescribed Betamethasone for use between fingers and referred to dermatology. Advised to keep hands moisturized with vaseline and dry cotton gloves.  ? ?- For patch of dry itchy skin on face, this is likely eczema given history of eczema and appearance. Prescribed desonide for use on face, and referred to dermatology. ? ? ?FOLLOW UP: ?Follow up  as needed ? ?Annia Belt, Medical Student ?[facility] ? ?Upper Level Addendum: ?I have seen and evaluated this patient along with the medical student and reviewed the above note, making necessary revisions as appropriate. I agree with the medical decision making and physical exam as noted above. ?Ezequiel Essex, MD ?PGY-2 ?[facility] ? ? ?

## 2022-02-18 NOTE — Progress Notes (Deleted)
C-

## 2022-02-18 NOTE — Telephone Encounter (Signed)
Patient calls nurse line reporting difficulty picking up Desonide Lotion.  ? ?I spoke with the pharmacy and Desonide is not covered by medicaid. Please see below for alternatives.  ? ?DermaSmoothe  ?Hydrocortisone Cream/Lotion/Ointment ? ?Patient was able to pick up Valisone.  ? ?Will forward to provider who saw patient.  ?

## 2022-02-18 NOTE — Progress Notes (Deleted)
Blood pressure (!) 103/54, pulse 89, height '5\' 4"'$  (1.626 m), weight 130 lb 4 oz (59.1 kg), last menstrual period 02/08/2022, SpO2 98 %.\ ?

## 2022-02-18 NOTE — Patient Instructions (Signed)
It was wonderful to meet you today. Thank you for allowing me to be a part of your care. Below is a short summary of what we discussed at your visit today: ? ?Dyshidrotic eczema of the hands ?Use the betamethasone cream as prescribed on your hands only.  You may apply every day until the itchiness and lesions improved. ? ?Facial eczema ?Use the desonide lotion on your facial lesions.  Use as prescribed. I will send a referral to dermatology, however their wait list may be long. Someone from their office should be calling you in 2 weeks to schedule an appointment.  If you do not hear from them, let us know. We may need to nudge along the referral.   ? ?Please bring all of your medications to every appointment! ? ?If you have any questions or concerns, please do not hesitate to contact us via phone or MyChart message.  ? ?Ezequiel Essex, MD  ? ?Community resources everyone should know about: ?Cooking and Nutrition Classes ?The [employer] in [place] provides many classes at low or no cost to [employer], nutrition, and agriculture.  Their website offers a huge variety of information related to topics such as gardening, nutrition, cooking, parenting, and health.  Also listed are classes and events, both online and in-person.  Check out their website here: [email or web address]  ? ?Food finder app ?Download the [employer] App or Call 211 to easily find food banks and pantries and other resources nearby.  ?

## 2022-02-19 LAB — IRON AND TIBC
Iron Saturation: 44 % (ref 15–55)
Iron: 134 ug/dL (ref 27–159)
Total Iron Binding Capacity: 306 ug/dL (ref 250–450)
UIBC: 172 ug/dL (ref 131–425)

## 2022-02-19 LAB — CBC
Hematocrit: 37.9 % (ref 34.0–46.6)
Hemoglobin: 12 g/dL (ref 11.1–15.9)
MCH: 27.6 pg (ref 26.6–33.0)
MCHC: 31.7 g/dL (ref 31.5–35.7)
MCV: 87 fL (ref 79–97)
Platelets: 229 x10E3/uL (ref 150–450)
RBC: 4.35 x10E6/uL (ref 3.77–5.28)
RDW: 12.5 % (ref 11.7–15.4)
WBC: 5.1 x10E3/uL (ref 3.4–10.8)

## 2022-02-19 LAB — FERRITIN: Ferritin: 29 ng/mL (ref 15–150)

## 2022-03-30 ENCOUNTER — Encounter (HOSPITAL_COMMUNITY): Payer: Self-pay | Admitting: Emergency Medicine

## 2022-03-30 ENCOUNTER — Emergency Department (HOSPITAL_COMMUNITY)
Admission: EM | Admit: 2022-03-30 | Discharge: 2022-03-30 | Disposition: A | Discharge status: Home / Self Care | Payer: Medicaid Other | Attending: Emergency Medicine | Admitting: Emergency Medicine

## 2022-03-30 ENCOUNTER — Other Ambulatory Visit: Payer: Self-pay

## 2022-03-30 DIAGNOSIS — M542 Cervicalgia: Secondary | ICD-10-CM | POA: Diagnosis not present

## 2022-03-30 DIAGNOSIS — S34109A Unspecified injury to unspecified level of lumbar spinal cord, initial encounter: Secondary | ICD-10-CM | POA: Diagnosis present

## 2022-03-30 DIAGNOSIS — X500XXA Overexertion from strenuous movement or load, initial encounter: Secondary | ICD-10-CM | POA: Diagnosis not present

## 2022-03-30 DIAGNOSIS — S39012A Strain of muscle, fascia and tendon of lower back, initial encounter: Secondary | ICD-10-CM

## 2022-03-30 MED ORDER — OXYCODONE HCL 5 MG PO TABS
5.0000 mg | ORAL_TABLET | Freq: Once | ORAL | Status: AC
  Administered 2022-03-30: 5 mg via ORAL
  Filled 2022-03-30: qty 1

## 2022-03-30 MED ORDER — PREDNISONE 20 MG PO TABS: 40.0000 mg | ORAL_TABLET | Freq: Every day | ORAL | 0 refills | Status: AC

## 2022-03-30 MED ORDER — KETOROLAC TROMETHAMINE 15 MG/ML IJ SOLN
15.0000 mg | Freq: Once | INTRAMUSCULAR | Status: AC
  Administered 2022-03-30: 15 mg via INTRAMUSCULAR
  Filled 2022-03-30: qty 1

## 2022-03-30 MED ORDER — CYCLOBENZAPRINE HCL 10 MG PO TABS: 10.0000 mg | ORAL_TABLET | Freq: Three times a day (TID) | ORAL | 0 refills | Status: AC

## 2022-03-30 MED ORDER — DIAZEPAM 5 MG PO TABS
5.0000 mg | ORAL_TABLET | Freq: Once | ORAL | Status: AC
  Administered 2022-03-30: 5 mg via ORAL
  Filled 2022-03-30: qty 1

## 2022-03-30 MED ORDER — ACETAMINOPHEN 500 MG PO TABS
1000.0000 mg | ORAL_TABLET | Freq: Once | ORAL | Status: AC
  Administered 2022-03-30: 1000 mg via ORAL
  Filled 2022-03-30: qty 2

## 2022-03-30 NOTE — ED Provider Notes (Signed)
[facility] DEPT Provider Note   CSN: [id number] Arrival date & time: 03/30/22  1414     History  Chief Complaint  Patient presents with   Back Pain   Neck Pain    Pam Vargas is a 41 y.o. female.  41 y.o female with a PMH of lumbar strain and neck pain presents to the ED with a chief complaint of lumbar strain for the past day.  Patient reports she was picking up her 56 pound 23-year-old into the bath yesterday when she noticed a popping sensation to her lumbar spine and neck.  She also reports sleeping on her left side, and felt that she did not move all night which exacerbated the pain in her neck.  She did take Tylenol, ibuprofen for pain control without much improvement in her symptoms.  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  Pain is exacerbated with any rotation of her neck along with twisting of her body.  She has had previous visits in the past for similar incidents.  He denies any headaches, vision changes, urinary symptoms, fevers or other complaints.   The history is provided by the patient and medical records.  Back Pain Location:  Lumbar spine Quality:  Burning Radiates to:  R shoulder Pain severity:  Severe Pain is:  Same all the time Onset quality:  Gradual Duration:  1 day Timing:  Intermittent Progression:  Worsening Chronicity:  Recurrent Context: lifting heavy objects and twisting   Associated symptoms: no fever and no headaches   Neck Pain Associated symptoms: no fever and no headaches       Home Medications Prior to Admission medications   Medication Sig Start Date End Date Taking? Authorizing Provider  cyclobenzaprine (FLEXERIL) 10 MG tablet Take 1 tablet (10 mg total) by mouth 3 (three) times daily for 7 days. 03/30/22 04/06/22 Yes Marquett Bertoli, Beverley Fiedler, PA-C  predniSONE (DELTASONE) 20 MG tablet Take 2 tablets (40 mg total) by mouth daily  for 5 days. 03/30/22 04/04/22 Yes Neyah Ellerman, PA-C  betamethasone valerate (VALISONE) 0.1 % cream Apply topically 2 (two) times daily. 02/18/22   Ezequiel Essex, MD  Blood Pressure Monitoring (BLOOD PRESSURE KIT) DEVI 1 Device by Does not apply route as needed. 05/27/20   Aletha Halim, MD  Fluocinolone Acetonide Body (DERMA-SMOOTHE/FS BODY) 0.01 % OIL Apply 1 drop topically 2 (two) times daily. 02/18/22   Ezequiel Essex, MD  fluticasone [facility]) 50 MCG/ACT nasal spray Place 2 sprays into both nostrils daily. 12/14/21   Gladys Damme, MD  ibuprofen (ADVIL) 600 MG tablet Take 1 tablet (600 mg total) by mouth every 6 (six) hours as needed. 01/13/22   Domenic Moras, PA-C  metoprolol tartrate (LOPRESSOR) 25 MG tablet Take 0.5 tablets (12.5 mg total) by mouth 2 (two) times daily. 10/27/21   Chandrasekhar, Lyda Kalata A, MD  nicotine (NICODERM CQ - DOSED IN MG/24 HR) 7 mg/24hr patch Place 1 patch (7 mg total) onto the skin daily. 01/12/22   Holley Bouche, MD  permethrin (ELIMITE) 5 % cream Apply to effected area at bedtime and wash off 8-14 hours later. Can repeat in 1 week if still symptomatic. Patient not taking: Reported on 02/18/2022 04/01/21   Richarda Osmond, MD  Prenatal Vit-Fe Phos-FA-Omega (VITAFOL GUMMIES) 3.33-0.333-34.8 MG CHEW CHEW 1 DOSE BY MOUTH IN THE MORNING, AT NOON, AND AT BEDTIME. 03/15/21   Mullis, Kiersten P, DO  Allergies    Patient has no known allergies.    Review of Systems   Review of Systems  Constitutional:  Negative for chills and fever.  Musculoskeletal:  Positive for back pain, neck pain and neck stiffness.  Neurological:  Negative for headaches.   Physical Exam Updated Vital Signs BP 119/80   Pulse 83   Temp 98.5 F (36.9 C) (Oral)   Resp 16   SpO2 99%  Physical Exam Vitals and nursing note reviewed.  Constitutional:      Appearance: Normal appearance.  HENT:     Head: Normocephalic and atraumatic.     Mouth/Throat:     Mouth: Mucous membranes are  moist.  Eyes:     Pupils: Pupils are equal, round, and reactive to light.  Neck:     Trachea: Trachea normal.     Meningeal: Brudzinski's sign and Kernig's sign absent.   Pulmonary:     Effort: Pulmonary effort is normal.  Abdominal:     General: Abdomen is flat.  Musculoskeletal:     Cervical back: Normal range of motion and neck supple. Pain with movement and muscular tenderness present. No spinous process tenderness. Normal range of motion.       Back:  Lymphadenopathy:     Cervical: No cervical adenopathy.     Right cervical: No superficial or deep cervical adenopathy. Skin:    General: Skin is warm and dry.  Neurological:     Mental Status: She is alert and oriented to person, place, and time.    ED Results / Procedures / Treatments   Labs (all labs ordered are listed, but only abnormal results are displayed) Labs Reviewed - No data to display  EKG None  Radiology No results found.  Procedures Procedures    Medications Ordered in ED Medications  ketorolac (TORADOL) 15 MG/ML injection 15 mg (15 mg Intramuscular Given 03/30/22 1521)  diazepam (VALIUM) tablet 5 mg (5 mg Oral Given 03/30/22 1519)  oxyCODONE (Oxy IR/ROXICODONE) immediate release tablet 5 mg (5 mg Oral Given 03/30/22 1519)  acetaminophen (TYLENOL) tablet 1,000 mg (1,000 mg Oral Given 03/30/22 1518)    ED Course/ Medical Decision Making/ A&P                           Medical Decision Making Risk OTC drugs. Prescription drug management.   Patient presents to the ED with a chief complaint of lumbar strain for the past day.  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  She endorses prior episodes similar to these, she did take some ibuprofen, Tylenol but there was not much improvement in symptoms.  She denies any urinary symptoms, no headache, no vision  changes, no fevers.  She does have full range of motion of her neck with pain however no meningeal signs to suggest meningitis. She is without any CVA tenderness, no urinary symptoms, no fever low suspicion for pyelo. She has pain along the lumbar spine but not midline, previously had imaging of her cervical spine and lumbar spine.  We will provide her with pain control on today's visit.  Patient reevaluated after medication, does report some improvement in her symptoms.  I did discuss with her that seeing as this pain is ongoing, she probably needs a spine specialist consult.  She is  without any urinary symptoms.  No other signs of trauma, or any normal vitals to suggest infection.  No bowel or bladder complaints, no prior history of cancer, no perianal numbness.  Patient will go home on a short course of muscle relaxers along with steroids to help with pain control.  Patient hemodynamically stable for discharge.    Portions of this note were generated with [employer]. Dictation errors may occur despite best attempts at proofreading.   Final Clinical Impression(s) / ED Diagnoses Final diagnoses:  Strain of lumbar region, initial encounter    Rx / DC Orders ED Discharge Orders          Ordered    cyclobenzaprine (FLEXERIL) 10 MG tablet  3 times daily        03/30/22 1609    predniSONE (DELTASONE) 20 MG tablet  Daily        03/30/22 1609              Janeece Fitting, PA-C 03/30/22 [place], [place], DO 03/30/22 1614

## 2022-03-30 NOTE — Discharge Instructions (Addendum)
You are prescribed muscle relaxers to help with the pain on your back.  Please take 1 tablet up to 3 times a day for pain control.  Please be aware this medication can make you drowsy, do not drink alcohol or drive while taking this medication.  The second medication prescribed to you is a steroid, you will need to take 2 tablets daily for the next 5 days.  Please be aware this medication can cause flushness, insomnia, appetite changes.  Due to the ongoing episodes of pain along your back, I do feel that you need further follow-up with spine specialist.  Please schedule an appointment at your earliest convenience.

## 2022-03-30 NOTE — ED Triage Notes (Signed)
Patient presents with neck and back pain. She believes she may have pulled a muscle in her back last night. As she was placing her child into the bath she noticed a popping sound and then burning in her back. The neck pain also started yesterday. She believes this pain could be due to a poor sleep position.

## 2022-04-02 ENCOUNTER — Emergency Department (HOSPITAL_COMMUNITY)
Admission: EM | Admit: 2022-04-02 | Discharge: 2022-04-02 | Disposition: A | Discharge status: Home / Self Care | Payer: Medicaid Other | Attending: Emergency Medicine | Admitting: Emergency Medicine

## 2022-04-02 ENCOUNTER — Encounter (HOSPITAL_COMMUNITY): Payer: Self-pay

## 2022-04-02 DIAGNOSIS — S39012A Strain of muscle, fascia and tendon of lower back, initial encounter: Secondary | ICD-10-CM | POA: Diagnosis not present

## 2022-04-02 DIAGNOSIS — X501XXA Overexertion from prolonged static or awkward postures, initial encounter: Secondary | ICD-10-CM | POA: Diagnosis not present

## 2022-04-02 DIAGNOSIS — Z79899 Other long term (current) drug therapy: Secondary | ICD-10-CM | POA: Diagnosis not present

## 2022-04-02 DIAGNOSIS — S29012A Strain of muscle and tendon of back wall of thorax, initial encounter: Secondary | ICD-10-CM | POA: Diagnosis not present

## 2022-04-02 DIAGNOSIS — S3992XA Unspecified injury of lower back, initial encounter: Secondary | ICD-10-CM | POA: Diagnosis present

## 2022-04-02 DIAGNOSIS — T148XXA Other injury of unspecified body region, initial encounter: Secondary | ICD-10-CM

## 2022-04-02 DIAGNOSIS — M546 Pain in thoracic spine: Secondary | ICD-10-CM

## 2022-04-02 MED ORDER — NAPROXEN 375 MG PO TABS
375.0000 mg | ORAL_TABLET | Freq: Two times a day (BID) | ORAL | 0 refills | Status: DC
Start: 2022-04-02 — End: 2022-08-01

## 2022-04-02 MED ORDER — LIDOCAINE 5 % EX PTCH
1.0000 | MEDICATED_PATCH | CUTANEOUS | 0 refills | Status: DC
Start: 2022-04-02 — End: 2022-04-02

## 2022-04-02 MED ORDER — NAPROXEN 375 MG PO TABS: 375.0000 mg | ORAL_TABLET | Freq: Two times a day (BID) | ORAL | 0 refills | Status: DC

## 2022-04-02 MED ORDER — KETOROLAC TROMETHAMINE 15 MG/ML IJ SOLN
15.0000 mg | Freq: Once | INTRAMUSCULAR | Status: AC
  Administered 2022-04-02: 15 mg via INTRAMUSCULAR
  Filled 2022-04-02: qty 1

## 2022-04-02 MED ORDER — LIDOCAINE 5 % EX PTCH: 1.0000 | MEDICATED_PATCH | CUTANEOUS | 0 refills | Status: DC

## 2022-04-02 MED ORDER — LIDOCAINE 5 % EX PTCH
1.0000 | MEDICATED_PATCH | CUTANEOUS | Status: DC
  Administered 2022-04-02: 1 via TRANSDERMAL
  Filled 2022-04-02: qty 1

## 2022-04-02 NOTE — ED Triage Notes (Signed)
Pt c/o lower back and neck pain ongoing since Thursday. Pt was seen in ED for same. Pt states that the pain medication prescribed to her has not helped. Denies loss of bowel/bladder function. No saddle anesthesia per pt. No known injury, trauma, or fall.

## 2022-04-02 NOTE — ED Provider Notes (Signed)
[facility] DEPT Provider Note   CSN: [id number] Arrival date & time: 04/02/22  1652     History Chief Complaint  Patient presents with   Back Pain    Pam Vargas is a 41 y.o. female who presents the [facility] apartment with mid right back pain that has been ongoing for a week.  Patient was seen evaluated in the emergency department on 03/30/2022 for similar symptoms.  At that time she was also having neck pain.  Her neck pain has since resolved.  At the initial injury, she was lifting her child into the bath and felt a popping sensation in the back.  She has been taking the cyclobenzaprine and prednisone was prescribed in the previous visit with little relief.  She denies any new injury.   Back Pain     Home Medications Prior to Admission medications   Medication Sig Start Date End Date Taking? Authorizing Provider  lidocaine (LIDODERM) 5 % Place 1 patch onto the skin daily. Remove & Discard patch within 12 hours or as directed by MD 04/02/22  Yes Raul Del, Emiliana Blaize M, PA-C  naproxen (NAPROSYN) 375 MG tablet Take 1 tablet (375 mg total) by mouth 2 (two) times daily. 04/02/22  Yes Raul Del, Cyrah Mclamb M, PA-C  betamethasone valerate (VALISONE) 0.1 % cream Apply topically 2 (two) times daily. 02/18/22   Ezequiel Essex, MD  Blood Pressure Monitoring (BLOOD PRESSURE KIT) DEVI 1 Device by Does not apply route as needed. 05/27/20   Aletha Halim, MD  cyclobenzaprine (FLEXERIL) 10 MG tablet Take 1 tablet (10 mg total) by mouth 3 (three) times daily for 7 days. 03/30/22 04/06/22  Janeece Fitting, PA-C  Fluocinolone Acetonide Body (DERMA-SMOOTHE/FS BODY) 0.01 % OIL Apply 1 drop topically 2 (two) times daily. 02/18/22   Ezequiel Essex, MD  fluticasone [facility]) 50 MCG/ACT nasal spray Place 2 sprays into both nostrils daily. 12/14/21   Gladys Damme, MD  ibuprofen (ADVIL) 600 MG tablet Take 1 tablet (600 mg total) by mouth every 6 (six) hours as needed. 01/13/22   Domenic Moras,  PA-C  metoprolol tartrate (LOPRESSOR) 25 MG tablet Take 0.5 tablets (12.5 mg total) by mouth 2 (two) times daily. 10/27/21   Chandrasekhar, Lyda Kalata A, MD  nicotine (NICODERM CQ - DOSED IN MG/24 HR) 7 mg/24hr patch Place 1 patch (7 mg total) onto the skin daily. 01/12/22   Holley Bouche, MD  permethrin (ELIMITE) 5 % cream Apply to effected area at bedtime and wash off 8-14 hours later. Can repeat in 1 week if still symptomatic. Patient not taking: Reported on 02/18/2022 04/01/21   Richarda Osmond, MD  predniSONE (DELTASONE) 20 MG tablet Take 2 tablets (40 mg total) by mouth daily for 5 days. 03/30/22 04/04/22  Janeece Fitting, PA-C  Prenatal Vit-Fe Phos-FA-Omega (VITAFOL GUMMIES) 3.33-0.333-34.8 MG CHEW CHEW 1 DOSE BY MOUTH IN THE MORNING, AT NOON, AND AT BEDTIME. 03/15/21   Mullis, Kiersten P, DO      Allergies    Patient has no known allergies.    Review of Systems   Review of Systems  Musculoskeletal:  Positive for back pain.  All other systems reviewed and are negative.  Physical Exam Updated Vital Signs BP 119/61 (BP Location: Right Arm)   Pulse 84   Temp 99.3 F (37.4 C) (Oral)   Resp 18   SpO2 100%  Physical Exam Vitals and nursing note reviewed.  Constitutional:      Appearance: Normal appearance.  HENT:     Head:  Normocephalic and atraumatic.  Eyes:     General:        Right eye: No discharge.        Left eye: No discharge.     Conjunctiva/sclera: Conjunctivae normal.  Pulmonary:     Effort: Pulmonary effort is normal.  Musculoskeletal:       Arms:  Skin:    General: Skin is warm and dry.     Findings: No rash.  Neurological:     General: No focal deficit present.     Mental Status: She is alert.  Psychiatric:        Mood and Affect: Mood normal.        Behavior: Behavior normal.    ED Results / Procedures / Treatments   Labs (all labs ordered are listed, but only abnormal results are displayed) Labs Reviewed - No data to display  EKG None  Radiology No  results found.  Procedures Procedures    Medications Ordered in ED Medications  lidocaine (LIDODERM) 5 % 1 patch (1 patch Transdermal Patch Applied 04/02/22 1750)  ketorolac (TORADOL) 15 MG/ML injection 15 mg (15 mg Intramuscular Given 04/02/22 1750)    ED Course/ Medical Decision Making/ A&P                           Medical Decision Making HENRETTA QUIST is a 41 y.o. female patient to the ED with right middle back pain. I have a low suspicion for acute in the spine right now.  Patient has point tenderness over the right mid thoracic back.  No spinal tenderness.  No weakness to the upper extremities or lower extremities.  Normal sensation.  Patient is feeling much better after Toradol and lidocaine patches.  I will give her some naproxen and lidocaine patches to go home with.  Do not feel imaging is warranted at this time.  She is safe for discharge with strict return precautions.   Risk Prescription drug management.   Final Clinical Impression(s) / ED Diagnoses Final diagnoses:  Muscle strain    Rx / DC Orders ED Discharge Orders          Ordered    naproxen (NAPROSYN) 375 MG tablet  2 times daily        04/02/22 1809    lidocaine (LIDODERM) 5 %  Every 24 hours        04/02/22 1809              Myna Bright [place], PA-C 04/02/22 1825    Gareth Morgan, MD 04/03/22 [phone]

## 2022-04-02 NOTE — Discharge Instructions (Addendum)
Please take medications as prescribed.  You can use lidocaine patches every 12 hours as needed for pain.  Please do some light stretching.  Please return to the emergency part for any worsening symptoms you might have.

## 2022-04-03 ENCOUNTER — Encounter (HOSPITAL_COMMUNITY): Payer: Self-pay

## 2022-04-03 ENCOUNTER — Emergency Department (HOSPITAL_COMMUNITY): Payer: Medicaid Other

## 2022-04-03 ENCOUNTER — Other Ambulatory Visit: Payer: Self-pay

## 2022-04-03 ENCOUNTER — Emergency Department (HOSPITAL_COMMUNITY)
Admission: EM | Admit: 2022-04-03 | Discharge: 2022-04-03 | Disposition: A | Discharge status: Home / Self Care | Payer: Medicaid Other | Attending: Emergency Medicine | Admitting: Emergency Medicine

## 2022-04-03 DIAGNOSIS — R079 Chest pain, unspecified: Secondary | ICD-10-CM | POA: Diagnosis not present

## 2022-04-03 DIAGNOSIS — M546 Pain in thoracic spine: Secondary | ICD-10-CM | POA: Insufficient documentation

## 2022-04-03 MED ORDER — OXYCODONE-ACETAMINOPHEN 5-325 MG PO TABS
1.0000 | ORAL_TABLET | Freq: Once | ORAL | Status: AC
  Administered 2022-04-03: 1 via ORAL
  Filled 2022-04-03: qty 1

## 2022-04-03 MED ORDER — KETOROLAC TROMETHAMINE 15 MG/ML IJ SOLN
15.0000 mg | Freq: Once | INTRAMUSCULAR | Status: AC
Start: 2022-04-03 — End: 2022-04-03
  Administered 2022-04-03: 15 mg via INTRAMUSCULAR
  Filled 2022-04-03: qty 1

## 2022-04-03 NOTE — ED Triage Notes (Signed)
Pt states she has continued back pain that is radiating, tingling, and burning. Pt reports its a throbbing pain.   Pt reports the pain mediation given did not work.

## 2022-04-03 NOTE — Discharge Instructions (Signed)
Continue taking all the medications that you have already been prescribed, follow-up with orthopedics at your earliest convenience.  Please try some of the rehab exercises that have attached.
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
## 2022-04-05 ENCOUNTER — Encounter: Payer: Self-pay | Admitting: *Deleted

## 2022-05-16 IMAGING — DX DG CERVICAL SPINE 2 OR 3 VIEWS
3 series · 3 of 3 positions shown · non-contrast
Comparison: 05/31/2016

CLINICAL DATA: Neck pain with numbness, tingling and burning.

EXAM:
CERVICAL SPINE - 2-3 VIEW

[c-spine lat]
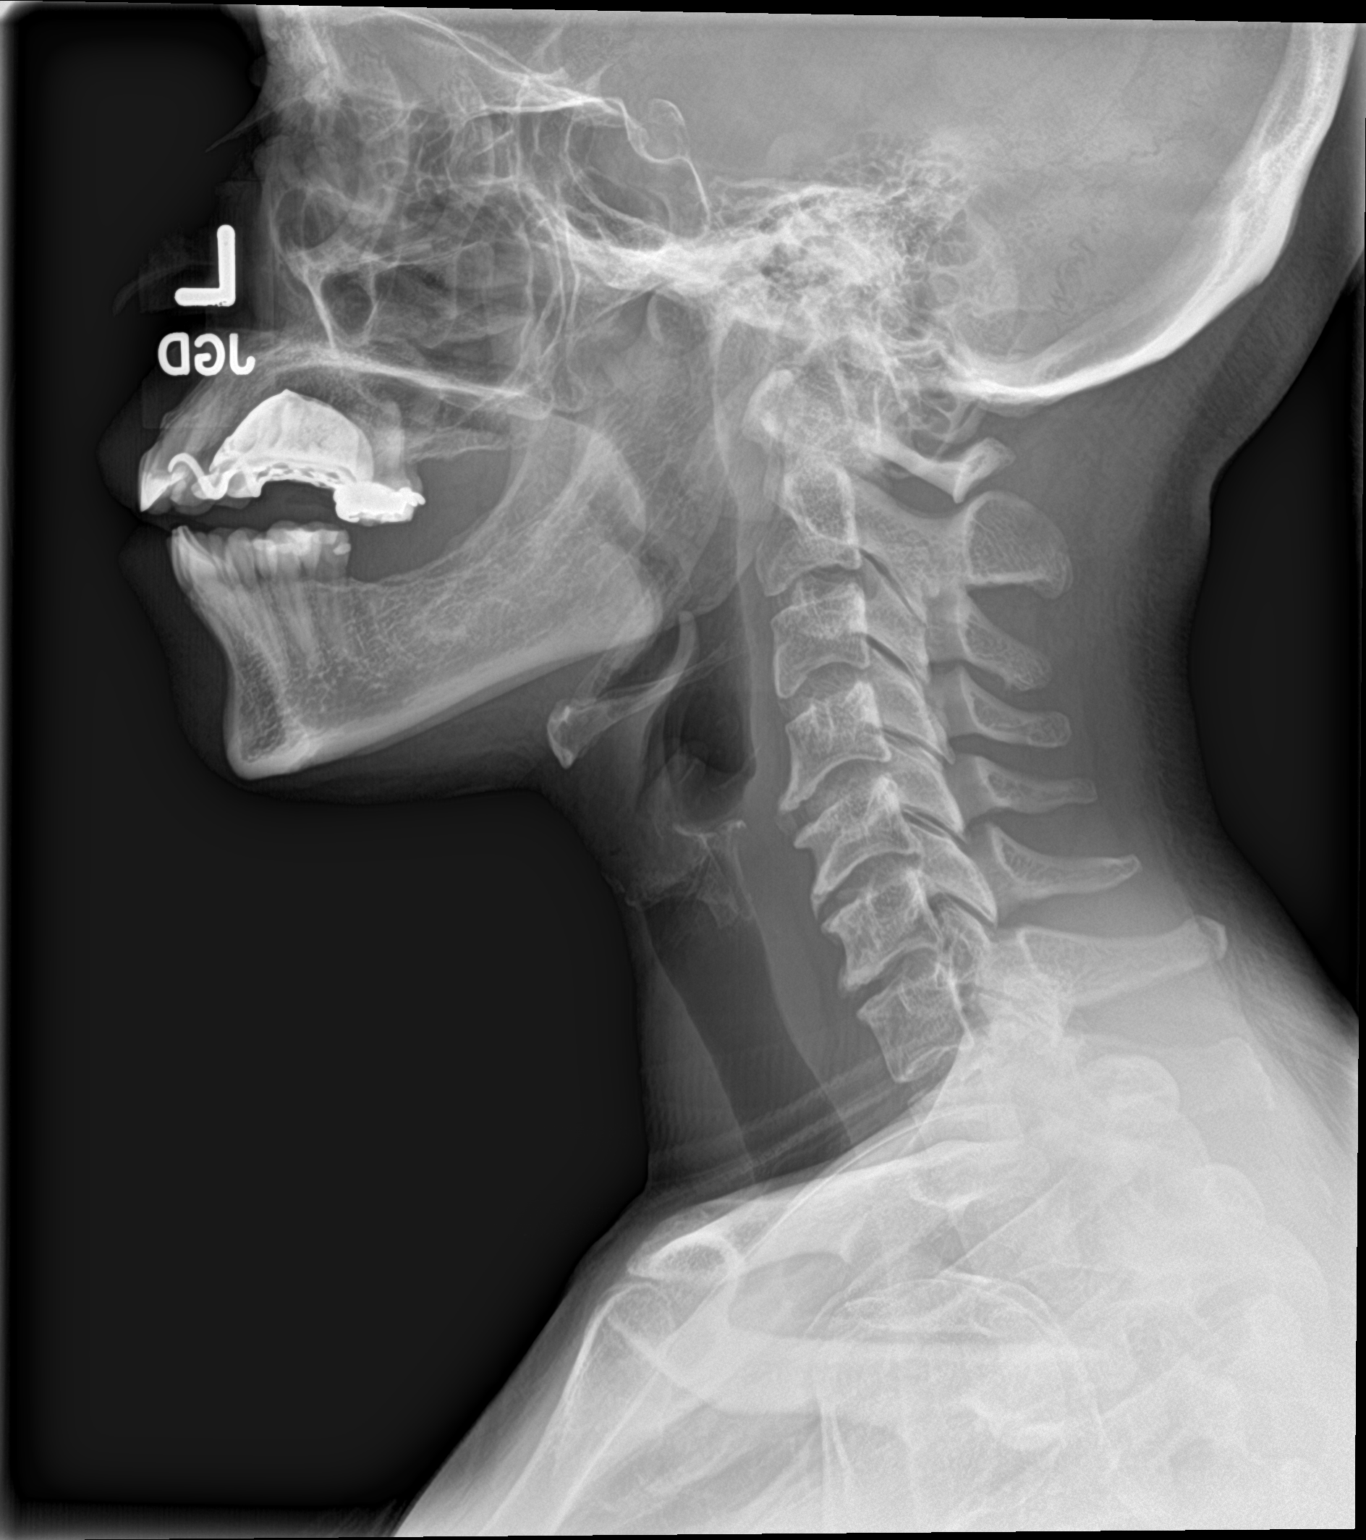

[c-spine ap]
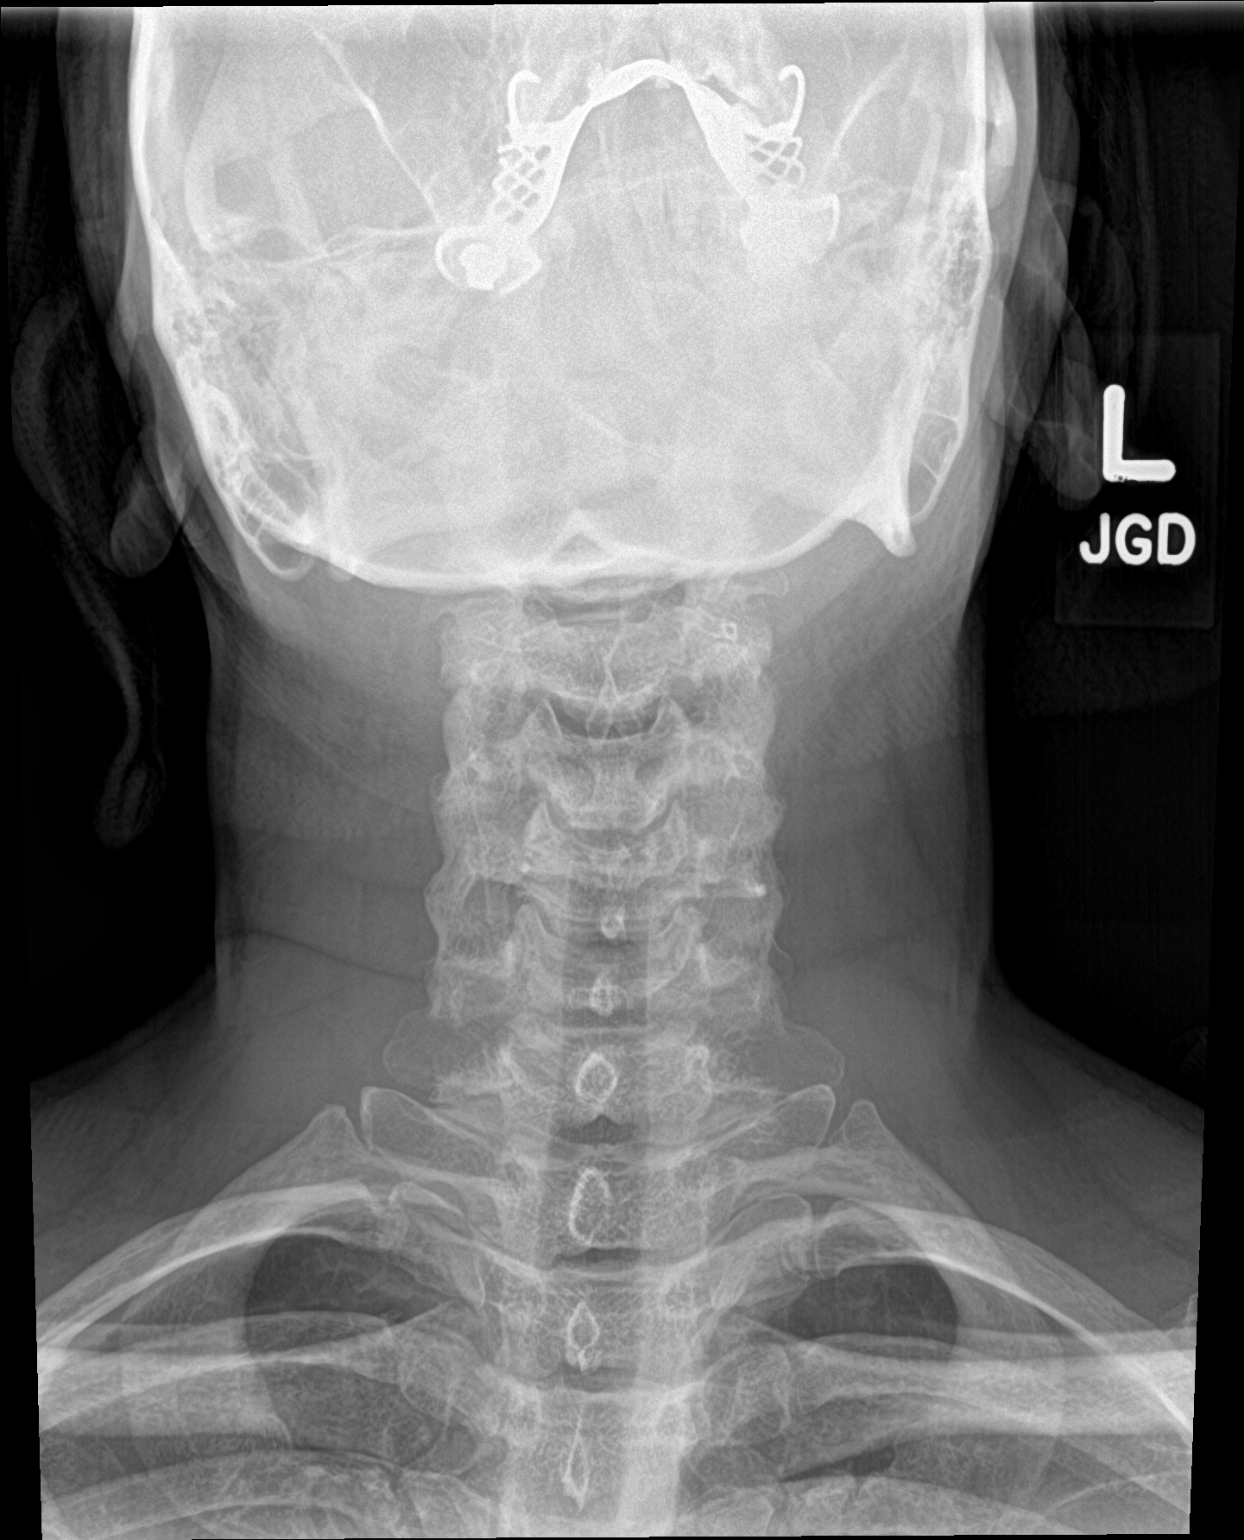

[c-spine open mouth]
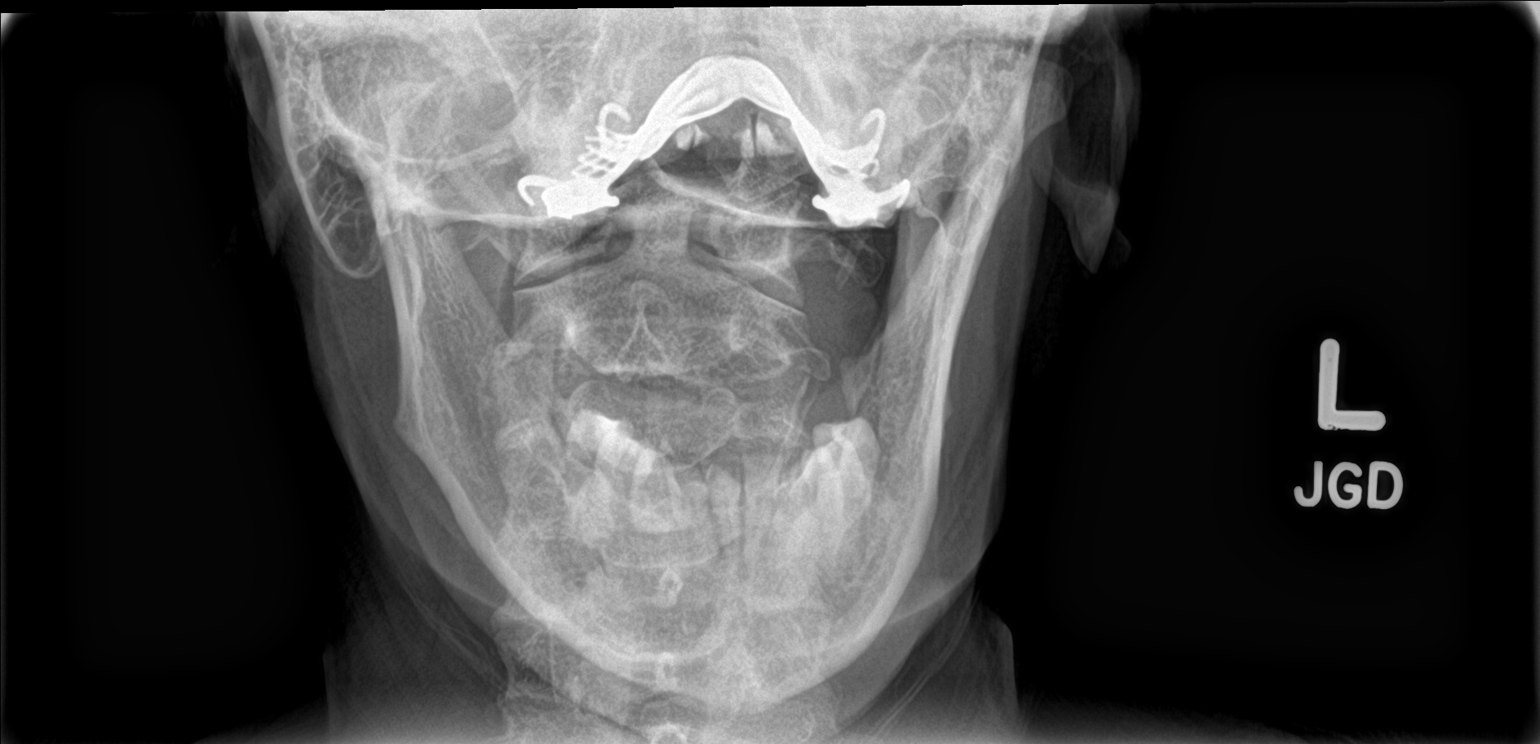

[3 of 3 positions shown; findings below may reference images not displayed]

FINDINGS: Mid cervical spondylosis with mild disc space narrowing and endplate
osteophytes at C4-5, C5-6 and C6-7. Findings have worsened slightly
since 8446. Minimal curvature of the neck convex to the left. Soft
tissue shadows are normal.
IMPRESSION: Spondylosis at C4-5, C5-6 and C6-7, worsened since [DATE].

## 2022-05-25 ENCOUNTER — Encounter: Payer: Self-pay | Admitting: Family Medicine

## 2022-05-25 ENCOUNTER — Ambulatory Visit (INDEPENDENT_AMBULATORY_CARE_PROVIDER_SITE_OTHER): Payer: Medicaid Other | Admitting: Family Medicine

## 2022-05-25 VITALS — BP 94/64 | HR 69 | Ht 64.0 in | Wt 127.4 lb

## 2022-05-25 DIAGNOSIS — M7742 Metatarsalgia, left foot: Secondary | ICD-10-CM | POA: Diagnosis not present

## 2022-05-25 NOTE — Progress Notes (Signed)
    CHIEF COMPLAINT / HPI: 2 to 3 weeks of left foot pain.  Forefoot.  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  No injury.  No numbness or tingling.  No change in weight.   PERTINENT  PMH / PSH: I have reviewed the patient's medications, allergies, past medical and surgical history, smoking status and updated in the EMR as appropriate.   OBJECTIVE:  BP 94/64   Pulse 69   Ht '5\' 4"'$  (1.626 m)   Wt 127 lb 6.4 oz (57.8 kg)   LMP 05/23/2022 (Exact Date)   SpO2 100%   BMI 21.87 kg/m  GENERAL: Well-developed female no acute distress FEET: Pes planus with loss of transverse arch as well in the left foot.  She has a thickened callus over the metatarsal midfoot plate on the plantar surface.  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  Since she is on her feet so often she would benefit from custom molded orthotics and I have given her instruction on how to schedule appointment for that.  We had a metatarsal pad in the clinic which I placed in her current pair of shoes and she said it felt better, taking weight off the area.  Also discussed use of pumice stone.  No problem-specific Assessment & Plan notes found for this encounter.   Dorcas Mcmurray MD

## 2022-05-25 NOTE — Patient Instructions (Signed)
You are developing a callus over the forefoot because of the way your foot is made.  I have placed a small pad in your left shoe that will help take pressure off this.  Next time you are at the drugstore, buy something called a pumice stone.  Use that after shower or bath just to rub across the callused area to eventually get rid of the callus.  Ideally, I would have you make an appointment at sports medicine, [phone].  Tell them you were seen appear and you need to be scheduled for custom molded orthotics.  When you come to that appointment, bring the shoes you wear most often or the 2 pairs of shoes you wear most often such as your work shoes so we can make sure the insert fits in your shoes.  Great to meet you!

## 2022-07-01 ENCOUNTER — Other Ambulatory Visit: Payer: Self-pay

## 2022-07-01 ENCOUNTER — Other Ambulatory Visit: Payer: Self-pay | Admitting: Internal Medicine

## 2022-07-14 ENCOUNTER — Other Ambulatory Visit: Payer: Self-pay

## 2022-07-14 ENCOUNTER — Emergency Department: Payer: Medicaid Other

## 2022-07-14 ENCOUNTER — Emergency Department
Admission: EM | Admit: 2022-07-14 | Discharge: 2022-07-14 | Disposition: A | Discharge status: Home / Self Care | Payer: Medicaid Other | Attending: Emergency Medicine | Admitting: Emergency Medicine

## 2022-07-14 DIAGNOSIS — S91209A Unspecified open wound of unspecified toe(s) with damage to nail, initial encounter: Secondary | ICD-10-CM

## 2022-07-14 DIAGNOSIS — M4602 Spinal enthesopathy, cervical region: Secondary | ICD-10-CM | POA: Diagnosis not present

## 2022-07-14 DIAGNOSIS — Y92410 Unspecified street and highway as the place of occurrence of the external cause: Secondary | ICD-10-CM | POA: Diagnosis not present

## 2022-07-14 DIAGNOSIS — S99921A Unspecified injury of right foot, initial encounter: Secondary | ICD-10-CM | POA: Diagnosis not present

## 2022-07-14 DIAGNOSIS — S0990XA Unspecified injury of head, initial encounter: Secondary | ICD-10-CM | POA: Diagnosis not present

## 2022-07-14 DIAGNOSIS — S199XXA Unspecified injury of neck, initial encounter: Secondary | ICD-10-CM | POA: Diagnosis not present

## 2022-07-14 DIAGNOSIS — S91201A Unspecified open wound of right great toe with damage to nail, initial encounter: Secondary | ICD-10-CM | POA: Insufficient documentation

## 2022-07-14 MED ORDER — IBUPROFEN 600 MG PO TABS: 600.0000 mg | ORAL_TABLET | Freq: Three times a day (TID) | ORAL | 0 refills | Status: DC | PRN

## 2022-07-14 MED ORDER — OXYCODONE-ACETAMINOPHEN 5-325 MG PO TABS: 1.0000 | ORAL_TABLET | ORAL | 0 refills | Status: DC | PRN

## 2022-07-14 MED ORDER — LIDOCAINE-EPINEPHRINE-TETRACAINE (LET) TOPICAL GEL
3.0000 mL | Freq: Once | TOPICAL | Status: AC
  Administered 2022-07-14: 3 mL via TOPICAL
  Filled 2022-07-14: qty 3

## 2022-07-14 MED ORDER — OXYCODONE-ACETAMINOPHEN 5-325 MG PO TABS
1.0000 | ORAL_TABLET | Freq: Once | ORAL | Status: AC
  Administered 2022-07-14: 1 via ORAL
  Filled 2022-07-14: qty 1

## 2022-07-14 NOTE — Discharge Instructions (Addendum)
You may take pain medicines as needed.  Use crutches as needed.  Return to the ER for worsening symptoms, persistent vomiting, difficulty breathing, increased redness/swelling, purulent discharge or other concerns.
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
## 2022-08-01 ENCOUNTER — Other Ambulatory Visit (HOSPITAL_COMMUNITY)
Admission: RE | Admit: 2022-08-01 | Discharge: 2022-08-01 | Disposition: A | Discharge status: Home / Self Care | Payer: Medicaid Other | Source: Ambulatory Visit | Attending: Family Medicine | Admitting: Family Medicine

## 2022-08-01 ENCOUNTER — Ambulatory Visit (INDEPENDENT_AMBULATORY_CARE_PROVIDER_SITE_OTHER): Payer: Medicaid Other | Admitting: Family Medicine

## 2022-08-01 ENCOUNTER — Encounter: Payer: Self-pay | Admitting: Family Medicine

## 2022-08-01 VITALS — BP 110/78 | HR 79 | Wt 124.0 lb

## 2022-08-01 DIAGNOSIS — Z716 Tobacco abuse counseling: Secondary | ICD-10-CM | POA: Diagnosis not present

## 2022-08-01 DIAGNOSIS — Z23 Encounter for immunization: Secondary | ICD-10-CM | POA: Diagnosis not present

## 2022-08-01 DIAGNOSIS — B9689 Other specified bacterial agents as the cause of diseases classified elsewhere: Secondary | ICD-10-CM

## 2022-08-01 DIAGNOSIS — N760 Acute vaginitis: Secondary | ICD-10-CM

## 2022-08-01 DIAGNOSIS — F17200 Nicotine dependence, unspecified, uncomplicated: Secondary | ICD-10-CM | POA: Diagnosis not present

## 2022-08-01 DIAGNOSIS — Z0000 Encounter for general adult medical examination without abnormal findings: Secondary | ICD-10-CM | POA: Diagnosis not present

## 2022-08-01 LAB — POCT WET PREP (WET MOUNT)
Clue Cells Wet Prep Whiff POC: POSITIVE
Trichomonas Wet Prep HPF POC: ABSENT
WBC, Wet Prep HPF POC: NONE SEEN

## 2022-08-01 MED ORDER — METRONIDAZOLE 500 MG PO TABS: 500.0000 mg | ORAL_TABLET | Freq: Two times a day (BID) | ORAL | 0 refills | Status: DC

## 2022-08-01 NOTE — Assessment & Plan Note (Signed)
Discussed benefits of quitting, pre contemplative, encouraged her to call if she wishes to discuss quitting

## 2022-08-01 NOTE — Patient Instructions (Signed)
It was wonderful to see you today.  Please bring ALL of your medications with you to every visit.   Today we talked about:  Today at your annual preventive visit we talked about the following measures:   I recommend 150 minutes of exercise per week-try 30 minutes 5 days per week We discussed reducing sugary beverages (like soda and juice) and increasing leafy greens and whole fruits.  We discussed avoiding tobacco and alcohol.  I recommend avoiding illicit substances.  Please think about quitting smoking Call us when you are ready to quit  I will message you with results    Please follow up in 12 months   Thank you for choosing [facility].   Please call [phone] with any questions about today's appointment.  Please be sure to schedule follow up at the front  desk before you leave today.   Tobacco use is damaging to your body. It increases your risk of stroke, heart attack, lung cancer, and serious lung disease in the future. It also reduces your fertility.   Quitting tobacco is the best thing for your health but is a challenge---nicotine, a chemical in cigarettes, is highly addictive.   You can call 1 800 QUIT NOW ([phone])682-4421)---you will be connected with a Artist. They can also mail you nicotine gums, lozenges, and patches to quit.   Ask me about patches (which you wear all day) and gums (which you use when you have a craving) to help you quit.   There are safe, effective medications to help you quit--  Varencline---also called Chantix---- is the most common medication used to help people stop smoking. It starts a low dose and is increased. I recommended choosing a quit date then starting the medication 8 days before this. Side effects include mild headache, difficulty sleeping, and odd dreams. The medication is typically very well tolerated.     Bupropion---also called Zyban---- is started 1 week before your quit date. You take 1 pill  for three days then increase to 1 pill twice per day. Side effects include a mild headache and anxiety---this usually goes away. Some patients experience weight loss.     Dorris Singh, MD  Family Medicine

## 2022-08-01 NOTE — Progress Notes (Signed)
    SUBJECTIVE:   Chief compliant/HPI: annual examination  Pam Vargas is a 41 y.o. who presents today for an annual exam.   She has concerns today of vaginal odor. Sexually active with 1 partner, her husband for 18 years. Uses nothing for protection.Has a 43 year old at home. No discharge. Does endorse new soap use. LMP 07/11/2022.  Smokes 6 cigarettes per day. Not interested in quitting--had recent stressor of father daying.   History tabs reviewed and updated yes.   Review of systems form reviewed and notable for none.   OBJECTIVE:   Vitals:   08/01/22 1159  BP: 110/78  Pulse: 79  SpO2: 98%    BP 110/78   Pulse 79   Wt 124 lb (56.2 kg)   LMP 07/11/2022 (Exact Date)   SpO2 98%   BMI 21.28 kg/m   HEENT: EOMI. Sclera without injection or icterus. MMM. External auditory canal examined and WNL. TM normal appearance, no erythema or bulging. Neck: Supple.  Cardiac: Regular rate and rhythm. Normal S1/S2. No murmurs, rubs, or gallops appreciated. Lungs: Clear bilaterally to ascultation.  Psych: Pleasant and appropriate  GU Exam:    External exam: Normal-appearing female external genitalia.  Vaginal exam notable for normal appearance.  Cervix without discharge but irregular appearance at 9 oclock (has known Nabothian cyst at this location, see MRI)  Chaperoned examine, CMA D Blount.    ASSESSMENT/PLAN:   TOBACCO USER Discussed benefits of quitting, pre contemplative, encouraged her to call if she wishes to discuss quitting    Vaginal Odor Exam benign, + clue cells, will treat with Flagyl 500 mg BID for 7 days   Annual Examination  See AVS for age appropriate recommendations.   PHQ score 0, reviewed and discussed.  Blood pressure reviewed and at goal.  Asked about intimate partner violence and resources given as appropriate  The patient currently uses nothing for contraception. Folate recommended as appropriate, minimum of 400 mcg per day. Discussed options    Considered the following items based upon USPSTF recommendations: Diabetes screening:  NA Screening for elevated cholesterol: ordered HIV testing: ordered Hepatitis C: ordered Hepatitis B: ordered Syphilis if at high risk: ordered GC/CT at high risk, ordered.  Reviewed risk factors for latent tuberculosis and not indicated   Cervical cancer screening: prior Pap reviewed, repeat due in 2025 Breast cancer screening: discussed potential benefits, risks including overdiagnosis and biopsy, elected proceed with mammogram Colorectal cancer screening: not applicable given age.  if age 76 or over.   Follow up in 1  year or sooner if indicated.    Martyn Malay, MD [place]

## 2022-08-02 ENCOUNTER — Encounter: Payer: Self-pay | Admitting: Family Medicine

## 2022-08-02 ENCOUNTER — Telehealth: Payer: Self-pay | Admitting: Family Medicine

## 2022-08-02 LAB — CBC
Hematocrit: 40.2 % (ref 34.0–46.6)
Hemoglobin: 13.4 g/dL (ref 11.1–15.9)
MCH: 28.8 pg (ref 26.6–33.0)
MCHC: 33.3 g/dL (ref 31.5–35.7)
MCV: 87 fL (ref 79–97)
Platelets: 235 10*3/uL (ref 150–450)
RBC: 4.65 x10E6/uL (ref 3.77–5.28)
RDW: 12.8 % (ref 11.7–15.4)
WBC: 5.4 10*3/uL (ref 3.4–10.8)

## 2022-08-02 LAB — LIPID PANEL
Chol/HDL Ratio: 2.6 ratio (ref 0.0–4.4)
Cholesterol, Total: 156 mg/dL (ref 100–199)
HDL: 59 mg/dL (ref >39)
LDL Chol Calc (NIH): 86 mg/dL (ref 0–99)
Triglycerides: 55 mg/dL (ref 0–149)
VLDL Cholesterol Cal: 11 mg/dL (ref 5–40)

## 2022-08-02 LAB — CERVICOVAGINAL ANCILLARY ONLY
Chlamydia: POSITIVE — AB
Comment: NEGATIVE
Comment: NEGATIVE
Comment: NORMAL
Neisseria Gonorrhea: NEGATIVE
Trichomonas: NEGATIVE

## 2022-08-02 LAB — RPR: RPR Ser Ql: NONREACTIVE

## 2022-08-02 LAB — HCV AB W REFLEX TO QUANT PCR: HCV Ab: NONREACTIVE

## 2022-08-02 LAB — HCV INTERPRETATION

## 2022-08-02 LAB — HEPATITIS B SURFACE ANTIGEN: Hepatitis B Surface Ag: NEGATIVE

## 2022-08-02 LAB — HIV ANTIBODY (ROUTINE TESTING W REFLEX): HIV Screen 4th Generation wRfx: NONREACTIVE

## 2022-08-02 MED ORDER — DOXYCYCLINE HYCLATE 100 MG PO TABS: 100.0000 mg | ORAL_TABLET | Freq: Two times a day (BID) | ORAL | 0 refills | Status: DC

## 2022-08-02 NOTE — Telephone Encounter (Signed)
Called patient. Discussed + chlamydia. Rx for doxycycline. Discussed partner treatment. Test of recurrence in 3 months. All questions answered.   Dorris Singh, MD  Family Medicine Teaching Service

## 2022-08-05 ENCOUNTER — Encounter: Payer: Medicaid Other | Admitting: Family Medicine

## 2022-08-11 ENCOUNTER — Encounter: Payer: Self-pay | Admitting: Family Medicine

## 2022-08-11 DIAGNOSIS — M79671 Pain in right foot: Secondary | ICD-10-CM | POA: Diagnosis not present

## 2022-08-11 DIAGNOSIS — S9031XA Contusion of right foot, initial encounter: Secondary | ICD-10-CM | POA: Diagnosis not present

## 2022-08-18 ENCOUNTER — Emergency Department (HOSPITAL_COMMUNITY): Payer: Medicaid Other

## 2022-08-18 ENCOUNTER — Encounter (HOSPITAL_COMMUNITY): Payer: Self-pay | Admitting: Radiology

## 2022-08-18 ENCOUNTER — Other Ambulatory Visit: Payer: Self-pay

## 2022-08-18 ENCOUNTER — Emergency Department (HOSPITAL_COMMUNITY)
Admission: EM | Admit: 2022-08-18 | Discharge: 2022-08-18 | Disposition: A | Discharge status: Home / Self Care | Payer: Medicaid Other | Attending: Emergency Medicine | Admitting: Emergency Medicine

## 2022-08-18 ENCOUNTER — Other Ambulatory Visit: Payer: Self-pay | Admitting: Internal Medicine

## 2022-08-18 DIAGNOSIS — M25512 Pain in left shoulder: Secondary | ICD-10-CM | POA: Diagnosis not present

## 2022-08-18 DIAGNOSIS — M549 Dorsalgia, unspecified: Secondary | ICD-10-CM | POA: Diagnosis not present

## 2022-08-18 DIAGNOSIS — R0602 Shortness of breath: Secondary | ICD-10-CM | POA: Diagnosis not present

## 2022-08-18 DIAGNOSIS — X500XXA Overexertion from strenuous movement or load, initial encounter: Secondary | ICD-10-CM | POA: Diagnosis not present

## 2022-08-18 DIAGNOSIS — S39012A Strain of muscle, fascia and tendon of lower back, initial encounter: Secondary | ICD-10-CM | POA: Diagnosis not present

## 2022-08-18 DIAGNOSIS — R079 Chest pain, unspecified: Secondary | ICD-10-CM | POA: Diagnosis not present

## 2022-08-18 DIAGNOSIS — S3992XA Unspecified injury of lower back, initial encounter: Secondary | ICD-10-CM | POA: Diagnosis present

## 2022-08-18 LAB — I-STAT CHEM 8, ED
BUN: 5 mg/dL — ABNORMAL LOW (ref 6–20)
Calcium, Ion: 1.21 mmol/L (ref 1.15–1.40)
Chloride: 103 mmol/L (ref 98–111)
Creatinine, Ser: 0.6 mg/dL (ref 0.44–1.00)
Glucose, Bld: 87 mg/dL (ref 70–99)
HCT: 42 % (ref 36.0–46.0)
Hemoglobin: 14.3 g/dL (ref 12.0–15.0)
Potassium: 3.9 mmol/L (ref 3.5–5.1)
Sodium: 139 mmol/L (ref 135–145)
TCO2: 27 mmol/L (ref 22–32)

## 2022-08-18 LAB — I-STAT BETA HCG BLOOD, ED (MC, WL, AP ONLY): I-stat hCG, quantitative: <5 m[IU]/mL (ref <5)

## 2022-08-18 MED ORDER — NAPROXEN 500 MG PO TABS: 500.0000 mg | ORAL_TABLET | Freq: Two times a day (BID) | ORAL | 0 refills | Status: DC | PRN

## 2022-08-18 MED ORDER — KETOROLAC TROMETHAMINE 30 MG/ML IJ SOLN
15.0000 mg | Freq: Once | INTRAMUSCULAR | Status: AC
  Administered 2022-08-18: 15 mg via INTRAVENOUS
  Filled 2022-08-18: qty 1

## 2022-08-18 MED ORDER — SODIUM CHLORIDE (PF) 0.9 % IJ SOLN
INTRAMUSCULAR | Status: AC
  Filled 2022-08-18: qty 50

## 2022-08-18 MED ORDER — HYDROCODONE-ACETAMINOPHEN 5-325 MG PO TABS
1.0000 | ORAL_TABLET | Freq: Once | ORAL | Status: AC
  Administered 2022-08-18: 1 via ORAL
  Filled 2022-08-18: qty 1

## 2022-08-18 MED ORDER — METHOCARBAMOL 500 MG PO TABS: 500.0000 mg | ORAL_TABLET | Freq: Three times a day (TID) | ORAL | 0 refills | Status: DC | PRN

## 2022-08-18 MED ORDER — IOHEXOL 350 MG/ML SOLN
100.0000 mL | Freq: Once | INTRAVENOUS | Status: AC | PRN
  Administered 2022-08-18: 100 mL via INTRAVENOUS

## 2022-08-18 NOTE — ED Triage Notes (Signed)
Pt repots left upper back pain near shoulder blade that is worse with exhalation and lifting her son. Ibuprofen last night with no relief.

## 2022-08-18 NOTE — Discharge Instructions (Signed)
Testing is reassuring. No evidence of blood clot in the lung or pneumonia. Take the muscle relaxers and antiinflammatories as prescribed. Return to the ED with exertional chest pain, pain associated with shortness of breath, vomiting, sweating or any other concerns.

## 2022-08-18 NOTE — ED Provider Notes (Signed)
[facility] DEPT Provider Note   CSN: [id number] Arrival date & time: 08/18/22  5784     History  Chief Complaint  Patient presents with   Back Pain    Pam Vargas is a 41 y.o. female.  Patient presents with left-sided upper back, scapula and mid back pain that onset yesterday while she was "reaching for toothpaste".  Denies any specific injury.  She has been lifting her son who weighs 27 pounds but denies any known injury.  The pain is to her left scapula" that radiates up and down her entire back.  Taking ibuprofen without relief.  No weakness or numbness in her arm.  No chest pain.  No shortness of breath, cough or fever.  No lower abdominal pain or flank pain. No history of IV drug abuse or cancer.  No bowel or bladder incontinence.  The history is provided by the patient.  Back Pain Associated symptoms: no abdominal pain, no dysuria, no fever, no headaches and no weakness        Home Medications Prior to Admission medications   Medication Sig Start Date End Date Taking? Authorizing Provider  doxycycline (VIBRA-TABS) 100 MG tablet Take 1 tablet (100 mg total) by mouth 2 (two) times daily. 08/02/22   Martyn Malay, MD  metoprolol tartrate (LOPRESSOR) 25 MG tablet TAKE 1/2 TABLET(12.5 MG) BY MOUTH TWICE DAILY 08/18/22   Chandrasekhar, Mahesh A, MD  metroNIDAZOLE (FLAGYL) 500 MG tablet Take 1 tablet (500 mg total) by mouth 2 (two) times daily. 08/01/22   Martyn Malay, MD      Allergies    Patient has no known allergies.    Review of Systems   Review of Systems  Constitutional:  Negative for activity change, appetite change and fever.  Respiratory:  Negative for cough and shortness of breath.   Gastrointestinal:  Negative for abdominal pain, nausea and vomiting.  Genitourinary:  Negative for dysuria and hematuria.  Musculoskeletal:  Positive for arthralgias, back pain and myalgias.  Skin:  Negative for rash.  Neurological:   Negative for dizziness, weakness and headaches.    all other systems are negative except as noted in the HPI and PMH.   Physical Exam Updated Vital Signs BP (!) 109/37 (BP Location: Left Arm)   Pulse 90   Temp 98.4 F (36.9 C) (Oral)   Resp 18   LMP 08/10/2022   SpO2 95%  Physical Exam Vitals and nursing note reviewed.  Constitutional:      General: She is not in acute distress.    Appearance: She is well-developed.  HENT:     Head: Normocephalic and atraumatic.     Mouth/Throat:     Pharynx: No oropharyngeal exudate.  Eyes:     Conjunctiva/sclera: Conjunctivae normal.     Pupils: Pupils are equal, round, and reactive to light.  Neck:     Comments: No meningismus. Cardiovascular:     Rate and Rhythm: Normal rate and regular rhythm.     Heart sounds: Normal heart sounds. No murmur heard. Pulmonary:     Effort: Pulmonary effort is normal. No respiratory distress.     Breath sounds: Normal breath sounds.  Abdominal:     Palpations: Abdomen is soft.     Tenderness: There is no abdominal tenderness. There is no guarding or rebound.  Musculoskeletal:        General: Swelling and tenderness present. Normal range of motion.     Cervical back: Normal range of motion  and neck supple.     Comments: Paraspinal thoracic and lumbar tenderness on the left.  No midline tenderness.  Skin:    General: Skin is warm.  Neurological:     General: No focal deficit present.     Mental Status: She is alert and oriented to person, place, and time. Mental status is at baseline.     Cranial Nerves: No cranial nerve deficit.     Motor: No abnormal muscle tone.     Coordination: Coordination normal.     Comments:  5/5 strength throughout. CN 2-12 intact.Equal grip strength.   Psychiatric:        Behavior: Behavior normal.     ED Results / Procedures / Treatments   Labs (all labs ordered are listed, but only abnormal results are displayed) Labs Reviewed  I-STAT CHEM 8, ED - Abnormal;  Notable for the following components:      Result Value   BUN 5 (*)    All other components within normal limits  I-STAT BETA HCG BLOOD, ED ([facility], [facility], [facility] ONLY)    EKG EKG Interpretation  Date/Time:  Thursday August 18 2022 11:53:27 EDT Ventricular Rate:  66 PR Interval:  201 QRS Duration: 92 QT Interval:  435 QTC Calculation: 456 R Axis:   79 Text Interpretation: Sinus rhythm No significant change was found Confirmed by Ezequiel Essex [phone]) on 08/18/2022 12:38:23 PM  Radiology CT Angio Chest/Abd/Pel for Dissection W and/or Wo Contrast  Result Date: 08/18/2022 CLINICAL DATA:  Left upper back pain near shoulder blade. Pain is worse with acceleration and lifting her son. EXAM: CT ANGIOGRAPHY CHEST, ABDOMEN AND PELVIS TECHNIQUE: Non-contrast CT of the chest was initially obtained. Multidetector CT imaging through the chest, abdomen and pelvis was performed using the standard protocol during bolus administration of intravenous contrast. Multiplanar reconstructed images and MIPs were obtained and reviewed to evaluate the vascular anatomy. RADIATION DOSE REDUCTION: This exam was performed according to the departmental dose-optimization program which includes automated exposure control, adjustment of the mA and/or kV according to patient size and/or use of iterative reconstruction technique. CONTRAST:  110m OMNIPAQUE IOHEXOL 350 MG/ML SOLN COMPARISON:  None Available. FINDINGS: CTA CHEST FINDINGS Cardiovascular: Preferential opacification of the thoracic aorta. No evidence of thoracic aortic aneurysm or dissection. Normal heart size. No pericardial effusion. Mediastinum/Nodes: No enlarged mediastinal, hilar, or axillary lymph nodes. Thyroid gland, trachea, and esophagus demonstrate no significant findings. Lungs/Pleura: Lungs are clear. No pleural effusion or pneumothorax. Musculoskeletal: No chest wall abnormality. No acute or significant osseous findings. Review of the MIP images confirms the  above findings. CTA ABDOMEN AND PELVIS FINDINGS VASCULAR Aorta: Normal caliber aorta without aneurysm, dissection, vasculitis or significant stenosis. Celiac: Patent without evidence of aneurysm, dissection, vasculitis or significant stenosis. SMA: Patent without evidence of aneurysm, dissection, vasculitis or significant stenosis. Renals: Both renal arteries are patent without evidence of aneurysm, dissection, vasculitis, fibromuscular dysplasia or significant stenosis. IMA: Patent without evidence of aneurysm, dissection, vasculitis or significant stenosis. Inflow: Patent without evidence of aneurysm, dissection, vasculitis or significant stenosis. Veins: No obvious venous abnormality within the limitations of this arterial phase study. Review of the MIP images confirms the above findings. NON-VASCULAR Hepatobiliary: No focal liver abnormality is seen. No gallstones, gallbladder wall thickening, or biliary dilatation. Pancreas: Unremarkable. No pancreatic ductal dilatation or surrounding inflammatory changes. Spleen: Normal in size without focal abnormality. Adrenals/Urinary Tract: Adrenal glands are unremarkable. Kidneys are normal, without renal calculi, focal lesion, or hydronephrosis. Bladder is unremarkable. Stomach/Bowel: Stomach is within  normal limits. Appendix appears normal. No evidence of bowel wall thickening, distention, or inflammatory changes. Lymphatic: No lymphadenopathy. Reproductive: Uterus and bilateral adnexa are unremarkable. Other: No abdominal wall hernia or abnormality. No abdominopelvic ascites. Musculoskeletal: No acute or significant osseous findings. Review of the MIP images confirms the above findings. IMPRESSION: 1. No evidence of thoracic or abdominal aortic aneurysm or dissection. 2. Lungs are clear. 3. No CT evidence of acute abdominal/pelvic process. Electronically Signed   By: Keane Police D.O.   On: 08/18/2022 14:51   DG Chest 2 View  Result Date: 08/18/2022 CLINICAL DATA:   41 year old female with history of shortness of breath and back pain. EXAM: CHEST - 2 VIEW COMPARISON:  Chest x-ray 04/03/2022. FINDINGS: Lung volumes are normal. No consolidative airspace disease. No pleural effusions. No pneumothorax. No pulmonary nodule or mass noted. Pulmonary vasculature and the cardiomediastinal silhouette are within normal limits. IMPRESSION: No radiographic evidence of acute cardiopulmonary disease. Electronically Signed   By: Vinnie Langton M.D.   On: 08/18/2022 12:22    Procedures Procedures    Medications Ordered in ED Medications  ketorolac (TORADOL) 30 MG/ML injection 15 mg (has no administration in time range)    ED Course/ Medical Decision Making/ A&P                           Medical Decision Making Amount and/or Complexity of Data Reviewed Labs: ordered. Decision-making details documented in ED Course. Radiology: ordered and independent interpretation performed. Decision-making details documented in ED Course. ECG/medicine tests: ordered and independent interpretation performed. Decision-making details documented in ED Course.  Risk Prescription drug management.   Sudden onset diffuse back pain involving left scapula, paraspinal tenderness involving left thoracic and lumbar area.  Neurologically intact.  EKG without acute ischemia.  Chest x-ray is negative for pneumothorax or rib fracture.  No wide mediastinum  Work-up is negative for aortic dissection or aneurysm.  Low suspicion for ACS or pulmonary embolism.  No hypoxia or increased work of breathing.  Treated supportively for musculoskeletal back pain and spasm with anti-inflammatories and muscle relaxers.  Return precautions discussed.        Final Clinical Impression(s) / ED Diagnoses Final diagnoses:  Back strain, initial encounter    Rx / DC Orders ED Discharge Orders     None         Yarexi Pawlicki, Annie Main, MD 08/18/22 1612

## 2022-08-26 ENCOUNTER — Encounter: Payer: Medicaid Other | Admitting: Student

## 2022-08-26 ENCOUNTER — Ambulatory Visit (HOSPITAL_COMMUNITY)
Admission: EM | Admit: 2022-08-26 | Discharge: 2022-08-26 | Disposition: A | Discharge status: Home / Self Care | Payer: Medicaid Other | Attending: Nurse Practitioner | Admitting: Nurse Practitioner

## 2022-08-26 ENCOUNTER — Encounter (HOSPITAL_COMMUNITY): Payer: Self-pay

## 2022-08-26 DIAGNOSIS — Z1152 Encounter for screening for COVID-19: Secondary | ICD-10-CM | POA: Insufficient documentation

## 2022-08-26 DIAGNOSIS — J069 Acute upper respiratory infection, unspecified: Secondary | ICD-10-CM

## 2022-08-26 DIAGNOSIS — R0981 Nasal congestion: Secondary | ICD-10-CM | POA: Insufficient documentation

## 2022-08-26 DIAGNOSIS — R059 Cough, unspecified: Secondary | ICD-10-CM | POA: Insufficient documentation

## 2022-08-26 LAB — RESP PANEL BY RT-PCR (RSV, FLU A&B, COVID)  RVPGX2
Influenza A by PCR: NEGATIVE
Influenza B by PCR: NEGATIVE
Resp Syncytial Virus by PCR: NEGATIVE
SARS Coronavirus 2 by RT PCR: NEGATIVE

## 2022-08-26 LAB — POCT RAPID STREP A, ED / UC: Streptococcus, Group A Screen (Direct): NEGATIVE

## 2022-08-26 MED ORDER — BENZONATATE 100 MG PO CAPS: 100.0000 mg | ORAL_CAPSULE | Freq: Three times a day (TID) | ORAL | 0 refills | Status: DC

## 2022-08-26 NOTE — ED Triage Notes (Signed)
Patient having congestion and cough onset yesterday. Patient states her 43 year old has a runny nose. Productive cough with yellow/green mucus.

## 2022-08-26 NOTE — Discharge Instructions (Addendum)
Your Strep Test is negative.  Your COVID, Influenza  is pending. We will call you with the results and additional treatment options.  You may have an Upper Respiratory Infection.  We encourage conservative treatment with symptom relief. We encourage you to use Tylenol alternating with Ibuprofen for your fever if not contraindicated. (Remember to use as directed do not exceed daily dosing recommendations) We also encourage salt water gargles for your sore throat. You should also consider throat lozenges and chloraseptic spray.  Your cough can be soothed with a cough suppressant. We have prescribed you a Benzonatate 100 mg three times a day as needed

## 2022-08-26 NOTE — ED Provider Notes (Addendum)
[facility]    CSN: [id number] Arrival date & time: 08/26/22  0815      History   Chief Complaint Chief Complaint  Patient presents with   Cough   Nasal Congestion    HPI Pam Vargas is a 41 y.o. female.   HPI  She is having congestion, productive cough with thick mucous that started on yesterday. She is having chest discomfort with the cough. Exposure positive for cold symptoms from infant son. She is a smoker. Denies exposure to COVID/Influenza/Strep. Denies fever, chills, nasal congestion, sneezing, runny nose, cough, new sore throat, new loss of smell or taste,   nausea, or diarrhea.  The current treatment has been OTC Robitussin which seemed to make the cough worse.  Past Medical History:  Diagnosis Date   Advanced maternal age in multigravida    Alpha thalassemia silent carrier    Anemia    Blood transfusion without reported diagnosis    "I think so with my first delivery"   Cervical cerclage suture present 06/19/2020   06/19/20: ppx mcdonald cerclage with mersilene tape. Knot anteriorly.    Cervical mass 04/2020   Preterm labor    PROM (premature rupture of membranes) 08/28/2020   Sinus tachycardia     Patient Active Problem List   Diagnosis Date Noted   SVT (supraventricular tachycardia)    Alpha thalassemia silent carrier 06/19/2020   Single nabothian cyst 06/03/2020   Benign cyst of breast 06/05/2017   TOBACCO USER 11/02/2009   Persistent cough for 3 weeks or longer 10/03/2008    Past Surgical History:  Procedure Laterality Date   CERVICAL CERCLAGE N/A 06/19/2020   Procedure: CERCLAGE CERVICAL;  Surgeon: Aletha Halim, MD;  Location: [facility] LD [facility];  Service: Gynecology;  Laterality: N/A;   CESAREAN SECTION     FOOT SURGERY     FOOT SURGERY      OB History     Gravida  6   Para  3   Term  1   Preterm  0   AB  2   Living  2      SAB  2   IAB  0   Ectopic  0   Multiple  0   Live Births  2            Home  Medications    Prior to Admission medications   Medication Sig Start Date End Date Taking? Authorizing Provider  benzonatate (TESSALON) 100 MG capsule Take 1 capsule (100 mg total) by mouth 3 (three) times daily. 08/26/22  Yes Cheyne Boulden, Diona Foley, NP  metoprolol tartrate (LOPRESSOR) 25 MG tablet TAKE 1/2 TABLET(12.5 MG) BY MOUTH TWICE DAILY 08/18/22  Yes Chandrasekhar, Mahesh A, MD  doxycycline (VIBRA-TABS) 100 MG tablet Take 1 tablet (100 mg total) by mouth 2 (two) times daily. 08/02/22   Martyn Malay, MD  methocarbamol (ROBAXIN) 500 MG tablet Take 1 tablet (500 mg total) by mouth every 8 (eight) hours as needed for muscle spasms. 08/18/22   Rancour, Annie Main, MD  metroNIDAZOLE (FLAGYL) 500 MG tablet Take 1 tablet (500 mg total) by mouth 2 (two) times daily. 08/01/22   Martyn Malay, MD  naproxen (NAPROSYN) 500 MG tablet Take 1 tablet (500 mg total) by mouth 2 (two) times daily as needed. 08/18/22   Ezequiel Essex, MD    Family History Family History  Problem Relation Age of Onset   Diabetes Mother    Hyperlipidemia Mother    Hypertension Mother  Heart disease Father    Hypertension Father    Breast cancer Maternal Aunt    Colon cancer Paternal Aunt     Social History Social History   Tobacco Use   Smoking status: Every Day    Types: Cigarettes    Last attempt to quit: 05/29/2020    Years since quitting: 2.2   Smokeless tobacco: Never   Tobacco comments:    6 cigarettes per day  Vaping Use   Vaping Use: Never used  Substance Use Topics   Alcohol use: Not Currently    Comment: last drink 04/21/2018   Drug use: No     Allergies   Patient has no known allergies.   Review of Systems Review of Systems   Physical Exam Triage Vital Signs ED Triage Vitals  Enc Vitals Group     BP 08/26/22 0856 (!) 108/58     Pulse Rate 08/26/22 0856 91     Resp 08/26/22 0856 16     Temp 08/26/22 0856 98.6 F (37 C)     Temp Source 08/26/22 0856 Oral     SpO2 08/26/22 0856 100 %      Weight --      Height --      Head Circumference --      Peak Flow --      Pain Score 08/26/22 0855 8     Pain Loc --      Pain Edu? --      Excl. in [place]? --    No data found.  Updated Vital Signs BP (!) 108/58 (BP Location: Left Arm)   Pulse 91   Temp 98.6 F (37 C) (Oral)   Resp 16   LMP 08/10/2022 (Exact Date) Comment: Negative HCG blood test 08-18-2022  SpO2 100%   Visual Acuity Right Eye Distance:   Left Eye Distance:   Bilateral Distance:    Right Eye Near:   Left Eye Near:    Bilateral Near:     Physical Exam Constitutional:      Appearance: She is normal weight.  HENT:     Head: Normocephalic and atraumatic.     Right Ear: Tympanic membrane normal.     Left Ear: Tympanic membrane normal.     Nose: Nose normal.     Mouth/Throat:     Mouth: Mucous membranes are moist.     Pharynx: No oropharyngeal exudate or posterior oropharyngeal erythema.  Eyes:     Extraocular Movements: Extraocular movements intact.     Pupils: Pupils are equal, round, and reactive to light.  Cardiovascular:     Rate and Rhythm: Normal rate.     Pulses: Normal pulses.     Heart sounds: Normal heart sounds.  Pulmonary:     Effort: Pulmonary effort is normal.     Breath sounds: No wheezing, rhonchi or rales.     Comments: Diminished  Musculoskeletal:        General: Normal range of motion.     Cervical back: Normal range of motion.  Skin:    General: Skin is warm and dry.     Capillary Refill: Capillary refill takes less than 2 seconds.  Neurological:     General: No focal deficit present.     Mental Status: She is alert and oriented to person, place, and time.  Psychiatric:        Mood and Affect: Mood normal.        Behavior: Behavior normal.  UC Treatments / Results  Labs (all labs ordered are listed, but only abnormal results are displayed) Labs Reviewed  RESP PANEL BY RT-PCR (RSV, FLU A&B, COVID)  [id number]  POCT RAPID STREP A, ED / UC     EKG   Radiology No results found.  Procedures Procedures (including critical care time)  Medications Ordered in UC Medications - No data to display  Initial Impression / Assessment and Plan / UC Course  I have reviewed the triage vital signs and the nursing notes.  Pertinent labs & imaging results that were available during my care of the patient were reviewed by me and considered in my medical decision making (see chart for details).     Cough and congestion Final Clinical Impressions(s) / UC Diagnoses   Final diagnoses:  URI with cough and congestion  Viral upper respiratory tract infection     Discharge Instructions      Your Strep Test is negative.  Your COVID, Influenza  is pending. We will call you with the results and additional treatment options.  You may have an Upper Respiratory Infection.  We encourage conservative treatment with symptom relief. We encourage you to use Tylenol alternating with Ibuprofen for your fever if not contraindicated. (Remember to use as directed do not exceed daily dosing recommendations) We also encourage salt water gargles for your sore throat. You should also consider throat lozenges and chloraseptic spray.  Your cough can be soothed with a cough suppressant. We have prescribed you a Benzonatate 100 mg three times a day as needed       ED Prescriptions     Medication Sig Dispense Auth. Provider   benzonatate (TESSALON) 100 MG capsule Take 1 capsule (100 mg total) by mouth 3 (three) times daily. 30 capsule Vevelyn Francois, NP      PDMP not reviewed this encounter.   Vevelyn Francois, NP 08/26/22 [place], [place], NP 08/26/22 1239

## 2022-09-08 ENCOUNTER — Encounter (HOSPITAL_COMMUNITY): Payer: Self-pay

## 2022-09-08 ENCOUNTER — Ambulatory Visit (HOSPITAL_COMMUNITY)
Admission: EM | Admit: 2022-09-08 | Discharge: 2022-09-08 | Disposition: A | Discharge status: Home / Self Care | Payer: Medicaid Other | Attending: Emergency Medicine | Admitting: Emergency Medicine

## 2022-09-08 DIAGNOSIS — N898 Other specified noninflammatory disorders of vagina: Secondary | ICD-10-CM

## 2022-09-08 DIAGNOSIS — R21 Rash and other nonspecific skin eruption: Secondary | ICD-10-CM

## 2022-09-08 NOTE — Discharge Instructions (Signed)
We will call you if anything on your swab returns positive. Please abstain from sexual intercourse until your results return.  Please return with any concerns or new symptoms. You can follow up with your primary care provider as needed.

## 2022-09-08 NOTE — ED Triage Notes (Signed)
Pt is here for vaginal irritation x few days

## 2022-09-08 NOTE — ED Provider Notes (Signed)
[place]    CSN: [id number] Arrival date & time: 09/08/22  1751      History   Chief Complaint Chief Complaint  Patient presents with   vaginal irritation    HPI MARKETTA VALADEZ is a 41 y.o. female.  Presents with 3 day history of vaginal irritation Denies discharge, urinary symptoms, abd pain Would like STD testing Some "red spots" on the external genitalia she noticed about 4 days ago, not painful No history of HSV  LMP 11/2, finished 2 days ago  Gonorrhea and BV last month treated with abx  Past Medical History:  Diagnosis Date   Advanced maternal age in multigravida    Alpha thalassemia silent carrier    Anemia    Blood transfusion without reported diagnosis    "I think so with my first delivery"   Cervical cerclage suture present 06/19/2020   06/19/20: ppx mcdonald cerclage with mersilene tape. Knot anteriorly.    Cervical mass 04/2020   Preterm labor    PROM (premature rupture of membranes) 08/28/2020   Sinus tachycardia     Patient Active Problem List   Diagnosis Date Noted   SVT (supraventricular tachycardia)    Alpha thalassemia silent carrier 06/19/2020   Single nabothian cyst 06/03/2020   Benign cyst of breast 06/05/2017   TOBACCO USER 11/02/2009   Persistent cough for 3 weeks or longer 10/03/2008    Past Surgical History:  Procedure Laterality Date   CERVICAL CERCLAGE N/A 06/19/2020   Procedure: CERCLAGE CERVICAL;  Surgeon: Aletha Halim, MD;  Location: [facility];  Service: Gynecology;  Laterality: N/A;   CESAREAN SECTION     FOOT SURGERY     FOOT SURGERY      OB History     Gravida  6   Para  3   Term  1   Preterm  0   AB  2   Living  2      SAB  2   IAB  0   Ectopic  0   Multiple  0   Live Births  2            Home Medications    Prior to Admission medications   Medication Sig Start Date End Date Taking? Authorizing Provider  benzonatate (TESSALON) 100 MG capsule Take 1 capsule (100 mg  total) by mouth 3 (three) times daily. 08/26/22   Vevelyn Francois, NP  doxycycline (VIBRA-TABS) 100 MG tablet Take 1 tablet (100 mg total) by mouth 2 (two) times daily. 08/02/22   Martyn Malay, MD  methocarbamol (ROBAXIN) 500 MG tablet Take 1 tablet (500 mg total) by mouth every 8 (eight) hours as needed for muscle spasms. 08/18/22   Rancour, Annie Main, MD  metoprolol tartrate (LOPRESSOR) 25 MG tablet TAKE 1/2 TABLET(12.5 MG) BY MOUTH TWICE DAILY 08/18/22   Chandrasekhar, Mahesh A, MD  metroNIDAZOLE (FLAGYL) 500 MG tablet Take 1 tablet (500 mg total) by mouth 2 (two) times daily. 08/01/22   Martyn Malay, MD  naproxen (NAPROSYN) 500 MG tablet Take 1 tablet (500 mg total) by mouth 2 (two) times daily as needed. 08/18/22   Ezequiel Essex, MD    Family History Family History  Problem Relation Age of Onset   Diabetes Mother    Hyperlipidemia Mother    Hypertension Mother    Heart disease Father    Hypertension Father    Breast cancer Maternal Aunt    Colon cancer Paternal Aunt  Social History Social History   Tobacco Use   Smoking status: Every Day    Types: Cigarettes    Last attempt to quit: 05/29/2020    Years since quitting: 2.2   Smokeless tobacco: Never   Tobacco comments:    6 cigarettes per day  Vaping Use   Vaping Use: Never used  Substance Use Topics   Alcohol use: Not Currently    Comment: last drink 04/21/2018   Drug use: No     Allergies   Patient has no known allergies.   Review of Systems Review of Systems Per HPI  Physical Exam Triage Vital Signs ED Triage Vitals  Enc Vitals Group     BP 09/08/22 1846 99/66     Pulse Rate 09/08/22 1846 77     Resp 09/08/22 1846 12     Temp 09/08/22 1846 98 F (36.7 C)     Temp Source 09/08/22 1846 Oral     SpO2 09/08/22 1846 98 %     Weight --      Height --      Head Circumference --      Peak Flow --      Pain Score 09/08/22 1844 0     Pain Loc --      Pain Edu? --      Excl. in [place]? --    No data  found.  Updated Vital Signs BP 99/66 (BP Location: Left Arm)   Pulse 77   Temp 98 F (36.7 C) (Oral)   Resp 12   LMP 09/01/2022 (Exact Date) Comment: Negative HCG blood test 08-18-2022  SpO2 98%    Physical Exam Vitals and nursing note reviewed. Exam conducted with a chaperone present Kendrick Fries CMA).  Constitutional:      General: She is not in acute distress.    Appearance: Normal appearance.  HENT:     Mouth/Throat:     Pharynx: Oropharynx is clear.  Cardiovascular:     Rate and Rhythm: Normal rate and regular rhythm.     Pulses: Normal pulses.  Pulmonary:     Effort: Pulmonary effort is normal.  Genitourinary:    Exam position: Lithotomy position.       Comments: Groups of small erythematous, somewhat ulcerated spots in above areas. Non tender to light touch but painful with pressure. In area where glutes/thighs rub together Neurological:     Mental Status: She is alert and oriented to person, place, and time.     UC Treatments / Results  Labs (all labs ordered are listed, but only abnormal results are displayed) Labs Reviewed  HSV CULTURE AND TYPING  CERVICOVAGINAL ANCILLARY ONLY    EKG   Radiology No results found.  Procedures Procedures (including critical care time)  Medications Ordered in UC Medications - No data to display  Initial Impression / Assessment and Plan / UC Course  I have reviewed the triage vital signs and the nursing notes.  Pertinent labs & imaging results that were available during my care of the patient were reviewed by me and considered in my medical decision making (see chart for details).  Cyto swab pending Unknown etiology of the genital rash, HSV swab pending Does not have classic HSV look so will hold off on treatment for now Could be ?chaffing/irritation especially given distribution  Discussed with patient we will treat as needed, for now avoid harsh soaps in the area, use just water to clean externally, monitor  symptoms Can see PCP if needed  Return precautions discussed. Patient agrees to plan  Final Clinical Impressions(s) / UC Diagnoses   Final diagnoses:  Vaginal irritation  Rash of genital area     Discharge Instructions      We will call you if anything on your swab returns positive. Please abstain from sexual intercourse until your results return.  Please return with any concerns or new symptoms. You can follow up with your primary care provider as needed.     ED Prescriptions   None    PDMP not reviewed this encounter.   Kyra Leyland 09/08/22 1914

## 2022-09-09 LAB — CERVICOVAGINAL ANCILLARY ONLY
Bacterial Vaginitis (gardnerella): NEGATIVE
Candida Glabrata: NEGATIVE
Candida Vaginitis: NEGATIVE
Chlamydia: NEGATIVE
Comment: NEGATIVE
Comment: NEGATIVE
Comment: NEGATIVE
Comment: NEGATIVE
Comment: NEGATIVE
Comment: NORMAL
Neisseria Gonorrhea: NEGATIVE
Trichomonas: NEGATIVE

## 2022-09-11 LAB — HSV CULTURE AND TYPING

## 2022-09-12 ENCOUNTER — Ambulatory Visit: Payer: Medicaid Other

## 2022-09-12 ENCOUNTER — Other Ambulatory Visit: Payer: Medicaid Other

## 2022-09-12 ENCOUNTER — Ambulatory Visit (INDEPENDENT_AMBULATORY_CARE_PROVIDER_SITE_OTHER): Payer: Medicaid Other | Admitting: Student

## 2022-09-12 VITALS — BP 110/64 | HR 79 | Ht 64.0 in | Wt 126.0 lb

## 2022-09-12 DIAGNOSIS — B009 Herpesviral infection, unspecified: Secondary | ICD-10-CM

## 2022-09-12 DIAGNOSIS — N898 Other specified noninflammatory disorders of vagina: Secondary | ICD-10-CM

## 2022-09-12 NOTE — Progress Notes (Unsigned)
  SUBJECTIVE:   CHIEF COMPLAINT / HPI:   Red spots on vagina, itching and some burning with urination when it runs past. Notes she was bit by a bed bug, on the 3rd and everything went haywaire after that. Went to urgent care on Thursday, and was tested for STI's and all was negative. Symptoms began a little over a week ago, on the 4 th, after her bug bite. Lesions never drain any fluid or bleed, she denies any vaginal discharge. Denies any odor or new partners. Patient seen at uregent care on the 9th, with negative labs for HSV, Gon/Chlam/Trch/BV/Candida  PERTINENT  PMH / PSH: ***  Past Medical History:  Diagnosis Date   Advanced maternal age in multigravida    Alpha thalassemia silent carrier    Anemia    Blood transfusion without reported diagnosis    "I think so with my first delivery"   Cervical cerclage suture present 06/19/2020   06/19/20: ppx mcdonald cerclage with mersilene tape. Knot anteriorly.    Cervical mass 04/2020   Preterm labor    PROM (premature rupture of membranes) 08/28/2020   Sinus tachycardia     OBJECTIVE:  BP 110/64   Pulse 79   Ht '5\' 4"'$  (1.626 m)   Wt 126 lb (57.2 kg)   LMP 09/01/2022 (Exact Date) Comment: Negative HCG blood test 08-18-2022  SpO2 96%   BMI 21.63 kg/m  Physical Exam   ASSESSMENT/PLAN:  There are no diagnoses linked to this encounter. No follow-ups on file. Holley Bouche, MD 09/12/2022, 9:35 AM PGY-***, [facility] {    This will disappear when note is signed, click to select method of visit    :1}

## 2022-09-12 NOTE — Patient Instructions (Addendum)
It was great to see you! Thank you for allowing me to participate in your care!  Our plans for today:  - We are testing you for Herpes, HIV, and Syphilis - Sits Baths  30 min daily  We are checking some labs today, I will call you if they are abnormal will send you a MyChart message or a letter if they are normal.  If you do not hear about your labs in the next 2 weeks please let us know.  Take care and seek immediate care sooner if you develop any concerns.   Dr. Holley Bouche, MD [place]

## 2022-09-13 ENCOUNTER — Encounter: Payer: Self-pay | Admitting: Student

## 2022-09-13 LAB — RPR W/REFLEX TO TREPSURE: RPR: NONREACTIVE

## 2022-09-13 LAB — HSV 1 AND 2 AB, IGG
HSV 1 Glycoprotein G Ab, IgG: <0.91 index (ref 0.00–0.90)
HSV 2 IgG, Type Spec: 11.8 index — ABNORMAL HIGH (ref 0.00–0.90)

## 2022-09-13 LAB — T PALLIDUM ANTIBODY, EIA: T pallidum Antibody, EIA: NEGATIVE

## 2022-09-13 LAB — HIV ANTIBODY (ROUTINE TESTING W REFLEX): HIV Screen 4th Generation wRfx: NONREACTIVE

## 2022-09-14 ENCOUNTER — Encounter (HOSPITAL_COMMUNITY): Payer: Self-pay | Admitting: Emergency Medicine

## 2022-09-14 ENCOUNTER — Ambulatory Visit (HOSPITAL_COMMUNITY)
Admission: EM | Admit: 2022-09-14 | Discharge: 2022-09-14 | Disposition: A | Discharge status: Home / Self Care | Payer: Medicaid Other | Attending: Emergency Medicine | Admitting: Emergency Medicine

## 2022-09-14 ENCOUNTER — Encounter: Payer: Self-pay | Admitting: Student

## 2022-09-14 DIAGNOSIS — A6009 Herpesviral infection of other urogenital tract: Secondary | ICD-10-CM

## 2022-09-14 MED ORDER — VALACYCLOVIR HCL 1 G PO TABS: 1000.0000 mg | ORAL_TABLET | Freq: Three times a day (TID) | ORAL | 0 refills | Status: DC

## 2022-09-14 NOTE — ED Triage Notes (Signed)
Pt c/o vaginal itching, burning and red spots that started last week. Reports seen here last week and saw PCP MOnday and still having issue. All tests were negative.

## 2022-09-14 NOTE — ED Provider Notes (Signed)
[place]    CSN: [id number] Arrival date & time: 09/14/22  1532      History   Chief Complaint Chief Complaint  Patient presents with   Vaginal Itching    HPI Pam Vargas is a 41 y.o. female.   Patient presents with persistent vaginal itching, erythematous redness lesions and burning the external labia worsen by urination for 7 days.  Evaluated in urgent care on 09/08/2022, HSV culture and STI labs negative, reevaluate on 1113 in office by PCP at work has been obtained, has not reviewed lab work with PCP.  Endorses that she is not able to get any relief.  Known exposure to HSV from ex-partner,, was notified of this exposure in 2012, has been in a monogamous relationship with husband since, denies infidelity by her or her partner.  Denies vaginal discharge, urinary frequency, urgency, dysuria, hematuria.  Past Medical History:  Diagnosis Date   Advanced maternal age in multigravida    Alpha thalassemia silent carrier    Anemia    Blood transfusion without reported diagnosis    "I think so with my first delivery"   Cervical cerclage suture present 06/19/2020   06/19/20: ppx mcdonald cerclage with mersilene tape. Knot anteriorly.    Cervical mass 04/2020   Preterm labor    PROM (premature rupture of membranes) 08/28/2020   Sinus tachycardia     Patient Active Problem List   Diagnosis Date Noted   SVT (supraventricular tachycardia)    Alpha thalassemia silent carrier 06/19/2020   Single nabothian cyst 06/03/2020   Benign cyst of breast 06/05/2017   TOBACCO USER 11/02/2009   Persistent cough for 3 weeks or longer 10/03/2008    Past Surgical History:  Procedure Laterality Date   CERVICAL CERCLAGE N/A 06/19/2020   Procedure: CERCLAGE CERVICAL;  Surgeon: Aletha Halim, MD;  Location: [facility];  Service: Gynecology;  Laterality: N/A;   CESAREAN SECTION     FOOT SURGERY     FOOT SURGERY      OB History     Gravida  6   Para  3   Term  1    Preterm  0   AB  2   Living  2      SAB  2   IAB  0   Ectopic  0   Multiple  0   Live Births  2            Home Medications    Prior to Admission medications   Medication Sig Start Date End Date Taking? Authorizing Provider  valACYclovir (VALTREX) 1000 MG tablet Take 1 tablet (1,000 mg total) by mouth 3 (three) times daily for 14 days. 09/14/22 09/28/22 Yes Suheyla Mortellaro R, NP  benzonatate (TESSALON) 100 MG capsule Take 1 capsule (100 mg total) by mouth 3 (three) times daily. 08/26/22   Vevelyn Francois, NP  doxycycline (VIBRA-TABS) 100 MG tablet Take 1 tablet (100 mg total) by mouth 2 (two) times daily. 08/02/22   Martyn Malay, MD  methocarbamol (ROBAXIN) 500 MG tablet Take 1 tablet (500 mg total) by mouth every 8 (eight) hours as needed for muscle spasms. 08/18/22   Rancour, Annie Main, MD  metoprolol tartrate (LOPRESSOR) 25 MG tablet TAKE 1/2 TABLET(12.5 MG) BY MOUTH TWICE DAILY 08/18/22   Chandrasekhar, Mahesh A, MD  metroNIDAZOLE (FLAGYL) 500 MG tablet Take 1 tablet (500 mg total) by mouth 2 (two) times daily. 08/01/22   Martyn Malay, MD  naproxen (NAPROSYN) 500  MG tablet Take 1 tablet (500 mg total) by mouth 2 (two) times daily as needed. 08/18/22   Rancour, Annie Main, MD    Family History Family History  Problem Relation Age of Onset   Diabetes Mother    Hyperlipidemia Mother    Hypertension Mother    Heart disease Father    Hypertension Father    Breast cancer Maternal Aunt    Colon cancer Paternal Aunt     Social History Social History   Tobacco Use   Smoking status: Every Day    Types: Cigarettes    Last attempt to quit: 05/29/2020    Years since quitting: 2.2   Smokeless tobacco: Never   Tobacco comments:    6 cigarettes per day  Vaping Use   Vaping Use: Never used  Substance Use Topics   Alcohol use: Not Currently    Comment: last drink 04/21/2018   Drug use: No     Allergies   Patient has no known allergies.   Review of  Systems Review of Systems   Physical Exam Triage Vital Signs ED Triage Vitals  Enc Vitals Group     BP 09/14/22 1624 108/74     Pulse Rate 09/14/22 1624 63     Resp 09/14/22 1624 14     Temp 09/14/22 1624 98 F (36.7 C)     Temp Source 09/14/22 1624 Oral     SpO2 09/14/22 1624 100 %     Weight --      Height --      Head Circumference --      Peak Flow --      Pain Score 09/14/22 1622 8     Pain Loc --      Pain Edu? --      Excl. in [place]? --    No data found.  Updated Vital Signs BP 108/74 (BP Location: Right Arm)   Pulse 63   Temp 98 F (36.7 C) (Oral)   Resp 14   LMP 08/03/2022 (Exact Date) Comment: Negative HCG blood test 08-18-2022  SpO2 100%   Visual Acuity Right Eye Distance:   Left Eye Distance:   Bilateral Distance:    Right Eye Near:   Left Eye Near:    Bilateral Near:     Physical Exam Constitutional:      Appearance: Normal appearance.  Eyes:     Extraocular Movements: Extraocular movements intact.  Pulmonary:     Effort: Pulmonary effort is normal.  Genitourinary:    Comments: deferred Neurological:     Mental Status: She is alert and oriented to person, place, and time. Mental status is at baseline.      UC Treatments / Results  Labs (all labs ordered are listed, but only abnormal results are displayed) Labs Reviewed - No data to display  EKG   Radiology No results found.  Procedures Procedures (including critical care time)  Medications Ordered in UC Medications - No data to display  Initial Impression / Assessment and Plan / UC Course  I have reviewed the triage vital signs and the nursing notes.  Pertinent labs & imaging results that were available during my care of the patient were reviewed by me and considered in my medical decision making (see chart for details).  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, discussed findings with patient, even though past 72-hour window will prescribe  valacyclovir due to persistent discomfort, given written handout on  HSV-2 and blister care, patient endorses high levels of stress due to father's recent death, this most likely is the trigger, may follow-up with his urgent care as needed if symptoms persist or worsen Final Clinical Impressions(s) / UC Diagnoses   Final diagnoses:  Herpes simplex of female genitalia     Discharge Instructions      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, it has most likely been dormant in your body for years and has become flared due to your high levels of stress recently, HSV-2 can typically flare during high levels of stress or during times of illness, ideally would like to start medicine within the first 72 hours therefore if you begin to see lesions at any point again please come in to be evaluated as soon as possible  Begin valacyclovir every 8 hours (3 times daily) for 7 days, you can will have tablets left over if you flare again in the future, ideally will begin to see improvement in the next 24 to 48 hours and steady progression from there  May hold ice over the affected area in 10 to 15-minute intervals for comfort, may also complete warm soaks if helpful  In monogamous relationship for the last 20 years your partner has already been exposed, typically we do not test for the herpes virus if you do not have a lesion, please reach out to his primary doctor to have blood work completed if you would like to move forward with this option     ED Prescriptions     Medication Sig Dispense Auth. Provider   valACYclovir (VALTREX) 1000 MG tablet Take 1 tablet (1,000 mg total) by mouth 3 (three) times daily for 14 days. 42 tablet Honestie Kulik, Leitha Schuller, NP      PDMP not reviewed this encounter.   Hans Eden, NP 09/14/22 1651

## 2022-09-14 NOTE — Telephone Encounter (Signed)
Called to speak to patient about test results. 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 She reports the father of her children had HSV2, but she never had a flare before. Patient is married to different partner now and told him about her dx. He was going to get tested. Informed patient she could shed viral particles and spread HSV to partner even if she used condoms, or was not having active lesions. Patient asked provider if her children should be checked, as one of them was a vaginal birth and the other two were c-section. Informed patient it's unlikely she passed on infection, prior to having her first flare. Patient notes she went to urgent care today, where she got valacyclovir for her flare. Patient was bothered by the news, but did not seem to be in distress, and had accepted the dx. Patient overall sounded well, and was pleasant.

## 2022-09-14 NOTE — Discharge Instructions (Signed)
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, it has most likely been dormant in your body for years and has become flared due to your high levels of stress recently, HSV-2 can typically flare during high levels of stress or during times of illness, ideally would like to start medicine within the first 72 hours therefore if you begin to see lesions at any point again please come in to be evaluated as soon as possible  Begin valacyclovir every 8 hours (3 times daily) for 7 days, you can will have tablets left over if you flare again in the future, ideally will begin to see improvement in the next 24 to 48 hours and steady progression from there  May hold ice over the affected area in 10 to 15-minute intervals for comfort, may also complete warm soaks if helpful  In monogamous relationship for the last 20 years your partner has already been exposed, typically we do not test for the herpes virus if you do not have a lesion, please reach out to his primary doctor to have blood work completed if you would like to move forward with this option

## 2022-09-15 DIAGNOSIS — B009 Herpesviral infection, unspecified: Secondary | ICD-10-CM | POA: Insufficient documentation

## 2022-09-15 NOTE — Assessment & Plan Note (Addendum)
Patient presents with multiple vaginal ulcers and discomfort for over a week. Patient denies new sexual partners, and has been sexually active with current partner/husband. Patient recently tested for HSV, Gonorrhea, Chlamydia, BV, and Trich, but had negative results. Will test patient for Syphillis, retest for HSV, and HIV. Labs were positive for HSV2, patient having initial flare. Patient reports that a previous partner had HSV and she likely got it from him. Also reports increase in social stressors. Patient saw results and went to uregent care for tx and has been started on valtrex.  -HSV -RPR -HIV -Start Valtrex

## 2022-09-18 ENCOUNTER — Ambulatory Visit (HOSPITAL_COMMUNITY)
Admission: EM | Admit: 2022-09-18 | Discharge: 2022-09-18 | Disposition: A | Discharge status: Home / Self Care | Payer: Medicaid Other | Attending: Emergency Medicine | Admitting: Emergency Medicine

## 2022-09-18 ENCOUNTER — Encounter (HOSPITAL_COMMUNITY): Payer: Self-pay

## 2022-09-18 DIAGNOSIS — L089 Local infection of the skin and subcutaneous tissue, unspecified: Secondary | ICD-10-CM

## 2022-09-18 DIAGNOSIS — R21 Rash and other nonspecific skin eruption: Secondary | ICD-10-CM

## 2022-09-18 MED ORDER — CEPHALEXIN 500 MG PO CAPS: 500.0000 mg | ORAL_CAPSULE | Freq: Two times a day (BID) | ORAL | 0 refills | Status: AC

## 2022-09-18 MED ORDER — MUPIROCIN 2 % EX OINT: TOPICAL_OINTMENT | Freq: Two times a day (BID) | CUTANEOUS | 0 refills | Status: DC

## 2022-09-18 NOTE — Discharge Instructions (Addendum)
Use the cream as directed.  Give it about 4 to 5 days to start working.  If you notice no improvement in symptoms after this time period, you can fill the paper prescription for the antibiotic.  Take as prescribed.  Please return with any concerns or follow-up with primary care provider.

## 2022-09-18 NOTE — ED Provider Notes (Signed)
[place]    CSN: [id number] Arrival date & time: 09/18/22  1653      History   Chief Complaint Chief Complaint  Patient presents with   Follow-up    HPI Pam Vargas is a 41 y.o. female.  Presents for follow up of genital rash Seen at urgent care 11/9, unknown etiology at that time but HSV swab returned negative. Seen by PCP on 11/13 who did HSV blood test which resulted positive.  She came back to urgent care 11/15 and was placed on valtrex  Here today reporting no improvement in rash or pain Denies any spreading or worsening No fevers No urinary/bowel symptoms Denies vaginal discharge    Past Medical History:  Diagnosis Date   Advanced maternal age in multigravida    Alpha thalassemia silent carrier    Anemia    Blood transfusion without reported diagnosis    "I think so with my first delivery"   Cervical cerclage suture present 06/19/2020   06/19/20: ppx mcdonald cerclage with mersilene tape. Knot anteriorly.    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   SVT (supraventricular tachycardia)    Alpha thalassemia silent carrier 06/19/2020   Single nabothian cyst 06/03/2020   Benign cyst of breast 06/05/2017   TOBACCO USER 11/02/2009   Persistent cough for 3 weeks or longer 10/03/2008    Past Surgical History:  Procedure Laterality Date   CERVICAL CERCLAGE N/A 06/19/2020   Procedure: CERCLAGE CERVICAL;  Surgeon: Aletha Halim, MD;  Location: [facility];  Service: Gynecology;  Laterality: N/A;   CESAREAN SECTION     FOOT SURGERY     FOOT SURGERY      OB History     Gravida  6   Para  3   Term  1   Preterm  0   AB  2   Living  2      SAB  2   IAB  0   Ectopic  0   Multiple  0   Live Births  2            Home Medications    Prior  to Admission medications   Medication Sig Start Date End Date Taking? Authorizing Provider  cephALEXin (KEFLEX) 500 MG capsule Take 1 capsule (500 mg total) by mouth 2 (two) times daily for 5 days. 09/18/22 09/23/22 Yes Yosselyn Tax, Wells Guiles, PA-C  mupirocin ointment (BACTROBAN) 2 % Apply topically 2 (two) times daily. Apply generous amount to the area twice daily 09/18/22  Yes Jalan Bodi, Wells Guiles, PA-C  benzonatate (TESSALON) 100 MG capsule Take 1 capsule (100 mg total) by mouth 3 (three) times daily. 08/26/22   Vevelyn Francois, NP  methocarbamol (ROBAXIN) 500 MG tablet Take 1 tablet (500 mg total) by mouth every 8 (eight) hours as needed for muscle spasms. 08/18/22   Rancour, Annie Main, MD  metoprolol tartrate (LOPRESSOR) 25 MG tablet TAKE 1/2 TABLET(12.5 MG) BY MOUTH TWICE DAILY 08/18/22   Chandrasekhar, Mahesh A, MD  naproxen (NAPROSYN) 500 MG tablet Take 1 tablet (500 mg total) by mouth 2 (two) times daily as needed. 08/18/22   Ezequiel Essex, MD    Family History Family History  Problem Relation Age of Onset   Diabetes Mother    Hyperlipidemia Mother  Hypertension Mother    Heart disease Father    Hypertension Father    Breast cancer Maternal Aunt    Colon cancer Paternal Aunt     Social History Social History   Tobacco Use   Smoking status: Every Day    Types: Cigarettes    Last attempt to quit: 05/29/2020    Years since quitting: 2.3   Smokeless tobacco: Never   Tobacco comments:    6 cigarettes per day  Vaping Use   Vaping Use: Never used  Substance Use Topics   Alcohol use: Not Currently    Comment: last drink 04/21/2018   Drug use: No     Allergies   Patient has no known allergies.   Review of Systems Review of Systems Per HPI  Physical Exam Triage Vital Signs ED Triage Vitals [09/18/22 1818]  Enc Vitals Group     BP 111/70     Pulse Rate 76     Resp 16     Temp 98.1 F (36.7 C)     Temp Source Oral     SpO2 99 %     Weight      Height      Head  Circumference      Peak Flow      Pain Score      Pain Loc      Pain Edu?      Excl. in [place]?    No data found.  Updated Vital Signs BP 111/70 (BP Location: Left Arm)   Pulse 76   Temp 98.1 F (36.7 C) (Oral)   Resp 16   LMP 08/03/2022 (Exact Date) Comment: Negative HCG blood test 08-18-2022  SpO2 99%     Physical Exam Vitals and nursing note reviewed. Exam conducted with a chaperone present [facility] CMA).  Constitutional:      General: She is not in acute distress. Cardiovascular:     Rate and Rhythm: Normal rate and regular rhythm.  Pulmonary:     Effort: Pulmonary effort is normal.  Genitourinary:      Comments: Somewhat improved from appearance on 11/9 but lesions still present and painful. Does not appear to be HSV at this time Skin:    General: Skin is warm and dry.     Findings: Rash present.  Neurological:     Mental Status: She is alert and oriented to person, place, and time.     UC Treatments / Results  Labs (all labs ordered are listed, but only abnormal results are displayed) Labs Reviewed - No data to display  EKG  Radiology No results found.  Procedures Procedures   Medications Ordered in UC Medications - No data to display  Initial Impression / Assessment and Plan / UC Course  I have reviewed the triage vital signs and the nursing notes.  Pertinent labs & imaging results that were available during my care of the patient were reviewed by me and considered in my medical decision making (see chart for details).  This provider examined patient at her first UC visit, at that time was unsure if HSV or another genital rash. On visit today, rash does appear improved from before but does not have the classic HSV appearance to it. Discussed not likely HSV especially given the valtrex she was prescribed by another provider did not improve her pain/discomfort. We discussed her positive blood test does not indicate an active infection, likely reactive from  her many years ago exposure.   Rash does  not appear fungal, consider some sort of bacterial infection Bactroban twice daily, monitor for improvement Delayed prescription keflex BID for 5 days, discussed use if Bactroban alone does not provide relief after the first 4-5 days of use. Return precautions discussed. Patient agrees to plan  Final Clinical Impressions(s) / UC Diagnoses   Final diagnoses:  Superficial skin infection     Discharge Instructions      Use the cream as directed.  Give it about 4 to 5 days to start working.  If you notice no improvement in symptoms after this time period, you can fill the paper prescription for the antibiotic.  Take as prescribed.  Please return with any concerns or follow-up with primary care provider.    ED Prescriptions     Medication Sig Dispense Auth. Provider   mupirocin ointment (BACTROBAN) 2 % Apply topically 2 (two) times daily. Apply generous amount to the area twice daily 22 g Briannon Boggio, PA-C   cephALEXin (KEFLEX) 500 MG capsule Take 1 capsule (500 mg total) by mouth 2 (two) times daily for 5 days. 10 capsule Shalisha Clausing, Wells Guiles, PA-C      PDMP not reviewed this encounter.   [place], [place] 09/20/22 2233

## 2022-09-18 NOTE — ED Triage Notes (Signed)
Pt reports her HSV-outbreak is not getting any better. She is taking medication as prescribe.

## 2022-10-04 ENCOUNTER — Encounter: Payer: Self-pay | Admitting: Radiology

## 2022-10-04 ENCOUNTER — Ambulatory Visit: Payer: Medicaid Other | Admitting: Radiology

## 2022-10-04 VITALS — BP 110/64 | Ht 62.5 in | Wt 119.0 lb

## 2022-10-04 DIAGNOSIS — B009 Herpesviral infection, unspecified: Secondary | ICD-10-CM

## 2022-10-04 DIAGNOSIS — S9031XA Contusion of right foot, initial encounter: Secondary | ICD-10-CM | POA: Diagnosis not present

## 2022-10-04 DIAGNOSIS — M79671 Pain in right foot: Secondary | ICD-10-CM | POA: Diagnosis not present

## 2022-10-04 NOTE — Progress Notes (Signed)
   Pam Vargas [birth year] [id number]   History:  41 y.o. G7P3 referred for rash on buttock. Positive for HSV 09/12/22 with serology, negative culture 09/08/22 at first urgent care visit. Treated with valtrex and area has resolved.  Gynecologic History Patient's last menstrual period was 09/27/2022. Period Cycle (Days): 28 Period Duration (Days): 5 Period Pattern: Regular Menstrual Flow: Heavy Menstrual Control: Maxi pad Dysmenorrhea: None Contraception/Family planning: none Sexually active: yes   Obstetric History OB History  Gravida Para Term Preterm AB Living  '7 3 1 '$ 0 3 3  SAB IAB Ectopic Multiple Live Births  3 0 0 0 3    # Outcome Date GA Lbr Len/2nd Weight Sex Delivery Anes PTL Lv  7 SAB 2019 [redacted]w[redacted]d        6 SAB 05/2017 640w0d       5 Para 11/02/16 [id number] Vag-Spont None  FD     Complications: Premature rupture of membranes in pregnancy  4 Para 08/04/04 [id number] 3.7 oz (0.558 kg) M VBAC EPI  LIV     Birth Comments: had bleeding , went to hospital and had PTD  3 Term 02/19/98 [id number] (3.629 kg) M CS-LTranv   LIV     Birth Comments: wnl  2 SAB           1 Gravida              The following portions of the patient's history were reviewed and updated as appropriate: allergies, current medications, past family history, past medical history, past social history, past surgical history, and problem list.  Review of Systems Pertinent items noted in HPI and remainder of comprehensive ROS otherwise negative.   Past medical history, past surgical history, family history and social history were all reviewed and documented in the EPIC chart.   Exam:  Vitals:   10/04/22 1526  BP: 110/64  Weight: 119 lb (54 kg)  Height: 5' 2.5" (1.588 m)   Body mass index is 21.42 kg/m.  General appearance:  Normal Abdominal  Soft,nontender, without masses, guarding or rebound.  Liver/spleen:  No organomegaly noted  Hernia:  None appreciated  Skin  Inspection:  Grossly  normal Genitourinary   Inguinal/mons:  Normal without inguinal adenopathy  External genitalia:  Normal appearing vulva with no masses, tenderness, or lesions  BUS/Urethra/Skene's glands:  Normal without masses or exudate  Vagina:  Normal appearing with normal color and discharge, no lesions  Cervix:  Normal appearing without discharge or lesions  Uterus:  Normal in size, shape and contour.  Mobile, nontender  Adnexa/parametria:     Rt: Normal in size, without masses or tenderness.   Lt: Normal in size, without masses or tenderness.  Anus and perineum: Normal   Patient informed chaperone available to be present for breast and pelvic exam. Patient has requested no chaperone to be present. Patient has been advised what will be completed during breast and pelvic exam.   Assessment/Plan:   1. HSV-2 (herpes simplex virus 2) infection Resolved outbreak, return if sx reoccur for evaluation Discussed keeping immune system healthy to prevent outbreaks    CHRZARubbie BattiestNP-BC 3:51 PM 10/04/2022

## 2022-10-27 ENCOUNTER — Encounter (HOSPITAL_COMMUNITY): Payer: Self-pay

## 2022-10-27 ENCOUNTER — Emergency Department (HOSPITAL_COMMUNITY): Payer: Medicaid Other

## 2022-10-27 ENCOUNTER — Emergency Department (HOSPITAL_COMMUNITY)
Admission: EM | Admit: 2022-10-27 | Discharge: 2022-10-28 | Disposition: A | Discharge status: Home / Self Care | Payer: Medicaid Other | Attending: Student | Admitting: Student

## 2022-10-27 ENCOUNTER — Other Ambulatory Visit: Payer: Self-pay

## 2022-10-27 DIAGNOSIS — Z79899 Other long term (current) drug therapy: Secondary | ICD-10-CM | POA: Insufficient documentation

## 2022-10-27 DIAGNOSIS — R519 Headache, unspecified: Secondary | ICD-10-CM | POA: Insufficient documentation

## 2022-10-27 DIAGNOSIS — F17210 Nicotine dependence, cigarettes, uncomplicated: Secondary | ICD-10-CM | POA: Insufficient documentation

## 2022-10-27 MED ORDER — SODIUM CHLORIDE 0.9 % IV BOLUS
1000.0000 mL | Freq: Once | INTRAVENOUS | Status: AC
  Administered 2022-10-28: 1000 mL via INTRAVENOUS

## 2022-10-27 MED ORDER — KETOROLAC TROMETHAMINE 15 MG/ML IJ SOLN
15.0000 mg | Freq: Once | INTRAMUSCULAR | Status: AC
  Administered 2022-10-27: 15 mg via INTRAVENOUS
  Filled 2022-10-27: qty 1

## 2022-10-27 MED ORDER — DIPHENHYDRAMINE HCL 50 MG/ML IJ SOLN
25.0000 mg | Freq: Once | INTRAMUSCULAR | Status: AC
  Administered 2022-10-27: 25 mg via INTRAVENOUS
  Filled 2022-10-27: qty 1

## 2022-10-27 MED ORDER — PROCHLORPERAZINE EDISYLATE 10 MG/2ML IJ SOLN
10.0000 mg | Freq: Once | INTRAMUSCULAR | Status: AC
  Administered 2022-10-27: 10 mg via INTRAVENOUS
  Filled 2022-10-27: qty 2

## 2022-10-27 NOTE — ED Triage Notes (Signed)
Left sided headache x 3 days. Describes it as pressure behind left eye.

## 2022-10-27 NOTE — ED Provider Triage Note (Signed)
Emergency Medicine Provider Triage Evaluation Note  Pam Vargas , a 41 y.o. female  was evaluated in triage.  Pt complains of left-sided headache just superior to the left orbit this been ongoing for 3 days.  Headache has been constant and throbbing.  She reports associated phonophobia, photophobia, nausea.  She denies any focal weakness or numbness.  No recent fever, cough, chills, congestion, shortness of breath.  Review of Systems  Positive:  Negative: See above   Physical Exam  BP 113/75   Pulse 76   Temp 98.6 F (37 C) (Oral)   Resp 18   LMP 09/27/2022   SpO2 100%  Gen:   Awake, no distress   Resp:  Normal effort  MSK:   Moves extremities without difficulty  Other:  Cranial nerves II through XII are intact.  5/5 strength in the upper and lower extremities.  No pronator drift.  Normal sensation to the upper and lower extremities.  No dysmetria finger-to-nose.  Speech is normal  Medical Decision Making  Medically screening exam initiated at 10:45 PM.  Appropriate orders placed.  Pam Vargas was informed that the remainder of the evaluation will be completed by another provider, this initial triage assessment does not replace that evaluation, and the importance of remaining in the ED until their evaluation is complete.     Myna Bright [place], [place] 10/27/22 2246

## 2022-10-27 NOTE — ED Notes (Signed)
Patient transported to CT 

## 2022-10-28 NOTE — ED Provider Notes (Signed)
[facility] DEPT Provider Note  CSN: [id number] Arrival date & time: 10/27/22 2211  Chief Complaint(s) Headache  HPI Pam Vargas is a 41 y.o. female who presents emergency room for evaluation of a headache.  Patient states that she has had a throbbing headache for the last 72 hours with no associated nausea, vomiting, numbness, tingling, weakness or other neurologic or systemic complaints.  Denies cough, chest pain, shortness of breath or other systemic complaints.  Tried multiple over-the-counter medications at home without relief.  Headache gradual in onset with no thunderclap.   Past Medical History Past Medical History:  Diagnosis Date   Advanced maternal age in multigravida    Alpha thalassemia silent carrier    Anemia    Blood transfusion without reported diagnosis    "I think so with my first delivery"   Cervical cerclage suture present 06/19/2020   06/19/20: ppx mcdonald cerclage with mersilene tape. Knot anteriorly.    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 Authorizing Provider  metoprolol tartrate (LOPRESSOR) 25 MG tablet TAKE 1/2 TABLET(12.5 MG) BY MOUTH TWICE DAILY 08/18/22   Chandrasekhar, Mahesh A, MD  Multiple Vitamin (MULTIVITAMIN PO) Take by mouth.    [provider]                                                                                                                                    Past Surgical  History Past Surgical History:  Procedure Laterality Date   CERVICAL CERCLAGE N/A 06/19/2020   Procedure: CERCLAGE CERVICAL;  Surgeon: Aletha Halim, MD;  Location: [facility] LD ORS;  Service: Gynecology;  Laterality: N/A;   CESAREAN SECTION     FOOT SURGERY     FOOT SURGERY     Family History Family History  Problem Relation Age of Onset   Diabetes Mother    Hyperlipidemia Mother    Hypertension Mother    Heart disease Father    Hypertension Father    Breast cancer Maternal Aunt    Colon cancer Paternal Aunt     Social History Social History   Tobacco Use   Smoking status: Every Day    Packs/day: 0.25    Types: Cigarettes    Last attempt to quit: 05/29/2020    Years since quitting: 2.4   Smokeless tobacco: Never   Tobacco comments:    6 cigarettes per day  Vaping Use   Vaping Use: Never used  Substance Use Topics   Alcohol use: Yes    Comment: occasionally drinks wine   Drug use: No   Allergies Patient has no known allergies.  Review of Systems Review of Systems  Neurological:  Positive for headaches.    Physical Exam Vital Signs  I have reviewed the triage vital signs BP 125/80 (BP Location: Left Arm)   Pulse 66   Temp 98.6 F (37 C) (Oral)   Resp 17   LMP 09/27/2022   SpO2 99%   Physical Exam Vitals and nursing note reviewed.  Constitutional:      General: She is not in acute distress.    Appearance: She is well-developed.  HENT:     Head: Normocephalic and atraumatic.  Eyes:     Conjunctiva/sclera: Conjunctivae normal.  Cardiovascular:     Rate and Rhythm: Normal rate and regular rhythm.     Heart sounds: No murmur heard. Pulmonary:     Effort: Pulmonary effort is normal. No respiratory distress.     Breath sounds: Normal breath sounds.  Abdominal:     Palpations: Abdomen is soft.     Tenderness: There is no abdominal tenderness.  Musculoskeletal:        General: No swelling.     Cervical back: Neck supple.  Skin:    General: Skin is warm  and dry.     Capillary Refill: Capillary refill takes less than 2 seconds.  Neurological:     Mental Status: She is alert.     Cranial Nerves: No cranial nerve deficit.     Sensory: No sensory deficit.     Motor: No weakness.  Psychiatric:        Mood and Affect: Mood normal.     ED Results and Treatments Labs (all labs ordered are listed, but only abnormal results are displayed) Labs Reviewed - No data to display                                                                                                                        Radiology CT Head Wo Contrast  Result Date: 10/27/2022 CLINICAL DATA:  Sudden severe headache.  Pressure behind left eye EXAM: CT HEAD WITHOUT CONTRAST TECHNIQUE: Contiguous axial images were obtained from the base of the skull through the vertex without intravenous contrast. RADIATION DOSE REDUCTION: This exam was performed according to the departmental dose-optimization program which includes automated exposure control, adjustment of the mA and/or kV according to patient size and/or use of iterative reconstruction technique. COMPARISON:  07/14/2022 FINDINGS: Brain: No intracranial hemorrhage, mass effect, or evidence of acute infarct. No hydrocephalus. No extra-axial fluid collection. Vascular: No hyperdense vessel or unexpected calcification. Skull: No fracture or focal lesion. Sinuses/Orbits: No acute finding. Unremarkable orbits. Globes are intact. Other: None. IMPRESSION: No CT correlate for headache. Electronically Signed   By: Placido Sou M.D.   On: 10/27/2022 23:36    Pertinent labs & imaging results  that were available during my care of the patient were reviewed by me and considered in my medical decision making (see MDM for details).  Medications Ordered in ED Medications  sodium chloride 0.9 % bolus 1,000 mL (1,000 mLs Intravenous New Bag/Given 10/28/22 0354)  ketorolac (TORADOL) 15 MG/ML injection 15 mg (15 mg Intravenous Given 10/27/22 2319)   prochlorperazine (COMPAZINE) injection 10 mg (10 mg Intravenous Given 10/27/22 2319)  diphenhydrAMINE (BENADRYL) injection 25 mg (25 mg Intravenous Given 10/27/22 2319)                                                                                                                                     Procedures Procedures  (including critical care time)  Medical Decision Making / ED Course   This patient presents to the ED for concern of headache, this involves an extensive number of treatment options, and is a complaint that carries with it a high risk of complications and morbidity.  The differential diagnosis includes migraine, Cluster, Tension Ha, Dural venous thrombosis, Sinusitis, CO poisoning, HTN, Malignancy  MDM: Seen emergency room for evaluation of a headache.  Physical exam unremarkable.  CT head obtained given patient's age with new headache symptoms that was reassuringly negative.  Patient given headache cocktail and on reevaluation symptoms resolved.  I did offer the patient additional testing including COVID and flu testing to which she declined at this time.  Patient to discharge to outpatient follow-up and strict return precautions which she voiced understanding.  Low suspicion for intracranial bleed including subarachnoid hemorrhage given gradual onset symptoms and no neurologic deficits.   Additional history obtained: -Additional history obtained from partner -External records from outside source obtained and reviewed including: Chart review including previous notes, labs, imaging, consultation notes      Imaging Studies ordered: I ordered imaging studies including CT head I independently visualized and interpreted imaging. I agree with the radiologist interpretation   Medicines ordered and prescription drug management: Meds ordered this encounter  Medications   sodium chloride 0.9 % bolus 1,000 mL   ketorolac (TORADOL) 15 MG/ML injection 15 mg    prochlorperazine (COMPAZINE) injection 10 mg   diphenhydrAMINE (BENADRYL) injection 25 mg    -I have reviewed the patients home medicines and have made adjustments as needed  Critical interventions none    Cardiac Monitoring: The patient was maintained on a cardiac monitor.  I personally viewed and interpreted the cardiac monitored which showed an underlying rhythm of: NSR  Social Determinants of Health:  Factors impacting patients care include: none   Reevaluation: After the interventions noted above, I reevaluated the patient and found that they have :improved  Co morbidities that complicate the patient evaluation  Past Medical History:  Diagnosis Date   Advanced maternal age in multigravida    Alpha thalassemia silent carrier    Anemia    Blood transfusion without reported diagnosis    "I think  so with my first delivery"   Cervical cerclage suture present 06/19/2020   06/19/20: ppx mcdonald cerclage with mersilene tape. Knot anteriorly.    Cervical mass 04/2020   Preterm labor    PROM (premature rupture of membranes) 08/28/2020   Sinus tachycardia       Dispostion: I considered admission for this patient, but at this time she does not meet inpatient criteria for admission she is safe for discharge with outpatient follow-up     Final Clinical Impression(s) / ED Diagnoses Final diagnoses:  None     '@PCDICTATION'$ @    Teressa Lower, MD 10/28/22 [phone]

## 2022-11-14 ENCOUNTER — Other Ambulatory Visit: Payer: Self-pay

## 2022-11-23 ENCOUNTER — Other Ambulatory Visit: Payer: Self-pay

## 2022-11-23 ENCOUNTER — Ambulatory Visit: Payer: Medicaid Other | Admitting: Student

## 2022-11-23 ENCOUNTER — Encounter: Payer: Self-pay | Admitting: Student

## 2022-11-23 VITALS — BP 97/57 | HR 81 | Ht 64.0 in | Wt 120.0 lb

## 2022-11-23 DIAGNOSIS — B009 Herpesviral infection, unspecified: Secondary | ICD-10-CM

## 2022-11-23 MED ORDER — VALACYCLOVIR HCL 1 G PO TABS: 1000.0000 mg | ORAL_TABLET | Freq: Every day | ORAL | 0 refills | Status: DC

## 2022-11-23 NOTE — Progress Notes (Unsigned)
    SUBJECTIVE:   CHIEF COMPLAINT / HPI:   Bump on Labia Diagnosed with new HSV-2 of the genitals back in November.  Was treated with a course of Valtrex with resolution of her symptoms.  However, she subsequently developed a new bump on her right labia a few days ago.  Reports that it is not particularly painful nor itchy but she would like it examined today.  Was not sent home with a home supply of Valtrex with her initial infection.  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   OBJECTIVE:   BP (!) 97/57   Pulse 81   Ht '5\' 4"'$  (1.626 m)   Wt 120 lb (54.4 kg)   SpO2 100%   BMI 20.60 kg/m   Physical Exam Vitals reviewed.  Constitutional:      General: She is not in acute distress. HENT:     Mouth/Throat:     Mouth: Mucous membranes are moist.  Cardiovascular:     Rate and Rhythm: Normal rate and regular rhythm.     Heart sounds: No murmur heard.    No friction rub. No gallop.  Pulmonary:     Effort: Pulmonary effort is normal. No respiratory distress.     Breath sounds: No wheezing or rales.  Genitourinary:      Comments: Single vesicular lesion c/w genital herpes Musculoskeletal:        General: No swelling or tenderness.      ASSESSMENT/PLAN:   HSV-2 (herpes simplex virus 2) infection History and exam c/w HSV outbreak.  - Valtrex '1000mg'$  daily x5 days - Sent home with a supply to keep on hand for future outbreaks - Reviewed transmissability of HSV     Pearla Dubonnet, MD [place]

## 2022-11-23 NOTE — Patient Instructions (Signed)
The bump on your labia looks like herpes, probably reactivated because of all the stress you are under right now.  We can treat this with the same medication you had last time. I'm sending in a supply for you to keep at home. At the first sign of symptoms, you should take 1 tablet daily for five days.   Pearla Dubonnet, MD

## 2022-11-24 NOTE — Assessment & Plan Note (Signed)
History and exam c/w HSV outbreak.  - Valtrex '1000mg'$  daily x5 days - Sent home with a supply to keep on hand for future outbreaks - Reviewed transmissability of HSV

## 2022-12-26 ENCOUNTER — Ambulatory Visit
Admission: RE | Admit: 2022-12-26 | Discharge: 2022-12-26 | Disposition: A | Discharge status: Home / Self Care | Payer: Medicaid Other | Source: Ambulatory Visit | Attending: Family Medicine | Admitting: Family Medicine

## 2022-12-26 DIAGNOSIS — Z1231 Encounter for screening mammogram for malignant neoplasm of breast: Secondary | ICD-10-CM | POA: Diagnosis not present

## 2022-12-26 DIAGNOSIS — Z0000 Encounter for general adult medical examination without abnormal findings: Secondary | ICD-10-CM

## 2022-12-29 ENCOUNTER — Other Ambulatory Visit (HOSPITAL_COMMUNITY)
Admission: RE | Admit: 2022-12-29 | Discharge: 2022-12-29 | Disposition: A | Discharge status: Home / Self Care | Payer: Medicaid Other | Source: Ambulatory Visit | Attending: Family Medicine | Admitting: Family Medicine

## 2022-12-29 ENCOUNTER — Ambulatory Visit (INDEPENDENT_AMBULATORY_CARE_PROVIDER_SITE_OTHER): Payer: Medicaid Other | Admitting: Family Medicine

## 2022-12-29 VITALS — BP 100/60 | HR 77 | Ht 64.0 in | Wt 128.4 lb

## 2022-12-29 DIAGNOSIS — N898 Other specified noninflammatory disorders of vagina: Secondary | ICD-10-CM

## 2022-12-29 DIAGNOSIS — R350 Frequency of micturition: Secondary | ICD-10-CM

## 2022-12-29 LAB — POCT WET PREP (WET MOUNT)
Clue Cells Wet Prep Whiff POC: NEGATIVE
Trichomonas Wet Prep HPF POC: ABSENT

## 2022-12-29 LAB — POCT URINALYSIS DIP (CLINITEK)
Bilirubin, UA: NEGATIVE
Blood, UA: NEGATIVE
Glucose, UA: NEGATIVE mg/dL
Ketones, POC UA: NEGATIVE mg/dL
Leukocytes, UA: NEGATIVE
Nitrite, UA: NEGATIVE
POC PROTEIN,UA: 30 — AB
Spec Grav, UA: 1.025 (ref 1.010–1.025)
Urobilinogen, UA: 0.2 E.U./dL
pH, UA: 7 (ref 5.0–8.0)

## 2022-12-29 LAB — POCT UA - MICROSCOPIC ONLY
RBC, Urine, Miroscopic: NONE SEEN (ref 0–2)
WBC, Ur, HPF, POC: NONE SEEN (ref 0–5)

## 2022-12-29 NOTE — Patient Instructions (Addendum)
It was nice seeing you today!  So far your wet prep is negative for yeast, BV, and trichomonas.  Other tests will result within the next few days.  Urine does not show any signs of infection.  Stay well, Zola Button, MD [place] [phone]  --  Make sure to check out at the front desk before you leave today.  Please arrive at least 15 minutes prior to your scheduled appointments.  If you had blood work today, I will send you a MyChart message or a letter if results are normal. Otherwise, I will give you a call.  If you had a referral placed, they will call you to set up an appointment. Please give [place] a call if you don't hear back in the next 2 weeks.  If you need additional refills before your next appointment, please call your pharmacy first.

## 2022-12-29 NOTE — Progress Notes (Signed)
    SUBJECTIVE:   CHIEF COMPLAINT / HPI:  Chief Complaint  Patient presents with   STD testing    Vaginal Itching    Patient with vaginal itching as well as urinary frequency for the past 3 to 4 days and requesting STI testing. No new sexual partners.  She has been with the same partner for 20 years. Denies vaginal discharge, fever, chills, dysuria. Declines contraception.  She uses condoms.  PERTINENT  PMH / PSH: HSV-2  Patient Care Team: Holley Bouche, MD as PCP - General (Family Medicine)   OBJECTIVE:   BP 100/60   Pulse 77   Ht 5' 4"$  (1.626 m)   Wt 128 lb 6.4 oz (58.2 kg)   LMP 12/16/2022   SpO2 96%   BMI 22.04 kg/m   Physical Exam Exam conducted with a chaperone present.  Constitutional:      General: She is not in acute distress. Cardiovascular:     Rate and Rhythm: Normal rate.  Pulmonary:     Effort: Pulmonary effort is normal. No respiratory distress.  Genitourinary:    General: Normal vulva.     Vagina: No vaginal discharge.     Comments: Cervix appears normal. Musculoskeletal:     Cervical back: Neck supple.  Neurological:     Mental Status: She is alert.         12/29/2022    2:02 PM  Depression screen PHQ 2/9  Decreased Interest 0  Down, Depressed, Hopeless 0  PHQ - 2 Score 0  Altered sleeping 0  Tired, decreased energy 0  Change in appetite 0  Feeling bad or failure about yourself  0  Trouble concentrating 0  Moving slowly or fidgety/restless 0  Suicidal thoughts 0  PHQ-9 Score 0  Difficult doing work/chores Not difficult at all     {Show previous vital signs (optional):23777}    ASSESSMENT/PLAN:   1. Vaginal itching No discharge, will test for STI. - Cervicovaginal ancillary only - POCT Wet Prep [facility]) negative - HIV antibody (with reflex) - RPR - contraception declined  2. Urinary frequency Possible UTI, will check UA - POCT URINALYSIS DIP (CLINITEK), no sign of UTI  Return if symptoms worsen or fail to  improve.   Zola Button, MD [place]

## 2022-12-30 LAB — CERVICOVAGINAL ANCILLARY ONLY
Chlamydia: NEGATIVE
Comment: NEGATIVE
Comment: NEGATIVE
Comment: NORMAL
Neisseria Gonorrhea: NEGATIVE
Trichomonas: NEGATIVE

## 2022-12-30 LAB — RPR: RPR Ser Ql: NONREACTIVE

## 2022-12-30 LAB — HIV ANTIBODY (ROUTINE TESTING W REFLEX): HIV Screen 4th Generation wRfx: NONREACTIVE

## 2023-02-18 ENCOUNTER — Other Ambulatory Visit: Payer: Self-pay | Admitting: Internal Medicine

## 2023-04-18 ENCOUNTER — Ambulatory Visit: Payer: Medicaid Other | Attending: Internal Medicine | Admitting: Internal Medicine

## 2023-04-18 NOTE — Progress Notes (Deleted)
Cardiology Office Note:    Date:  04/18/2023   ID:  Pam Vargas, DOB 12/11/1980, MRN [id number]  PCP:  Bess Kinds, MD   [facility] Providers Cardiologist:  None { Click to update primary MD,subspecialty MD or APP then REFRESH:1}    Referring MD: Bess Kinds, MD   CC: SVT f/u  History of Present Illness:    Pam Vargas is a 42 y.o. female with a hx of SVT.  2022: doing well on metoprolol.  Her youngest left the NICU.  Patient notes that she is doing ***.   Since last visit notes *** . There are no*** interval hospital/ED visit.    No chest pain or pressure ***.  No SOB/DOE*** and no PND/Orthopnea***.  No weight gain or leg swelling***.  No palpitations or syncope ***.  Ambulatory blood pressure ***.   Past Medical History:  Diagnosis Date   Advanced maternal age in multigravida    Alpha thalassemia silent carrier    Anemia    Blood transfusion without reported diagnosis    "I think so with my first delivery"   Cervical cerclage suture present 06/19/2020   06/19/20: ppx mcdonald cerclage with mersilene tape. Knot anteriorly.    Cervical mass 04/2020   Preterm labor    PROM (premature rupture of membranes) 08/28/2020   Sinus tachycardia     Past Surgical History:  Procedure Laterality Date   CERVICAL CERCLAGE N/A 06/19/2020   Procedure: CERCLAGE CERVICAL;  Surgeon: [place] Bing, MD;  Location: [facility] LD [facility]S;  Service: Gynecology;  Laterality: N/A;   CESAREAN SECTION     FOOT SURGERY     FOOT SURGERY      Current Medications: No outpatient medications have been marked as taking for the 04/18/23 encounter (Appointment) with Christell Constant, MD.     Allergies:   Patient has no known allergies.   Social History   Socioeconomic History   Marital status: Married    Spouse name: Not on file   Number of children: Not on file   Years of education: Not on file   Highest education level: Not on file  Occupational History   Not  on file  Tobacco Use   Smoking status: Every Day    Packs/day: .25    Types: Cigarettes    Last attempt to quit: 05/29/2020    Years since quitting: 2.8   Smokeless tobacco: Never   Tobacco comments:    6 cigarettes per day  Vaping Use   Vaping Use: Never used  Substance and Sexual Activity   Alcohol use: Yes    Comment: occasionally drinks wine   Drug use: No   Sexual activity: Yes    Partners: Male    Birth control/protection: None    Comment: menarche 42yo, sexual debut 42yo  Other Topics Concern   Not on file  Social History Narrative   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, applications    Social Determinants of Corporate investment banker Strain: Not on file  Food Insecurity: No Food Insecurity (12/27/2021)   Hunger Vital Sign    Worried About Running Out of Food in the Last Year: Never true    Ran Out of Food in the Last Year: Never true  Transportation Needs: No Transportation Needs (12/27/2021)   PRAPARE - Administrator, Civil Service (Medical): No    Lack of Transportation (Non-Medical): No  Physical Activity: Not on file  Stress: Not on  file  Social Connections: Not on file    Social: Boy's names are Tyreek (22), Oshi (16) and Sincere; who is still in the NICU.   Family History: The patient's ***family history includes Breast cancer in her maternal aunt; Colon cancer in her paternal aunt; Diabetes in her mother; Heart disease in her father; Hyperlipidemia in her mother; Hypertension in her father and mother.  ROS:   Please see the history of present illness.    *** All other systems reviewed and are negative.  EKGs/Labs/Other Studies Reviewed:    The following studies were reviewed today:  Cardiac Studies & Procedures       ECHOCARDIOGRAM  ECHOCARDIOGRAM COMPLETE 10/14/2020  Narrative ECHOCARDIOGRAM REPORT    Patient Name:   Pam Vargas Date of Exam: 10/14/2020 Medical Rec #:  [id number]       Height:       64.0 in Accession #:     [id number]      Weight:       140.1 lb Date of Birth:  08/14/1981       BSA:          1.682 m Patient Age:    39 years        BP:           104/63 mmHg Patient Gender: F               HR:           71 bpm. Exam Location:  [address]  Procedure: 2D Echo, Cardiac Doppler and Color Doppler  Indications:    R00.2 Palpitations  History:        Patient has no prior history of Echocardiogram examinations. Risk Factors:Current Smoker. Sinus tachycardia. Pregnant. Anemia.  Sonographer:    Cathie Beams RCS Referring Phys: [id number] [facility] A Dorothee Napierkowski  IMPRESSIONS   1. Inferior basal hypokinesis . Left ventricular ejection fraction, by estimation, is 55%. The left ventricle has normal function. The left ventricle demonstrates regional wall motion abnormalities (see scoring diagram/findings for description). Left ventricular diastolic parameters were normal. 2. Right ventricular systolic function is normal. The right ventricular size is normal. 3. The mitral valve is normal in structure. Mild mitral valve regurgitation. No evidence of mitral stenosis. 4. The aortic valve is tricuspid. Aortic valve regurgitation is not visualized. No aortic stenosis is present. 5. The inferior vena cava is normal in size with greater than 50% respiratory variability, suggesting right atrial pressure of 3 mmHg.  FINDINGS Left Ventricle: Inferior basal hypokinesis. Left ventricular ejection fraction, by estimation, is 55%. The left ventricle has normal function. The left ventricle demonstrates regional wall motion abnormalities. The left ventricular internal cavity size was normal in size. There is no left ventricular hypertrophy. Left ventricular diastolic parameters were normal.  Right Ventricle: The right ventricular size is normal. No increase in right ventricular wall thickness. Right ventricular systolic function is normal.  Left Atrium: Left atrial size was normal in size.  Right Atrium: Right  atrial size was normal in size.  Pericardium: There is no evidence of pericardial effusion.  Mitral Valve: The mitral valve is normal in structure. There is mild thickening of the mitral valve leaflet(s). Mild mitral valve regurgitation. No evidence of mitral valve stenosis.  Tricuspid Valve: The tricuspid valve is normal in structure. Tricuspid valve regurgitation is trivial. No evidence of tricuspid stenosis.  Aortic Valve: The aortic valve is tricuspid. Aortic valve regurgitation is not visualized. No aortic stenosis is present.  Pulmonic Valve:  The pulmonic valve was normal in structure. Pulmonic valve regurgitation is not visualized. No evidence of pulmonic stenosis.  Aorta: The aortic root is normal in size and structure.  Venous: The inferior vena cava is normal in size with greater than 50% respiratory variability, suggesting right atrial pressure of 3 mmHg.  IAS/Shunts: No atrial level shunt detected by color flow Doppler.   LEFT VENTRICLE PLAX 2D LVIDd:         5.00 cm  Diastology LVIDs:         3.90 cm  LV e' medial:    7.40 cm/s LV PW:         0.80 cm  LV E/e' medial:  9.3 LV IVS:        0.70 cm  LV e' lateral:   8.27 cm/s LVOT diam:     2.10 cm  LV E/e' lateral: 8.3 LV SV:         56 LV SV Index:   33 LVOT Area:     3.46 cm   RIGHT VENTRICLE RV Basal diam:  2.20 cm RV S prime:     11.40 cm/s TAPSE (M-mode): 2.2 cm  LEFT ATRIUM             Index       RIGHT ATRIUM           Index LA diam:        2.70 cm 1.61 cm/m  RA Area:     10.50 cm LA Vol (A2C):   32.7 ml 19.44 ml/m RA Volume:   22.90 ml  13.62 ml/m LA Vol (A4C):   24.5 ml 14.57 ml/m LA Biplane Vol: 29.3 ml 17.42 ml/m AORTIC VALVE LVOT Vmax:   71.30 cm/s LVOT Vmean:  49.400 cm/s LVOT VTI:    0.162 m  AORTA Ao Root diam: 3.20 cm  MITRAL VALVE MV Area (PHT): 2.56 cm    SHUNTS MV Decel Time: 296 msec    Systemic VTI:  0.16 m MV E velocity: 68.60 cm/s  Systemic Diam: 2.10 cm MV A velocity:  78.40 cm/s MV E/A ratio:  0.88  Charlton Haws MD Electronically signed by Charlton Haws MD Signature Date/Time: 10/14/2020/11:38:45 AM    Final                   Recent Labs: 08/01/2022: Platelets 235 08/18/2022: BUN 5; Creatinine, Ser 0.60; Hemoglobin 14.3; Potassium 3.9; Sodium 139  Recent Lipid Panel    Component Value Date/Time   CHOL 156 08/01/2022 1219   TRIG 55 08/01/2022 1219   HDL 59 08/01/2022 1219   CHOLHDL 2.6 08/01/2022 1219   CHOLHDL 2.2 Ratio 10/10/2007 2120   VLDL 7 10/10/2007 2120   LDLCALC 86 08/01/2022 1219     Risk Assessment/Calculations:   {Does this patient have ATRIAL FIBRILLATION?[phone]}  No BP recorded.  {Refresh Note OR Click here to enter BP  :1}***         Physical Exam:    VS:  There were no vitals taken for this visit.    Wt Readings from Last 3 Encounters:  12/29/22 128 lb 6.4 oz (58.2 kg)  11/23/22 120 lb (54.4 kg)  10/04/22 119 lb (54 kg)     GEN: *** Well nourished, well developed in no acute distress HEENT: Normal NECK: No JVD; No carotid bruits LYMPHATICS: No lymphadenopathy CARDIAC: ***RRR, no murmurs, rubs, gallops RESPIRATORY:  Clear to auscultation without rales, wheezing or rhonchi  ABDOMEN: Soft, non-tender, non-distended MUSCULOSKELETAL:  No  edema; No deformity  SKIN: Warm and dry NEUROLOGIC:  Alert and oriented x 3 PSYCHIATRIC:  Normal affect   ASSESSMENT:    No diagnosis found. PLAN:    SVT - metoprolol ***  Former Tobacco USe      {Are you ordering a CV Procedure (e.g. stress test, cath, DCCV, TEE, etc)?   Press F2        :[id number]}    Medication Adjustments/Labs and Tests Ordered: Current medicines are reviewed at length with the patient today.  Concerns regarding medicines are outlined above.  No orders of the defined types were placed in this encounter.  No orders of the defined types were placed in this encounter.   There are no Patient Instructions on file for this visit.    Signed, Christell Constant, MD  04/18/2023 8:36 AM    [facility]

## 2023-04-19 ENCOUNTER — Encounter: Payer: Self-pay | Admitting: Internal Medicine

## 2023-04-22 ENCOUNTER — Encounter: Payer: Medicaid Other | Admitting: Nurse Practitioner

## 2023-04-22 NOTE — Progress Notes (Signed)
Trying to schedule appointment for her son. Cancelled this appointment. Mary-Margaret Daphine Deutscher, FNP

## 2023-06-06 ENCOUNTER — Telehealth: Payer: Self-pay | Admitting: Internal Medicine

## 2023-06-06 MED ORDER — METOPROLOL TARTRATE 25 MG PO TABS: 12.5000 mg | ORAL_TABLET | Freq: Two times a day (BID) | ORAL | 3 refills | Status: DC

## 2023-06-06 NOTE — Telephone Encounter (Signed)
*  STAT* If patient is at the pharmacy, call can be transferred to refill team.   1. Which medications need to be refilled? (please list name of each medication and dose if known)   metoprolol tartrate (LOPRESSOR) 25 MG tablet    2. Which pharmacy/location (including street and city if local pharmacy) is medication to be sent to?[id number] - [place], [place] - [address] AT [facility] OF [address]   3. Do they need a 30 day or 90 day supply? 90 day

## 2023-06-06 NOTE — Telephone Encounter (Signed)
Pt's medication was sent to pt's pharmacy as requested. Confirmation received.  °

## 2023-06-30 ENCOUNTER — Ambulatory Visit (INDEPENDENT_AMBULATORY_CARE_PROVIDER_SITE_OTHER): Payer: Medicaid Other | Admitting: Student

## 2023-06-30 ENCOUNTER — Other Ambulatory Visit (HOSPITAL_COMMUNITY)
Admission: RE | Admit: 2023-06-30 | Discharge: 2023-06-30 | Disposition: A | Discharge status: Home / Self Care | Payer: Medicaid Other | Source: Ambulatory Visit | Attending: Family Medicine | Admitting: Family Medicine

## 2023-06-30 ENCOUNTER — Other Ambulatory Visit: Payer: Self-pay

## 2023-06-30 VITALS — BP 108/58 | HR 82 | Ht 64.0 in | Wt 124.0 lb

## 2023-06-30 DIAGNOSIS — Z309 Encounter for contraceptive management, unspecified: Secondary | ICD-10-CM | POA: Diagnosis present

## 2023-06-30 DIAGNOSIS — N889 Noninflammatory disorder of cervix uteri, unspecified: Secondary | ICD-10-CM

## 2023-06-30 DIAGNOSIS — Z113 Encounter for screening for infections with a predominantly sexual mode of transmission: Secondary | ICD-10-CM

## 2023-06-30 DIAGNOSIS — L209 Atopic dermatitis, unspecified: Secondary | ICD-10-CM | POA: Diagnosis not present

## 2023-06-30 LAB — POCT URINE PREGNANCY: Preg Test, Ur: NEGATIVE

## 2023-06-30 MED ORDER — NORETHINDRONE 0.35 MG PO TABS: 1.0000 | ORAL_TABLET | Freq: Every day | ORAL | 11 refills | Status: DC

## 2023-06-30 MED ORDER — HYDROCORTISONE 2.5 % EX CREA: TOPICAL_CREAM | Freq: Two times a day (BID) | CUTANEOUS | 0 refills | Status: DC

## 2023-06-30 NOTE — Assessment & Plan Note (Signed)
Routine testing, no current concerning symptoms. -Vaginal swab for GC/chlamydia, trichomonas -HIV and RPR

## 2023-06-30 NOTE — Assessment & Plan Note (Signed)
Dry patch of skin most consistent with eczema.  No concerning features for fungal infection. -Trial Rx hydrocortisone cream twice daily, can also use Vaseline/Aquaphor -Return if no improvement

## 2023-06-30 NOTE — Assessment & Plan Note (Signed)
Concerning vascular lesion on the cervix.  Last Pap smear was normal in 2020. -Patient to be scheduled in colpo clinic

## 2023-06-30 NOTE — Patient Instructions (Signed)
It was great to see you! Thank you for allowing me to participate in your care!   Our plans for today:  -Birth control pills have been sent to your pharmacy -Other take a pregnancy test at home in 2 weeks or make a nurse only visit for a pregnancy test at our office to ensure you are not pregnant. -I prescribed hydrocortisone cream for the dry patches of skin on your face.  Use twice daily, can also apply Aquaphor or Vaseline twice daily.  If this does not improve within 2 weeks or worsens, please return  We are checking some labs today, I will call you if they are abnormal will send you a MyChart message or a letter if they are normal.  If you do not hear about your labs in the next 2 weeks please let us know.  Take care and seek immediate care sooner if you develop any concerns.   Dr. Erick Alley, DO [facility]

## 2023-06-30 NOTE — Progress Notes (Signed)
    SUBJECTIVE:   CHIEF COMPLAINT / HPI:   Contraception Patient request birth control pills. Periods last 5-7 days, heavy for first 2-3 days but then light, occur monthly  LMP 06/07/2023, last had intercourse 2-3 days ago and did not use condom. Declines morning after pill.  Current smoker, ~5 cigarettes/day   STD testing 1 partner for 20 years, no condoms No current concerning symptoms for STDs, wants routine testing to be safe   Dry patch of skin On R cheek Present for 6 months, itches and she scratches it a lot.  Used to be more red in color, now dark.  PERTINENT  PMH / PSH: Tobacco use disorder  OBJECTIVE:   BP (!) 108/58   Pulse 82   Ht 5\' 4"  (1.626 m)   Wt 124 lb (56.2 kg)   SpO2 100%   BMI 21.28 kg/m    General: NAD, pleasant, able to participate in exam Cardiac: well perfused Respiratory: normal effort Extremities: no edema or cyanosis. GU: Chaperoned by CMA.  Normal external female genitalia with no lesions.  Moist pink vaginal mucosa.  Cervix with nabothian cyst at 12:00 and ~2x2 cm vascular lesion at 9:00  Skin: Hyperpigmented dry patch of skin on right cheek.  Not well demarcated, no raised border or central clearing.  Neuro: alert, no obvious focal deficits Psych: Normal affect and mood  ASSESSMENT/PLAN:   Encounter for contraceptive management Over age 68 and currently smokes, will avoid estrogen containing birth control. UPT negative today but cannot completely rule out pregnancy due to LMP and date of last intercourse.  Offered Plan B which patient declined -Rx Micronor -Patient to take home pregnancy test in 2 weeks  Atopic dermatitis Dry patch of skin most consistent with eczema.  No concerning features for fungal infection. -Trial Rx hydrocortisone cream twice daily, can also use Vaseline/Aquaphor -Return if no improvement  Screen for STD (sexually transmitted disease) Routine testing, no current concerning symptoms. -Vaginal swab for  GC/chlamydia, trichomonas -HIV and RPR  Abnormal cervix finding Concerning vascular lesion on the cervix.  Last Pap smear was normal in 2020. -Patient to be scheduled in colpo clinic     Dr. Erick Alley, DO [facility]

## 2023-06-30 NOTE — Assessment & Plan Note (Signed)
Over age 42 and currently smokes, will avoid estrogen containing birth control. UPT negative today but cannot completely rule out pregnancy due to LMP and date of last intercourse.  Offered Plan B which patient declined -Rx Micronor -Patient to take home pregnancy test in 2 weeks

## 2023-07-01 LAB — RPR: RPR Ser Ql: NONREACTIVE

## 2023-07-01 LAB — HIV ANTIBODY (ROUTINE TESTING W REFLEX): HIV Screen 4th Generation wRfx: NONREACTIVE

## 2023-07-06 LAB — CERVICOVAGINAL ANCILLARY ONLY
Chlamydia: NEGATIVE
Comment: NEGATIVE
Comment: NEGATIVE
Comment: NORMAL
Neisseria Gonorrhea: NEGATIVE
Trichomonas: NEGATIVE

## 2023-07-13 ENCOUNTER — Ambulatory Visit: Payer: Medicaid Other | Admitting: Student

## 2023-07-13 ENCOUNTER — Encounter: Payer: Self-pay | Admitting: Student

## 2023-07-13 VITALS — BP 106/60 | HR 97 | Wt 119.0 lb

## 2023-07-13 DIAGNOSIS — N889 Noninflammatory disorder of cervix uteri, unspecified: Secondary | ICD-10-CM

## 2023-07-13 NOTE — Progress Notes (Cosign Needed)
  SUBJECTIVE:   CHIEF COMPLAINT / HPI:   F/u Cervix lesion -Patient seen 06/30/23 and noted to have lesion on cervix  Today: Patient comes in for Colposcopy, was unaware she had to schedule with our front office specifically. Patient was scheduled for Colposcopy today.    PERTINENT  PMH / PSH:   OBJECTIVE:  There were no vitals taken for this visit. Physical Exam  ASSESSMENT/PLAN:  There are no diagnoses linked to this encounter. No follow-ups on file. Bess Kinds, MD 07/13/2023, 12:34 PM PGY-3, [facility]

## 2023-07-13 NOTE — Patient Instructions (Signed)
It was great to see you! Thank you for allowing me to participate in your care!   We apologize for the confusion! We have you scheduled in our "Colposcopy Clinic" Follow up in Colposcopy Clinic  You have an appointment 10/3 at 9:30 am  Take care and seek immediate care sooner if you develop any concerns.   Dr. Bess Kinds, MD [facility]

## 2023-07-29 ENCOUNTER — Emergency Department (HOSPITAL_COMMUNITY)
Admission: EM | Admit: 2023-07-29 | Discharge: 2023-07-29 | Disposition: A | Discharge status: Home / Self Care | Payer: Medicaid Other | Attending: Emergency Medicine | Admitting: Emergency Medicine

## 2023-07-29 ENCOUNTER — Other Ambulatory Visit: Payer: Self-pay

## 2023-07-29 DIAGNOSIS — K0889 Other specified disorders of teeth and supporting structures: Secondary | ICD-10-CM | POA: Insufficient documentation

## 2023-07-29 MED ORDER — PENICILLIN V POTASSIUM 500 MG PO TABS: 500.0000 mg | ORAL_TABLET | Freq: Four times a day (QID) | ORAL | 0 refills | Status: AC

## 2023-07-29 NOTE — ED Triage Notes (Signed)
Pt arrived via POV. C/o dental pain for 2x days. States they popped a blister in their mouth and blood came out.  AOx4

## 2023-07-29 NOTE — ED Provider Notes (Signed)
[place] [facility] EMERGENCY DEPARTMENT AT [facility] Provider Note   CSN: [id number] Arrival date & time: 07/29/23  1333     History Chief Complaint  Patient presents with   Dental Pain    HPI Pam Vargas is a 42 y.o. female presenting for teeth pain. Family has had mouthsores and they share dinner utensils. Had an abscess that resulted in spontaneous rupture.   Patient's recorded medical, surgical, social, medication list and allergies were reviewed in the Snapshot window as part of the initial history.   Review of Systems   Review of Systems  Constitutional:  Negative for chills and fever.  HENT:  Positive for mouth sores. Negative for ear pain and sore throat.   Eyes:  Negative for pain and visual disturbance.  Respiratory:  Negative for cough and shortness of breath.   Cardiovascular:  Negative for chest pain and palpitations.  Gastrointestinal:  Negative for abdominal pain and vomiting.  Genitourinary:  Negative for dysuria and hematuria.  Musculoskeletal:  Negative for arthralgias and back pain.  Skin:  Negative for color change and rash.  Neurological:  Negative for seizures and syncope.  All other systems reviewed and are negative.   Physical Exam Updated Vital Signs BP (!) 119/59 (BP Location: Right Arm)   Pulse 76   Temp 99 F (37.2 C) (Oral)   Resp 16   Ht 5\' 4"  (1.626 m)   Wt 54.4 kg   LMP 06/07/2023   SpO2 100%   BMI 20.60 kg/m  Physical Exam Vitals and nursing note reviewed.  Constitutional:      General: She is not in acute distress.    Appearance: She is well-developed.  HENT:     Head: Normocephalic and atraumatic.     Mouth/Throat:     Comments: Pinpoint lesion above the front middle tooth. Eyes:     Conjunctiva/sclera: Conjunctivae normal.  Cardiovascular:     Rate and Rhythm: Normal rate and regular rhythm.     Heart sounds: No murmur heard. Pulmonary:     Effort: Pulmonary effort is normal. No respiratory distress.      Breath sounds: Normal breath sounds.  Abdominal:     General: There is no distension.     Palpations: Abdomen is soft.     Tenderness: There is no abdominal tenderness. There is no right CVA tenderness or left CVA tenderness.  Musculoskeletal:        General: No swelling or tenderness. Normal range of motion.     Cervical back: Neck supple.  Skin:    General: Skin is warm and dry.  Neurological:     General: No focal deficit present.     Mental Status: She is alert and oriented to person, place, and time. Mental status is at baseline.     Cranial Nerves: No cranial nerve deficit.      ED Course/ Medical Decision Making/ A&P    Procedures Procedures   Medications Ordered in ED Medications - No data to display Medical Decision Making:   Pam Vargas is a 42 y.o. female who presented to the ED today with dental pain detailed above.    Complete initial physical exam performed, notably the patient was HDS in no acute distress. One pinpoint obvious intraoral lesions  Poor dentition diffusely    Reviewed and confirmed nursing documentation for past medical history, family history, social history.    Initial Assessment:   With the patient's presentation of dental pain, most likely diagnosis is  reversible vs irreversible pulpitis. Other diagnoses were considered including (but not limited to) ludwig's angina, osteitis, dental abscess, PTA, RPA. These are considered less likely due to history of present illness and physical exam findings.   This is most consistent with an acute complicated illness With the pinpoint lesion, HSV is on the differential but less likely with only one spot.  Initial Plan:  Symptomatic management with NSAIDS/Tylenol Due to clinical overlap with potentially reversible pulpitis, antibiotics prescribed Patient will need definitive management with dentistry. Provided low cost/local dental resource chart provided by social work.  Disposition:  I have  considered need for hospitalization, however, considering all of the above, I believe this patient is stable for discharge at this time.  Patient/family educated about specific return precautions for given chief complaint and symptoms.  Patient/family educated about follow-up with PCP and dentistry.    Patient/family expressed understanding of return precautions and need for follow-up. Patient spoken to regarding all imaging and laboratory results and appropriate follow up for these results. All education provided in verbal form with additional information in written form. Time was allowed for answering of patient questions. Patient discharged.       Clinical Impression:  1. Pain, dental      Discharge   Final Clinical Impression(s) / ED Diagnoses Final diagnoses:  Pain, dental    Rx / DC Orders ED Discharge Orders          Ordered    penicillin v potassium (VEETID) 500 MG tablet  4 times daily        07/29/23 1408              Glyn Ade, MD 07/29/23 1410

## 2023-08-03 ENCOUNTER — Ambulatory Visit (INDEPENDENT_AMBULATORY_CARE_PROVIDER_SITE_OTHER): Payer: Medicaid Other | Admitting: Family Medicine

## 2023-08-03 ENCOUNTER — Other Ambulatory Visit (HOSPITAL_COMMUNITY)
Admission: RE | Admit: 2023-08-03 | Discharge: 2023-08-03 | Disposition: A | Discharge status: Home / Self Care | Payer: Medicaid Other | Source: Ambulatory Visit | Attending: Family Medicine | Admitting: Family Medicine

## 2023-08-03 VITALS — BP 108/70 | HR 67 | Wt 123.0 lb

## 2023-08-03 DIAGNOSIS — Z124 Encounter for screening for malignant neoplasm of cervix: Secondary | ICD-10-CM | POA: Insufficient documentation

## 2023-08-03 DIAGNOSIS — N889 Noninflammatory disorder of cervix uteri, unspecified: Secondary | ICD-10-CM

## 2023-08-03 NOTE — Patient Instructions (Signed)
It was great to see you! Thank you for allowing me to participate in your care!   I recommend that you always bring your medications to each appointment as this makes it easy to ensure we are on the correct medications and helps [place] not miss when refills are needed.  Our plans for today:  - We did not see any lesions on the cervix for biopsy. - We will communicate the results of your pap-smear once it comes back.  Take care and seek immediate care sooner if you develop any concerns. Please remember to show up 15 minutes before your scheduled appointment time!  Tiffany Kocher, DO [facility]

## 2023-08-04 NOTE — Progress Notes (Signed)
Patient given informed consent, signed copy in the chart.  Placed in lithotomy position. Cervix viewed with speculum and colposcope after application of acetic acid.   Colposcopy adequate (entire squamocolumnar junctions seen  in entirety) ?  yes Acetowhite lesions?none Punctation?no Mosaicism?  no Abnormal vasculature?  no Biopsies?none except for pap smear as screening was due ECC?no Complications? no  COMMENTS: Patient was given post procedure instructions.  I will notify her of  pathology results of her screening pap smear. Colposcopy clinically normal.

## 2023-08-07 ENCOUNTER — Encounter: Payer: Self-pay | Admitting: Student

## 2023-08-07 LAB — CYTOLOGY - PAP
Comment: NEGATIVE
Diagnosis: NEGATIVE
High risk HPV: NEGATIVE

## 2023-08-08 NOTE — Telephone Encounter (Signed)
Patient returns call to nurse line regarding results.   Patient requesting treatment for BV.   Forwarding to Dr. Jennette Kettle.   Please send treatment to [facility] on [address].   Veronda Prude, RN

## 2023-08-09 NOTE — Telephone Encounter (Signed)
Patient LVM again on nurse line resting BV treatment.

## 2023-08-10 ENCOUNTER — Other Ambulatory Visit: Payer: Self-pay | Admitting: Family Medicine

## 2023-08-10 MED ORDER — METRONIDAZOLE 500 MG PO TABS: 500.0000 mg | ORAL_TABLET | Freq: Two times a day (BID) | ORAL | 0 refills | Status: DC

## 2023-08-10 MED ORDER — METRONIDAZOLE 500 MG PO TABS: 500.0000 mg | ORAL_TABLET | Freq: Two times a day (BID) | ORAL | 0 refills | Status: AC

## 2023-08-20 ENCOUNTER — Other Ambulatory Visit: Payer: Self-pay

## 2023-08-20 ENCOUNTER — Encounter (HOSPITAL_COMMUNITY): Payer: Self-pay | Admitting: Emergency Medicine

## 2023-08-20 ENCOUNTER — Ambulatory Visit (HOSPITAL_COMMUNITY)
Admission: EM | Admit: 2023-08-20 | Discharge: 2023-08-20 | Disposition: A | Discharge status: Home / Self Care | Payer: Medicaid Other | Attending: Emergency Medicine | Admitting: Emergency Medicine

## 2023-08-20 DIAGNOSIS — N898 Other specified noninflammatory disorders of vagina: Secondary | ICD-10-CM | POA: Insufficient documentation

## 2023-08-20 DIAGNOSIS — Z113 Encounter for screening for infections with a predominantly sexual mode of transmission: Secondary | ICD-10-CM | POA: Insufficient documentation

## 2023-08-20 DIAGNOSIS — B379 Candidiasis, unspecified: Secondary | ICD-10-CM | POA: Insufficient documentation

## 2023-08-20 DIAGNOSIS — T3695XA Adverse effect of unspecified systemic antibiotic, initial encounter: Secondary | ICD-10-CM | POA: Diagnosis present

## 2023-08-20 MED ORDER — FLUCONAZOLE 150 MG PO TABS: 150.0000 mg | ORAL_TABLET | Freq: Once | ORAL | 0 refills | Status: DC | PRN

## 2023-08-20 NOTE — Discharge Instructions (Addendum)
I am treating you for yeast infection  We will call you if anything on your swab returns positive. You can also see these results on MyChart. Please abstain from sexual intercourse until your results return.

## 2023-08-20 NOTE — ED Triage Notes (Signed)
Symptoms for 4 days.  Complains of vaginal itching.  No burning with urination.  Denies discharge.    Patient has not used any medication

## 2023-08-20 NOTE — ED Provider Notes (Signed)
[facility]    CSN: [id number] Arrival date & time: 08/20/23  1404     History   Chief Complaint Chief Complaint  Patient presents with   Vaginal Itching    HPI Pam Vargas is a 42 y.o. female.  4 day history of vaginal itching Not having discharge or odor. No urinary symptoms No abdominal pain or cramping. No external rash  LMP 10/17  Recently finished course of flagyl for BV  Past Medical History:  Diagnosis Date   Advanced maternal age in multigravida    Alpha thalassemia silent carrier    Anemia    Blood transfusion without reported diagnosis    "I think so with my first delivery"   Cervical cerclage suture present 06/19/2020   06/19/20: ppx mcdonald cerclage with mersilene tape. Knot anteriorly.    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   SVT (supraventricular tachycardia) (HCC)    Alpha thalassemia silent carrier 06/19/2020   Single nabothian cyst 06/03/2020   Benign cyst of breast 06/05/2017   TOBACCO USER 11/02/2009   Persistent cough for 3 weeks or longer 10/03/2008    Past Surgical History:  Procedure Laterality Date   CERVICAL CERCLAGE N/A 06/19/2020   Procedure: CERCLAGE CERVICAL;  Surgeon: Casa Colorada Bing, MD;  Location: [facility];  Service: Gynecology;  Laterality: N/A;   CESAREAN SECTION     FOOT SURGERY     FOOT SURGERY      OB History     Gravida  7   Para  3   Term  1   Preterm  0   AB  3   Living  3      SAB  3   IAB  0   Ectopic  0   Multiple  0   Live Births  3            Home Medications    Prior to Admission medications    Medication Sig Start Date End Date Taking? Authorizing Provider  fluconazole (DIFLUCAN) 150 MG tablet Take 1 tablet (150 mg total) by mouth once as needed for up to 2 doses (take one pill on day 1, and the second pill 3 days later). 08/20/23  Yes Zarianna Dicarlo, Lurena Joiner, PA-C  hydrocortisone 2.5 % cream Apply topically 2 (two) times daily. 06/30/23   Erick Alley, DO  metoprolol tartrate (LOPRESSOR) 25 MG tablet Take 0.5 tablets (12.5 mg total) by mouth 2 (two) times daily. 06/06/23   Christell Constant, MD  Multiple Vitamin (MULTIVITAMIN PO) Take by mouth.    [provider]  norethindrone (MICRONOR) 0.35 MG tablet Take 1 tablet (0.35 mg total) by mouth daily. 06/30/23   Erick Alley, DO    Family History Family History  Problem Relation Age of Onset   Diabetes Mother    Hyperlipidemia Mother    Hypertension Mother    Heart disease Father    Hypertension Father    Breast cancer Maternal Aunt    Colon cancer Paternal Aunt     Social History Social History   Tobacco Use   Smoking status: Every Day  Current packs/day: 0.00    Types: Cigarettes    Last attempt to quit: 05/29/2020    Years since quitting: 3.2    Passive exposure: Current   Smokeless tobacco: Never   Tobacco comments:    6 cigarettes per day  Vaping Use   Vaping status: Never Used  Substance Use Topics   Alcohol use: Yes    Comment: occasionally drinks wine   Drug use: No     Allergies   Patient has no known allergies.   Review of Systems Review of Systems As per HPI  Physical Exam Triage Vital Signs ED Triage Vitals  Encounter Vitals Group     BP 08/20/23 1450 105/68     Systolic BP Percentile --      Diastolic BP Percentile --      Pulse --      Resp 08/20/23 1450 18     Temp 08/20/23 1450 98.5 F (36.9 C)     Temp Source 08/20/23 1450 Oral     SpO2 08/20/23 1450 98 %     Weight --      Height --      Head Circumference --      Peak Flow --      Pain Score 08/20/23 1447 0     Pain  Loc --      Pain Education --      Exclude from Growth Chart --    No data found.  Updated Vital Signs BP 105/68 (BP Location: Right Arm)   Temp 98.5 F (36.9 C) (Oral)   Resp 18   LMP 08/17/2023   SpO2 98%   Physical Exam Vitals and nursing note reviewed.  Constitutional:      General: She is not in acute distress.    Appearance: Normal appearance.  Cardiovascular:     Rate and Rhythm: Normal rate and regular rhythm.  Pulmonary:     Effort: Pulmonary effort is normal.  Abdominal:     Tenderness: There is no abdominal tenderness. There is no guarding.  Neurological:     Mental Status: She is alert and oriented to person, place, and time.     UC Treatments / Results  Labs (all labs ordered are listed, but only abnormal results are displayed) Labs Reviewed  CERVICOVAGINAL ANCILLARY ONLY    EKG  Radiology No results found.  Procedures Procedures   Medications Ordered in UC Medications - No data to display  Initial Impression / Assessment and Plan / UC Course  I have reviewed the triage vital signs and the nursing notes.  Pertinent labs & imaging results that were available during my care of the patient were reviewed by me and considered in my medical decision making (see chart for details).  Suspect antibiotic induced yeast infection Fluconazole 2 dose sent to pharmacy Patient would like STD testing today as well. Will also test for yeast.  Advised return or follow with ob/gyn if persisting  Final Clinical Impressions(s) / UC Diagnoses   Final diagnoses:  Vaginal itching  Antibiotic-induced yeast infection  Screen for STD (sexually transmitted disease)     Discharge Instructions      I am treating you for yeast infection  We will call you if anything on your swab returns positive. You can also see these results on MyChart. Please abstain from sexual intercourse until your results return.     ED Prescriptions     Medication Sig Dispense Auth.  Provider   fluconazole (DIFLUCAN) 150  MG tablet Take 1 tablet (150 mg total) by mouth once as needed for up to 2 doses (take one pill on day 1, and the second pill 3 days later). 2 tablet Jag Lenz, Lurena Joiner, PA-C      PDMP not reviewed this encounter.   Marlow Baars, [place] 08/20/23 1610

## 2023-08-21 LAB — CERVICOVAGINAL ANCILLARY ONLY
Candida Glabrata: NEGATIVE
Candida Vaginitis: POSITIVE — AB
Chlamydia: NEGATIVE
Comment: NEGATIVE
Comment: NEGATIVE
Comment: NEGATIVE
Comment: NEGATIVE
Comment: NORMAL
Neisseria Gonorrhea: NEGATIVE
Trichomonas: NEGATIVE

## 2023-08-28 ENCOUNTER — Telehealth: Payer: Self-pay

## 2023-08-28 NOTE — Telephone Encounter (Signed)
Patient calls nurse line reporting AUB.   She reports she started Norethindrone on 8/30 and reports compliance with taking daily.   She reports she started a light period on 10/17 and reports the bleeding has progressively worsened. She reports she has gone through ~6 pads today already. She reports she is on day 3 of her new pill pack.   She denies any abdominal cramping, dizziness or lightheadedness. No episodes of feeling faint.   Attempted to schedule patient an apt, however she can only come in in th early mornings.  Advised I will call her tomorrow to check on her bleeding and possibly schedule her for ATC on Wednesday morning.

## 2023-08-29 NOTE — Telephone Encounter (Signed)
Called patient to check on her.   She reports her bleeding has improved. She reports she has only had to change her pad once today.   She continues to deny dizziness or fatigue.   She reports she is on day 4 of her new pack.   She reports she will monitor her symptoms the rest of week. She will make a clinic apt for next week if her symptoms fail to improve.

## 2023-09-06 ENCOUNTER — Ambulatory Visit: Payer: Medicaid Other | Admitting: Internal Medicine

## 2023-09-06 NOTE — Progress Notes (Deleted)
Cardiology Office Note:  .    Date:  09/06/2023  ID:  VENNESA BASTEDO, DOB Mar 03, 1981, MRN [id number] PCP: Bess Kinds, MD  [facility] Providers Cardiologist:  None { Click to update primary MD,subspecialty MD or APP then REFRESH:1}    CC: Follow up SVT   History of Present Illness: .    Pam Vargas is a 42 y.o. female with a history of SVT noted at [facility] around the time of pregnancy. 2022: no symptoms. Doing well.    Relevant histories: .  Social *** ROS: As per HPI.   Studies Reviewed: .   Cardiac Studies & Procedures       ECHOCARDIOGRAM  ECHOCARDIOGRAM COMPLETE 10/14/2020  Narrative ECHOCARDIOGRAM REPORT    Patient Name:   Pam Vargas Date of Exam: 10/14/2020 Medical Rec #:  [id number]       Height:       64.0 in Accession #:    [id number]      Weight:       140.1 lb Date of Birth:  Oct 26, 1981       BSA:          1.682 m Patient Age:    39 years        BP:           104/63 mmHg Patient Gender: F               HR:           71 bpm. Exam Location:  [address]  Procedure: 2D Echo, Cardiac Doppler and Color Doppler  Indications:    R00.2 Palpitations  History:        Patient has no prior history of Echocardiogram examinations. Risk Factors:Current Smoker. Sinus tachycardia. Pregnant. Anemia.  Sonographer:    Cathie Beams RCS Referring Phys: [id number] [facility] A Karma Ansley  IMPRESSIONS   1. Inferior basal hypokinesis . Left ventricular ejection fraction, by estimation, is 55%. The left ventricle has normal function. The left ventricle demonstrates regional wall motion abnormalities (see scoring diagram/findings for description). Left ventricular diastolic parameters were normal. 2. Right ventricular systolic function is normal. The right ventricular size is normal. 3. The mitral valve is normal in structure. Mild mitral valve regurgitation. No evidence of mitral stenosis. 4. The aortic valve is tricuspid. Aortic valve  regurgitation is not visualized. No aortic stenosis is present. 5. The inferior vena cava is normal in size with greater than 50% respiratory variability, suggesting right atrial pressure of 3 mmHg.  FINDINGS Left Ventricle: Inferior basal hypokinesis. Left ventricular ejection fraction, by estimation, is 55%. The left ventricle has normal function. The left ventricle demonstrates regional wall motion abnormalities. The left ventricular internal cavity size was normal in size. There is no left ventricular hypertrophy. Left ventricular diastolic parameters were normal.  Right Ventricle: The right ventricular size is normal. No increase in right ventricular wall thickness. Right ventricular systolic function is normal.  Left Atrium: Left atrial size was normal in size.  Right Atrium: Right atrial size was normal in size.  Pericardium: There is no evidence of pericardial effusion.  Mitral Valve: The mitral valve is normal in structure. There is mild thickening of the mitral valve leaflet(s). Mild mitral valve regurgitation. No evidence of mitral valve stenosis.  Tricuspid Valve: The tricuspid valve is normal in structure. Tricuspid valve regurgitation is trivial. No evidence of tricuspid stenosis.  Aortic Valve: The aortic valve is tricuspid. Aortic valve regurgitation is not visualized. No aortic stenosis is  present.  Pulmonic Valve: The pulmonic valve was normal in structure. Pulmonic valve regurgitation is not visualized. No evidence of pulmonic stenosis.  Aorta: The aortic root is normal in size and structure.  Venous: The inferior vena cava is normal in size with greater than 50% respiratory variability, suggesting right atrial pressure of 3 mmHg.  IAS/Shunts: No atrial level shunt detected by color flow Doppler.   LEFT VENTRICLE PLAX 2D LVIDd:         5.00 cm  Diastology LVIDs:         3.90 cm  LV e' medial:    7.40 cm/s LV PW:         0.80 cm  LV E/e' medial:  9.3 LV IVS:         0.70 cm  LV e' lateral:   8.27 cm/s LVOT diam:     2.10 cm  LV E/e' lateral: 8.3 LV SV:         56 LV SV Index:   33 LVOT Area:     3.46 cm   RIGHT VENTRICLE RV Basal diam:  2.20 cm RV S prime:     11.40 cm/s TAPSE (M-mode): 2.2 cm  LEFT ATRIUM             Index       RIGHT ATRIUM           Index LA diam:        2.70 cm 1.61 cm/m  RA Area:     10.50 cm LA Vol (A2C):   32.7 ml 19.44 ml/m RA Volume:   22.90 ml  13.62 ml/m LA Vol (A4C):   24.5 ml 14.57 ml/m LA Biplane Vol: 29.3 ml 17.42 ml/m AORTIC VALVE LVOT Vmax:   71.30 cm/s LVOT Vmean:  49.400 cm/s LVOT VTI:    0.162 m  AORTA Ao Root diam: 3.20 cm  MITRAL VALVE MV Area (PHT): 2.56 cm    SHUNTS MV Decel Time: 296 msec    Systemic VTI:  0.16 m MV E velocity: 68.60 cm/s  Systemic Diam: 2.10 cm MV A velocity: 78.40 cm/s MV E/A ratio:  0.88  Charlton Haws MD Electronically signed by Charlton Haws MD Signature Date/Time: 10/14/2020/11:38:45 AM    Final             *** Risk Assessment/Calculations:    {Does this patient have ATRIAL FIBRILLATION?:[id number]}       Physical Exam:    VS:  LMP 08/17/2023    Wt Readings from Last 3 Encounters:  08/03/23 123 lb (55.8 kg)  07/29/23 120 lb (54.4 kg)  07/13/23 119 lb (54 kg)    Gen: *** distress, *** obese/well nourished/malnourished   Neck: No JVD, *** carotid bruit Ears: Homero Fellers Sign Cardiac: No Rubs or Gallops, *** Murmur, ***cardia, *** radial pulses Respiratory: Clear to auscultation bilaterally, *** effort, ***  respiratory rate GI: Soft, nontender, non-distended *** MS: No *** edema; *** moves all extremities Integument: Skin feels *** Neuro:  At time of evaluation, alert and oriented to person/place/time/situation *** Psych: Normal affect, patient feels ***   ASSESSMENT AND PLAN: .    ***   [name], MD FASE [facility] Cardiologist [facility]  [address] [place], [address] [place], [place] [address] [phone]  7:55  AM

## 2023-09-06 NOTE — Progress Notes (Unsigned)
Cardiology Office Note:  .    Date:  09/07/2023  ID:  Pam Vargas, DOB 02-Nov-1980, MRN [id number] PCP: Bess Kinds, MD  [facility] Providers Cardiologist:  Christell Constant, MD     CC: Follow up SVT  History of Present Illness: .    Pam Vargas is a 42 y.o. female with a history of SVT noted at [facility] around the time of pregnancy. 2022: no symptoms. Doing well.  Pam Vargas is a young female with a history of supraventricular tachycardia (SVT) first noted during pregnancy, was last seen in 2022. At that time, she reported no further palpitations and was managing well on metoprolol. Despite discussions about the potential for discontinuing the medication, the patient elected to continue due to the absence of symptoms. An echocardiogram performed revealed infrabasal hypokinesis and mild mitral regurgitation.  The patient, a former smoker, has been working on smoking cessation. Recently, she has experienced occasional palpitations, which she describes as a "funny heartbeat." These episodes seem to occur randomly, sometimes when she is relaxed, sometimes when she is overworked, and sometimes when she is nervous. She has found that holding her breath can sometimes alleviate these episodes. Despite these occasional palpitations, the patient reports feeling generally well.  Pam Vargas is now 42 years old and keeps the whole family busy.  Relevant histories: .  Social Social: [employer] are [place], [place]  and Pam Vargas  ROS: As per HPI.   Studies Reviewed: .   Cardiac Studies & Procedures       ECHOCARDIOGRAM  ECHOCARDIOGRAM COMPLETE 10/14/2020  Narrative ECHOCARDIOGRAM REPORT    Patient Name:   Pam Vargas Date of Exam: 10/14/2020 Medical Rec #:  [id number]       Height:       64.0 in Accession #:    [id number]      Weight:       140.1 lb Date of Birth:  08-Jun-1981       BSA:          1.682 m Patient Age:    39 years        BP:           104/63  mmHg Patient Gender: F               HR:           71 bpm. Exam Location:  [address]  Procedure: 2D Echo, Cardiac Doppler and Color Doppler  Indications:    R00.2 Palpitations  History:        Patient has no prior history of Echocardiogram examinations. Risk Factors:Current Smoker. Sinus tachycardia. Pregnant. Anemia.  Sonographer:    Cathie Beams RCS Referring Phys: [id number] [facility] A Missael Ferrari  IMPRESSIONS   1. Inferior basal hypokinesis . Left ventricular ejection fraction, by estimation, is 55%. The left ventricle has normal function. The left ventricle demonstrates regional wall motion abnormalities (see scoring diagram/findings for description). Left ventricular diastolic parameters were normal. 2. Right ventricular systolic function is normal. The right ventricular size is normal. 3. The mitral valve is normal in structure. Mild mitral valve regurgitation. No evidence of mitral stenosis. 4. The aortic valve is tricuspid. Aortic valve regurgitation is not visualized. No aortic stenosis is present. 5. The inferior vena cava is normal in size with greater than 50% respiratory variability, suggesting right atrial pressure of 3 mmHg.  FINDINGS Left Ventricle: Inferior basal hypokinesis. Left ventricular ejection fraction, by estimation, is 55%. The left ventricle has normal function.  The left ventricle demonstrates regional wall motion abnormalities. The left ventricular internal cavity size was normal in size. There is no left ventricular hypertrophy. Left ventricular diastolic parameters were normal.  Right Ventricle: The right ventricular size is normal. No increase in right ventricular wall thickness. Right ventricular systolic function is normal.  Left Atrium: Left atrial size was normal in size.  Right Atrium: Right atrial size was normal in size.  Pericardium: There is no evidence of pericardial effusion.  Mitral Valve: The mitral valve is normal in structure.  There is mild thickening of the mitral valve leaflet(s). Mild mitral valve regurgitation. No evidence of mitral valve stenosis.  Tricuspid Valve: The tricuspid valve is normal in structure. Tricuspid valve regurgitation is trivial. No evidence of tricuspid stenosis.  Aortic Valve: The aortic valve is tricuspid. Aortic valve regurgitation is not visualized. No aortic stenosis is present.  Pulmonic Valve: The pulmonic valve was normal in structure. Pulmonic valve regurgitation is not visualized. No evidence of pulmonic stenosis.  Aorta: The aortic root is normal in size and structure.  Venous: The inferior vena cava is normal in size with greater than 50% respiratory variability, suggesting right atrial pressure of 3 mmHg.  IAS/Shunts: No atrial level shunt detected by color flow Doppler.   LEFT VENTRICLE PLAX 2D LVIDd:         5.00 cm  Diastology LVIDs:         3.90 cm  LV e' medial:    7.40 cm/s LV PW:         0.80 cm  LV E/e' medial:  9.3 LV IVS:        0.70 cm  LV e' lateral:   8.27 cm/s LVOT diam:     2.10 cm  LV E/e' lateral: 8.3 LV SV:         56 LV SV Index:   33 LVOT Area:     3.46 cm   RIGHT VENTRICLE RV Basal diam:  2.20 cm RV S prime:     11.40 cm/s TAPSE (M-mode): 2.2 cm  LEFT ATRIUM             Index       RIGHT ATRIUM           Index LA diam:        2.70 cm 1.61 cm/m  RA Area:     10.50 cm LA Vol (A2C):   32.7 ml 19.44 ml/m RA Volume:   22.90 ml  13.62 ml/m LA Vol (A4C):   24.5 ml 14.57 ml/m LA Biplane Vol: 29.3 ml 17.42 ml/m AORTIC VALVE LVOT Vmax:   71.30 cm/s LVOT Vmean:  49.400 cm/s LVOT VTI:    0.162 m  AORTA Ao Root diam: 3.20 cm  MITRAL VALVE MV Area (PHT): 2.56 cm    SHUNTS MV Decel Time: 296 msec    Systemic VTI:  0.16 m MV E velocity: 68.60 cm/s  Systemic Diam: 2.10 cm MV A velocity: 78.40 cm/s MV E/A ratio:  0.88  Charlton Haws MD Electronically signed by Charlton Haws MD Signature Date/Time: 10/14/2020/11:38:45 AM    Final              Physical Exam:    VS:  BP 98/62 (BP Location: Left Arm, Patient Position: Sitting, Cuff Size: Normal)   Pulse 81   Resp 16   Ht 5\' 4"  (1.626 m)   Wt 125 lb 12.8 oz (57.1 kg)   LMP 08/17/2023   SpO2 98%  BMI 21.59 kg/m    Wt Readings from Last 3 Encounters:  09/07/23 125 lb 12.8 oz (57.1 kg)  08/03/23 123 lb (55.8 kg)  07/29/23 120 lb (54.4 kg)    Gen: no distress, smells of smoke   Neck: No JVD Ears: no Pam Vargas Sign Cardiac: No Rubs or Gallops, no murmur, RRR +2radial pulses Respiratory: Clear to auscultation bilaterally, normal effort, normal  respiratory rate GI: Soft, nontender, non-distended  MS: No edema;  moves all extremities Integument: Skin feels warm Neuro:  At time of evaluation, alert and oriented to person/place/time/situation  Psych: Normal affect, patient feels well   ASSESSMENT AND PLAN: .    Paroxysmal Supraventricular Tachycardia Tobacco abuse - Infrequent episodes of palpitations. Patient has found holding her breath to be effective in terminating episodes. Currently asymptomatic on Metoprolol. -Continue Metoprolol. -Provide additional Metoprolol 25mg  PO Q6H PRN for palpitations. -Encourage continued smoking cessation. -Follow-up in 1 year or sooner if palpitations become more frequent. - we discussed natural ways to abort SVT  First Degree Heart Block Likely secondary to Metoprolol use. No associated symptoms. -Continue current Metoprolol regimen.  Mild Mitral Regurgitation and Infrabasal Hypokinesis - Identified on echocardiogram performed on 10/14/2020. Currently asymptomatic. - Order follow-up echocardiogram to reassess mitral valve regurgitation and infrabasal hypokinesis.  One year follow up   [name], MD FASE [facility] Cardiologist [facility]  [address] [place], [address] [place], [place] [address] [phone]  8:38 AM

## 2023-09-07 ENCOUNTER — Encounter: Payer: Self-pay | Admitting: Internal Medicine

## 2023-09-07 ENCOUNTER — Ambulatory Visit: Payer: Medicaid Other | Attending: Internal Medicine | Admitting: Internal Medicine

## 2023-09-07 VITALS — BP 98/62 | HR 81 | Resp 16 | Ht 64.0 in | Wt 125.8 lb

## 2023-09-07 DIAGNOSIS — I440 Atrioventricular block, first degree: Secondary | ICD-10-CM | POA: Diagnosis not present

## 2023-09-07 DIAGNOSIS — Z720 Tobacco use: Secondary | ICD-10-CM | POA: Insufficient documentation

## 2023-09-07 DIAGNOSIS — I340 Nonrheumatic mitral (valve) insufficiency: Secondary | ICD-10-CM | POA: Insufficient documentation

## 2023-09-07 MED ORDER — METOPROLOL TARTRATE 25 MG PO TABS: 25.0000 mg | ORAL_TABLET | Freq: Four times a day (QID) | ORAL | 11 refills | Status: DC | PRN

## 2023-09-07 NOTE — Patient Instructions (Signed)
Medication Instructions:  Your physician has recommended you make the following change in your medication:  START: metoprolol tartrate (Lopressor) 25 mg by mouth every 6 hours as needed for funny heart beats.  *If you need a refill on your cardiac medications before your next appointment, please call your pharmacy*   Lab Work: NONE If you have labs (blood work) drawn today and your tests are completely normal, you will receive your results only by: MyChart Message (if you have MyChart) OR A paper copy in the mail If you have any lab test that is abnormal or we need to change your treatment, we will call you to review the results.   Testing/Procedures: Your physician has requested that you have an echocardiogram. Echocardiography is a painless test that uses sound waves to create images of your heart. It provides your doctor with information about the size and shape of your heart and how well your heart's chambers and valves are working. This procedure takes approximately one hour. There are no restrictions for this procedure. Please do NOT wear cologne, perfume, aftershave, or lotions (deodorant is allowed). Please arrive 15 minutes prior to your appointment time.  Please note: We ask at that you not bring children with you during ultrasound (echo/ vascular) testing. Due to room size and safety concerns, children are not allowed in the ultrasound rooms during exams. Our front office staff cannot provide observation of children in our lobby area while testing is being conducted. An adult accompanying a patient to their appointment will only be allowed in the ultrasound room at the discretion of the ultrasound technician under special circumstances. We apologize for any inconvenience.    Follow-Up: At [facility], you and your health needs are our priority.  As part of our continuing mission to provide you with exceptional heart care, we have created designated Provider Care Teams.   These Care Teams include your primary Cardiologist (physician) and Advanced Practice Providers (APPs -  Physician Assistants and Nurse Practitioners) who all work together to provide you with the care you need, when you need it.    Your next appointment:   1 year(s)  Provider:   Christell Constant, MD

## 2023-09-11 ENCOUNTER — Ambulatory Visit: Payer: Medicaid Other | Admitting: Internal Medicine

## 2023-10-06 ENCOUNTER — Other Ambulatory Visit: Payer: Self-pay | Admitting: Internal Medicine

## 2023-10-10 ENCOUNTER — Other Ambulatory Visit: Payer: Self-pay | Admitting: Student

## 2023-10-10 DIAGNOSIS — B009 Herpesviral infection, unspecified: Secondary | ICD-10-CM

## 2023-10-11 ENCOUNTER — Ambulatory Visit (HOSPITAL_COMMUNITY): Payer: Medicaid Other | Attending: Internal Medicine

## 2023-10-11 DIAGNOSIS — I340 Nonrheumatic mitral (valve) insufficiency: Secondary | ICD-10-CM | POA: Diagnosis present

## 2023-10-11 LAB — ECHOCARDIOGRAM COMPLETE
Area-P 1/2: 3.53 cm2
Est EF: 55
MV M vel: 5.43 m/s
MV Peak grad: 117.9 mm[Hg]
S' Lateral: 3 cm

## 2023-11-02 ENCOUNTER — Other Ambulatory Visit: Payer: Self-pay | Admitting: Student

## 2023-11-02 DIAGNOSIS — B009 Herpesviral infection, unspecified: Secondary | ICD-10-CM

## 2023-11-10 ENCOUNTER — Other Ambulatory Visit: Payer: Self-pay

## 2023-11-10 ENCOUNTER — Encounter (HOSPITAL_COMMUNITY): Payer: Self-pay | Admitting: Emergency Medicine

## 2023-11-10 ENCOUNTER — Emergency Department (HOSPITAL_COMMUNITY)
Admission: EM | Admit: 2023-11-10 | Discharge: 2023-11-10 | Disposition: A | Discharge status: Home / Self Care | Payer: Medicaid Other | Attending: Emergency Medicine | Admitting: Emergency Medicine

## 2023-11-10 DIAGNOSIS — B9689 Other specified bacterial agents as the cause of diseases classified elsewhere: Secondary | ICD-10-CM | POA: Diagnosis not present

## 2023-11-10 DIAGNOSIS — K120 Recurrent oral aphthae: Secondary | ICD-10-CM | POA: Diagnosis not present

## 2023-11-10 DIAGNOSIS — N760 Acute vaginitis: Secondary | ICD-10-CM | POA: Diagnosis not present

## 2023-11-10 DIAGNOSIS — L292 Pruritus vulvae: Secondary | ICD-10-CM | POA: Diagnosis present

## 2023-11-10 LAB — URINALYSIS, ROUTINE W REFLEX MICROSCOPIC
Bacteria, UA: NONE SEEN
Bilirubin Urine: NEGATIVE
Glucose, UA: NEGATIVE mg/dL
Hgb urine dipstick: NEGATIVE
Ketones, ur: NEGATIVE mg/dL
Leukocytes,Ua: NEGATIVE
Nitrite: NEGATIVE
Protein, ur: 30 mg/dL — AB
Specific Gravity, Urine: >1.046 — ABNORMAL HIGH (ref 1.005–1.030)
pH: 5 (ref 5.0–8.0)

## 2023-11-10 LAB — WET PREP, GENITAL
Sperm: NONE SEEN
Trich, Wet Prep: NONE SEEN
WBC, Wet Prep HPF POC: <10 (ref <10)
Yeast Wet Prep HPF POC: NONE SEEN

## 2023-11-10 LAB — HIV ANTIBODY (ROUTINE TESTING W REFLEX): HIV Screen 4th Generation wRfx: NONREACTIVE

## 2023-11-10 LAB — HCG, SERUM, QUALITATIVE: Preg, Serum: NEGATIVE

## 2023-11-10 MED ORDER — METRONIDAZOLE 500 MG PO TABS: 500.0000 mg | ORAL_TABLET | Freq: Two times a day (BID) | ORAL | 0 refills | Status: DC

## 2023-11-10 MED ORDER — METRONIDAZOLE 500 MG PO TABS
500.0000 mg | ORAL_TABLET | Freq: Once | ORAL | Status: AC
  Administered 2023-11-10: 500 mg via ORAL
  Filled 2023-11-10: qty 1

## 2023-11-10 NOTE — Discharge Instructions (Addendum)
 It was a pleasure caring for you today.  Workup today was positive for bacterial vaginosis.  I have sent a prescription for antibiotics to your pharmacy.  Please take this for the next 7 days.  Follow-up with your primary care provider.  Seek emergency care if experiencing any new or worsening symptoms.  Please also follow up with dentistry for your gum complaint. I have provided a list of dentists in the area in this discharge paperwork.  The rest of your STD testing will result in the next 3 days.  Please follow-up on MyChart.  Seek care at the department of public health if any of these tests are positive.

## 2023-11-10 NOTE — ED Triage Notes (Signed)
 Patient presents due to a "bump on her gum" and vaginal itch with a smell. She noticed the "bump" this morning. She reports her vagina has been itching for 3 days. Denies discharge and sexual activity for a couple of weeks.

## 2023-11-10 NOTE — ED Provider Notes (Signed)
 [place] EMERGENCY DEPARTMENT AT [facility] Provider Note   CSN: [id number] Arrival date & time: 11/10/23  1435     History  Chief Complaint  Patient presents with   Dental Problem   Vaginal Itching    Pam Vargas is a 43 y.o. female with PMHx anemia who presents to ED with multiple complaints.  Patient complaining of a bump on her gum. Patient stating that she found it today and it hurts. Denies fever.  Patient also concerned for vaginal itching and odor x3 days. Last sexual intercourse 6 weeks ago. Denies abdominal pain, dysuria, hematuria, nausea, vomiting, diarrhea, hematochezia. Patient stating that she has been skipping her birth control placebo pills so she has not had a menstrual cycle since 07/2023.    Vaginal Itching       Home Medications Prior to Admission medications   Medication Sig Start Date End Date Taking? Authorizing Provider  hydrocortisone  2.5 % cream Apply topically 2 (two) times daily. 06/30/23   Joshua Domino, DO  metoprolol  tartrate (LOPRESSOR ) 25 MG tablet Take 0.5 tablets (12.5 mg total) by mouth 2 (two) times daily. 06/06/23   Santo Stanly LABOR, MD  metoprolol  tartrate (LOPRESSOR ) 25 MG tablet Take 1 tablet (25 mg total) by mouth every 6 (six) hours as needed. 09/07/23   Chandrasekhar, Stanly LABOR, MD  metoprolol  tartrate (LOPRESSOR ) 25 MG tablet TAKE 0.5 TABLETS BY MOUTH 2 TIMES DAILY. 10/06/23   Santo Stanly LABOR, MD  Multiple Vitamin (MULTIVITAMIN PO) Take by mouth.    [provider]  norethindrone  (MICRONOR ) 0.35 MG tablet Take 1 tablet (0.35 mg total) by mouth daily. 06/30/23   Joshua Domino, DO  valACYclovir  (VALTREX ) 1000 MG tablet TAKE 1 TABLET BY MOUTH DAILY FOR 5 DAYS. KEEP ON HAND AND START TAKING AT FIRST SIGNS OF A FLARE. 11/02/23   Jennelle Riis, MD      Allergies    Patient has no known allergies.    Review of Systems   Review of Systems  Genitourinary:  Positive for vaginal discharge.     Physical Exam Updated Vital Signs BP 104/60   Pulse 91   Temp 98.8 F (37.1 C) (Oral)   Resp 18   SpO2 99%  Physical Exam Vitals and nursing note reviewed.  Constitutional:      General: She is not in acute distress. HENT:     Head: Normocephalic and atraumatic.     Mouth/Throat:     Mouth: Mucous membranes are moist.     Pharynx: No oropharyngeal exudate or posterior oropharyngeal erythema.     Comments: A very tiny spot on her upper left roof of her mouth. No oral swelling or other lesions. No bleeding. Multiple missing teeth and multiple cavities. No trismus. No dysphonia. Eyes:     General: No scleral icterus.       Right eye: No discharge.        Left eye: No discharge.     Conjunctiva/sclera: Conjunctivae normal.  Cardiovascular:     Rate and Rhythm: Normal rate and regular rhythm.     Pulses: Normal pulses.     Heart sounds: Normal heart sounds. No murmur heard. Pulmonary:     Effort: Pulmonary effort is normal. No respiratory distress.     Breath sounds: No wheezing, rhonchi or rales.  Abdominal:     General: Abdomen is flat. Bowel sounds are normal. There is no distension.     Palpations: Abdomen is soft. There is no mass.  Tenderness: There is no abdominal tenderness.  Musculoskeletal:     Right lower leg: No edema.     Left lower leg: No edema.  Skin:    General: Skin is warm and dry.     Findings: No rash.  Neurological:     General: No focal deficit present.     Mental Status: She is alert and oriented to person, place, and time. Mental status is at baseline.  Psychiatric:        Mood and Affect: Mood normal.        Behavior: Behavior normal.     ED Results / Procedures / Treatments   Labs (all labs ordered are listed, but only abnormal results are displayed) Labs Reviewed  WET PREP, GENITAL - Abnormal; Notable for the following components:      Result Value   Clue Cells Wet Prep HPF POC PRESENT (*)    All other components within normal  limits  URINALYSIS, ROUTINE W REFLEX MICROSCOPIC - Abnormal; Notable for the following components:   Specific Gravity, Urine >1.046 (*)    Protein, ur 30 (*)    All other components within normal limits  HCG, SERUM, QUALITATIVE  RPR  HIV ANTIBODY (ROUTINE TESTING W REFLEX)  GC/CHLAMYDIA PROBE AMP ([place]) NOT AT [facility]    EKG None  Radiology No results found.  Procedures Procedures    Medications Ordered in ED Medications  metroNIDAZOLE  (FLAGYL ) tablet 500 mg (500 mg Oral Given 11/10/23 1715)    ED Course/ Medical Decision Making/ A&P                                 Medical Decision Making Amount and/or Complexity of Data Reviewed Labs: ordered.    This patient presents to the ED for concern of dental pain and vaginal itching, this involves an extensive number of treatment options, and is a complaint that carries with it a high risk of complications and morbidity.  The differential diagnosis includes deep space infection   Co morbidities that complicate the patient evaluation  none   Additional history obtained:  Dr. Jennelle PCP   Problem List / ED Course / Critical interventions / Medication management  Patient presents to ED with multiple complaints.  Patient concerned for a very small bump in the upper left roof of her mouth that appeared today and has been painful.  Physical exam showing a very small white dot in the left upper roof of mouth - concerning for a very small aphthous ulcer vs small area of trauma.  Rest of physical exam reassuring.  Patient also concern for vaginal itching and odor.  Denies abdominal pain, dysuria, hematuria or fever.  Denies any other infectious complaint today. I Ordered, and personally interpreted labs.  Wet prep positive for clue cells.  UA without concern for infection.  hCG negative.  Educated patient that her GC/committee a, RPR, and HIV test will take longer to result and she will need to follow-up with the Department of  Public Health.  Patient verbalized understanding of plan. Provided patient with a dose of Flagyl  in ED which she tolerated well.  Sent the rest of her antibiotic course to her pharmacy for her bacterial vaginosis. As for patient's dental complaint - Provided patient with list for dentist in the area. This area does not look infected today. Nurse provided patient with some oral soothing gel.  I have reviewed the patients home  medicines and have made adjustments as needed Patient afebrile with stable vitals.  Provided with return precautions.  Discharged in good condition.  Social Determinants of Health:  None          Final Clinical Impression(s) / ED Diagnoses Final diagnoses:  BV (bacterial vaginosis)  Ulcer aphthous oral    Rx / DC Orders ED Discharge Orders     None         Hoy Nidia FALCON, [place] 11/10/23 1740    Geraldene Hamilton, MD 11/11/23 [phone]

## 2023-11-11 LAB — RPR: RPR Ser Ql: NONREACTIVE

## 2023-11-13 LAB — GC/CHLAMYDIA PROBE AMP (~~LOC~~) NOT AT ARMC
Chlamydia: NEGATIVE
Comment: NEGATIVE
Comment: NORMAL
Neisseria Gonorrhea: NEGATIVE

## 2024-01-09 ENCOUNTER — Encounter (HOSPITAL_COMMUNITY): Payer: Self-pay

## 2024-01-09 ENCOUNTER — Emergency Department (HOSPITAL_COMMUNITY)
Admission: EM | Admit: 2024-01-09 | Discharge: 2024-01-09 | Disposition: A | Discharge status: Home / Self Care | Attending: Emergency Medicine | Admitting: Emergency Medicine

## 2024-01-09 ENCOUNTER — Other Ambulatory Visit: Payer: Self-pay

## 2024-01-09 DIAGNOSIS — Z79899 Other long term (current) drug therapy: Secondary | ICD-10-CM | POA: Insufficient documentation

## 2024-01-09 DIAGNOSIS — M546 Pain in thoracic spine: Secondary | ICD-10-CM | POA: Diagnosis not present

## 2024-01-09 DIAGNOSIS — M545 Low back pain, unspecified: Secondary | ICD-10-CM | POA: Diagnosis present

## 2024-01-09 DIAGNOSIS — M5489 Other dorsalgia: Secondary | ICD-10-CM

## 2024-01-09 MED ORDER — METHOCARBAMOL 500 MG PO TABS: 500.0000 mg | ORAL_TABLET | Freq: Two times a day (BID) | ORAL | 0 refills | Status: DC

## 2024-01-09 MED ORDER — IBUPROFEN 800 MG PO TABS: 800.0000 mg | ORAL_TABLET | Freq: Three times a day (TID) | ORAL | 0 refills | Status: DC

## 2024-01-09 NOTE — Discharge Instructions (Signed)
 Please refer to the attached instructions

## 2024-01-09 NOTE — ED Provider Notes (Signed)
 [place] EMERGENCY DEPARTMENT AT [facility] Provider Note   CSN: [id number] Arrival date & time: 01/09/24  1052     History  Chief Complaint  Patient presents with   Back Pain    Pam Vargas is a 43 y.o. female.  Patient presents with lower thoracic/upper lumbar discomfort, sudden onset last night. Increased pain today when placing her 40 pound toddler in his car seat. No urinary symptoms  The history is provided by the patient.  Back Pain Location:  Thoracic spine and lumbar spine Quality:  Stiffness Stiffness is present:  All day Radiates to:  Does not radiate Pain severity:  Moderate Pain is:  Same all the time Onset quality:  Sudden Duration:  1 day Timing:  Constant Progression:  Waxing and waning Chronicity:  New Context: lifting heavy objects   Ineffective treatments:  Being still      Home Medications Prior to Admission medications   Medication Sig Start Date End Date Taking? Authorizing Provider  hydrocortisone 2.5 % cream Apply topically 2 (two) times daily. 06/30/23   Erick Alley, DO  metoprolol tartrate (LOPRESSOR) 25 MG tablet Take 0.5 tablets (12.5 mg total) by mouth 2 (two) times daily. 06/06/23   [name] A, MD  metoprolol tartrate (LOPRESSOR) 25 MG tablet Take 1 tablet (25 mg total) by mouth every 6 (six) hours as needed. 09/07/23   Chandrasekhar, Mahesh A, MD  metoprolol tartrate (LOPRESSOR) 25 MG tablet TAKE 0.5 TABLETS BY MOUTH 2 TIMES DAILY. 10/06/23   Chandrasekhar, Rondel Jumbo, MD  metroNIDAZOLE (FLAGYL) 500 MG tablet Take 1 tablet (500 mg total) by mouth 2 (two) times daily. 11/10/23   Dorthy Cooler, PA-C  Multiple Vitamin (MULTIVITAMIN PO) Take by mouth.    [provider]  norethindrone (MICRONOR) 0.35 MG tablet Take 1 tablet (0.35 mg total) by mouth daily. 06/30/23   Erick Alley, DO  valACYclovir (VALTREX) 1000 MG tablet TAKE 1 TABLET BY MOUTH DAILY FOR 5 DAYS. KEEP ON HAND AND START TAKING AT FIRST  SIGNS OF A FLARE. 11/02/23   Bess Kinds, MD      Allergies    Patient has no known allergies.    Review of Systems   Review of Systems  Musculoskeletal:  Positive for back pain.  All other systems reviewed and are negative.   Physical Exam Updated Vital Signs BP (!) 110/51 (BP Location: Left Arm)   Pulse 72   Temp 98.1 F (36.7 C) (Oral)   Resp 16   Ht 5\' 4"  (1.626 m)   Wt 54.4 kg   SpO2 100%   BMI 20.59 kg/m  Physical Exam Vitals and nursing note reviewed.  Constitutional:      Appearance: Normal appearance.  HENT:     Head: Normocephalic.     Nose: Nose normal.  Eyes:     Conjunctiva/sclera: Conjunctivae normal.  Cardiovascular:     Rate and Rhythm: Normal rate.  Pulmonary:     Effort: Pulmonary effort is normal.  Abdominal:     Palpations: Abdomen is soft.  Musculoskeletal:        General: Normal range of motion.     Cervical back: Normal range of motion.     Comments: Mild discomfort to right lateral aspect of lower thoracic spine/upper lumbar spine with movement.   Skin:    General: Skin is warm and dry.  Neurological:     General: No focal deficit present.     Mental Status: She is alert and  oriented to person, place, and time.     ED Results / Procedures / Treatments   Labs (all labs ordered are listed, but only abnormal results are displayed) Labs Reviewed - No data to display  EKG None  Radiology No results found.  Procedures Procedures    Medications Ordered in ED Medications - No data to display  ED Course/ Medical Decision Making/ A&P                                 Medical Decision Making  Patient with back pain.  No neurological deficits and normal neuro exam.  Patient is ambulatory.  No loss of bowel or bladder control.  No concern for cauda equina.  No fever, night sweats, weight loss, h/o cancer, IVDA, no recent procedure to back. No urinary symptoms suggestive of UTI.  Supportive care and return precaution discussed.  Appears safe for discharge at this time. Follow up as indicated in discharge paperwork.          Final Clinical Impression(s) / ED Diagnoses Final diagnoses:  Other acute back pain    Rx / DC Orders ED Discharge Orders          Ordered    ibuprofen (ADVIL) 800 MG tablet  3 times daily        01/09/24 1741    methocarbamol (ROBAXIN) 500 MG tablet  2 times daily        01/09/24 1741              Felicie Morn, NP 01/09/24 1743    Linwood Dibbles, MD 01/10/24 1115

## 2024-01-09 NOTE — ED Triage Notes (Signed)
 Patient is here for evaluation of lower back pain. Pt reports that her back started hurting last night. Pt states she did pick her child up last night and that could be contributing to the pain. Denies any other injuries or any pain with urination. States it feels like a muscle pain.

## 2024-01-12 ENCOUNTER — Other Ambulatory Visit: Payer: Self-pay | Admitting: Family Medicine

## 2024-01-12 DIAGNOSIS — Z0000 Encounter for general adult medical examination without abnormal findings: Secondary | ICD-10-CM

## 2024-01-19 ENCOUNTER — Encounter

## 2024-01-21 ENCOUNTER — Ambulatory Visit (HOSPITAL_COMMUNITY)
Admission: EM | Admit: 2024-01-21 | Discharge: 2024-01-21 | Disposition: A | Discharge status: Home / Self Care | Attending: Emergency Medicine | Admitting: Emergency Medicine

## 2024-01-21 ENCOUNTER — Encounter (HOSPITAL_COMMUNITY): Payer: Self-pay

## 2024-01-21 DIAGNOSIS — Z3202 Encounter for pregnancy test, result negative: Secondary | ICD-10-CM | POA: Diagnosis not present

## 2024-01-21 DIAGNOSIS — N760 Acute vaginitis: Secondary | ICD-10-CM | POA: Diagnosis present

## 2024-01-21 LAB — POCT URINALYSIS DIP (MANUAL ENTRY)
Bilirubin, UA: NEGATIVE
Blood, UA: NEGATIVE
Glucose, UA: NEGATIVE mg/dL
Leukocytes, UA: NEGATIVE
Nitrite, UA: NEGATIVE
Protein Ur, POC: 100 mg/dL — AB
Spec Grav, UA: 1.025 (ref 1.010–1.025)
Urobilinogen, UA: 0.2 U/dL
pH, UA: 5.5 (ref 5.0–8.0)

## 2024-01-21 LAB — POCT URINE PREGNANCY: Preg Test, Ur: NEGATIVE

## 2024-01-21 NOTE — Discharge Instructions (Addendum)
 The results of your vaginal swab test which screens for BV, yeast, gonorrhea, chlamydia and trichomonas will be posted to your MyChart account once it is complete.  This typically takes 2 to 4 days.  Please abstain from sexual intercourse of any kind, vaginal, oral or anal, until you have received the results of your STD testing.      If any of your results are abnormal, you will receive a phone call regarding treatment.  Prescriptions, if any are needed, will be provided for you at your pharmacy.      Your urine pregnancy test today is negative.     Our point-of-care analysis of your urine sample today was normal and did not reveal any concern for urinary tract infection.     If you have not had complete resolution of your symptoms after completing any recommended treatment or if your symptoms worsen, please return for repeat evaluation.     Thank you for visiting [facility] today.  We appreciate the opportunity to participate in your care.

## 2024-01-21 NOTE — ED Triage Notes (Signed)
 Patient reports that she has had vaginal itching, clear discharge, and a foul odor x 3 days.

## 2024-01-21 NOTE — ED Provider Notes (Signed)
 [facility]    CSN: [id number] Arrival date & time: 01/21/24  1658    HISTORY  No chief complaint on file.  HPI Pam Vargas is a pleasant, 43 y.o. female who presents to urgent care today. Patient endorses abnormal vaginal discharge, abnormal vaginal odor, vaginal itching, and vaginal irritation, hx BV, reports 1-2 episodes annually.   Patient denies dyspareunia, rash, suprapubic pain, burning during urination, increased frequency of urination, and concern for STD exposure.      Past Medical History:  Diagnosis Date   Advanced maternal age in multigravida    Alpha thalassemia silent carrier    Anemia    Blood transfusion without reported diagnosis    "I think so with my first delivery"   Cervical cerclage suture present 06/19/2020   06/19/20: ppx mcdonald cerclage with mersilene tape. Knot anteriorly.    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   SVT (supraventricular tachycardia) (HCC)    Alpha thalassemia silent carrier 06/19/2020   Single nabothian cyst 06/03/2020   Benign cyst of breast 06/05/2017   TOBACCO USER 11/02/2009   Persistent cough for 3 weeks or longer 10/03/2008   Past Surgical History:  Procedure Laterality Date   CERVICAL CERCLAGE N/A 06/19/2020   Procedure: CERCLAGE CERVICAL;  Surgeon: [place] Bing, MD;  Location: [facility];  Service: Gynecology;  Laterality: N/A;   CESAREAN SECTION     FOOT SURGERY     FOOT SURGERY     OB History     Gravida  7   Para  3    Term  1   Preterm  0   AB  3   Living  3      SAB  3   IAB  0   Ectopic  0   Multiple  0   Live Births  3          Home Medications    Prior to Admission medications   Medication Sig Start Date End Date Taking? Authorizing Provider  hydrocortisone 2.5 % cream Apply topically 2 (two) times daily. 06/30/23   Erick Alley, DO  ibuprofen (ADVIL) 800 MG tablet Take 1 tablet (800 mg total) by mouth 3 (three) times daily. 01/09/24   Felicie Morn, NP  methocarbamol (ROBAXIN) 500 MG tablet Take 1 tablet (500 mg total) by mouth 2 (two) times daily. 01/09/24   Felicie Morn, NP  metoprolol tartrate (LOPRESSOR) 25 MG tablet Take 0.5 tablets (12.5 mg total) by mouth 2 (two) times daily. 06/06/23   [name] A, MD  metoprolol tartrate (LOPRESSOR) 25 MG tablet Take 1 tablet (25 mg total) by mouth every 6 (six) hours as needed. 09/07/23   Chandrasekhar, Mahesh A, MD  metoprolol tartrate (LOPRESSOR) 25 MG tablet TAKE 0.5 TABLETS BY MOUTH 2 TIMES DAILY. 10/06/23   Chandrasekhar, Rondel Jumbo, MD  metroNIDAZOLE (FLAGYL) 500 MG tablet Take 1 tablet (500 mg total) by mouth 2 (two) times daily. 11/10/23   Dorthy Cooler, PA-C  Multiple Vitamin (MULTIVITAMIN PO) Take by  mouth.    [provider]  norethindrone (MICRONOR) 0.35 MG tablet Take 1 tablet (0.35 mg total) by mouth daily. 06/30/23   Erick Alley, DO  valACYclovir (VALTREX) 1000 MG tablet TAKE 1 TABLET BY MOUTH DAILY FOR 5 DAYS. KEEP ON HAND AND START TAKING AT FIRST SIGNS OF A FLARE. 11/02/23   Bess Kinds, MD    Family History Family History  Problem Relation Age of Onset   Diabetes Mother    Hyperlipidemia Mother    Hypertension Mother    Heart disease Father    Hypertension Father    Breast cancer Maternal Aunt    Colon cancer Paternal Aunt    Social History Social History   Tobacco Use   Smoking status: Every Day    Current packs/day: 0.00    Types: Cigarettes    Last attempt to quit: 05/29/2020     Years since quitting: 3.6    Passive exposure: Current   Smokeless tobacco: Never   Tobacco comments:    6 cigarettes per day  Vaping Use   Vaping status: Never Used  Substance Use Topics   Alcohol use: Yes    Comment: occasionally drinks wine   Drug use: No   Allergies   Patient has no known allergies.  Review of Systems Review of Systems Pertinent findings revealed after performing a 14 point review of systems has been noted in the history of present illness.  Physical Exam Vital Signs BP 115/68 (BP Location: Right Arm)   Pulse 77   Temp 98.2 F (36.8 C) (Oral)   Resp 14   LMP 12/25/2023   SpO2 97%   No data found.  Physical Exam Vitals and nursing note reviewed.  Constitutional:      General: She is not in acute distress.    Appearance: Normal appearance. She is not ill-appearing.  HENT:     Head: Normocephalic and atraumatic.  Eyes:     General: Lids are normal.        Right eye: No discharge.        Left eye: No discharge.     Extraocular Movements: Extraocular movements intact.     Conjunctiva/sclera: Conjunctivae normal.     Right eye: Right conjunctiva is not injected.     Left eye: Left conjunctiva is not injected.  Neck:     Trachea: Trachea and phonation normal.  Cardiovascular:     Rate and Rhythm: Normal rate and regular rhythm.     Pulses: Normal pulses.     Heart sounds: Normal heart sounds. No murmur heard.    No friction rub. No gallop.  Pulmonary:     Effort: Pulmonary effort is normal. No accessory muscle usage, prolonged expiration or respiratory distress.     Breath sounds: Normal breath sounds. No stridor, decreased air movement or transmitted upper airway sounds. No decreased breath sounds, wheezing, rhonchi or rales.  Chest:     Chest wall: No tenderness.  Genitourinary:    Comments: Patient politely declines pelvic exam today, patient provided a vaginal swab for testing. Musculoskeletal:        General: Normal range of motion.      Cervical back: Normal range of motion and neck supple. Normal range of motion.  Lymphadenopathy:     Cervical: No cervical adenopathy.  Skin:    General: Skin is warm and dry.     Findings: No erythema or rash.  Neurological:     General: No focal deficit present.  Mental Status: She is alert and oriented to person, place, and time.  Psychiatric:        Mood and Affect: Mood normal.        Behavior: Behavior normal.     Visual Acuity Right Eye Distance:   Left Eye Distance:   Bilateral Distance:    Right Eye Near:   Left Eye Near:    Bilateral Near:     UC Couse / Diagnostics / Procedures:     Radiology No results found.  Procedures Procedures (including critical care time) EKG  Pending results:  Labs Reviewed  POCT URINALYSIS DIP (MANUAL ENTRY) - Abnormal; Notable for the following components:      Result Value   Color, UA orange (*)    Ketones, POC UA small (15) (*)    Protein Ur, POC =100 (*)    All other components within normal limits  POCT URINE PREGNANCY  CERVICOVAGINAL ANCILLARY ONLY    Medications Ordered in UC: Medications - No data to display  UC Diagnoses / Final Clinical Impressions(s)   I have reviewed the triage vital signs and the nursing notes.  Pertinent labs & imaging results that were available during my care of the patient were reviewed by me and considered in my medical decision making (see chart for details).    Final diagnoses:  Vulvovaginitis   STD screening was performed, patient advised that the results be posted to their MyChart and if any of the results are positive, they will be notified by phone, further treatment will be provided as indicated based on results of STD screening. If positive for BV, pt requesting metronidazole PO for ttmt.  Patient was advised to abstain from sexual intercourse until that they receive the results of their STD testing.  Patient was also advised to use condoms to protect themselves from STD  exposure. Urinalysis today was normal. Urine pregnancy test was negative. Return precautions advised.  Drug allergies reviewed, all questions addressed.   Please see discharge instructions below for details of plan of care as provided to patient. ED Prescriptions   None    PDMP not reviewed this encounter.  Disposition Upon Discharge:  Condition: stable for discharge home  Patient presented with concern for an acute illness with associated systemic symptoms and significant discomfort requiring urgent management. In my opinion, this is a condition that a prudent lay person (someone who possesses an average knowledge of health and medicine) may potentially expect to result in complications if not addressed urgently such as respiratory distress, impairment of bodily function or dysfunction of bodily organs.   As such, the patient has been evaluated and assessed, work-up was performed and treatment was provided in alignment with urgent care protocols and evidence based medicine.  Patient/parent/caregiver has been advised that the patient may require follow up for further testing and/or treatment if the symptoms continue in spite of treatment, as clinically indicated and appropriate.  Routine symptom specific, illness specific and/or disease specific instructions were discussed with the patient and/or caregiver at length.  Prevention strategies for avoiding STD exposure were also discussed.  The patient will follow up with their current PCP if and as advised. If the patient does not currently have a PCP we will assist them in obtaining one.   The patient may need specialty follow up if the symptoms continue, in spite of conservative treatment and management, for further workup, evaluation, consultation and treatment as clinically indicated and appropriate.  Patient/parent/caregiver verbalized understanding and agreement of plan  as discussed.  All questions were addressed during visit.  Please see  discharge instructions below for further details of plan.    Discharge Instructions      The results of your vaginal swab test which screens for BV, yeast, gonorrhea, chlamydia and trichomonas will be posted to your MyChart account once it is complete.  This typically takes 2 to 4 days.  Please abstain from sexual intercourse of any kind, vaginal, oral or anal, until you have received the results of your STD testing.   If any of your results are abnormal, you will receive a phone call regarding treatment.  Prescriptions, if any are needed, will be provided for you at your pharmacy.      Your urine pregnancy test today is negative.     Our point-of-care analysis of your urine sample today was normal and did not reveal any concern for urinary tract infection.     If you have not had complete resolution of your symptoms after completing any recommended treatment or if your symptoms worsen, please return for repeat evaluation.     Thank you for visiting [facility] today.  We appreciate the opportunity to participate in your care.       This office note has been dictated using Teaching laboratory technician.  Unfortunately, this method of dictation can sometimes lead to typographical or grammatical errors.  I apologize for your inconvenience in advance if this occurs.  Please do not hesitate to reach out to me if clarification is needed.       Theadora Rama Scales, PA-C 01/22/24 1414

## 2024-01-22 ENCOUNTER — Telehealth (HOSPITAL_COMMUNITY): Payer: Self-pay

## 2024-01-22 LAB — CERVICOVAGINAL ANCILLARY ONLY
Bacterial Vaginitis (gardnerella): POSITIVE — AB
Candida Glabrata: NEGATIVE
Candida Vaginitis: POSITIVE — AB
Chlamydia: NEGATIVE
Comment: NEGATIVE
Comment: NEGATIVE
Comment: NEGATIVE
Comment: NEGATIVE
Comment: NEGATIVE
Comment: NORMAL
Neisseria Gonorrhea: NEGATIVE
Trichomonas: NEGATIVE

## 2024-01-22 MED ORDER — FLUCONAZOLE 150 MG PO TABS: 150.0000 mg | ORAL_TABLET | Freq: Once | ORAL | 0 refills | Status: AC

## 2024-01-22 MED ORDER — METRONIDAZOLE 500 MG PO TABS: 500.0000 mg | ORAL_TABLET | Freq: Two times a day (BID) | ORAL | 0 refills | Status: AC

## 2024-01-22 NOTE — Telephone Encounter (Signed)
 Per protocol, pt requires tx with metronidazole and Diflucan.  Rx sent to pharmacy on file.

## 2024-01-24 ENCOUNTER — Ambulatory Visit
Admission: RE | Admit: 2024-01-24 | Discharge: 2024-01-24 | Disposition: A | Discharge status: Home / Self Care | Source: Ambulatory Visit | Attending: Family Medicine

## 2024-01-24 DIAGNOSIS — Z0000 Encounter for general adult medical examination without abnormal findings: Secondary | ICD-10-CM

## 2024-01-31 ENCOUNTER — Other Ambulatory Visit: Payer: Self-pay | Admitting: Student

## 2024-02-08 ENCOUNTER — Encounter: Payer: Self-pay | Admitting: Student

## 2024-02-14 ENCOUNTER — Emergency Department (HOSPITAL_COMMUNITY)
Admission: EM | Admit: 2024-02-14 | Discharge: 2024-02-14 | Disposition: A | Discharge status: Home / Self Care | Attending: Emergency Medicine | Admitting: Emergency Medicine

## 2024-02-14 ENCOUNTER — Emergency Department (HOSPITAL_COMMUNITY)

## 2024-02-14 ENCOUNTER — Other Ambulatory Visit: Payer: Self-pay

## 2024-02-14 DIAGNOSIS — E876 Hypokalemia: Secondary | ICD-10-CM | POA: Insufficient documentation

## 2024-02-14 DIAGNOSIS — I471 Supraventricular tachycardia, unspecified: Secondary | ICD-10-CM | POA: Diagnosis not present

## 2024-02-14 DIAGNOSIS — R051 Acute cough: Secondary | ICD-10-CM | POA: Diagnosis not present

## 2024-02-14 DIAGNOSIS — R059 Cough, unspecified: Secondary | ICD-10-CM | POA: Diagnosis present

## 2024-02-14 DIAGNOSIS — R519 Headache, unspecified: Secondary | ICD-10-CM | POA: Insufficient documentation

## 2024-02-14 LAB — CBC
HCT: 40.1 % (ref 36.0–46.0)
Hemoglobin: 13.2 g/dL (ref 12.0–15.0)
MCH: 29 pg (ref 26.0–34.0)
MCHC: 32.9 g/dL (ref 30.0–36.0)
MCV: 88.1 fL (ref 80.0–100.0)
Platelets: 203 10*3/uL (ref 150–400)
RBC: 4.55 MIL/uL (ref 3.87–5.11)
RDW: 12.7 % (ref 11.5–15.5)
WBC: 9.1 10*3/uL (ref 4.0–10.5)
nRBC: 0 % (ref 0.0–0.2)

## 2024-02-14 LAB — BASIC METABOLIC PANEL WITH GFR
Anion gap: 8 (ref 5–15)
BUN: <5 mg/dL — ABNORMAL LOW (ref 6–20)
CO2: 24 mmol/L (ref 22–32)
Calcium: 9.3 mg/dL (ref 8.9–10.3)
Chloride: 107 mmol/L (ref 98–111)
Creatinine, Ser: 0.73 mg/dL (ref 0.44–1.00)
GFR, Estimated: >60 mL/min (ref >60)
Glucose, Bld: 97 mg/dL (ref 70–99)
Potassium: 2.7 mmol/L — CL (ref 3.5–5.1)
Sodium: 139 mmol/L (ref 135–145)

## 2024-02-14 LAB — RESP PANEL BY RT-PCR (RSV, FLU A&B, COVID)  RVPGX2
Influenza A by PCR: NEGATIVE
Influenza B by PCR: NEGATIVE
Resp Syncytial Virus by PCR: NEGATIVE
SARS Coronavirus 2 by RT PCR: NEGATIVE

## 2024-02-14 LAB — HCG, SERUM, QUALITATIVE: Preg, Serum: NEGATIVE

## 2024-02-14 LAB — D-DIMER, QUANTITATIVE: D-Dimer, Quant: 3.22 ug{FEU}/mL — ABNORMAL HIGH (ref 0.00–0.50)

## 2024-02-14 LAB — TROPONIN I (HIGH SENSITIVITY): Troponin I (High Sensitivity): 8 ng/L (ref <18)

## 2024-02-14 LAB — MAGNESIUM: Magnesium: 2.2 mg/dL (ref 1.7–2.4)

## 2024-02-14 MED ORDER — POTASSIUM CHLORIDE CRYS ER 20 MEQ PO TBCR
40.0000 meq | EXTENDED_RELEASE_TABLET | Freq: Once | ORAL | Status: AC
  Administered 2024-02-14: 40 meq via ORAL
  Filled 2024-02-14: qty 2

## 2024-02-14 MED ORDER — SODIUM CHLORIDE (PF) 0.9 % IJ SOLN
INTRAMUSCULAR | Status: AC
  Filled 2024-02-14: qty 50

## 2024-02-14 MED ORDER — METOCLOPRAMIDE HCL 5 MG/ML IJ SOLN
10.0000 mg | Freq: Once | INTRAMUSCULAR | Status: AC
  Administered 2024-02-14: 10 mg via INTRAVENOUS
  Filled 2024-02-14: qty 2

## 2024-02-14 MED ORDER — ADENOSINE 6 MG/2ML IV SOLN
6.0000 mg | Freq: Once | INTRAVENOUS | Status: AC
  Administered 2024-02-14: 6 mg via INTRAVENOUS
  Filled 2024-02-14: qty 2

## 2024-02-14 MED ORDER — POTASSIUM CHLORIDE ER 10 MEQ PO TBCR: 10.0000 meq | EXTENDED_RELEASE_TABLET | Freq: Every day | ORAL | 0 refills | Status: DC

## 2024-02-14 MED ORDER — BENZONATATE 100 MG PO CAPS: 100.0000 mg | ORAL_CAPSULE | Freq: Three times a day (TID) | ORAL | 0 refills | Status: DC | PRN

## 2024-02-14 MED ORDER — ACETAMINOPHEN 325 MG PO TABS
650.0000 mg | ORAL_TABLET | Freq: Once | ORAL | Status: AC
  Administered 2024-02-14: 650 mg via ORAL
  Filled 2024-02-14: qty 2

## 2024-02-14 MED ORDER — IOHEXOL 350 MG/ML SOLN
75.0000 mL | Freq: Once | INTRAVENOUS | Status: AC | PRN
  Administered 2024-02-14: 75 mL via INTRAVENOUS

## 2024-02-14 NOTE — ED Triage Notes (Signed)
 Pt reports productive green cough and headache since Sunday. Pt reports feeling palpitations since midnight. Has prescribed metoprolol. Denies CP/SOB.

## 2024-02-14 NOTE — ED Notes (Signed)
 Pt connected to [facility], while remaining on cardiac monitoring as well. Dr. Monique Ano at bedsside

## 2024-02-14 NOTE — ED Provider Notes (Signed)
 [place] EMERGENCY DEPARTMENT AT [facility] Provider Note   CSN: [id number] Arrival date & time: 02/14/24  0143     History  Chief Complaint  Patient presents with   Cough   Headache   Palpitations    Pam Vargas is a 43 y.o. female.  The history is provided by the patient and medical records.  Cough Associated symptoms: headaches   Headache Associated symptoms: cough   Palpitations Associated symptoms: cough   Pam Vargas is a 43 y.o. female who presents to the Emergency Department complaining of headache and cough.  She presents to the emergency department for evaluation of gradual onset global headache that started on Saturday.  On Sunday she developed a cough that is productive of green phlegm.  She does have some mild throat irritation.  No fever but she does get hot and cold at times.  No shortness of breath.  She did wake around midnight feeling like her heart is beating fast.  There is no associated chest pain.  No abdominal pain, nausea, vomiting, diarrhea.  She does take a half of metoprolol twice daily for history of SVT.  She has not missed any doses.      Home Medications Prior to Admission medications   Medication Sig Start Date End Date Taking? Authorizing Provider  benzonatate (TESSALON) 100 MG capsule Take 1 capsule (100 mg total) by mouth 3 (three) times daily as needed for cough. 02/14/24  Yes Kelsey Patricia, MD  potassium chloride (KLOR-CON) 10 MEQ tablet Take 1 tablet (10 mEq total) by mouth daily. 02/14/24  Yes Kelsey Patricia, MD  ibuprofen (ADVIL) 800 MG tablet Take 1 tablet (800 mg total) by mouth 3 (three) times daily. 01/09/24   Gigi Kyle, NP  methocarbamol (ROBAXIN) 500 MG tablet Take 1 tablet (500 mg total) by mouth 2 (two) times daily. 01/09/24   Gigi Kyle, NP  metoprolol tartrate (LOPRESSOR) 25 MG tablet Take 0.5 tablets (12.5 mg total) by mouth 2 (two) times daily. 06/06/23   Gloriann Larger A, MD  metoprolol  tartrate (LOPRESSOR) 25 MG tablet Take 1 tablet (25 mg total) by mouth every 6 (six) hours as needed. 09/07/23   Chandrasekhar, Mahesh A, MD  metoprolol tartrate (LOPRESSOR) 25 MG tablet TAKE 0.5 TABLETS BY MOUTH 2 TIMES DAILY. 10/06/23   Jann Melody, MD  Multiple Vitamin (MULTIVITAMIN PO) Take by mouth.    [provider]  norethindrone (MICRONOR) 0.35 MG tablet Take 1 tablet (0.35 mg total) by mouth daily. 06/30/23   Glenn Lange, DO  valACYclovir (VALTREX) 1000 MG tablet TAKE 1 TABLET BY MOUTH DAILY FOR 5 DAYS. KEEP ON HAND AND START TAKING AT FIRST SIGNS OF A FLARE. 11/02/23   Wilhemena Harbour, MD      Allergies    Patient has no known allergies.    Review of Systems   Review of Systems  Respiratory:  Positive for cough.   Cardiovascular:  Positive for palpitations.  Neurological:  Positive for headaches.  All other systems reviewed and are negative.   Physical Exam Updated Vital Signs BP (!) 127/98 (BP Location: Right Arm)   Pulse 88   Temp 98.7 F (37.1 C) (Oral)   Resp 17   Ht 5\' 4"  (1.626 m)   Wt 54.4 kg   LMP 12/25/2023 Comment: negative HCG serum test 02-14-2024  SpO2 100%   BMI 20.60 kg/m  Physical Exam Vitals and nursing note reviewed.  Constitutional:      Appearance: She  is well-developed.  HENT:     Head: Normocephalic and atraumatic.  Cardiovascular:     Rate and Rhythm: Regular rhythm. Tachycardia present.     Heart sounds: No murmur heard. Pulmonary:     Effort: Pulmonary effort is normal. No respiratory distress.     Breath sounds: Normal breath sounds.  Abdominal:     Palpations: Abdomen is soft.     Tenderness: There is no abdominal tenderness. There is no guarding or rebound.  Musculoskeletal:        General: No tenderness.     Cervical back: Neck supple.  Skin:    General: Skin is warm and dry.  Neurological:     Mental Status: She is alert and oriented to person, place, and time.     Comments: 5 out of 5 strength in all 4  extremities with sensation to light touch intact in all 4 extremities  Psychiatric:        Behavior: Behavior normal.     ED Results / Procedures / Treatments   Labs (all labs ordered are listed, but only abnormal results are displayed) Labs Reviewed  BASIC METABOLIC PANEL WITH GFR - Abnormal; Notable for the following components:      Result Value   Potassium 2.7 (*)    BUN <5 (*)    All other components within normal limits  D-DIMER, QUANTITATIVE - Abnormal; Notable for the following components:   D-Dimer, Quant 3.22 (*)    All other components within normal limits  RESP PANEL BY RT-PCR (RSV, FLU A&B, COVID)  RVPGX2  CBC  HCG, SERUM, QUALITATIVE  MAGNESIUM  TROPONIN I (HIGH SENSITIVITY)    EKG EKG Interpretation Date/Time:  Wednesday February 14 2024 03:19:43 EDT Ventricular Rate:  105 PR Interval:  188 QRS Duration:  74 QT Interval:  334 QTC Calculation: 442 R Axis:   79  Text Interpretation: Sinus tachycardia Biatrial enlargement Confirmed by Tilden Fossa [phone]) on 02/14/2024 3:39:39 AM  Radiology CT Angio Chest PE W/Cm &/Or Wo Cm Result Date: 02/14/2024 CLINICAL DATA:  Pulmonary embolism suspected, intermediate probability for pulmonary embolism EXAM: CT ANGIOGRAPHY CHEST WITH CONTRAST TECHNIQUE: Multidetector CT imaging of the chest was performed using the standard protocol during bolus administration of intravenous contrast. Multiplanar CT image reconstructions and MIPs were obtained to evaluate the vascular anatomy. RADIATION DOSE REDUCTION: This exam was performed according to the departmental dose-optimization program which includes automated exposure control, adjustment of the mA and/or kV according to patient size and/or use of iterative reconstruction technique. CONTRAST:  75mL OMNIPAQUE IOHEXOL 350 MG/ML SOLN COMPARISON:  08/18/2022 FINDINGS: Cardiovascular: Satisfactory opacification of the pulmonary arteries to the segmental level. No evidence of pulmonary  embolism. Normal heart size. No pericardial effusion. Mediastinum/Nodes: Negative for mass or adenopathy Lungs/Pleura: Lungs are clear. No pleural effusion or pneumothorax. Upper Abdomen: No acute abnormality. Musculoskeletal: No chest wall abnormality. No acute or significant osseous findings. Review of the MIP images confirms the above findings. IMPRESSION: Negative chest CTA. Electronically Signed   By: Tiburcio Pea M.D.   On: 02/14/2024 06:17   DG Chest Portable 1 View Result Date: 02/14/2024 CLINICAL DATA:  Cough and shortness of breath EXAM: PORTABLE CHEST 1 VIEW COMPARISON:  08/18/2022 FINDINGS: The heart size and mediastinal contours are within normal limits. Both lungs are clear. The visualized skeletal structures are unremarkable. IMPRESSION: No active disease. Electronically Signed   By: Alcide Clever M.D.   On: 02/14/2024 02:37    Procedures Procedures  {Document cardiac monitor,  telemetry assessment procedure when appropriate Medications Ordered in ED Medications  adenosine (ADENOCARD) 6 MG/2ML injection 6 mg (6 mg Intravenous Given 02/14/24 0319)  potassium chloride SA (KLOR-CON M) CR tablet 40 mEq (40 mEq Oral Given 02/14/24 0413)  metoCLOPramide (REGLAN) injection 10 mg (10 mg Intravenous Given 02/14/24 0413)  acetaminophen (TYLENOL) tablet 650 mg (650 mg Oral Given 02/14/24 0552)  iohexol (OMNIPAQUE) 350 MG/ML injection 75 mL (75 mLs Intravenous Contrast Given 02/14/24 0607)    ED Course/ Medical Decision Making/ A&P                                 Medical Decision Making Amount and/or Complexity of Data Reviewed Labs: ordered. Radiology: ordered.  Risk OTC drugs. Prescription drug management.   Patient here for evaluation of headache, cough and palpitations.  She is in SVT at time of ED arrival, nontoxic-appearing.  She received adenosine x 1 at 6 mg with conversion to sinus tachycardia.  Labs significant for hypokalemia, will replace orally.  Headache is improved after  treatment in the emergency department.  In terms of her headache presentation is not consistent with meningitis, subarachnoid hemorrhage, dural sinus thrombosis.  After conversion from SVT to sinus tach she did remain mildly tachycardic.  She does have respiratory symptoms.  Chest x-ray is negative for acute abnormality.  Lungs are clear on examination.  D-dimer was obtained, which was elevated.  Plan to obtain CTA to rule out PE.  She is negative for COVID and flu.  CTA is neg for acute abnormality.  On repeat evaluation pt is improved, HA resolved.  Discussed likely viral URI with cough as well as episode of SVT, potentially driven by hypokalemia. Plan to d/c with tessalon for cough as well as potassium replacement.  Feel pt is stable for discharge home with outpatient follow up and return precautions.        Final Clinical Impression(s) / ED Diagnoses Final diagnoses:  SVT (supraventricular tachycardia) (HCC)  Hypokalemia  Bad headache  Acute cough    Rx / DC Orders ED Discharge Orders          Ordered    benzonatate (TESSALON) 100 MG capsule  3 times daily PRN        02/14/24 0635    potassium chloride (KLOR-CON) 10 MEQ tablet  Daily        02/14/24 5366              Kelsey Patricia, MD 02/14/24 [phone]

## 2024-02-19 ENCOUNTER — Other Ambulatory Visit: Payer: Self-pay | Admitting: Student

## 2024-03-04 ENCOUNTER — Other Ambulatory Visit: Payer: Self-pay | Admitting: Student

## 2024-03-04 DIAGNOSIS — Z309 Encounter for contraceptive management, unspecified: Secondary | ICD-10-CM

## 2024-04-09 ENCOUNTER — Encounter: Payer: Self-pay | Admitting: *Deleted

## 2024-05-29 ENCOUNTER — Encounter (HOSPITAL_COMMUNITY): Payer: Self-pay

## 2024-05-29 ENCOUNTER — Ambulatory Visit (HOSPITAL_COMMUNITY)
Admission: EM | Admit: 2024-05-29 | Discharge: 2024-05-29 | Disposition: A | Discharge status: Home / Self Care | Attending: Family Medicine | Admitting: Family Medicine

## 2024-05-29 DIAGNOSIS — N898 Other specified noninflammatory disorders of vagina: Secondary | ICD-10-CM | POA: Insufficient documentation

## 2024-05-29 DIAGNOSIS — Z113 Encounter for screening for infections with a predominantly sexual mode of transmission: Secondary | ICD-10-CM | POA: Diagnosis not present

## 2024-05-29 LAB — HIV ANTIBODY (ROUTINE TESTING W REFLEX): HIV Screen 4th Generation wRfx: NONREACTIVE

## 2024-05-29 MED ORDER — FLUCONAZOLE 150 MG PO TABS
ORAL_TABLET | ORAL | 0 refills | Status: DC
Start: 2024-05-29 — End: 2024-07-07

## 2024-05-29 NOTE — ED Triage Notes (Signed)
 Patient presents to the office for vaginal odor and irritation X 3 days. Patient would like STI testing.

## 2024-05-29 NOTE — ED Provider Notes (Signed)
 [facility]   [id number] 05/29/24 Arrival Time: 1715  ASSESSMENT & PLAN:  1. Vaginal discharge   2. Vaginal itching   3. Screening for STDs (sexually transmitted diseases)    Meds ordered this encounter  Medications   fluconazole  (DIFLUCAN ) 150 MG tablet    Sig: Take one tablet by mouth as a single dose. May repeat in 3 days if symptoms persist.    Dispense:  2 tablet    Refill:  0      Discharge Instructions      We have sent testing for sexually transmitted infections. We will notify you of any positive results once they are received. If required, we will prescribe any medications you might need.  Please refrain from all sexual activity for at least the next seven days.     Without s/s of PID.  Labs Reviewed  HIV ANTIBODY (ROUTINE TESTING W REFLEX)  RPR  CERVICOVAGINAL ANCILLARY ONLY    Pending: Unresulted Labs (From admission, onward)     Start     Ordered   05/29/24 1820  HIV Antibody (routine testing w rflx)  (HIV Antibody (Routine testing w reflex) panel)  Once,   URGENT        05/29/24 1819   05/29/24 1820  RPR  Once,   URGENT        05/29/24 1819             Will notify of any positive results. Instructed to refrain from sexual activity for at least seven days.  Reviewed expectations re: course of current medical issues. Questions answered. Outlined signs and symptoms indicating need for more acute intervention. Patient verbalized understanding. After Visit Summary given.   SUBJECTIVE:  Pam Vargas is a 43 y.o. female who presents with complaint of vaginal irritation/itching. Desires STD testing. Sweats a lot at work. Questions vaginal yeast infection. No tx PTA. Otherwise well.  Patient's last menstrual period was 05/29/2024 (approximate).   OBJECTIVE:  Vitals:   05/29/24 1748  BP: 124/84  Pulse: 90  Resp: 18  Temp: 97.7 F (36.5 C)  TempSrc: Oral  SpO2: 98%     General appearance: alert, cooperative, appears  stated age and no distress Lungs: unlabored respirations; speaks full sentences without difficulty Back: no CVA tenderness; FROM at waist Abdomen: soft, non-tender GU: deferred Skin: warm and dry Psychological: alert and cooperative; normal mood and affect.    Labs Reviewed  HIV ANTIBODY (ROUTINE TESTING W REFLEX)  RPR  CERVICOVAGINAL ANCILLARY ONLY    No Known Allergies  Past Medical History:  Diagnosis Date   Advanced maternal age in multigravida    Alpha thalassemia silent carrier    Anemia    Blood transfusion without reported diagnosis    I think so with my first delivery   Cervical cerclage suture present 06/19/2020   06/19/20: ppx mcdonald cerclage with mersilene tape. Knot anteriorly.    Cervical mass 04/2020   Preterm labor    PROM (premature rupture of membranes) 08/28/2020   Sinus tachycardia    Family History  Problem Relation Age of Onset   Diabetes Mother    Hyperlipidemia Mother    Hypertension Mother    Heart disease Father    Hypertension Father    Breast cancer Maternal Aunt    Colon cancer Paternal Aunt    Social History   Socioeconomic History   Marital status: Married    Spouse name: Not on file   Number of children: Not on file  Years of education: Not on file   Highest education level: Not on file  Occupational History   Not on file  Tobacco Use   Smoking status: Every Day    Current packs/day: 0.00    Types: Cigarettes    Last attempt to quit: 05/29/2020    Years since quitting: 4.0    Passive exposure: Current   Smokeless tobacco: Never   Tobacco comments:    6 cigarettes per day  Vaping Use   Vaping status: Never Used  Substance and Sexual Activity   Alcohol use: Yes    Comment: occasionally drinks wine   Drug use: No   Sexual activity: Yes    Partners: Male    Birth control/protection: None    Comment: menarche 43yo, sexual debut 43yo  Other Topics Concern   Not on file  Social History Narrative   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, applications    Social Drivers of Corporate investment banker Strain: Not on file  Food Insecurity: No Food Insecurity (12/27/2021)   Hunger Vital Sign    Worried About Running Out of Food in the Last Year: Never true    Ran Out of Food in the Last Year: Never true  Transportation Needs: No Transportation Needs (12/27/2021)   PRAPARE - Administrator, Civil Service (Medical): No    Lack of Transportation (Non-Medical): No  Physical Activity: Not on file  Stress: Not on file  Social Connections: Not on file  Intimate Partner Violence: Not At Risk (09/06/2020)   Received from [facility] visits prior to 12/31/2022.   Humiliation, Afraid, Rape, and Kick questionnaire    Fear of Current or Ex-Partner: No    Emotionally Abused: No    Physically Abused: No    Sexually Abused: No           Rolinda Rogue, MD 05/29/24 [phone]

## 2024-05-29 NOTE — Discharge Instructions (Signed)
 We have sent testing for sexually transmitted infections. We will notify you of any positive results once they are received. If required, we will prescribe any medications you might need.  Please refrain from all sexual activity for at least the next seven days.

## 2024-05-30 ENCOUNTER — Ambulatory Visit (HOSPITAL_COMMUNITY): Payer: Self-pay

## 2024-05-30 LAB — CERVICOVAGINAL ANCILLARY ONLY
Bacterial Vaginitis (gardnerella): POSITIVE — AB
Candida Glabrata: NEGATIVE
Candida Vaginitis: NEGATIVE
Chlamydia: NEGATIVE
Comment: NEGATIVE
Comment: NEGATIVE
Comment: NEGATIVE
Comment: NEGATIVE
Comment: NEGATIVE
Comment: NORMAL
Neisseria Gonorrhea: NEGATIVE
Trichomonas: NEGATIVE

## 2024-05-30 LAB — RPR: RPR Ser Ql: NONREACTIVE

## 2024-05-30 MED ORDER — METRONIDAZOLE 500 MG PO TABS: 500.0000 mg | ORAL_TABLET | Freq: Two times a day (BID) | ORAL | 0 refills | Status: AC

## 2024-06-26 ENCOUNTER — Encounter (HOSPITAL_COMMUNITY): Payer: Self-pay

## 2024-06-26 ENCOUNTER — Ambulatory Visit (HOSPITAL_COMMUNITY)
Admission: EM | Admit: 2024-06-26 | Discharge: 2024-06-26 | Disposition: A | Discharge status: Home / Self Care | Attending: Family Medicine | Admitting: Family Medicine

## 2024-06-26 DIAGNOSIS — Z113 Encounter for screening for infections with a predominantly sexual mode of transmission: Secondary | ICD-10-CM | POA: Diagnosis present

## 2024-06-26 DIAGNOSIS — N898 Other specified noninflammatory disorders of vagina: Secondary | ICD-10-CM | POA: Diagnosis present

## 2024-06-26 MED ORDER — METRONIDAZOLE 500 MG PO TABS: 500.0000 mg | ORAL_TABLET | Freq: Two times a day (BID) | ORAL | 0 refills | Status: DC

## 2024-06-26 NOTE — ED Triage Notes (Signed)
 Pt states vaginal itching for the past 2 days.  States she would like to be tested for STD's.  Pt also states she is having burning to the back of her neck.

## 2024-06-26 NOTE — Medical Student Note (Signed)
 [facility] Insurance account manager Note For educational purposes for Medical, PA and NP students only and not part of the legal medical record.   CSN: [id number] Arrival date & time: 06/26/24  1744      History   Chief Complaint Chief Complaint  Patient presents with   Vaginal Itching    HPI Pam Vargas is a 43 y.o. female.  Pt presents with a 2 day history of vaginal itching with some mild pain. She denies any vaginal discharge or urinary symptoms. She does report that the symptoms are similar to an episode she had a month ago when she was diagnosed with BV. At that time she was given a course of Flagyl  which she completed and symptoms resolve. Pt has been practicing vaginal douching with tap water and Dove soap. Pt is sexually active with her husband only. She does request full STI testing.    Vaginal Itching Pertinent negatives include no abdominal pain.    Past Medical History:  Diagnosis Date   Advanced maternal age in multigravida    Alpha thalassemia silent carrier    Anemia    Blood transfusion without reported diagnosis    I think so with my first delivery   Cervical cerclage suture present 06/19/2020   06/19/20: ppx mcdonald cerclage with mersilene tape. Knot anteriorly.    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   SVT (supraventricular tachycardia) (HCC)    Alpha thalassemia silent carrier 06/19/2020   Single nabothian cyst 06/03/2020   Benign cyst of breast 06/05/2017    TOBACCO USER 11/02/2009   Persistent cough for 3 weeks or longer 10/03/2008    Past Surgical History:  Procedure Laterality Date   CERVICAL CERCLAGE N/A 06/19/2020   Procedure: CERCLAGE CERVICAL;  Surgeon: Izell Harari, MD;  Location: [facility] LD ORS;  Service: Gynecology;  Laterality: N/A;   CESAREAN SECTION     FOOT SURGERY     FOOT SURGERY      OB History     Gravida  7   Para  3   Term  1   Preterm  0   AB  3   Living  3      SAB  3   IAB  0   Ectopic  0   Multiple  0   Live Births  3            Home Medications    Prior to Admission medications   Medication Sig Start Date End Date Taking? Authorizing Provider  fluconazole  (DIFLUCAN ) 150 MG tablet Take one tablet by mouth as a single dose. May repeat in 3 days if symptoms persist. 05/29/24   Rolinda Rogue, MD  ibuprofen  (ADVIL ) 800 MG tablet Take 1 tablet (800 mg total) by mouth 3 (three) times daily. 01/09/24   Claudene Lenis, NP  methocarbamol  (ROBAXIN ) 500 MG tablet Take 1 tablet (500 mg total) by mouth 2 (two) times daily. 01/09/24   Claudene Lenis, NP  metoprolol  tartrate (LOPRESSOR ) 25 MG tablet Take  0.5 tablets (12.5 mg total) by mouth 2 (two) times daily. 06/06/23   Santo Stanly LABOR, MD  metoprolol  tartrate (LOPRESSOR ) 25 MG tablet Take 1 tablet (25 mg total) by mouth every 6 (six) hours as needed. 09/07/23   Chandrasekhar, Stanly LABOR, MD  metoprolol  tartrate (LOPRESSOR ) 25 MG tablet TAKE 0.5 TABLETS BY MOUTH 2 TIMES DAILY. 10/06/23   Santo Stanly LABOR, MD  Multiple Vitamin (MULTIVITAMIN PO) Take by mouth.    [provider]  norethindrone  (MICRONOR ) 0.35 MG tablet TAKE 1 TABLET BY MOUTH EVERY DAY 03/04/24   Jennelle Riis, MD  potassium chloride  (KLOR-CON ) 10 MEQ tablet Take 1 tablet (10 mEq total) by mouth daily. 02/14/24   Griselda Norris, MD  valACYclovir  (VALTREX ) 1000 MG tablet TAKE 1 TABLET BY MOUTH DAILY FOR 5 DAYS. KEEP ON HAND AND START TAKING AT FIRST SIGNS OF A FLARE. 11/02/23    Jennelle Riis, MD    Family History Family History  Problem Relation Age of Onset   Diabetes Mother    Hyperlipidemia Mother    Hypertension Mother    Heart disease Father    Hypertension Father    Breast cancer Maternal Aunt    Colon cancer Paternal Aunt     Social History Social History   Tobacco Use   Smoking status: Every Day    Current packs/day: 0.00    Types: Cigarettes    Last attempt to quit: 05/29/2020    Years since quitting: 4.0    Passive exposure: Current   Smokeless tobacco: Never   Tobacco comments:    6 cigarettes per day  Vaping Use   Vaping status: Never Used  Substance Use Topics   Alcohol use: Yes    Comment: occasionally drinks wine   Drug use: No     Allergies   Patient has no known allergies.   Review of Systems Review of Systems  Constitutional:  Negative for chills, fever and unexpected weight change.  Gastrointestinal:  Negative for abdominal pain, diarrhea, nausea and vomiting.  Genitourinary:  Positive for vaginal pain. Negative for difficulty urinating, frequency, pelvic pain, urgency and vaginal discharge.     Physical Exam Updated Vital Signs BP 112/75 (BP Location: Right Arm)   Pulse 80   Temp 98.2 F (36.8 C) (Oral)   Resp 16   LMP 05/31/2024 (Approximate)   SpO2 98%   Physical Exam Constitutional:      Appearance: Normal appearance.  Cardiovascular:     Rate and Rhythm: Normal rate and regular rhythm.     Pulses: Normal pulses.     Heart sounds: Normal heart sounds.  Pulmonary:     Effort: Pulmonary effort is normal.     Breath sounds: Normal breath sounds.  Neurological:     Mental Status: She is alert.      ED Treatments / Results  Labs (all labs ordered are listed, but only abnormal results are displayed) Labs Reviewed  CERVICOVAGINAL ANCILLARY ONLY    EKG  Radiology No results found.  Procedures Procedures (including critical care time)  Medications Ordered in ED Medications - No data to  display   Initial Impression / Assessment and Plan / ED Course  I have reviewed the triage vital signs and the nursing notes.  Pertinent labs & imaging results that were available during my care of the patient were reviewed by me and considered in my medical decision making (see chart for details).    Vaginal cytology pending. Will treat empirically for BV and  start Flagyl  500mg  BID  for 7 days. Follow up with PCP. Return precautions given.    Final Clinical Impressions(s) / ED Diagnoses   Final diagnoses:  Vaginal discharge    New Prescriptions New Prescriptions   No medications on file

## 2024-06-26 NOTE — Discharge Instructions (Signed)
 We have sent testing for sexually transmitted infections. We will notify you of any positive results once they are received. If required, we will prescribe any medications you might need.  Please refrain from all sexual activity for at least the next seven days.

## 2024-06-27 LAB — CERVICOVAGINAL ANCILLARY ONLY
Bacterial Vaginitis (gardnerella): NEGATIVE
Candida Glabrata: NEGATIVE
Candida Vaginitis: NEGATIVE
Chlamydia: NEGATIVE
Comment: NEGATIVE
Comment: NEGATIVE
Comment: NEGATIVE
Comment: NEGATIVE
Comment: NEGATIVE
Comment: NORMAL
Neisseria Gonorrhea: NEGATIVE
Trichomonas: NEGATIVE

## 2024-06-27 NOTE — ED Provider Notes (Signed)
 [facility]  I was personally present and re-performed the exam and medical decision making and verified the service and findings are accurately documented in the student's note.  Redell Fort, MD 06/27/2024 8:36 AM   [id number] 06/26/24 Arrival Time: 1744  ASSESSMENT & PLAN:  1. Vaginal itching   2. Screening for STDs (sexually transmitted diseases)    CSN: [id number] Arrival date & time: 06/26/24  1744       History              Chief Complaint    Chief Complaint  Patient presents with   Vaginal Itching      HPI Pam Vargas is a 43 y.o. female.   Pt presents with a 2 day history of vaginal itching with some mild pain. She denies any vaginal discharge or urinary symptoms. She does report that the symptoms are similar to an episode she had a month ago when she was diagnosed with BV. At that time she was given a course of Flagyl  which she completed and symptoms resolve. Pt has been practicing vaginal douching with tap water and Dove soap. Pt is sexually active with her husband only. She does request full STI testing.     Vaginal Itching Pertinent negatives include no abdominal pain.         Past Medical History:  Diagnosis Date   Advanced maternal age in multigravida     Alpha thalassemia silent carrier     Anemia     Blood transfusion without reported diagnosis      I think so with my first delivery   Cervical cerclage suture present 06/19/2020    06/19/20: ppx mcdonald cerclage with mersilene tape. Knot anteriorly.    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   SVT (supraventricular tachycardia) (HCC)     Alpha thalassemia silent carrier 06/19/2020   Single nabothian cyst 06/03/2020   Benign cyst of breast 06/05/2017   TOBACCO USER 11/02/2009   Persistent cough for 3 weeks or longer 10/03/2008           Past Surgical History:  Procedure Laterality Date   CERVICAL CERCLAGE N/A 06/19/2020    Procedure: CERCLAGE CERVICAL;  Surgeon: Izell Harari, MD;  Location: [facility] LD ORS;  Service: Gynecology;  Laterality: N/A;   CESAREAN SECTION       FOOT SURGERY       FOOT SURGERY              OB History       Gravida  7   Para  3   Term  1   Preterm  0   AB  3   Living  3        SAB  3   IAB  0   Ectopic  0   Multiple  0   Live Births  3  Home Medications                       Prior to Admission medications   Medication Sig Start Date End Date Taking? Authorizing Provider  fluconazole  (DIFLUCAN ) 150 MG tablet Take one tablet by mouth as a single dose. May repeat in 3 days if symptoms persist. 05/29/24     Rolinda Rogue, MD  ibuprofen  (ADVIL ) 800 MG tablet Take 1 tablet (800 mg total) by mouth 3 (three) times daily. 01/09/24     Claudene Lenis, NP  methocarbamol  (ROBAXIN ) 500 MG tablet Take 1 tablet (500 mg total) by mouth 2 (two) times daily. 01/09/24     Claudene Lenis, NP  metoprolol  tartrate (LOPRESSOR ) 25 MG tablet Take 0.5 tablets (12.5 mg total) by mouth 2 (two) times daily. 06/06/23     Santo Stanly LABOR, MD  metoprolol  tartrate (LOPRESSOR ) 25 MG tablet Take 1 tablet (25 mg total) by mouth every 6 (six) hours as needed. 09/07/23     Chandrasekhar, Stanly A, MD  metoprolol  tartrate (LOPRESSOR ) 25 MG tablet TAKE 0.5 TABLETS BY MOUTH 2 TIMES DAILY. 10/06/23     Santo Stanly LABOR, MD  Multiple Vitamin (MULTIVITAMIN PO) Take by mouth.       [provider]  norethindrone  (MICRONOR ) 0.35  MG tablet TAKE 1 TABLET BY MOUTH EVERY DAY 03/04/24     Jennelle Riis, MD  potassium chloride  (KLOR-CON ) 10 MEQ tablet Take 1 tablet (10 mEq total) by mouth daily. 02/14/24     Griselda Norris, MD  valACYclovir  (VALTREX ) 1000 MG tablet TAKE 1 TABLET BY MOUTH DAILY FOR 5 DAYS. KEEP ON HAND AND START TAKING AT FIRST SIGNS OF A FLARE. 11/02/23     Jennelle Riis, MD      Family History      Family History  Problem Relation Age of Onset   Diabetes Mother     Hyperlipidemia Mother     Hypertension Mother     Heart disease Father     Hypertension Father     Breast cancer Maternal Aunt     Colon cancer Paternal Aunt            Social History Social History  Social History         Tobacco Use   Smoking status: Every Day      Current packs/day: 0.00      Types: Cigarettes      Last attempt to quit: 05/29/2020      Years since quitting: 4.0      Passive exposure: Current   Smokeless tobacco: Never   Tobacco comments:      6 cigarettes per day  Vaping Use   Vaping status: Never Used  Substance Use Topics   Alcohol use: Yes      Comment: occasionally drinks wine   Drug use: No          Allergies              Patient has no known allergies.     Review of Systems Review of Systems  Constitutional:  Negative for chills, fever and unexpected weight change.  Gastrointestinal:  Negative for abdominal pain, diarrhea, nausea and vomiting.  Genitourinary:  Positive for vaginal pain. Negative for difficulty urinating, frequency, pelvic pain, urgency and vaginal discharge.       Physical Exam Updated Vital Signs BP 112/75 (BP Location: Right Arm)   Pulse 80   Temp 98.2 F (36.8 C) (Oral)  Resp 16   LMP 05/31/2024 (Approximate)   SpO2 98%    Physical Exam Constitutional:      Appearance: Normal appearance.  Cardiovascular:     Rate and Rhythm: Normal rate and regular rhythm.     Pulses: Normal pulses.     Heart sounds: Normal heart sounds.  Pulmonary:     Effort:  Pulmonary effort is normal.     Breath sounds: Normal breath sounds.  Neurological:     Mental Status: She is alert.         ED Treatments / Results  Labs (all labs ordered are listed, but only abnormal results are displayed) Labs Reviewed  CERVICOVAGINAL ANCILLARY ONLY      EKG   Radiology Imaging Results (Last 48 hours)  No results found.     Procedures Procedures (including critical care time)   Medications Ordered in ED Medications - No data to display     Initial Impression / Assessment and Plan / ED Course  I have reviewed the triage vital signs and the nursing notes.   Pertinent labs & imaging results that were available during my care of the patient were reviewed by me and considered in my medical decision making (see chart for details).   Vaginal cytology pending. Will treat empirically for BV and start Flagyl  500mg  BID  for 7 days. Follow up with PCP. Return precautions given.      Final Clinical Impressions(s) / ED Diagnoses    Final diagnoses:  Vaginal discharge      New Prescriptions    New Prescriptions    No medications on file        Rolinda Rogue, MD 06/27/24 [phone]

## 2024-07-07 ENCOUNTER — Ambulatory Visit (HOSPITAL_COMMUNITY)
Admission: EM | Admit: 2024-07-07 | Discharge: 2024-07-07 | Disposition: A | Discharge status: Home / Self Care | Attending: Emergency Medicine | Admitting: Emergency Medicine

## 2024-07-07 ENCOUNTER — Other Ambulatory Visit: Payer: Self-pay

## 2024-07-07 ENCOUNTER — Encounter (HOSPITAL_COMMUNITY): Payer: Self-pay | Admitting: Emergency Medicine

## 2024-07-07 DIAGNOSIS — T63441A Toxic effect of venom of bees, accidental (unintentional), initial encounter: Secondary | ICD-10-CM | POA: Insufficient documentation

## 2024-07-07 DIAGNOSIS — N760 Acute vaginitis: Secondary | ICD-10-CM | POA: Diagnosis not present

## 2024-07-07 DIAGNOSIS — L309 Dermatitis, unspecified: Secondary | ICD-10-CM | POA: Diagnosis not present

## 2024-07-07 MED ORDER — CETIRIZINE HCL 10 MG PO TABS: 10.0000 mg | ORAL_TABLET | Freq: Every day | ORAL | 2 refills | Status: AC

## 2024-07-07 MED ORDER — TRIAMCINOLONE ACETONIDE 0.1 % EX OINT: 1.0000 | TOPICAL_OINTMENT | Freq: Two times a day (BID) | CUTANEOUS | 1 refills | Status: AC

## 2024-07-07 NOTE — ED Triage Notes (Signed)
 Pt c/o vaginal itchiness for few days, an insect bite on her right foot and spider bite on her bottom.

## 2024-07-07 NOTE — ED Provider Notes (Signed)
 [facility]    CSN: [id number] Arrival date & time: 07/07/24  1020    HISTORY   Chief Complaint  Patient presents with   multiple complains   HPI Pam Vargas is a pleasant, 43 y.o. female who presents to urgent care today. Patient complains of vaginal itchiness for the past few days.  States she is currently sexually active with her husband only and is not concerned about possible exposure to STD.  Patient denies vaginal discharge.  Patient reports a history of BV in the past, states symptoms feel similar at this time but has not noted a fishy odor.  Denies burning with urination, suprapubic pain, genital lesion, concern for exposure to STD.  Patient states she was stung by a bee on the bottom of her right foot yesterday, states she has noticed some mild localized erythema and swelling but complains most that the area is very itchy at this time.  Patient has noticed a patch of darkened skin on her bottom that she initially thought was just an insect bite but now is concerned that the surrounding area has become dark in color.  Patient reports a history of eczema in the past but has never had it in this location.  The history is provided by the patient.    Past Medical History:  Diagnosis Date   Advanced maternal age in multigravida    Alpha thalassemia silent carrier    Anemia    Blood transfusion without reported diagnosis    I think so with my first delivery   Cervical cerclage suture present 06/19/2020   06/19/20: ppx mcdonald cerclage with mersilene tape. Knot anteriorly.    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   SVT (supraventricular tachycardia) (HCC)    Alpha thalassemia silent carrier 06/19/2020   Single nabothian cyst 06/03/2020   Benign cyst of breast 06/05/2017   TOBACCO USER 11/02/2009   Persistent cough for 3 weeks or longer 10/03/2008   Past Surgical History:  Procedure Laterality Date   CERVICAL CERCLAGE N/A 06/19/2020   Procedure: CERCLAGE CERVICAL;  Surgeon: Izell Harari, MD;  Location: [facility];  Service: Gynecology;  Laterality: N/A;   CESAREAN SECTION     FOOT SURGERY     FOOT SURGERY     OB History     Gravida  7   Para  3   Term  1   Preterm  0   AB  3   Living  3      SAB  3   IAB  0   Ectopic  0   Multiple  0   Live Births  3          Home Medications    Prior to Admission medications   Medication Sig Start Date End Date Taking? Authorizing Provider  fluconazole  (DIFLUCAN ) 150 MG tablet Take one tablet by mouth as a single dose. May repeat in 3 days if symptoms persist. 05/29/24   Rolinda Rogue, MD  ibuprofen  (ADVIL ) 800 MG tablet  Take 1 tablet (800 mg total) by mouth 3 (three) times daily. 01/09/24   Claudene Lenis, NP  methocarbamol  (ROBAXIN ) 500 MG tablet Take 1 tablet (500 mg total) by mouth 2 (two) times daily. 01/09/24   Claudene Lenis, NP  metoprolol  tartrate (LOPRESSOR ) 25 MG tablet Take 0.5 tablets (12.5 mg total) by mouth 2 (two) times daily. 06/06/23   Santo Stanly LABOR, MD  metoprolol  tartrate (LOPRESSOR ) 25 MG tablet Take 1 tablet (25 mg total) by mouth every 6 (six) hours as needed. 09/07/23   Chandrasekhar, Stanly A, MD  metoprolol  tartrate (LOPRESSOR ) 25 MG tablet TAKE 0.5 TABLETS BY MOUTH 2 TIMES DAILY. 10/06/23   Chandrasekhar, Stanly LABOR, MD  metroNIDAZOLE  (FLAGYL ) 500 MG tablet Take 1 tablet (500 mg total) by mouth 2 (two) times daily. 06/26/24   Rolinda Rogue, MD  Multiple Vitamin (MULTIVITAMIN  PO) Take by mouth.    [provider]  norethindrone  (MICRONOR ) 0.35 MG tablet TAKE 1 TABLET BY MOUTH EVERY DAY 03/04/24   Jennelle Riis, MD  potassium chloride  (KLOR-CON ) 10 MEQ tablet Take 1 tablet (10 mEq total) by mouth daily. 02/14/24   Griselda Norris, MD  valACYclovir  (VALTREX ) 1000 MG tablet TAKE 1 TABLET BY MOUTH DAILY FOR 5 DAYS. KEEP ON HAND AND START TAKING AT FIRST SIGNS OF A FLARE. 11/02/23   Jennelle Riis, MD    Family History Family History  Problem Relation Age of Onset   Diabetes Mother    Hyperlipidemia Mother    Hypertension Mother    Heart disease Father    Hypertension Father    Breast cancer Maternal Aunt    Colon cancer Paternal Aunt    Social History Social History   Tobacco Use   Smoking status: Every Day    Current packs/day: 0.00    Types: Cigarettes    Last attempt to quit: 05/29/2020    Years since quitting: 4.1    Passive exposure: Current   Smokeless tobacco: Never   Tobacco comments:    6 cigarettes per day  Vaping Use   Vaping status: Never Used  Substance Use Topics   Alcohol use: Yes    Comment: occasionally drinks wine   Drug use: No   Allergies   Patient has no known allergies.  Review of Systems Review of Systems Pertinent findings revealed after performing a 14 point review of systems has been noted in the history of present illness.  Physical Exam Vital Signs BP 106/69 (BP Location: Right Arm)   Pulse 66   Temp 98.2 F (36.8 C) (Oral)   Resp 18   LMP 05/31/2024 (Approximate)   SpO2 98%   No data found.  Physical Exam Vitals and nursing note reviewed.  Constitutional:      General: She is awake. She is not in acute distress.    Appearance: Normal appearance. She is well-developed and well-groomed. She is not ill-appearing.  HENT:     Head: Normocephalic and atraumatic.  Neck:     Trachea: Trachea and phonation normal.  Cardiovascular:     Rate and Rhythm: Normal rate and regular rhythm.  Pulmonary:      Effort: Pulmonary effort is normal.  Musculoskeletal:        General: Normal range of motion.     Cervical back: Full passive range of motion without pain, normal range of motion and neck supple.  Lymphadenopathy:     Cervical: No cervical adenopathy.  Skin:    General: Skin is warm and dry.  Findings: Erythema (Instep of right foot with a 2 cm area of erythema without signs of drainage, central punctate, excoriation, induration or tenderness to palpation) and rash (Macular area of darkened, dry, slightly scaly skin with rough texture located on right buttock approximately 4 x 2 cm in size without signs of excoriation, drainage, central punctate, induration, erythema.) present.  Neurological:     General: No focal deficit present.     Mental Status: She is alert and oriented to person, place, and time. Mental status is at baseline.  Psychiatric:        Attention and Perception: Attention and perception normal.        Mood and Affect: Mood normal.        Speech: Speech normal.        Behavior: Behavior normal. Behavior is cooperative.        Thought Content: Thought content normal.     Visual Acuity Right Eye Distance:   Left Eye Distance:   Bilateral Distance:    Right Eye Near:   Left Eye Near:    Bilateral Near:     UC Couse / Diagnostics / Procedures:     Radiology No results found.  Procedures Procedures (including critical care time) EKG  Pending results:  Labs Reviewed  CERVICOVAGINAL ANCILLARY ONLY    Medications Ordered in UC: Medications - No data to display  UC Diagnoses / Final Clinical Impressions(s)   I have reviewed the triage vital signs and the nursing notes.  Pertinent labs & imaging results that were available during my care of the patient were reviewed by me and considered in my medical decision making (see chart for details).    Final diagnoses:  Vulvovaginitis  Bee sting, accidental or unintentional, initial encounter  Eczema,  unspecified type   STD screening was performed, patient advised that the results be posted to their MyChart and if any of the results are positive, they will be notified by phone, further treatment will be provided as indicated based on results of STD screening. Patient was advised to abstain from sexual intercourse until that they receive the results of their STD testing.  Patient was also advised to use condoms to protect themselves from STD exposure.  Patient provided with Zyrtec  to calm skin which is likely inflamed at this time due to bee sting causing patches of eczema.  Patient provided with topical steroid and advised to only use on bottom and foot until symptoms resolved.  Return precautions advised.  Please see discharge instructions below for details of plan of care as provided to patient. ED Prescriptions     Medication Sig Dispense Auth. Provider   triamcinolone  ointment (KENALOG ) 0.1 % Apply 1 Application topically 2 (two) times daily. Apply to affected areas of foot and left buttock twice daily, do not apply to face. 30 g Joesph Shaver Scales, PA-C   cetirizine  (ZYRTEC  ALLERGY) 10 MG tablet Take 1 tablet (10 mg total) by mouth at bedtime. 30 tablet Joesph Shaver Scales, PA-C      PDMP not reviewed this encounter.  Disposition Upon Discharge:  Condition: stable for discharge home  Patient presented with concern for an acute illness with associated systemic symptoms and significant discomfort requiring urgent management. In my opinion, this is a condition that a prudent lay person (someone who possesses an average knowledge of health and medicine) may potentially expect to result in complications if not addressed urgently such as respiratory distress, impairment of bodily function or dysfunction of bodily organs.  As such, the patient has been evaluated and assessed, work-up was performed and treatment was provided in alignment with urgent care protocols and evidence based  medicine.  Patient/parent/caregiver has been advised that the patient may require follow up for further testing and/or treatment if the symptoms continue in spite of treatment, as clinically indicated and appropriate.  Routine symptom specific, illness specific and/or disease specific instructions were discussed with the patient and/or caregiver at length.  Prevention strategies for avoiding STD exposure were also discussed.  The patient will follow up with their current PCP if and as advised. If the patient does not currently have a PCP we will assist them in obtaining one.   The patient may need specialty follow up if the symptoms continue, in spite of conservative treatment and management, for further workup, evaluation, consultation and treatment as clinically indicated and appropriate.  Patient/parent/caregiver verbalized understanding and agreement of plan as discussed.  All questions were addressed during visit.  Please see discharge instructions below for further details of plan.    Discharge Instructions      Your symptoms and my physical exam findings are concerning for exacerbation of your underlying allergies.     Please read below to learn more about the medications, dosages and frequencies that I recommend to help alleviate your symptoms and to get you feeling better soon:  Zyrtec  (cetirizine ): This is an excellent second-generation antihistamine that helps to reduce respiratory inflammatory response to environmental allergens.  In some patients, this medication can cause daytime sleepiness so I recommend that you take 1 tablet daily at bedtime.  I recommend that you continue this antihistamine for 90 days to help prevent recurring patches of eczema.  Topical Kenalog  (triamcinolone ): This is a topical steroid that can be applied to the affected area of the skin twice daily.  Please use it exactly as directed by the prescription instructions.  Different strengths of topical steroids  are used to treat different areas of the body so it is important that this steroid is only applied to the areas for which it has been prescribed.  Unnecessary exposure to topical steroids can cause bleaching of the skin which can be permanent so please immediately discontinue use of this topical steroid once the skin condition is resolved.   The results of your vaginal swab test which screens for BV, yeast, gonorrhea, chlamydia and trichomonas will be posted to your MyChart account once it is complete.  This typically takes 2 to 4 days.  Please abstain from sexual intercourse of any kind, vaginal, oral or anal, until you have received the results of your STD testing.      If any of your results are abnormal, you will receive a phone call regarding treatment.  Prescriptions, if any are needed, will be provided for you at your pharmacy.      If symptoms have not meaningfully improved in the next 5 to 7 days, please return for repeat evaluation or follow-up with your regular provider.  If symptoms have worsened in the next 3 to 5 days, please return for repeat evaluation or follow-up with your regular provider.    Thank you for visiting [facility] today.  We appreciate the opportunity to participate in your care.        This office note has been dictated using Teaching laboratory technician.  Unfortunately, this method of dictation can sometimes lead to typographical or grammatical errors.  I apologize for your inconvenience in advance if this occurs.  Please do not hesitate to reach out to me if clarification is needed.       Joesph Shaver Scales, PA-C 07/07/24 1231

## 2024-07-07 NOTE — Discharge Instructions (Signed)
 Your symptoms and my physical exam findings are concerning for exacerbation of your underlying allergies.     Please read below to learn more about the medications, dosages and frequencies that I recommend to help alleviate your symptoms and to get you feeling better soon:  Zyrtec  (cetirizine ): This is an excellent second-generation antihistamine that helps to reduce respiratory inflammatory response to environmental allergens.  In some patients, this medication can cause daytime sleepiness so I recommend that you take 1 tablet daily at bedtime.  I recommend that you continue this antihistamine for 90 days to help prevent recurring patches of eczema.  Topical Kenalog  (triamcinolone ): This is a topical steroid that can be applied to the affected area of the skin twice daily.  Please use it exactly as directed by the prescription instructions.  Different strengths of topical steroids are used to treat different areas of the body so it is important that this steroid is only applied to the areas for which it has been prescribed.  Unnecessary exposure to topical steroids can cause bleaching of the skin which can be permanent so please immediately discontinue use of this topical steroid once the skin condition is resolved.   The results of your vaginal swab test which screens for BV, yeast, gonorrhea, chlamydia and trichomonas will be posted to your MyChart account once it is complete.  This typically takes 2 to 4 days.  Please abstain from sexual intercourse of any kind, vaginal, oral or anal, until you have received the results of your STD testing.      If any of your results are abnormal, you will receive a phone call regarding treatment.  Prescriptions, if any are needed, will be provided for you at your pharmacy.      If symptoms have not meaningfully improved in the next 5 to 7 days, please return for repeat evaluation or follow-up with your regular provider.  If symptoms have worsened in the next 3 to 5  days, please return for repeat evaluation or follow-up with your regular provider.    Thank you for visiting [facility] today.  We appreciate the opportunity to participate in your care.

## 2024-07-08 ENCOUNTER — Ambulatory Visit (HOSPITAL_COMMUNITY): Payer: Self-pay

## 2024-07-08 LAB — CERVICOVAGINAL ANCILLARY ONLY
Bacterial Vaginitis (gardnerella): NEGATIVE
Candida Glabrata: POSITIVE — AB
Candida Vaginitis: POSITIVE — AB
Chlamydia: NEGATIVE
Comment: NEGATIVE
Comment: NEGATIVE
Comment: NEGATIVE
Comment: NEGATIVE
Comment: NEGATIVE
Comment: NORMAL
Neisseria Gonorrhea: NEGATIVE
Trichomonas: NEGATIVE

## 2024-07-08 MED ORDER — FLUCONAZOLE 150 MG PO TABS: 150.0000 mg | ORAL_TABLET | Freq: Once | ORAL | 0 refills | Status: AC

## 2024-07-29 ENCOUNTER — Ambulatory Visit (HOSPITAL_COMMUNITY)
Admission: EM | Admit: 2024-07-29 | Discharge: 2024-07-29 | Disposition: A | Discharge status: Home / Self Care | Attending: Family Medicine | Admitting: Family Medicine

## 2024-07-29 ENCOUNTER — Encounter (HOSPITAL_COMMUNITY): Payer: Self-pay

## 2024-07-29 DIAGNOSIS — R8290 Unspecified abnormal findings in urine: Secondary | ICD-10-CM

## 2024-07-29 DIAGNOSIS — G43909 Migraine, unspecified, not intractable, without status migrainosus: Secondary | ICD-10-CM

## 2024-07-29 DIAGNOSIS — N760 Acute vaginitis: Secondary | ICD-10-CM

## 2024-07-29 DIAGNOSIS — Z202 Contact with and (suspected) exposure to infections with a predominantly sexual mode of transmission: Secondary | ICD-10-CM

## 2024-07-29 LAB — POCT URINALYSIS DIP (MANUAL ENTRY)
Bilirubin, UA: NEGATIVE
Glucose, UA: NEGATIVE mg/dL
Ketones, POC UA: NEGATIVE mg/dL
Leukocytes, UA: NEGATIVE
Nitrite, UA: NEGATIVE
Protein Ur, POC: NEGATIVE mg/dL
Spec Grav, UA: 1.02 (ref 1.010–1.025)
Urobilinogen, UA: 0.2 U/dL
pH, UA: 6 (ref 5.0–8.0)

## 2024-07-29 LAB — HIV ANTIBODY (ROUTINE TESTING W REFLEX): HIV Screen 4th Generation wRfx: NONREACTIVE

## 2024-07-29 MED ORDER — KETOROLAC TROMETHAMINE 30 MG/ML IJ SOLN
INTRAMUSCULAR | Status: AC
  Filled 2024-07-29: qty 1

## 2024-07-29 MED ORDER — KETOROLAC TROMETHAMINE 30 MG/ML IJ SOLN
30.0000 mg | Freq: Once | INTRAMUSCULAR | Status: AC
  Administered 2024-07-29: 30 mg via INTRAMUSCULAR

## 2024-07-29 NOTE — Discharge Instructions (Addendum)
 The urinalysis is clear.  Staff will notify you if there is anything positive on the swab or blood work. It can take 2-3 days for the tests to result, depending on the day of the week your test was taken. You will only be notified if there are any positives on the testing; test results will also go to your MyChart if you are signed up for MyChart.    You have been given a shot of Toradol  30 mg today.

## 2024-07-29 NOTE — ED Triage Notes (Signed)
 Patient is requesting STD testing and Blood work. Patient states when she urinates it is cloudy. Patient denies vaginal discharge and lower abdominal pain.  Patient is also c/o a frontal headache and at the top of her eyes.x 1 week. Patient c/o slight light sensitivity.

## 2024-07-29 NOTE — ED Provider Notes (Addendum)
 [facility]    CSN: [id number] Arrival date & time: 07/29/24  1735      History   Chief Complaint Chief Complaint  Patient presents with   STD testing   Headache    HPI Pam Vargas is a 43 y.o. female.    Headache Here for frontal headache has been bothering her off and on for the last week or so.  No fever and no cough or congestion.  She has had some photophobia and the headache is pounding.  No nausea or vomiting.  She also has had a little bit of vaginal discharge when she wipes and her urine has been cloudy.  The cloudiness might have cleared up when she drink more water. No dysuria no hematuria. SABRA  NKDA  Last menstrual cycle began September 9.  Past Medical History:  Diagnosis Date   Advanced maternal age in multigravida    Alpha thalassemia silent carrier    Anemia    Blood transfusion without reported diagnosis    I think so with my first delivery   Cervical cerclage suture present 06/19/2020   06/19/20: ppx mcdonald cerclage with mersilene tape. Knot anteriorly.    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   SVT (supraventricular tachycardia)    Alpha thalassemia silent carrier 06/19/2020   Single nabothian cyst 06/03/2020   Benign cyst of breast 06/05/2017   TOBACCO USER 11/02/2009   Persistent cough for 3 weeks or longer 10/03/2008    Past Surgical History:  Procedure Laterality Date   CERVICAL CERCLAGE N/A 06/19/2020   Procedure: CERCLAGE CERVICAL;  Surgeon:  Izell Harari, MD;  Location: [facility];  Service: Gynecology;  Laterality: N/A;   CESAREAN SECTION     FOOT SURGERY     FOOT SURGERY      OB History     Gravida  7   Para  3   Term  1   Preterm  0   AB  3   Living  3      SAB  3   IAB  0   Ectopic  0   Multiple  0   Live Births  3            Home Medications    Prior to Admission medications   Medication Sig Start Date End Date Taking? Authorizing Provider  cetirizine  (ZYRTEC  ALLERGY) 10 MG tablet Take 1 tablet (10 mg total) by mouth at bedtime. 07/07/24 10/05/24  Joesph Shaver Scales, PA-C  metoprolol  tartrate (LOPRESSOR ) 25 MG tablet TAKE 0.5 TABLETS BY MOUTH 2 TIMES DAILY. 10/06/23   Santo Stanly LABOR, MD  Multiple Vitamin (MULTIVITAMIN PO) Take by mouth.    [provider]  norethindrone  (MICRONOR ) 0.35 MG tablet TAKE 1 TABLET BY MOUTH EVERY DAY 03/04/24   Jennelle Riis, MD  triamcinolone  ointment (KENALOG ) 0.1 % Apply 1 Application topically 2 (two) times daily. Apply to affected areas of foot and left buttock twice daily, do not apply to face. 07/07/24   Joesph,  Lindsay Scales, PA-C  valACYclovir  (VALTREX ) 1000 MG tablet TAKE 1 TABLET BY MOUTH DAILY FOR 5 DAYS. KEEP ON HAND AND START TAKING AT FIRST SIGNS OF A FLARE. 11/02/23   Jennelle Riis, MD    Family History Family History  Problem Relation Age of Onset   Diabetes Mother    Hyperlipidemia Mother    Hypertension Mother    Heart disease Father    Hypertension Father    Breast cancer Maternal Aunt    Colon cancer Paternal Aunt     Social History Social History   Tobacco Use   Smoking status: Every Day    Current packs/day: 0.00    Types: Cigarettes    Last attempt to quit: 05/29/2020    Years since quitting: 4.1    Passive exposure: Current   Smokeless tobacco: Never   Tobacco comments:    6 cigarettes per day  Vaping Use   Vaping status: Never Used  Substance Use Topics   Alcohol use: Yes    Comment: occasionally  drinks wine   Drug use: No     Allergies   Patient has no known allergies.   Review of Systems Review of Systems  Neurological:  Positive for headaches.     Physical Exam Triage Vital Signs ED Triage Vitals [07/29/24 1817]  Encounter Vitals Group     BP 101/67     Girls Systolic BP Percentile      Girls Diastolic BP Percentile      Boys Systolic BP Percentile      Boys Diastolic BP Percentile      Pulse Rate 96     Resp 14     Temp 98.3 F (36.8 C)     Temp Source Oral     SpO2 96 %     Weight      Height      Head Circumference      Peak Flow      Pain Score 9     Pain Loc      Pain Education      Exclude from Growth Chart    No data found.  Updated Vital Signs BP 101/67 (BP Location: Left Arm)   Pulse 96   Temp 98.3 F (36.8 C) (Oral)   Resp 14   LMP 07/09/2024 (Exact Date)   SpO2 96%   Visual Acuity Right Eye Distance:   Left Eye Distance:   Bilateral Distance:    Right Eye Near:   Left Eye Near:    Bilateral Near:     Physical Exam Vitals reviewed.  Constitutional:      General: She is not in acute distress.    Appearance: She is not ill-appearing, toxic-appearing or diaphoretic.  HENT:     Nose: Nose normal.     Mouth/Throat:     Mouth: Mucous membranes are moist.  Eyes:     Extraocular Movements: Extraocular movements intact.     Conjunctiva/sclera: Conjunctivae normal.     Pupils: Pupils are equal, round, and reactive to light.  Cardiovascular:     Rate and Rhythm: Normal rate and regular rhythm.     Heart sounds: No murmur heard. Pulmonary:     Effort: Pulmonary effort is normal.     Breath sounds: Normal breath sounds.  Abdominal:     General: There is no distension.     Palpations: Abdomen is soft.     Tenderness: There is no abdominal tenderness. There is no guarding.  Musculoskeletal:  Cervical back: Neck supple.  Lymphadenopathy:     Cervical: No cervical adenopathy.  Skin:    Coloration: Skin is not pale.   Neurological:     General: No focal deficit present.     Mental Status: She is alert and oriented to person, place, and time.  Psychiatric:        Behavior: Behavior normal.      UC Treatments / Results  Labs (all labs ordered are listed, but only abnormal results are displayed) Labs Reviewed  POCT URINALYSIS DIP (MANUAL ENTRY) - Abnormal; Notable for the following components:      Result Value   Blood, UA trace-intact (*)    All other components within normal limits  HIV ANTIBODY (ROUTINE TESTING W REFLEX)  RPR  CERVICOVAGINAL ANCILLARY ONLY    EKG   Radiology No results found.  Procedures Procedures (including critical care time)  Medications Ordered in UC Medications  ketorolac  (TORADOL ) 30 MG/ML injection 30 mg (has no administration in time range)    Initial Impression / Assessment and Plan / UC Course  I have reviewed the triage vital signs and the nursing notes.  Pertinent labs & imaging results that were available during my care of the patient were reviewed by me and considered in my medical decision making (see chart for details).     Urinalysis is negative except for a trace of blood.  Blood is drawn for HIV and RPR, and staff will notify her if any of that is positive   Vaginal self swab is done, and we will notify of any positives on that and treat per protocol.  Toradol  injection is given here for the possible migraine headache. Final Clinical Impressions(s) / UC Diagnoses   Final diagnoses:  Acute vaginitis  Exposure to STD  Cloudy urine  Migraine without status migrainosus, not intractable, unspecified migraine type     Discharge Instructions      The urinalysis is clear.  Staff will notify you if there is anything positive on the swab or blood work. It can take 2-3 days for the tests to result, depending on the day of the week your test was taken. You will only be notified if there are any positives on the testing; test results will  also go to your MyChart if you are signed up for MyChart.    You have been given a shot of Toradol  30 mg today.       ED Prescriptions   None    PDMP not reviewed this encounter.   Vonna Sharlet POUR, MD 07/29/24 CLAIR    Vonna Sharlet POUR, MD 07/29/24 [phone]

## 2024-07-30 LAB — CERVICOVAGINAL ANCILLARY ONLY
Bacterial Vaginitis (gardnerella): NEGATIVE
Candida Glabrata: POSITIVE — AB
Candida Vaginitis: NEGATIVE
Chlamydia: NEGATIVE
Comment: NEGATIVE
Comment: NEGATIVE
Comment: NEGATIVE
Comment: NEGATIVE
Comment: NEGATIVE
Comment: NORMAL
Neisseria Gonorrhea: NEGATIVE
Trichomonas: NEGATIVE

## 2024-07-30 LAB — RPR: RPR Ser Ql: NONREACTIVE

## 2024-07-31 ENCOUNTER — Ambulatory Visit (HOSPITAL_COMMUNITY): Payer: Self-pay

## 2024-07-31 MED ORDER — FLUCONAZOLE 150 MG PO TABS: 150.0000 mg | ORAL_TABLET | Freq: Once | ORAL | 0 refills | Status: AC

## 2024-08-26 ENCOUNTER — Encounter (HOSPITAL_COMMUNITY): Payer: Self-pay | Admitting: *Deleted

## 2024-08-26 ENCOUNTER — Ambulatory Visit (HOSPITAL_COMMUNITY)
Admission: EM | Admit: 2024-08-26 | Discharge: 2024-08-26 | Disposition: A | Discharge status: Home / Self Care | Attending: Family Medicine | Admitting: Family Medicine

## 2024-08-26 DIAGNOSIS — K047 Periapical abscess without sinus: Secondary | ICD-10-CM | POA: Diagnosis present

## 2024-08-26 DIAGNOSIS — N760 Acute vaginitis: Secondary | ICD-10-CM

## 2024-08-26 MED ORDER — AMOXICILLIN-POT CLAVULANATE 875-125 MG PO TABS: 1.0000 | ORAL_TABLET | Freq: Two times a day (BID) | ORAL | 0 refills | Status: AC

## 2024-08-26 MED ORDER — FLUCONAZOLE 150 MG PO TABS: 150.0000 mg | ORAL_TABLET | ORAL | 0 refills | Status: AC

## 2024-08-26 MED ORDER — KETOROLAC TROMETHAMINE 30 MG/ML IJ SOLN
INTRAMUSCULAR | Status: AC
  Filled 2024-08-26: qty 1

## 2024-08-26 MED ORDER — KETOROLAC TROMETHAMINE 30 MG/ML IJ SOLN
30.0000 mg | Freq: Once | INTRAMUSCULAR | Status: AC
  Administered 2024-08-26: 30 mg via INTRAMUSCULAR

## 2024-08-26 MED ORDER — KETOROLAC TROMETHAMINE 10 MG PO TABS: 10.0000 mg | ORAL_TABLET | Freq: Four times a day (QID) | ORAL | 0 refills | Status: DC | PRN

## 2024-08-26 NOTE — ED Triage Notes (Signed)
 Pt states that she has vaginal itching X 5 days, she has not used any OTC meds.   She complains of upper left sided dental pain X 5 days. She has been taking IBU without relief. She did use a heating pad on left side of the face.

## 2024-08-26 NOTE — Discharge Instructions (Signed)
 You have been given a shot of Toradol  30 mg today.  Ketorolac  10 mg tablets--take 1 tablet every 6 hours as needed for pain.  This is the same medicine that is in the shot we just gave you  Take amoxicillin -clavulanate 875 mg--1 tab twice daily with food for 7 days  Take fluconazole  150 mg--1 tablet every 3 days for 3 doses  Staff will notify you if there is anything positive on the swab. It can take 2-3 days for the tests to result, depending on the day of the week your test was taken. You will only be notified if there are any positives on the testing; test results will also go to your MyChart if you are signed up for MyChart.

## 2024-08-26 NOTE — ED Provider Notes (Signed)
 [facility]    CSN: [id number] Arrival date & time: 08/26/24  1153      History   Chief Complaint Chief Complaint  Patient presents with   Vaginal Itching   Dental Pain    HPI Pam Vargas is a 43 y.o. female.    Vaginal Itching  Dental Pain Here for vaginal itching that began about 5 days ago.  She has a little bit of discharge and some odor.  No pelvic pain and no fever.  No dysuria.  She had similar symptoms at the end of September and tested positive for yeast.  The symptoms did not improve and resolved with treatment   She also has some pain in her left upper teeth for about 5 days also.  Ibuprofen  has not been helping.  NKDA  Last menstrual cycle October 10 Past Medical History:  Diagnosis Date   Advanced maternal age in multigravida    Alpha thalassemia silent carrier    Anemia    Blood transfusion without reported diagnosis    I think so with my first delivery   Cervical cerclage suture present 06/19/2020   06/19/20: ppx mcdonald cerclage with mersilene tape. Knot anteriorly.    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   SVT (supraventricular tachycardia)    Alpha thalassemia silent carrier 06/19/2020   Single nabothian cyst 06/03/2020   Benign cyst of breast 06/05/2017   TOBACCO USER 11/02/2009   Persistent cough for 3 weeks or longer 10/03/2008    Past Surgical History:  Procedure Laterality Date   CERVICAL CERCLAGE N/A 06/19/2020   Procedure: CERCLAGE  CERVICAL;  Surgeon: Izell Harari, MD;  Location: [facility];  Service: Gynecology;  Laterality: N/A;   CESAREAN SECTION     FOOT SURGERY     FOOT SURGERY      OB History     Gravida  7   Para  3   Term  1   Preterm  0   AB  3   Living  3      SAB  3   IAB  0   Ectopic  0   Multiple  0   Live Births  3            Home Medications    Prior to Admission medications   Medication Sig Start Date End Date Taking? Authorizing Provider  amoxicillin -clavulanate (AUGMENTIN ) 875-125 MG tablet Take 1 tablet by mouth 2 (two) times daily for 7 days. 08/26/24 09/02/24 Yes Vonna Sharlet POUR, MD  fluconazole  (DIFLUCAN ) 150 MG tablet Take 1 tablet (150 mg total) by mouth every 3 (three) days for 3 doses. 08/26/24 09/02/24 Yes Vonna Sharlet POUR, MD  ketorolac  (TORADOL ) 10 MG tablet Take 1 tablet (10 mg total) by mouth every 6 (six) hours as needed (pain). 08/26/24  Yes Vonna Sharlet POUR, MD  metoprolol  tartrate (LOPRESSOR ) 25 MG tablet TAKE 0.5 TABLETS BY MOUTH 2 TIMES DAILY. 10/06/23  Yes Chandrasekhar, Stanly LABOR, MD  Multiple Vitamin (MULTIVITAMIN PO) Take  by mouth.   Yes [provider]  norethindrone  (MICRONOR ) 0.35 MG tablet TAKE 1 TABLET BY MOUTH EVERY DAY 03/04/24  Yes Sowell, Penne, MD  cetirizine  (ZYRTEC  ALLERGY) 10 MG tablet Take 1 tablet (10 mg total) by mouth at bedtime. 07/07/24 10/05/24  Joesph Shaver Scales, PA-C  triamcinolone  ointment (KENALOG ) 0.1 % Apply 1 Application topically 2 (two) times daily. Apply to affected areas of foot and left buttock twice daily, do not apply to face. 07/07/24   Joesph Shaver Scales, PA-C  valACYclovir  (VALTREX ) 1000 MG tablet TAKE 1 TABLET BY MOUTH DAILY FOR 5 DAYS. KEEP ON HAND AND START TAKING AT FIRST SIGNS OF A FLARE. 11/02/23   Jennelle Penne, MD    Family History Family History  Problem Relation Age of Onset   Diabetes Mother    Hyperlipidemia Mother    Hypertension Mother    Heart disease Father    Hypertension  Father    Breast cancer Maternal Aunt    Colon cancer Paternal Aunt     Social History Social History   Tobacco Use   Smoking status: Every Day    Current packs/day: 0.00    Types: Cigarettes    Last attempt to quit: 05/29/2020    Years since quitting: 4.2    Passive exposure: Current   Smokeless tobacco: Never   Tobacco comments:    6 cigarettes per day  Vaping Use   Vaping status: Never Used  Substance Use Topics   Alcohol use: Yes    Comment: occasionally drinks wine   Drug use: No     Allergies   Patient has no known allergies.   Review of Systems Review of Systems   Physical Exam Triage Vital Signs ED Triage Vitals  Encounter Vitals Group     BP 08/26/24 1225 (!) 114/50     Girls Systolic BP Percentile --      Girls Diastolic BP Percentile --      Boys Systolic BP Percentile --      Boys Diastolic BP Percentile --      Pulse Rate 08/26/24 1225 77     Resp 08/26/24 1225 18     Temp 08/26/24 1225 98 F (36.7 C)     Temp Source 08/26/24 1225 Oral     SpO2 08/26/24 1225 98 %     Weight --      Height --      Head Circumference --      Peak Flow --      Pain Score 08/26/24 1223 10     Pain Loc --      Pain Education --      Exclude from Growth Chart --    No data found.  Updated Vital Signs BP (!) 114/50 (BP Location: Right Arm)   Pulse 77   Temp 98 F (36.7 C) (Oral)   Resp 18   LMP 08/09/2024 (Exact Date)   SpO2 98%   Visual Acuity Right Eye Distance:   Left Eye Distance:   Bilateral Distance:    Right Eye Near:   Left Eye Near:    Bilateral Near:     Physical Exam Vitals reviewed.  Constitutional:      General: She is not in acute distress.    Appearance: She is not ill-appearing, toxic-appearing or diaphoretic.  HENT:     Mouth/Throat:     Mouth: Mucous membranes are moist.     Comments: There is swelling in the left dental ridge,  but no fluctuance or abscess noted.  There is some dental caries noted Cardiovascular:     Rate  and Rhythm: Normal rate and regular rhythm.     Heart sounds: No murmur heard. Pulmonary:     Effort: Pulmonary effort is normal.     Breath sounds: Normal breath sounds.  Abdominal:     Palpations: Abdomen is soft.  Musculoskeletal:     Cervical back: Neck supple.  Lymphadenopathy:     Cervical: No cervical adenopathy.  Skin:    Coloration: Skin is not pale.  Neurological:     General: No focal deficit present.     Mental Status: She is alert and oriented to person, place, and time.  Psychiatric:        Behavior: Behavior normal.      UC Treatments / Results  Labs (all labs ordered are listed, but only abnormal results are displayed) Labs Reviewed  CERVICOVAGINAL ANCILLARY ONLY    EKG   Radiology No results found.  Procedures Procedures (including critical care time)  Medications Ordered in UC Medications  ketorolac  (TORADOL ) 30 MG/ML injection 30 mg (has no administration in time range)    Initial Impression / Assessment and Plan / UC Course  I have reviewed the triage vital signs and the nursing notes.  Pertinent labs & imaging results that were available during my care of the patient were reviewed by me and considered in my medical decision making (see chart for details).     Augmentin  is sent to pharmacy for dental infection.  Fluconazole  is sent in for empiric treatment for the vaginitis, and staff will notify her if there is anything positive on the vaginal swab.  Toradol  injection was given for the pain and Toradol  tablets are sent to pharmacy  She is given contact information for low-cost dental providers Final Clinical Impressions(s) / UC Diagnoses   Final diagnoses:  Dental infection  Acute vaginitis     Discharge Instructions      You have been given a shot of Toradol  30 mg today.  Ketorolac  10 mg tablets--take 1 tablet every 6 hours as needed for pain.  This is the same medicine that is in the shot we just gave you  Take  amoxicillin -clavulanate 875 mg--1 tab twice daily with food for 7 days  Take fluconazole  150 mg--1 tablet every 3 days for 3 doses  Staff will notify you if there is anything positive on the swab. It can take 2-3 days for the tests to result, depending on the day of the week your test was taken. You will only be notified if there are any positives on the testing; test results will also go to your MyChart if you are signed up for MyChart.        ED Prescriptions     Medication Sig Dispense Auth. Provider   ketorolac  (TORADOL ) 10 MG tablet Take 1 tablet (10 mg total) by mouth every 6 (six) hours as needed (pain). 20 tablet Keatyn Jawad K, MD   amoxicillin -clavulanate (AUGMENTIN ) 875-125 MG tablet Take 1 tablet by mouth 2 (two) times daily for 7 days. 14 tablet Leba Tibbitts K, MD   fluconazole  (DIFLUCAN ) 150 MG tablet Take 1 tablet (150 mg total) by mouth every 3 (three) days for 3 doses. 3 tablet Yardley Lekas, Sharlet POUR, MD      PDMP not reviewed this encounter.   Vonna Sharlet POUR, MD 08/26/24 [phone]

## 2024-08-27 ENCOUNTER — Ambulatory Visit (HOSPITAL_COMMUNITY): Payer: Self-pay

## 2024-08-27 LAB — CERVICOVAGINAL ANCILLARY ONLY
Bacterial Vaginitis (gardnerella): POSITIVE — AB
Candida Glabrata: NEGATIVE
Candida Vaginitis: NEGATIVE
Chlamydia: NEGATIVE
Comment: NEGATIVE
Comment: NEGATIVE
Comment: NEGATIVE
Comment: NEGATIVE
Comment: NEGATIVE
Comment: NORMAL
Neisseria Gonorrhea: NEGATIVE
Trichomonas: NEGATIVE

## 2024-08-27 MED ORDER — METRONIDAZOLE 500 MG PO TABS: 500.0000 mg | ORAL_TABLET | Freq: Two times a day (BID) | ORAL | 0 refills | Status: AC

## 2024-08-28 ENCOUNTER — Emergency Department (HOSPITAL_COMMUNITY)
Admission: EM | Admit: 2024-08-28 | Discharge: 2024-08-28 | Disposition: A | Discharge status: Home / Self Care | Attending: Emergency Medicine | Admitting: Emergency Medicine

## 2024-08-28 ENCOUNTER — Encounter (HOSPITAL_COMMUNITY): Payer: Self-pay | Admitting: Emergency Medicine

## 2024-08-28 DIAGNOSIS — K047 Periapical abscess without sinus: Secondary | ICD-10-CM | POA: Insufficient documentation

## 2024-08-28 DIAGNOSIS — K0889 Other specified disorders of teeth and supporting structures: Secondary | ICD-10-CM | POA: Diagnosis present

## 2024-08-28 MED ORDER — CLINDAMYCIN HCL 150 MG PO CAPS: 450.0000 mg | ORAL_CAPSULE | Freq: Three times a day (TID) | ORAL | 0 refills | Status: AC

## 2024-08-28 MED ORDER — OXYCODONE-ACETAMINOPHEN 5-325 MG PO TABS
1.0000 | ORAL_TABLET | Freq: Once | ORAL | Status: AC
  Administered 2024-08-28: 1 via ORAL
  Filled 2024-08-28: qty 1

## 2024-08-28 MED ORDER — OXYCODONE-ACETAMINOPHEN 5-325 MG PO TABS: 1.0000 | ORAL_TABLET | Freq: Four times a day (QID) | ORAL | 0 refills | Status: AC | PRN

## 2024-08-28 NOTE — Discharge Instructions (Addendum)
 You were seen in the emergency department today for concerns of dental pain and infection. You appear to have worsening infection in this area but there is no clearly drainable abscess. I am switching your antibiotics from Augmentin  to Clindamycin to keep taking for the next several days.  She continue to take Tylenol  ibuprofen  for pain as needed.  I sent a prescription for Percocet for the next several days as needed for severe pain.  Please follow-up closely with a dentist.  Return to the emergency department for any concerns of worsening symptoms.

## 2024-08-28 NOTE — ED Provider Notes (Signed)
 [place] EMERGENCY DEPARTMENT AT [facility] Provider Note   CSN: [id number] Arrival date & time: 08/28/24  1616     Patient presents with: Dental Pain   Pam Vargas is a 43 y.o. female.  Patient with recent history of dental infection presents emergency department with concerns of dental pain.  States that she was started on Augmentin  at urgent care on Monday and has had worsening pain and increased swelling to the left upper side of her mouth.  No reported fever, chills or bodyaches.   Dental Pain      Prior to Admission medications   Medication Sig Start Date End Date Taking? Authorizing Provider  clindamycin (CLEOCIN) 150 MG capsule Take 3 capsules (450 mg total) by mouth 3 (three) times daily for 7 days. 08/28/24 09/04/24 Yes Alvina Strother A, PA-C  oxyCODONE -acetaminophen  (PERCOCET/ROXICET) 5-325 MG tablet Take 1 tablet by mouth every 6 (six) hours as needed for severe pain (pain score 7-10). 08/28/24  Yes Lucita Montoya A, PA-C  amoxicillin -clavulanate (AUGMENTIN ) 875-125 MG tablet Take 1 tablet by mouth 2 (two) times daily for 7 days. 08/26/24 09/02/24  Vonna Sharlet POUR, MD  cetirizine  (ZYRTEC  ALLERGY) 10 MG tablet Take 1 tablet (10 mg total) by mouth at bedtime. 07/07/24 10/05/24  Joesph Shaver Scales, PA-C  fluconazole  (DIFLUCAN ) 150 MG tablet Take 1 tablet (150 mg total) by mouth every 3 (three) days for 3 doses. 08/26/24 09/02/24  Vonna Sharlet POUR, MD  ketorolac  (TORADOL ) 10 MG tablet Take 1 tablet (10 mg total) by mouth every 6 (six) hours as needed (pain). 08/26/24   Vonna Sharlet POUR, MD  metoprolol  tartrate (LOPRESSOR ) 25 MG tablet TAKE 0.5 TABLETS BY MOUTH 2 TIMES DAILY. 10/06/23   Chandrasekhar, Stanly LABOR, MD  metroNIDAZOLE  (FLAGYL ) 500 MG tablet Take 1 tablet (500 mg total) by mouth 2 (two) times daily for 7 days. 08/27/24 09/03/24  Banister, Martena K, MD  Multiple Vitamin (MULTIVITAMIN PO) Take by mouth.    [provider]  norethindrone   (MICRONOR ) 0.35 MG tablet TAKE 1 TABLET BY MOUTH EVERY DAY 03/04/24   Jennelle Riis, MD  triamcinolone  ointment (KENALOG ) 0.1 % Apply 1 Application topically 2 (two) times daily. Apply to affected areas of foot and left buttock twice daily, do not apply to face. 07/07/24   Joesph Shaver Scales, PA-C  valACYclovir  (VALTREX ) 1000 MG tablet TAKE 1 TABLET BY MOUTH DAILY FOR 5 DAYS. KEEP ON HAND AND START TAKING AT FIRST SIGNS OF A FLARE. 11/02/23   Jennelle Riis, MD    Allergies: Patient has no known allergies.    Review of Systems  HENT:  Positive for dental problem.   All other systems reviewed and are negative.   Updated Vital Signs BP (!) 120/53   Pulse 90   Temp 99.1 F (37.3 C) (Oral)   Resp 16   LMP 08/09/2024 (Exact Date)   SpO2 99%   Physical Exam Vitals and nursing note reviewed.  Constitutional:      General: She is not in acute distress.    Appearance: She is well-developed.  HENT:     Head: Normocephalic and atraumatic.     Mouth/Throat:     Mouth: Mucous membranes are moist.     Pharynx: Oropharynx is clear. No oropharyngeal exudate or posterior oropharyngeal erythema.     Comments: Swelling and erythema to the left dental ridge with no area of fluctuance or thinning skin. No drainage seen. Eyes:     Conjunctiva/sclera: Conjunctivae normal.  Cardiovascular:     Rate and Rhythm: Normal rate and regular rhythm.     Heart sounds: No murmur heard. Pulmonary:     Effort: Pulmonary effort is normal. No respiratory distress.     Breath sounds: Normal breath sounds.  Abdominal:     Palpations: Abdomen is soft.     Tenderness: There is no abdominal tenderness.  Musculoskeletal:        General: No swelling.     Cervical back: Neck supple.  Skin:    General: Skin is warm and dry.     Capillary Refill: Capillary refill takes less than 2 seconds.  Neurological:     Mental Status: She is alert.  Psychiatric:        Mood and Affect: Mood normal.     (all labs  ordered are listed, but only abnormal results are displayed) Labs Reviewed - No data to display  EKG: None  Radiology: No results found.   Procedures   Medications Ordered in the ED  oxyCODONE -acetaminophen  (PERCOCET/ROXICET) 5-325 MG per tablet 1 tablet (1 tablet Oral Given 08/28/24 1800)                                    Medical Decision Making Risk Prescription drug management.   This patient presents to the ED for concern of dental pain.  Differential diagnosis includes gingivitis, dental abscess, dental infection, dental caries   Medicines ordered and prescription drug management:  I ordered medication including Percocet for pain Reevaluation of the patient after these medicines showed that the patient improved I have reviewed the patients home medicines and have made adjustments as needed   Problem List / ED Course:  Patient presents to the emergency department today with concerns of dental infection.  She reports that she was seen at urgent care few days ago and started on Augmentin .  Reports feeling that she has had worsening swelling in the last day or so to the left upper jaw.  Denies any fevers.  No reported drainage or discharge from any area within her mouth.  Has not seen a dentist yet. Physical exam reveals erythema and tenderness over the left dental ridge with no area of fluctuance or thinning skin.  No drainage seen.  No appreciable gum blood to suggest superficial abscess that can easily be drained. Given patient's exam, do not feel that she is a good candidate for incision and drainage at this time.  Still feel that best treatment plan will be continuation of antibiotic therapy and outpatient follow-up with dentist.  Patient is agreeable with this plan.  Given she feels that she is worsening after starting the Augmentin  2 days ago, this still it may not be sufficient time for improvement in her symptoms but will switch to clindamycin for more aggressive  treatment.  Patient is agreeable with this plan.  Advised patient return to the emergency department for any concerns of new or worsening symptoms.  She is otherwise stable for outpatient follow-up and discharged home.    Social Determinants of Health:  None  Final diagnoses:  Dental infection    ED Discharge Orders          Ordered    clindamycin (CLEOCIN) 150 MG capsule  3 times daily        08/28/24 1754    oxyCODONE -acetaminophen  (PERCOCET/ROXICET) 5-325 MG tablet  Every 6 hours PRN  08/28/24 1758               Cecily Legrand LABOR, PA-C 08/28/24 2247    Tegeler, Lonni PARAS, MD 08/28/24 [phone]

## 2024-08-28 NOTE — ED Triage Notes (Signed)
 Went to UC on Monday and got antibiotics and pain medicine. Been taking. Woke up this morning with swelling on left side of face and in pain. No impediment to her airway or breathing.
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
## 2024-09-13 ENCOUNTER — Ambulatory Visit: Admitting: Family Medicine

## 2024-09-13 ENCOUNTER — Encounter: Payer: Self-pay | Admitting: Family Medicine

## 2024-09-13 ENCOUNTER — Other Ambulatory Visit (HOSPITAL_COMMUNITY)
Admission: RE | Admit: 2024-09-13 | Discharge: 2024-09-13 | Disposition: A | Discharge status: Home / Self Care | Source: Ambulatory Visit | Attending: Family Medicine | Admitting: Family Medicine

## 2024-09-13 VITALS — BP 118/40 | HR 88 | Wt 115.6 lb

## 2024-09-13 DIAGNOSIS — B379 Candidiasis, unspecified: Secondary | ICD-10-CM | POA: Diagnosis present

## 2024-09-13 DIAGNOSIS — N898 Other specified noninflammatory disorders of vagina: Secondary | ICD-10-CM

## 2024-09-13 DIAGNOSIS — B009 Herpesviral infection, unspecified: Secondary | ICD-10-CM | POA: Diagnosis not present

## 2024-09-13 DIAGNOSIS — Z1211 Encounter for screening for malignant neoplasm of colon: Secondary | ICD-10-CM

## 2024-09-13 DIAGNOSIS — Z720 Tobacco use: Secondary | ICD-10-CM

## 2024-09-13 DIAGNOSIS — Z716 Tobacco abuse counseling: Secondary | ICD-10-CM | POA: Diagnosis not present

## 2024-09-13 LAB — POCT URINALYSIS DIP (MANUAL ENTRY)
Bilirubin, UA: NEGATIVE
Blood, UA: NEGATIVE
Glucose, UA: NEGATIVE mg/dL
Ketones, POC UA: NEGATIVE mg/dL
Leukocytes, UA: NEGATIVE
Nitrite, UA: NEGATIVE
Protein Ur, POC: NEGATIVE mg/dL
Spec Grav, UA: 1.025 (ref 1.010–1.025)
Urobilinogen, UA: 0.2 U/dL
pH, UA: 6.5 (ref 5.0–8.0)

## 2024-09-13 LAB — POCT WET PREP (WET MOUNT)
Clue Cells Wet Prep Whiff POC: NEGATIVE
Trichomonas Wet Prep HPF POC: ABSENT

## 2024-09-13 LAB — POCT URINE PREGNANCY: Preg Test, Ur: NEGATIVE

## 2024-09-13 MED ORDER — FLUCONAZOLE 150 MG PO TABS: 150.0000 mg | ORAL_TABLET | Freq: Once | ORAL | 0 refills | Status: DC

## 2024-09-13 MED ORDER — VALACYCLOVIR HCL 1 G PO TABS: 1000.0000 mg | ORAL_TABLET | Freq: Every day | ORAL | 1 refills | Status: AC

## 2024-09-13 NOTE — Assessment & Plan Note (Addendum)
~  15-pack-year history.  Desires to quit, has successfully cut back to 5 cigarettes/day.  Congratulated patient. Discussed various methods of smoking cessation: Use a timer between smokes, occupying hands as that may be a trigger, making it difficult for self to access pack of cigarettes.  - Patient will consider nicotine  gum for the future, contact clinic if desired - 1-800-QUIT-NOW number provided - Continue to follow-up each visit

## 2024-09-13 NOTE — Patient Instructions (Addendum)
 It was great to see you today! Thank you for choosing [facility] for your primary care.  Today we addressed:  Smoking cessation 1-800-QUIT-NOW is a resource available to help you get materials to quit.  Colon cancer screening You had a GI referral placed, they will call you to set up an appointment. Please give us  a call if you don't hear back in the next 2 weeks.  Yeast infection Please take 1 pill of the Diflucan , if still having symptoms, take anther one after 3-4 days. Your urine tests are normal. I will call you if swabs come back positive.  Shoulder pain Please take ibuprofen  and Tylenol  up to every 6 hours for the pain.  You can alternate these. You can wear lidocaine  patches for 12 hours at a time, then 12 hours off. You can use over the counter Voltaren  gel when not wearing your patch.  You should return to our clinic in 3-6 months.  Thank you for coming to see us  at [facility] and for the opportunity to care for you! Toma, Mouhamad Teed, MD 09/13/2024, 10:19 AM

## 2024-09-13 NOTE — Assessment & Plan Note (Signed)
 Historical diagnosis.  Patient request refill of Valtrex . - Valtrex  refill provided

## 2024-09-16 LAB — GC/CHLAMYDIA PROBE AMP (~~LOC~~) NOT AT ARMC
Chlamydia: NEGATIVE
Comment: NEGATIVE
Comment: NORMAL
Neisseria Gonorrhea: NEGATIVE

## 2024-09-17 ENCOUNTER — Ambulatory Visit: Payer: Self-pay | Admitting: Family Medicine

## 2024-09-24 ENCOUNTER — Other Ambulatory Visit: Payer: Self-pay | Admitting: Internal Medicine

## 2024-10-05 ENCOUNTER — Emergency Department (HOSPITAL_COMMUNITY)
Admission: EM | Admit: 2024-10-05 | Discharge: 2024-10-05 | Disposition: A | Discharge status: Home / Self Care | Attending: Emergency Medicine | Admitting: Emergency Medicine

## 2024-10-05 ENCOUNTER — Encounter (HOSPITAL_COMMUNITY): Payer: Self-pay

## 2024-10-05 ENCOUNTER — Other Ambulatory Visit: Payer: Self-pay

## 2024-10-05 DIAGNOSIS — M62830 Muscle spasm of back: Secondary | ICD-10-CM

## 2024-10-05 LAB — POC URINE PREG, ED: Preg Test, Ur: NEGATIVE

## 2024-10-05 MED ORDER — SODIUM CHLORIDE 0.9 % IV BOLUS
500.0000 mL | Freq: Once | INTRAVENOUS | Status: AC
  Administered 2024-10-05: 500 mL via INTRAVENOUS

## 2024-10-05 MED ORDER — DIAZEPAM 5 MG/ML IJ SOLN
2.5000 mg | Freq: Once | INTRAMUSCULAR | Status: AC
  Administered 2024-10-05: 2.5 mg via INTRAVENOUS
  Filled 2024-10-05: qty 2

## 2024-10-05 MED ORDER — KETOROLAC TROMETHAMINE 15 MG/ML IJ SOLN
15.0000 mg | Freq: Once | INTRAMUSCULAR | Status: AC
  Administered 2024-10-05: 15 mg via INTRAVENOUS
  Filled 2024-10-05: qty 1

## 2024-10-05 MED ORDER — IBUPROFEN 600 MG PO TABS: 600.0000 mg | ORAL_TABLET | Freq: Four times a day (QID) | ORAL | 0 refills | Status: AC | PRN

## 2024-10-05 MED ORDER — METHOCARBAMOL 500 MG PO TABS: 500.0000 mg | ORAL_TABLET | Freq: Two times a day (BID) | ORAL | 0 refills | Status: AC | PRN

## 2024-10-05 MED ORDER — LIDOCAINE 5 % EX PTCH
2.0000 | MEDICATED_PATCH | CUTANEOUS | Status: DC
  Administered 2024-10-05: 2 via TRANSDERMAL
  Filled 2024-10-05: qty 2

## 2024-10-05 NOTE — Discharge Instructions (Addendum)
 You were seen in the ER today for evaluation of your neck pain. I am glad that you are feeling better! I am going to send you home with some muscle relaxers for you to take as needed.  Please do not drive while on this medication as it will make you sleepy.  For pain, I recommend taking 600mg  of ibuprofen  and/or 1000mg  of Tylenol  every 6 hours as needed for pain.  You can pick up over-the-counter lidocaine  patches and place them to the area that you are on now.  This may help you as well.  I recommend gentle stretching and again staying well-hydrated drinking plenty of fluids, mainly water.  I included information for sports medicine provider for you to follow-up with.  Please make sure you call to schedule an appointment.  If you start developing any fever, weakness in your arms, numbness, tingling, neck stiffness, headache, vision changes, please return to nearest emerged ferment for evaluation.  If you have any other concerns, new or worsening symptoms, please return to your nearest emergency department for evaluation.  Contact a doctor if: Your symptoms get worse or do not get better after 2 weeks. Your pain gets worse. Medicine does not help your pain. You have new symptoms. Your neck collar gives you sores on your skin or bothers your skin. Get help right away if: You have very bad pain. You get any of the following in any part of your body: Loss of feeling. Tingling. Weakness. You cannot move a part of your body. You have neck pain and either of these: Very bad dizziness. A very bad headache.

## 2024-10-05 NOTE — ED Triage Notes (Signed)
 Pt reports:  Neck pain Started Thursday Worsening last night Woke up and noticed pain Symptoms Cannot turn right or left Burning sensation  OTC Pain medication Tylenol , IBU, Excedrin

## 2024-10-05 NOTE — ED Notes (Signed)
 Pt working with registration at this time. Will attempt again to call to triage

## 2024-10-05 NOTE — ED Provider Notes (Signed)
  [place] [facility]MENT AT [facility] Provider Note   CSN: [id number] Arrival date & time: 10/05/24  1334     Patient presents with: Torticollis   Pam Vargas is a 43 y.o. female with h/o SVT presents to the ER today for evaluation of posterior neck pain for the past 2-3 days. She denies any trauma or new activity, but reports she has been under a lot of stress recently. She has some pain with   HPI     Prior to Admission medications   Medication Sig Start Date End Date Taking? Authorizing Provider  cetirizine  (ZYRTEC  ALLERGY) 10 MG tablet Take 1 tablet (10 mg total) by mouth at bedtime. 07/07/24 10/05/24  Joesph Shaver Scales, PA-C  ketorolac  (TORADOL ) 10 MG tablet Take 1 tablet (10 mg total) by mouth every 6 (six) hours as needed (pain). 08/26/24   Vonna Sharlet POUR, MD  metoprolol  tartrate (LOPRESSOR ) 25 MG tablet TAKE 1/2 TABLET TWICE A DAY BY MOUTH 09/25/24   Chandrasekhar, Mahesh A, MD  Multiple Vitamin (MULTIVITAMIN PO) Take by mouth.    [provider]  norethindrone  (MICRONOR ) 0.35 MG tablet TAKE 1 TABLET BY MOUTH EVERY DAY 03/04/24   Jennelle Riis, MD  oxyCODONE -acetaminophen  (PERCOCET/ROXICET) 5-325 MG tablet Take 1 tablet by mouth every 6 (six) hours as needed for severe pain (pain score 7-10). 08/28/24   Zelaya, Oscar A, PA-C  triamcinolone  ointment (KENALOG ) 0.1 % Apply 1 Application topically 2 (two) times daily. Apply to affected areas of foot and left buttock twice daily, do not apply to face. 07/07/24   Joesph Shaver Scales, PA-C  valACYclovir  (VALTREX ) 1000 MG tablet Take 1 tablet (1,000 mg total) by mouth daily. 09/13/24   Shitarev, Dimitry, MD    Allergies: Patient has no known allergies.    Review of Systems  Updated Vital Signs BP 119/64 (BP Location: Left Arm)   Pulse 81   Temp 98.1 F (36.7 C) (Oral)   Resp 18   Ht 5' 4 (1.626 m)   Wt 52.2 kg   SpO2 100%   BMI 19.74 kg/m   Physical Exam  (all labs ordered are  listed, but only abnormal results are displayed) Labs Reviewed  POC URINE PREG, ED    EKG: None  Radiology: No results found.  {Document cardiac monitor, telemetry assessment procedure when appropriate:32947} Procedures   Medications Ordered in the ED - No data to display    {Click here for ABCD2, HEART and other calculators REFRESH Note before signing:1}                              Medical Decision Making  ***  {Document critical care time when appropriate  Document review of labs and clinical decision tools ie [id number], etc  Document your independent review of radiology images and any outside records  Document your discussion with family members, caretakers and with consultants  Document social determinants of health affecting pt's care  Document your decision making why or why not admission, treatments were needed:32947:::1}   Final diagnoses:  None    ED Discharge Orders     None

## 2024-10-08 ENCOUNTER — Encounter: Payer: Self-pay | Admitting: Family Medicine

## 2024-10-10 ENCOUNTER — Encounter: Payer: Self-pay | Admitting: Pediatrics

## 2024-10-13 ENCOUNTER — Other Ambulatory Visit: Payer: Self-pay | Admitting: Family Medicine

## 2024-10-13 DIAGNOSIS — N898 Other specified noninflammatory disorders of vagina: Secondary | ICD-10-CM

## 2024-10-13 DIAGNOSIS — B379 Candidiasis, unspecified: Secondary | ICD-10-CM

## 2024-10-14 ENCOUNTER — Encounter (HOSPITAL_COMMUNITY): Payer: Self-pay | Admitting: *Deleted

## 2024-10-14 ENCOUNTER — Ambulatory Visit (HOSPITAL_COMMUNITY)
Admission: EM | Admit: 2024-10-14 | Discharge: 2024-10-14 | Disposition: A | Discharge status: Home / Self Care | Source: Home / Self Care

## 2024-10-14 DIAGNOSIS — M62830 Muscle spasm of back: Secondary | ICD-10-CM | POA: Insufficient documentation

## 2024-10-14 DIAGNOSIS — N760 Acute vaginitis: Secondary | ICD-10-CM | POA: Diagnosis present

## 2024-10-14 DIAGNOSIS — Z113 Encounter for screening for infections with a predominantly sexual mode of transmission: Secondary | ICD-10-CM

## 2024-10-14 MED ORDER — BACLOFEN 10 MG PO TABS: 10.0000 mg | ORAL_TABLET | Freq: Three times a day (TID) | ORAL | 0 refills | Status: DC

## 2024-10-14 MED ORDER — NAPROXEN 500 MG PO TABS: 500.0000 mg | ORAL_TABLET | Freq: Two times a day (BID) | ORAL | 0 refills | Status: AC

## 2024-10-14 NOTE — Discharge Instructions (Addendum)
 Your symptoms are likely due to a pulled muscle.  Pulled muscles typically heal on their own in several days.  You may take tylenol  as needed for aches and pains.  Take muscle relaxer as needed for muscle spasm, mostly take this at bedtime as this medicine can cause drowsiness.  Apply heat to the pulled muscle 20 minutes on 20 minutes off as needed, heat relaxes muscles.  Perform gentle exercises and stretches to area of tenderness.  I would like for you to rest, however I do not want you to avoid moving the area. Movement and stretching will help with healing.  If you develop any new or worsening symptoms or if your symptoms do not start to improve, please return here or follow-up with your primary care provider. If your symptoms are severe, please go to the emergency room.   STD testing pending, this will take 2-3 days to result. We will only call you if your testing is positive for any infection(s) and we will provide treatment.  Avoid sexual intercourse until your STD results come back.  If any of your STD results are positive, you will need to avoid sexual intercourse for 7 days while you are being treated to prevent spread of STD.  Condom use is the best way to prevent spread of STDs. Notify partner(s) of any positive results.  Return to urgent care as needed.

## 2024-10-14 NOTE — ED Triage Notes (Addendum)
 C/O vaginal pruritus with foul odor onset 3 days ago; pt believes she has BV. C/O continued pain to bilat trapezius muscles from ED visit 10/05/24 -- states took the methocarbamol  and IBU Rxs, which helped, but ran out and pain continues. Pt does housekeeping duties for job. Pt was unaware PCP sent in Rx for diflucan , but pt states she doesn't feel it's yeast infection.

## 2024-10-14 NOTE — ED Provider Notes (Signed)
 [facility]    CSN: [id number] Arrival date & time: 10/14/24  1736      History   Chief Complaint Chief Complaint  Patient presents with   Vaginal Itching   Neck Pain    HPI Pam Vargas is a 43 y.o. female.   Pam Vargas is a 43 y.o. female presenting for chief complaint of vaginal discharge, vaginal odor, and vaginal itching that started 3 days ago.  Vaginal discharge is thick and clear.  She denies vaginal rash, urinary symptoms, pelvic pain, nausea, vomiting, diarrhea, back pain, and flank pain.  Sexually active with 1 female partner unprotected in a monogamous relationship (husband).  She agrees to STD testing today.  Last menstrual cycle September 19, 2024.  Additionally reports bilateral neck pain that is worsened by movement and improves with rest.  Patient was seen in the emergency department 9 days ago for bilateral trapezius muscle spasm where she was given a prescription for ibuprofen  and Robaxin  muscle relaxer.  Denies recent trauma/injuries to the neck.  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  She denies midline neck pain.  Denies radiating pain into the chest, low back, head, and ears.  She denies paresthesias to the arms or legs.  Taking ibuprofen  with minimal relief.  Her symptoms improved significantly after her ER visit with ibuprofen  but then she went back to work and her pain started all over again.   Vaginal Itching  Neck Pain   Past Medical History:  Diagnosis Date   Advanced maternal age in multigravida    Alpha thalassemia silent carrier    Anemia    Blood transfusion without reported diagnosis    I think so with my first delivery   Cervical cerclage suture present 06/19/2020   06/19/20: ppx mcdonald cerclage with mersilene tape. Knot anteriorly.    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   SVT (supraventricular tachycardia)    Alpha thalassemia silent carrier 06/19/2020   Single nabothian cyst 06/03/2020   Benign cyst of breast 06/05/2017   TOBACCO USER 11/02/2009   Persistent cough for 3 weeks or longer 10/03/2008    Past Surgical History:  Procedure Laterality Date   CERVICAL CERCLAGE N/A 06/19/2020   Procedure: CERCLAGE CERVICAL;  Surgeon: Izell Harari, MD;  Location: [facility];  Service: Gynecology;  Laterality: N/A;   CESAREAN SECTION     FOOT SURGERY     FOOT SURGERY      OB History     Gravida  7   Para  3   Term  1   Preterm  0   AB  3   Living  3      SAB  3   IAB  0   Ectopic  0   Multiple  0   Live Births  3            Home Medications    Prior to Admission medications  Medication Sig Start Date End Date Taking? Authorizing Provider  baclofen  (LIORESAL ) 10 MG tablet Take 1 tablet (10 mg total) by mouth 3 (three) times daily. 10/14/24  Yes Enedelia Dorna HERO, FNP  methocarbamol  (ROBAXIN ) 500 MG tablet Take 1 tablet (500 mg total) by mouth 2 (two) times daily as needed. 10/05/24  Yes Bernis Ernst, PA-C  metoprolol  tartrate (LOPRESSOR ) 25 MG tablet TAKE 1/2 TABLET TWICE A DAY BY MOUTH 09/25/24  Yes Chandrasekhar, Mahesh A, MD  Multiple Vitamin (MULTIVITAMIN PO) Take by mouth.   Yes [provider]  naproxen  (NAPROSYN ) 500 MG tablet Take 1 tablet (500 mg total) by mouth 2 (two) times daily. 10/14/24  Yes Enedelia Dorna HERO, FNP  norethindrone  (MICRONOR ) 0.35 MG tablet TAKE 1 TABLET BY MOUTH EVERY DAY  03/04/24  Yes Sowell, Penne, MD  cetirizine  (ZYRTEC  ALLERGY) 10 MG tablet Take 1 tablet (10 mg total) by mouth at bedtime. 07/07/24 10/05/24  Joesph Shaver Scales, PA-C  fluconazole  (DIFLUCAN ) 150 MG tablet TAKE 1 TABLET (150 MG TOTAL) BY MOUTH ONCE FOR 1 DOSE. 10/14/24 10/14/24  Shitarev, Dimitry, MD  ibuprofen  (ADVIL ) 600 MG tablet Take 1 tablet (600 mg total) by mouth every 6 (six) hours as needed. 10/05/24   Bernis Ernst, PA-C  oxyCODONE -acetaminophen  (PERCOCET/ROXICET) 5-325 MG tablet Take 1 tablet by mouth every 6 (six) hours as needed for severe pain (pain score 7-10). 08/28/24   Zelaya, Oscar A, PA-C  triamcinolone  ointment (KENALOG ) 0.1 % Apply 1 Application topically 2 (two) times daily. Apply to affected areas of foot and left buttock twice daily, do not apply to face. 07/07/24   Joesph Shaver Scales, PA-C  valACYclovir  (VALTREX ) 1000 MG tablet Take 1 tablet (1,000 mg total) by mouth daily. 09/13/24   Shitarev, Dimitry, MD    Family History Family History  Problem Relation Age of Onset   Diabetes Mother    Hyperlipidemia Mother    Hypertension Mother    Heart disease Father    Hypertension Father    Breast cancer Maternal Aunt    Colon cancer Paternal Aunt     Social History Social History[1]   Allergies   Patient has no known allergies.   Review of Systems Review of Systems  Musculoskeletal:  Positive for neck pain.  Per HPI   Physical Exam Triage Vital Signs ED Triage Vitals  Encounter Vitals Group     BP 10/14/24 1828 (!) 121/52     Girls Systolic BP Percentile --      Girls Diastolic BP Percentile --      Boys Systolic BP Percentile --      Boys Diastolic BP Percentile --      Pulse Rate 10/14/24 1828 89     Resp 10/14/24 1828 16     Temp 10/14/24 1828 98.4 F (36.9 C)     Temp Source 10/14/24 1828 Oral     SpO2 10/14/24 1828 95 %     Weight --      Height --      Head Circumference --      Peak Flow --      Pain Score 10/14/24 1831 10     Pain Loc  --      Pain Education --      Exclude from Growth Chart --    No data found.  Updated Vital Signs BP (!) 121/52   Pulse 89   Temp 98.4 F (36.9 C) (Oral)   Resp 16   LMP 09/19/2024 (Exact Date)  SpO2 95%   Visual Acuity Right Eye Distance:   Left Eye Distance:   Bilateral Distance:    Right Eye Near:   Left Eye Near:    Bilateral Near:     Physical Exam Vitals and nursing note reviewed.  Constitutional:      Appearance: She is not ill-appearing or toxic-appearing.  HENT:     Head: Normocephalic and atraumatic.     Right Ear: Hearing and external ear normal.     Left Ear: Hearing and external ear normal.     Nose: Nose normal.     Mouth/Throat:     Lips: Pink.  Eyes:     General: Lids are normal. Vision grossly intact. Gaze aligned appropriately.     Extraocular Movements: Extraocular movements intact.     Conjunctiva/sclera: Conjunctivae normal.  Pulmonary:     Effort: Pulmonary effort is normal.  Musculoskeletal:     Cervical back: Neck supple. Swelling present. No edema, deformity, erythema, signs of trauma, lacerations, rigidity, spasms, torticollis, tenderness, bony tenderness or crepitus. Pain with movement and muscular tenderness (Tenderness to multiple points of palpation over the bilateral trapezius muscles.) present. No spinous process tenderness. Decreased range of motion.     Thoracic back: Normal.     Lumbar back: Normal.  Lymphadenopathy:     Cervical: No cervical adenopathy.  Skin:    General: Skin is warm and dry.     Capillary Refill: Capillary refill takes less than 2 seconds.     Findings: No rash.  Neurological:     General: No focal deficit present.     Mental Status: She is alert and oriented to person, place, and time. Mental status is at baseline.     GCS: GCS eye subscore is 4. GCS verbal subscore is 5. GCS motor subscore is 6.     Cranial Nerves: Cranial nerves 2-12 are intact. No dysarthria or facial asymmetry.     Sensory: Sensation  is intact.     Motor: Motor function is intact. No weakness, tremor, abnormal muscle tone or pronator drift.     Coordination: Coordination is intact. Romberg sign negative. Coordination normal. Finger-Nose-Finger Test normal.     Gait: Gait is intact.     Comments: Strength and sensation intact to bilateral upper and lower extremities (5/5). Moves all 4 extremities with normal coordination voluntarily. Non-focal neuro exam.   Psychiatric:        Mood and Affect: Mood normal.        Speech: Speech normal.        Behavior: Behavior normal.        Thought Content: Thought content normal.        Judgment: Judgment normal.      UC Treatments / Results  Labs (all labs ordered are listed, but only abnormal results are displayed) Labs Reviewed  CERVICOVAGINAL ANCILLARY ONLY    EKG   Radiology No results found.  Procedures Procedures (including critical care time)  Medications Ordered in UC Medications - No data to display  Initial Impression / Assessment and Plan / UC Course  I have reviewed the triage vital signs and the nursing notes.  Pertinent labs & imaging results that were available during my care of the patient were reviewed by me and considered in my medical decision making (see chart for details).   1.  Spasm of both trapezius muscles Reviewed ER note from evaluation 9 days ago. Evaluation suggests pain is musculoskeletal in nature. Will manage this with  rest, gentle ROM exercises, heat therapy, naproxen  as needed for pain, and as needed use of muscle relaxer. Drowsiness precautions discussed regarding muscle relaxer use. Imaging: no indication for imaging based on stable musculoskeletal exam findings and atraumatic mechanism of pain, low suspicion for acute bony abnormality.  May follow-up with orthopedics as needed.  2. Screening for STD, acute vaginitis STI labs pending, will notify patient of positive results and treat accordingly per protocol when labs result.   Patient to avoid sexual intercourse until screening testing comes back.   Education provided regarding safe sexual practices and patient encouraged to use protection to prevent spread of STIs.    Counseled patient on potential for adverse effects with medications prescribed/recommended today, strict ER and return-to-clinic precautions discussed, patient verbalized understanding.    Final Clinical Impressions(s) / UC Diagnoses   Final diagnoses:  Screening for STD (sexually transmitted disease)  Spasm of both trapezius muscles  Acute vaginitis     Discharge Instructions      Your symptoms are likely due to a pulled muscle.  Pulled muscles typically heal on their own in several days.  You may take tylenol  as needed for aches and pains.  Take muscle relaxer as needed for muscle spasm, mostly take this at bedtime as this medicine can cause drowsiness.  Apply heat to the pulled muscle 20 minutes on 20 minutes off as needed, heat relaxes muscles.  Perform gentle exercises and stretches to area of tenderness.  I would like for you to rest, however I do not want you to avoid moving the area. Movement and stretching will help with healing.  If you develop any new or worsening symptoms or if your symptoms do not start to improve, please return here or follow-up with your primary care provider. If your symptoms are severe, please go to the emergency room.   STD testing pending, this will take 2-3 days to result. We will only call you if your testing is positive for any infection(s) and we will provide treatment.  Avoid sexual intercourse until your STD results come back.  If any of your STD results are positive, you will need to avoid sexual intercourse for 7 days while you are being treated to prevent spread of STD.  Condom use is the best way to prevent spread of STDs. Notify partner(s) of any positive results.  Return to urgent care as needed.      ED Prescriptions      Medication Sig Dispense Auth. Provider   naproxen  (NAPROSYN ) 500 MG tablet Take 1 tablet (500 mg total) by mouth 2 (two) times daily. 30 tablet Adryanna Friedt M, FNP   baclofen  (LIORESAL ) 10 MG tablet Take 1 tablet (10 mg total) by mouth 3 (three) times daily. 30 each Enedelia Dorna HERO, FNP      PDMP not reviewed this encounter.    [1]  Social History Tobacco Use   Smoking status: Every Day    Current packs/day: 0.00    Average packs/day: 0.3 packs/day    Types: Cigarettes    Last attempt to quit: 05/29/2020    Years since quitting: 4.3    Passive exposure: Current   Smokeless tobacco: Never   Tobacco comments:    6 cigarettes per day  Vaping Use   Vaping status: Never Used  Substance Use Topics   Alcohol use: Yes    Comment: occasionally   Drug use: No     Enedelia Dorna HERO, FNP 10/14/24 1957

## 2024-10-14 NOTE — Telephone Encounter (Signed)
 Called pharmacy. Patient picked up prescription on 11/14.  Called patient. Patient made request for diflucan  as she feels that she has developed another yeast infection.   She reports vaginal itching and odor with  discharge for the last 2 days.   Advised patient that I would reach out to PCP regarding refill vs scheduling follow up visit.   Chiquita JAYSON English, RN

## 2024-10-15 ENCOUNTER — Ambulatory Visit (HOSPITAL_COMMUNITY): Payer: Self-pay

## 2024-10-15 LAB — CERVICOVAGINAL ANCILLARY ONLY
Bacterial Vaginitis (gardnerella): POSITIVE — AB
Candida Glabrata: NEGATIVE
Candida Vaginitis: NEGATIVE
Chlamydia: NEGATIVE
Comment: NEGATIVE
Comment: NEGATIVE
Comment: NEGATIVE
Comment: NEGATIVE
Comment: NEGATIVE
Comment: NORMAL
Neisseria Gonorrhea: NEGATIVE
Trichomonas: NEGATIVE

## 2024-10-15 MED ORDER — METRONIDAZOLE 500 MG PO TABS: 500.0000 mg | ORAL_TABLET | Freq: Two times a day (BID) | ORAL | 0 refills | Status: AC

## 2024-10-25 ENCOUNTER — Other Ambulatory Visit: Payer: Self-pay | Admitting: Internal Medicine

## 2024-10-28 ENCOUNTER — Ambulatory Visit

## 2024-10-28 VITALS — Ht 64.0 in | Wt 116.4 lb

## 2024-10-28 DIAGNOSIS — Z1211 Encounter for screening for malignant neoplasm of colon: Secondary | ICD-10-CM

## 2024-10-28 MED ORDER — PEG 3350-KCL-NA BICARB-NACL 420 G PO SOLR: 4000.0000 mL | Freq: Once | ORAL | 0 refills | Status: AC

## 2024-10-28 NOTE — Progress Notes (Signed)

## 2024-11-10 NOTE — Progress Notes (Unsigned)
 [facility] History and Physical   Primary Care Physician:  Toma Matas, MD   Reason for Procedure:  Colorectal cancer screening  Plan:    Screening colonoscopy   The patient was provided an opportunity to ask questions and all were answered. The patient agreed with the plan.   HPI: Pam Vargas is a 44 y.o. female undergoing screening colonoscopy for colorectal cancer screening.  This is the patient's first colonoscopy.  Chart review documents family history of colorectal cancer in a paternal aunt but no immediate family members.  Patient denies current symptoms of rectal bleeding or change in bowel habits.   Past Medical History:  Diagnosis Date   Advanced maternal age in multigravida    Alpha thalassemia silent carrier    Anemia    Blood transfusion without reported diagnosis    I think so with my first delivery   Cervical cerclage suture present 06/19/2020   06/19/20: ppx mcdonald cerclage with mersilene tape. Knot anteriorly.    Cervical mass 04/2020   Preterm labor    PROM (premature rupture of membranes) 08/28/2020   Sinus tachycardia     Past Surgical History:  Procedure Laterality Date   CERVICAL CERCLAGE N/A 06/19/2020   Procedure: CERCLAGE CERVICAL;  Surgeon: Izell Harari, MD;  Location: [facility]S;  Service: Gynecology;  Laterality: N/A;   CESAREAN SECTION     FOOT SURGERY     FOOT SURGERY      Prior to Admission medications  Medication Sig Start Date End Date Taking? Authorizing Provider  baclofen  (LIORESAL ) 10 MG tablet Take 1 tablet (10 mg total) by mouth 3 (three) times daily. 10/14/24   Enedelia Dorna HERO, FNP  cetirizine  (ZYRTEC  ALLERGY) 10 MG tablet Take 1 tablet (10 mg total) by mouth at bedtime. 07/07/24 10/28/24  Joesph Shaver Scales, PA-C  ibuprofen  (ADVIL ) 600 MG tablet Take 1 tablet (600 mg total) by mouth every 6 (six) hours as needed. 10/05/24   Bernis Ernst, PA-C  methocarbamol  (ROBAXIN ) 500 MG tablet Take 1 tablet  (500 mg total) by mouth 2 (two) times daily as needed. 10/05/24   Bernis Ernst, PA-C  metoprolol  tartrate (LOPRESSOR ) 25 MG tablet Take 0.5 tablets (12.5 mg total) by mouth 2 (two) times daily. Pt needs to schedule appt with provider for further refills - 1st attempt 10/25/24   Santo Stanly LABOR, MD  Multiple Vitamin (MULTIVITAMIN PO) Take by mouth.    [provider]  naproxen  (NAPROSYN ) 500 MG tablet Take 1 tablet (500 mg total) by mouth 2 (two) times daily. 10/14/24   Enedelia Dorna HERO, FNP  norethindrone  (MICRONOR ) 0.35 MG tablet TAKE 1 TABLET BY MOUTH EVERY DAY 03/04/24   Jennelle Riis, MD  oxyCODONE -acetaminophen  (PERCOCET/ROXICET) 5-325 MG tablet Take 1 tablet by mouth every 6 (six) hours as needed for severe pain (pain score 7-10). 08/28/24   Zelaya, Oscar A, PA-C  triamcinolone  ointment (KENALOG ) 0.1 % Apply 1 Application topically 2 (two) times daily. Apply to affected areas of foot and left buttock twice daily, do not apply to face. 07/07/24   Joesph Shaver Scales, PA-C  valACYclovir  (VALTREX ) 1000 MG tablet Take 1 tablet (1,000 mg total) by mouth daily. 09/13/24   Shitarev, Dimitry, MD    Current Outpatient Medications  Medication Sig Dispense Refill   metoprolol  tartrate (LOPRESSOR ) 25 MG tablet Take 0.5 tablets (12.5 mg total) by mouth 2 (two) times daily. Pt needs to schedule appt with provider for further refills - 1st attempt 30 tablet 0  Multiple Vitamin (MULTIVITAMIN PO) Take by mouth.     norethindrone  (MICRONOR ) 0.35 MG tablet TAKE 1 TABLET BY MOUTH EVERY DAY 84 tablet 3   cetirizine  (ZYRTEC  ALLERGY) 10 MG tablet Take 1 tablet (10 mg total) by mouth at bedtime. 30 tablet 2   ibuprofen  (ADVIL ) 600 MG tablet Take 1 tablet (600 mg total) by mouth every 6 (six) hours as needed. 30 tablet 0   methocarbamol  (ROBAXIN ) 500 MG tablet Take 1 tablet (500 mg total) by mouth 2 (two) times daily as needed. 10 tablet 0   naproxen  (NAPROSYN ) 500 MG tablet Take 1 tablet (500  mg total) by mouth 2 (two) times daily. 30 tablet 0   oxyCODONE -acetaminophen  (PERCOCET/ROXICET) 5-325 MG tablet Take 1 tablet by mouth every 6 (six) hours as needed for severe pain (pain score 7-10). 6 tablet 0   triamcinolone  ointment (KENALOG ) 0.1 % Apply 1 Application topically 2 (two) times daily. Apply to affected areas of foot and left buttock twice daily, do not apply to face. 30 g 1   valACYclovir  (VALTREX ) 1000 MG tablet Take 1 tablet (1,000 mg total) by mouth daily. 90 tablet 1   Current Facility-Administered Medications  Medication Dose Route Frequency Provider Last Rate Last Admin   0.9 %  sodium chloride  infusion  500 mL Intravenous Continuous Layna Roeper, Inocente HERO, MD        Allergies as of 11/11/2024   (No Known Allergies)    Family History  Problem Relation Age of Onset   Diabetes Mother    Hyperlipidemia Mother    Hypertension Mother    Heart disease Father    Hypertension Father    Breast cancer Maternal Aunt    Colon cancer Paternal Aunt    Rectal cancer Neg Hx    Esophageal cancer Neg Hx     Social History   Socioeconomic History   Marital status: Married    Spouse name: Not on file   Number of children: Not on file   Years of education: Not on file   Highest education level: Not on file  Occupational History   Not on file  Tobacco Use   Smoking status: Every Day    Current packs/day: 0.00    Average packs/day: 0.3 packs/day    Types: Cigarettes    Last attempt to quit: 05/29/2020    Years since quitting: 4.4    Passive exposure: Current   Smokeless tobacco: Never   Tobacco comments:    6 cigarettes per day  Vaping Use   Vaping status: Never Used  Substance and Sexual Activity   Alcohol use: Yes    Comment: occasionally   Drug use: No   Sexual activity: Yes    Partners: Male    Birth control/protection: Pill    Comment: menarche 44yo, sexual debut 44yo  Other Topics Concern   Not on file  Social History Narrative   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, applications    Social Drivers of Health   Tobacco Use: High Risk (11/11/2024)   Patient History    Smoking Tobacco Use: Every Day    Smokeless Tobacco Use: Never    Passive Exposure: Current  Financial Resource Strain: Not on file  Food Insecurity: No Food Insecurity (12/27/2021)   Hunger Vital Sign    Worried About Running Out of Food in the Last Year: Never true    Ran Out of Food in the Last Year: Never true  Transportation Needs: No Transportation Needs (  12/27/2021)   PRAPARE - Administrator, Civil Service (Medical): No    Lack of Transportation (Non-Medical): No  Physical Activity: Not on file  Stress: Not on file  Social Connections: Not on file  Intimate Partner Violence: Not on file  Depression (PHQ2-9): Low Risk (08/03/2023)   Depression (PHQ2-9)    PHQ-2 Score: 0  Alcohol Screen: Not on file  Housing: Low Risk (12/27/2021)   Housing    Last Housing Risk Score: 0  Utilities: Not on file  Health Literacy: Not on file    Review of Systems:  All other review of systems negative except as mentioned in the HPI.  Physical Exam: Vital signs BP 99/68   Pulse 77   Temp 98 F (36.7 C)   Ht 5' 4 (1.626 m)   Wt 116 lb 6.4 oz (52.8 kg)   LMP 09/19/2024 (Exact Date)   SpO2 100%   BMI 19.98 kg/m   General:   Alert,  Well-developed, well-nourished, pleasant and cooperative in NAD Airway:  Mallampati 2 Lungs:  Clear throughout to auscultation.   Heart:  Regular rate and rhythm; no murmurs, clicks, rubs,  or gallops. Abdomen:  Soft, nontender and nondistended. Normal bowel sounds.   Neuro/Psych:  Normal mood and affect. A and O x 3  Inocente Hausen, MD [facility]

## 2024-11-11 ENCOUNTER — Ambulatory Visit: Admitting: Pediatrics

## 2024-11-11 ENCOUNTER — Encounter: Payer: Self-pay | Admitting: Pediatrics

## 2024-11-11 VITALS — BP 111/60 | HR 61 | Temp 98.0°F | Resp 13 | Ht 64.0 in | Wt 116.4 lb

## 2024-11-11 DIAGNOSIS — D128 Benign neoplasm of rectum: Secondary | ICD-10-CM

## 2024-11-11 DIAGNOSIS — Z1211 Encounter for screening for malignant neoplasm of colon: Secondary | ICD-10-CM | POA: Diagnosis not present

## 2024-11-11 DIAGNOSIS — K621 Rectal polyp: Secondary | ICD-10-CM | POA: Diagnosis not present

## 2024-11-11 DIAGNOSIS — Z800 Family history of malignant neoplasm of digestive organs: Secondary | ICD-10-CM | POA: Diagnosis not present

## 2024-11-11 MED ORDER — SODIUM CHLORIDE 0.9 % IV SOLN: 500.0000 mL | INTRAVENOUS | Status: DC

## 2024-11-11 NOTE — Progress Notes (Signed)
 Called to room to assist during endoscopic procedure.  Patient ID and intended procedure confirmed with present staff. Received instructions for my participation in the procedure from the performing physician.

## 2024-11-11 NOTE — Patient Instructions (Addendum)
 Repeat colonoscopy for surveillance based on                            pathology results.                           - The findings and recommendations were discussed                            with the patient's family.                           - Patient has a contact number available for                            emergencies. The signs and symptoms of potential                            delayed complications were discussed with the                            patient. Return to normal activities tomorrow.                            Written discharge instructions were provided to the                            patient.  Handout on polyps given.    YOU HAD AN ENDOSCOPIC PROCEDURE TODAY AT THE [facility]:   Refer to the procedure report that was given to you for any specific questions about what was found during the examination.  If the procedure report does not answer your questions, please call your gastroenterologist to clarify.  If you requested that your care partner not be given the details of your procedure findings, then the procedure report has been included in a sealed envelope for you to review at your convenience later.  YOU SHOULD EXPECT: Some feelings of bloating in the abdomen. Passage of more gas than usual.  Walking can help get rid of the air that was put into your GI tract during the procedure and reduce the bloating. If you had a lower endoscopy (such as a colonoscopy or flexible sigmoidoscopy) you may notice spotting of blood in your stool or on the toilet paper. If you underwent a bowel prep for your procedure, you may not have a normal bowel movement for a few days.  Please Note:  You might notice some irritation and congestion in your nose or some drainage.  This is from the oxygen used during your procedure.  There is no need for concern and it should clear up in a day or so.  SYMPTOMS TO REPORT IMMEDIATELY:  Following lower endoscopy (colonoscopy or  flexible sigmoidoscopy):  Excessive amounts of blood in the stool  Significant tenderness or worsening of abdominal pains  Swelling of the abdomen that is new, acute  Fever of 100F or higher   For urgent or emergent issues, a gastroenterologist can be reached at any hour by calling [phone]. Do not use MyChart messaging for urgent concerns.    DIET:  We do  recommend a small meal at first, but then you may proceed to your regular diet.  Drink plenty of fluids but you should avoid alcoholic beverages for 24 hours.  ACTIVITY:  You should plan to take it easy for the rest of today and you should NOT DRIVE or use heavy machinery until tomorrow (because of the sedation medicines used during the test).    FOLLOW UP: Our staff will call the number listed on your records the next business day following your procedure.  We will call around 7:15- 8:00 am to check on you and address any questions or concerns that you may have regarding the information given to you following your procedure. If we do not reach you, we will leave a message.     If any biopsies were taken you will be contacted by phone or by letter within the next 1-3 weeks.  Please call us  at [phone] if you have not heard about the biopsies in 3 weeks.    SIGNATURES/CONFIDENTIALITY: You and/or your care partner have signed paperwork which will be entered into your electronic medical record.  These signatures attest to the fact that that the information above on your After Visit Summary has been reviewed and is understood.  Full responsibility of the confidentiality of this discharge information lies with you and/or your care-partner.

## 2024-11-11 NOTE — Op Note (Signed)
 [facility] Patient Name: Pam Vargas Procedure Date: 11/11/2024 7:28 AM MRN: [id number] Endoscopist: Inocente Hausen , MD, [id number] Age: 44 Referring MD:  Date of Birth: 08-May-1981 Gender: Female Account #: [id number] Procedure:                Colonoscopy Indications:              Screening for colorectal malignant neoplasm, Family                            history of colon cancer in a distant relative Medicines:                Monitored Anesthesia Care Procedure:                Pre-Anesthesia Assessment:                           - Prior to the procedure, a History and Physical                            was performed, and patient medications and                            allergies were reviewed. The patient's tolerance of                            previous anesthesia was also reviewed. The risks                            and benefits of the procedure and the sedation                            options and risks were discussed with the patient.                            All questions were answered, and informed consent                            was obtained. Prior Anticoagulants: The patient has                            taken no anticoagulant or antiplatelet agents. ASA                            Grade Assessment: II - A patient with mild systemic                            disease. After reviewing the risks and benefits,                            the patient was deemed in satisfactory condition to                            undergo the procedure.  After obtaining informed consent, the colonoscope                            was passed under direct vision. Throughout the                            procedure, the patient's blood pressure, pulse, and                            oxygen saturations were monitored continuously. The                            Olympus Scope [id number] was introduced through the                            anus  and advanced to the cecum, identified by                            appendiceal orifice and ileocecal valve. The                            colonoscopy was performed without difficulty. The                            patient tolerated the procedure well. The quality                            of the bowel preparation was good. The ileocecal                            valve, appendiceal orifice, and rectum were                            photographed. Scope In: 8:01:19 AM Scope Out: 8:16:57 AM Scope Withdrawal Time: 0 hours 10 minutes 27 seconds  Total Procedure Duration: 0 hours 15 minutes 38 seconds  Findings:                 The perianal and digital rectal examinations were                            normal. Pertinent negatives include normal                            sphincter tone and no palpable rectal lesions.                           Four sessile polyps were found in the rectum. The                            polyps were 3 to 4 mm in size. These polyps were                            removed with a cold biopsy forceps. Resection and  retrieval were complete.                           The retroflexed view of the distal rectum and anal                            verge was normal and showed no anal or rectal                            abnormalities. Complications:            No immediate complications. Estimated blood loss:                            Minimal. Estimated Blood Loss:     Estimated blood loss was minimal. Impression:               - Four 3 to 4 mm polyps in the rectum, removed with                            a cold biopsy forceps. Resected and retrieved.                           - The distal rectum and anal verge are normal on                            retroflexion view. Recommendation:           - Discharge patient to home (ambulatory).                           - Await pathology results.                           - Repeat colonoscopy for  surveillance based on                            pathology results.                           - The findings and recommendations were discussed                            with the patient's family.                           - Patient has a contact number available for                            emergencies. The signs and symptoms of potential                            delayed complications were discussed with the                            patient. Return to normal activities tomorrow.  Written discharge instructions were provided to the                            patient. Inocente Hausen, MD 11/11/2024 8:21:00 AM This report has been signed electronically.

## 2024-11-12 ENCOUNTER — Telehealth: Payer: Self-pay

## 2024-11-12 NOTE — Telephone Encounter (Signed)
" °  Follow up Call-     11/11/2024    7:14 AM  Call back number  Post procedure Call Back phone  # [phone]  Permission to leave phone message Yes     Patient questions:  Do you have a fever, pain , or abdominal swelling? No. Pain Score  0 *  Have you tolerated food without any problems? Yes.    Have you been able to return to your normal activities? Yes.    Do you have any questions about your discharge instructions: Diet   No. Medications  No. Follow up visit  No.  Do you have questions or concerns about your Care? No.  Actions: * If pain score is 4 or above: No action needed, pain <4.   "

## 2024-11-14 ENCOUNTER — Ambulatory Visit: Payer: Self-pay | Admitting: Pediatrics

## 2024-11-14 LAB — SURGICAL PATHOLOGY

## 2024-11-21 ENCOUNTER — Other Ambulatory Visit: Payer: Self-pay | Admitting: Internal Medicine

## 2024-11-21 ENCOUNTER — Telehealth: Payer: Self-pay | Admitting: Internal Medicine

## 2024-11-21 NOTE — Telephone Encounter (Signed)
" °*  STAT* If patient is at the pharmacy, call can be transferred to refill team.   1. Which medications need to be refilled? (please list name of each medication and dose if known)   metoprolol  tartrate (LOPRESSOR ) 25 MG tablet     2. Would you like to learn more about the convenience, safety, & potential cost savings by using the [facility]? No    3. Are you open to using the [facility] (Type [facility]. ). No    4. Which pharmacy/location (including street and city if local pharmacy) is medication to be sent to? [id number] - [place], [place] - [address]     5. Do they need a 30 day or 90 day supply? 90  "

## 2024-11-22 NOTE — Telephone Encounter (Signed)
 Pt calling back in to check up. Pt scheduled for 4/7 f/u.

## 2025-02-04 ENCOUNTER — Ambulatory Visit: Admitting: Internal Medicine
